# Patient Record
Sex: Female | Born: 1935 | Race: White | Hispanic: No | Smoking: Former smoker
Health system: Southern US, Community
[De-identification: ages and names within clinical notes are randomized; demographics above are authoritative.]

## PROBLEM LIST (undated history)

## (undated) DIAGNOSIS — I7300 Raynaud's syndrome without gangrene: Secondary | ICD-10-CM

## (undated) DIAGNOSIS — K8020 Calculus of gallbladder without cholecystitis without obstruction: Secondary | ICD-10-CM

## (undated) DIAGNOSIS — J90 Pleural effusion, not elsewhere classified: Secondary | ICD-10-CM

## (undated) DIAGNOSIS — M109 Gout, unspecified: Secondary | ICD-10-CM

## (undated) DIAGNOSIS — C801 Malignant (primary) neoplasm, unspecified: Secondary | ICD-10-CM

## (undated) DIAGNOSIS — C539 Malignant neoplasm of cervix uteri, unspecified: Secondary | ICD-10-CM

## (undated) DIAGNOSIS — H9190 Unspecified hearing loss, unspecified ear: Secondary | ICD-10-CM

## (undated) DIAGNOSIS — M810 Age-related osteoporosis without current pathological fracture: Secondary | ICD-10-CM

## (undated) DIAGNOSIS — E119 Type 2 diabetes mellitus without complications: Secondary | ICD-10-CM

## (undated) DIAGNOSIS — J449 Chronic obstructive pulmonary disease, unspecified: Secondary | ICD-10-CM

## (undated) DIAGNOSIS — Z9981 Dependence on supplemental oxygen: Secondary | ICD-10-CM

## (undated) HISTORY — PX: ORTHOPEDIC SURGERY: SHX850

## (undated) HISTORY — PX: OTHER SURGICAL HISTORY: SHX169

## (undated) HISTORY — PX: COLON SURGERY: SHX602

## (undated) HISTORY — DX: Type 2 diabetes mellitus without complications: E11.9

## (undated) HISTORY — DX: Raynaud's syndrome without gangrene: I73.00

## (undated) HISTORY — PX: ABDOMINAL HYSTERECTOMY: SHX81

---

## 2010-10-09 ENCOUNTER — Inpatient Hospital Stay (HOSPITAL_COMMUNITY)
Admission: EM | Admit: 2010-10-09 | Discharge: 2010-10-12 | Payer: Self-pay | Source: Home / Self Care | Attending: Internal Medicine | Admitting: Internal Medicine

## 2011-01-01 LAB — BASIC METABOLIC PANEL
BUN: 14 mg/dL (ref 6–23)
BUN: 21 mg/dL (ref 6–23)
CO2: 36 mEq/L — ABNORMAL HIGH (ref 19–32)
Calcium: 8.4 mg/dL (ref 8.4–10.5)
Chloride: 98 mEq/L (ref 96–112)
Creatinine, Ser: 0.93 mg/dL (ref 0.4–1.2)
Creatinine, Ser: 1.05 mg/dL (ref 0.4–1.2)
GFR calc Af Amer: >60 mL/min (ref >60)
GFR calc non Af Amer: 58 mL/min — ABNORMAL LOW (ref >60)
Glucose, Bld: 101 mg/dL — ABNORMAL HIGH (ref 70–99)
Glucose, Bld: 101 mg/dL — ABNORMAL HIGH (ref 70–99)
Glucose, Bld: 104 mg/dL — ABNORMAL HIGH (ref 70–99)
Potassium: 4.8 mEq/L (ref 3.5–5.1)
Sodium: 138 mEq/L (ref 135–145)

## 2011-01-01 LAB — COMPREHENSIVE METABOLIC PANEL
AST: 18 U/L (ref 0–37)
Albumin: 3.7 g/dL (ref 3.5–5.2)
BUN: 27 mg/dL — ABNORMAL HIGH (ref 6–23)
CO2: 33 mEq/L — ABNORMAL HIGH (ref 19–32)
Chloride: 92 mEq/L — ABNORMAL LOW (ref 96–112)
GFR calc non Af Amer: 42 mL/min — ABNORMAL LOW (ref >60)
Glucose, Bld: 118 mg/dL — ABNORMAL HIGH (ref 70–99)
Sodium: 137 mEq/L (ref 135–145)
Total Protein: 7.7 g/dL (ref 6.0–8.3)

## 2011-01-01 LAB — GLUCOSE, CAPILLARY
Glucose-Capillary: 103 mg/dL — ABNORMAL HIGH (ref 70–99)
Glucose-Capillary: 104 mg/dL — ABNORMAL HIGH (ref 70–99)
Glucose-Capillary: 105 mg/dL — ABNORMAL HIGH (ref 70–99)
Glucose-Capillary: 108 mg/dL — ABNORMAL HIGH (ref 70–99)
Glucose-Capillary: 110 mg/dL — ABNORMAL HIGH (ref 70–99)
Glucose-Capillary: 119 mg/dL — ABNORMAL HIGH (ref 70–99)
Glucose-Capillary: 132 mg/dL — ABNORMAL HIGH (ref 70–99)

## 2011-01-01 LAB — CBC
HCT: 46.1 % — ABNORMAL HIGH (ref 36.0–46.0)
HCT: 47.1 % — ABNORMAL HIGH (ref 36.0–46.0)
Hemoglobin: 14.5 g/dL (ref 12.0–15.0)
MCH: 29.2 pg (ref 26.0–34.0)
MCH: 29.3 pg (ref 26.0–34.0)
MCHC: 30.7 g/dL (ref 30.0–36.0)
MCV: 94.7 fL (ref 78.0–100.0)
MCV: 95.1 fL (ref 78.0–100.0)
MCV: 95.2 fL (ref 78.0–100.0)
Platelets: 140 10*3/uL — ABNORMAL LOW (ref 150–400)
Platelets: 144 10*3/uL — ABNORMAL LOW (ref 150–400)
Platelets: 173 10*3/uL (ref 150–400)
RDW: 15 % (ref 11.5–15.5)
RDW: 15.1 % (ref 11.5–15.5)
WBC: 7.2 10*3/uL (ref 4.0–10.5)
WBC: 7.8 10*3/uL (ref 4.0–10.5)

## 2011-01-01 LAB — URINE CULTURE
Colony Count: NO GROWTH
Culture: NO GROWTH

## 2011-01-01 LAB — CULTURE, RESPIRATORY W GRAM STAIN: Culture: NORMAL

## 2011-01-01 LAB — DIFFERENTIAL
Basophils Absolute: 0 10*3/uL (ref 0.0–0.1)
Eosinophils Absolute: 0.2 10*3/uL (ref 0.0–0.7)

## 2011-01-01 LAB — URINALYSIS, ROUTINE W REFLEX MICROSCOPIC
Glucose, UA: NEGATIVE mg/dL
Protein, ur: NEGATIVE mg/dL
Specific Gravity, Urine: 1.019 (ref 1.005–1.030)
Urobilinogen, UA: 1 mg/dL (ref 0.0–1.0)
pH: 5 (ref 5.0–8.0)

## 2011-01-01 LAB — HEMOGLOBIN A1C
Hgb A1c MFr Bld: 6.8 % — ABNORMAL HIGH (ref <5.7)
Mean Plasma Glucose: 148 mg/dL — ABNORMAL HIGH (ref <117)

## 2011-01-01 LAB — URINE MICROSCOPIC-ADD ON

## 2011-01-01 LAB — MAGNESIUM: Magnesium: 1.8 mg/dL (ref 1.5–2.5)

## 2012-08-20 ENCOUNTER — Encounter (HOSPITAL_COMMUNITY): Payer: Self-pay | Admitting: *Deleted

## 2012-08-20 ENCOUNTER — Emergency Department (HOSPITAL_COMMUNITY): Payer: Medicare Other

## 2012-08-20 ENCOUNTER — Inpatient Hospital Stay (HOSPITAL_COMMUNITY)
Admission: EM | Admit: 2012-08-20 | Discharge: 2012-08-22 | DRG: 194 | Disposition: A | Discharge status: Home / Self Care | Payer: Medicare Other | Attending: Internal Medicine | Admitting: Internal Medicine

## 2012-08-20 DIAGNOSIS — Z87891 Personal history of nicotine dependence: Secondary | ICD-10-CM

## 2012-08-20 DIAGNOSIS — J9621 Acute and chronic respiratory failure with hypoxia: Secondary | ICD-10-CM | POA: Diagnosis present

## 2012-08-20 DIAGNOSIS — J9610 Chronic respiratory failure, unspecified whether with hypoxia or hypercapnia: Secondary | ICD-10-CM

## 2012-08-20 DIAGNOSIS — J449 Chronic obstructive pulmonary disease, unspecified: Secondary | ICD-10-CM | POA: Diagnosis present

## 2012-08-20 DIAGNOSIS — E860 Dehydration: Secondary | ICD-10-CM | POA: Diagnosis present

## 2012-08-20 DIAGNOSIS — Z6831 Body mass index (BMI) 31.0-31.9, adult: Secondary | ICD-10-CM

## 2012-08-20 DIAGNOSIS — E119 Type 2 diabetes mellitus without complications: Secondary | ICD-10-CM | POA: Diagnosis present

## 2012-08-20 DIAGNOSIS — E785 Hyperlipidemia, unspecified: Secondary | ICD-10-CM | POA: Diagnosis present

## 2012-08-20 DIAGNOSIS — Z8541 Personal history of malignant neoplasm of cervix uteri: Secondary | ICD-10-CM

## 2012-08-20 DIAGNOSIS — J441 Chronic obstructive pulmonary disease with (acute) exacerbation: Secondary | ICD-10-CM | POA: Diagnosis present

## 2012-08-20 DIAGNOSIS — Z79899 Other long term (current) drug therapy: Secondary | ICD-10-CM

## 2012-08-20 DIAGNOSIS — E669 Obesity, unspecified: Secondary | ICD-10-CM | POA: Diagnosis present

## 2012-08-20 DIAGNOSIS — E875 Hyperkalemia: Secondary | ICD-10-CM | POA: Diagnosis present

## 2012-08-20 DIAGNOSIS — J189 Pneumonia, unspecified organism: Principal | ICD-10-CM | POA: Diagnosis present

## 2012-08-20 DIAGNOSIS — Z7982 Long term (current) use of aspirin: Secondary | ICD-10-CM

## 2012-08-20 DIAGNOSIS — M810 Age-related osteoporosis without current pathological fracture: Secondary | ICD-10-CM | POA: Diagnosis present

## 2012-08-20 DIAGNOSIS — Z90710 Acquired absence of both cervix and uterus: Secondary | ICD-10-CM

## 2012-08-20 DIAGNOSIS — Z85038 Personal history of other malignant neoplasm of large intestine: Secondary | ICD-10-CM

## 2012-08-20 DIAGNOSIS — Z9981 Dependence on supplemental oxygen: Secondary | ICD-10-CM

## 2012-08-20 HISTORY — DX: Malignant neoplasm of cervix uteri, unspecified: C53.9

## 2012-08-20 HISTORY — DX: Chronic obstructive pulmonary disease, unspecified: J44.9

## 2012-08-20 HISTORY — DX: Malignant (primary) neoplasm, unspecified: C80.1

## 2012-08-20 HISTORY — DX: Age-related osteoporosis without current pathological fracture: M81.0

## 2012-08-20 LAB — CBC WITH DIFFERENTIAL/PLATELET
Eosinophils Relative: 0 % (ref 0–5)
HCT: 41.7 % (ref 36.0–46.0)
Hemoglobin: 13.1 g/dL (ref 12.0–15.0)
Lymphocytes Relative: 5 % — ABNORMAL LOW (ref 12–46)
Lymphs Abs: 0.5 10*3/uL — ABNORMAL LOW (ref 0.7–4.0)
MCV: 86.7 fL (ref 78.0–100.0)
Monocytes Relative: 6 % (ref 3–12)
Platelets: 146 10*3/uL — ABNORMAL LOW (ref 150–400)
RBC: 4.81 MIL/uL (ref 3.87–5.11)
WBC: 9.9 10*3/uL (ref 4.0–10.5)

## 2012-08-20 LAB — COMPREHENSIVE METABOLIC PANEL
ALT: 10 U/L (ref 0–35)
Alkaline Phosphatase: 88 U/L (ref 39–117)
BUN: 27 mg/dL — ABNORMAL HIGH (ref 6–23)
CO2: 31 mEq/L (ref 19–32)
Calcium: 9.8 mg/dL (ref 8.4–10.5)
GFR calc Af Amer: 52 mL/min — ABNORMAL LOW (ref >90)
GFR calc non Af Amer: 45 mL/min — ABNORMAL LOW (ref >90)
Glucose, Bld: 147 mg/dL — ABNORMAL HIGH (ref 70–99)
Sodium: 132 mEq/L — ABNORMAL LOW (ref 135–145)

## 2012-08-20 LAB — HEPATIC FUNCTION PANEL
ALT: 11 U/L (ref 0–35)
Alkaline Phosphatase: 80 U/L (ref 39–117)
Indirect Bilirubin: 0.4 mg/dL (ref 0.3–0.9)
Total Bilirubin: 0.6 mg/dL (ref 0.3–1.2)
Total Protein: 8.1 g/dL (ref 6.0–8.3)

## 2012-08-20 MED ORDER — ALBUTEROL SULFATE (5 MG/ML) 0.5% IN NEBU
INHALATION_SOLUTION | RESPIRATORY_TRACT | Status: AC
  Filled 2012-08-20: qty 0.5

## 2012-08-20 MED ORDER — ACETAMINOPHEN 325 MG PO TABS: 650.0000 mg | ORAL_TABLET | ORAL | Status: DC | PRN

## 2012-08-20 MED ORDER — IPRATROPIUM BROMIDE 0.02 % IN SOLN
0.5000 mg | Freq: Once | RESPIRATORY_TRACT | Status: AC
  Administered 2012-08-20: 0.5 mg via RESPIRATORY_TRACT
  Filled 2012-08-20: qty 2.5

## 2012-08-20 MED ORDER — FLEET ENEMA 7-19 GM/118ML RE ENEM: 1.0000 | ENEMA | Freq: Once | RECTAL | Status: AC | PRN

## 2012-08-20 MED ORDER — INSULIN ASPART 100 UNIT/ML ~~LOC~~ SOLN: 0.0000 [IU] | Freq: Every day | SUBCUTANEOUS | Status: DC

## 2012-08-20 MED ORDER — MOXIFLOXACIN HCL IN NACL 400 MG/250ML IV SOLN
400.0000 mg | INTRAVENOUS | Status: DC
  Administered 2012-08-21: 400 mg via INTRAVENOUS
  Filled 2012-08-20: qty 250

## 2012-08-20 MED ORDER — TRAZODONE HCL 50 MG PO TABS: 25.0000 mg | ORAL_TABLET | Freq: Every evening | ORAL | Status: DC | PRN

## 2012-08-20 MED ORDER — METHYLPREDNISOLONE SODIUM SUCC 125 MG IJ SOLR
125.0000 mg | Freq: Once | INTRAMUSCULAR | Status: AC
  Administered 2012-08-20: 125 mg via INTRAVENOUS
  Filled 2012-08-20: qty 2

## 2012-08-20 MED ORDER — ALBUTEROL SULFATE (5 MG/ML) 0.5% IN NEBU
5.0000 mg | INHALATION_SOLUTION | Freq: Once | RESPIRATORY_TRACT | Status: AC
  Administered 2012-08-20: 5 mg via RESPIRATORY_TRACT
  Filled 2012-08-20: qty 1

## 2012-08-20 MED ORDER — IPRATROPIUM BROMIDE 0.02 % IN SOLN
0.5000 mg | RESPIRATORY_TRACT | Status: DC
  Administered 2012-08-20 – 2012-08-21 (×3): 0.5 mg via RESPIRATORY_TRACT
  Filled 2012-08-20 (×3): qty 2.5

## 2012-08-20 MED ORDER — NIACIN ER (ANTIHYPERLIPIDEMIC) 500 MG PO TBCR
1500.0000 mg | EXTENDED_RELEASE_TABLET | Freq: Two times a day (BID) | ORAL | Status: DC
  Administered 2012-08-21 – 2012-08-22 (×3): 1500 mg via ORAL
  Filled 2012-08-20 (×9): qty 3

## 2012-08-20 MED ORDER — ALBUTEROL SULFATE (5 MG/ML) 0.5% IN NEBU
2.5000 mg | INHALATION_SOLUTION | RESPIRATORY_TRACT | Status: DC
  Administered 2012-08-20 – 2012-08-21 (×2): 2.5 mg via RESPIRATORY_TRACT
  Filled 2012-08-20: qty 0.5

## 2012-08-20 MED ORDER — IPRATROPIUM BROMIDE 0.02 % IN SOLN
RESPIRATORY_TRACT | Status: AC
  Filled 2012-08-20: qty 2.5

## 2012-08-20 MED ORDER — ASPIRIN EC 81 MG PO TBEC
81.0000 mg | DELAYED_RELEASE_TABLET | Freq: Every day | ORAL | Status: DC
  Administered 2012-08-21 – 2012-08-22 (×2): 81 mg via ORAL
  Filled 2012-08-20 (×2): qty 1

## 2012-08-20 MED ORDER — POTASSIUM CHLORIDE IN NACL 20-0.9 MEQ/L-% IV SOLN
INTRAVENOUS | Status: DC
  Administered 2012-08-20: 1000 mL via INTRAVENOUS

## 2012-08-20 MED ORDER — INSULIN ASPART 100 UNIT/ML ~~LOC~~ SOLN
0.0000 [IU] | Freq: Three times a day (TID) | SUBCUTANEOUS | Status: DC
  Administered 2012-08-21: 5 [IU] via SUBCUTANEOUS
  Administered 2012-08-21 – 2012-08-22 (×2): 2 [IU] via SUBCUTANEOUS
  Administered 2012-08-22: 1 [IU] via SUBCUTANEOUS

## 2012-08-20 MED ORDER — POLYETHYLENE GLYCOL 3350 17 G PO PACK: 17.0000 g | PACK | Freq: Every day | ORAL | Status: DC | PRN

## 2012-08-20 MED ORDER — GUAIFENESIN ER 600 MG PO TB12
1200.0000 mg | ORAL_TABLET | Freq: Two times a day (BID) | ORAL | Status: DC
  Administered 2012-08-20 – 2012-08-22 (×4): 1200 mg via ORAL
  Filled 2012-08-20 (×4): qty 2

## 2012-08-20 MED ORDER — MOXIFLOXACIN HCL IN NACL 400 MG/250ML IV SOLN
400.0000 mg | Freq: Once | INTRAVENOUS | Status: AC
  Administered 2012-08-20: 400 mg via INTRAVENOUS
  Filled 2012-08-20: qty 250

## 2012-08-20 MED ORDER — ONDANSETRON HCL 4 MG/2ML IJ SOLN: 4.0000 mg | INTRAMUSCULAR | Status: DC | PRN

## 2012-08-20 MED ORDER — ENOXAPARIN SODIUM 30 MG/0.3ML ~~LOC~~ SOLN
30.0000 mg | SUBCUTANEOUS | Status: DC
  Administered 2012-08-21 – 2012-08-22 (×2): 30 mg via SUBCUTANEOUS
  Filled 2012-08-20 (×2): qty 0.3

## 2012-08-20 MED ORDER — BISACODYL 5 MG PO TBEC: 5.0000 mg | DELAYED_RELEASE_TABLET | Freq: Every day | ORAL | Status: DC | PRN

## 2012-08-20 MED ORDER — OMEGA-3-ACID ETHYL ESTERS 1 G PO CAPS
2.0000 g | ORAL_CAPSULE | Freq: Two times a day (BID) | ORAL | Status: DC
  Administered 2012-08-21 – 2012-08-22 (×3): 2 g via ORAL
  Filled 2012-08-20: qty 2
  Filled 2012-08-20 (×2): qty 1
  Filled 2012-08-20: qty 2

## 2012-08-20 NOTE — ED Notes (Signed)
C/o shortness of breath, thinks she may have gallstones

## 2012-08-20 NOTE — ED Notes (Signed)
Pt c/o difficulty breathing which has gradually become worse over the past 2 days. Pt states she wears O2 at home and has neb tx at home but nothing it helping. Pt also c/o productive cough.

## 2012-08-20 NOTE — ED Provider Notes (Cosign Needed)
History    This chart was scribed for Benny Lennert, MD, MD by Smitty Pluck. The patient was seen in room APA09 and the patient's care was started at 6:17PM.   CSN: [id number]  Arrival date & time 08/20/12  1753       Chief Complaint  Patient presents with  . Shortness of Breath    (Consider location/radiation/quality/duration/timing/severity/associated sxs/prior treatment) Patient is a 78 y.o. female presenting with shortness of breath. The history is provided by the patient. No language interpreter was used.  Shortness of Breath  The current episode started 3 to 5 days ago. The problem occurs continuously. The problem has been unchanged. The problem is moderate. Nothing relieves the symptoms. The symptoms are aggravated by activity. Associated symptoms include cough and shortness of breath. Pertinent negatives include no chest pain. The cough is productive.   Elexis Siedlecki is a 78 y.o. female who presents to the Emergency Department complaining of constant, moderate SOB onset 4 days ago. Pt reports that she has generalized weakness. She reports nausea and intermittent cough with white sputum. Pt uses 3L O2 at home. She has hx of gallstones.   Past Medical History  Diagnosis Date  . COPD (chronic obstructive pulmonary disease)   . Osteoporosis   . Cancer     Past Surgical History  Procedure Date  . Abdominal hysterectomy   . Orthopedic surgery   . Colon surgery     No family history on file.  History  Substance Use Topics  . Smoking status: Former Games developer  . Smokeless tobacco: Not on file  . Alcohol Use: No    OB History    Grav Para Term Preterm Abortions TAB SAB Ect Mult Living                  Review of Systems  Constitutional: Negative for fatigue.  HENT: Negative for congestion, sinus pressure and ear discharge.   Eyes: Negative for discharge.  Respiratory: Positive for cough and shortness of breath.   Cardiovascular: Negative for chest pain.    Gastrointestinal: Negative for abdominal pain and diarrhea.  Genitourinary: Negative for frequency and hematuria.  Musculoskeletal: Negative for back pain.  Skin: Negative for rash.  Neurological: Positive for weakness. Negative for seizures and headaches.  Hematological: Negative.   Psychiatric/Behavioral: Negative for hallucinations.    Allergies  Review of patient's allergies indicates no known allergies.  Home Medications  No current outpatient prescriptions on file.  BP 114/54  Pulse 88  Temp 100.5 F (38.1 C) (Oral)  Resp 22  Ht 5\' 4"  (1.626 m)  Wt 180 lb (81.647 kg)  BMI 30.90 kg/m2  SpO2 88%  Physical Exam  Nursing note and vitals reviewed. Constitutional: She is oriented to person, place, and time. She appears well-developed.  HENT:  Head: Normocephalic and atraumatic.  Eyes: Conjunctivae normal and EOM are normal. No scleral icterus.  Neck: Neck supple. No thyromegaly present.  Cardiovascular: Normal rate and regular rhythm.  Exam reveals no gallop and no friction rub.   No murmur heard. Pulmonary/Chest: No stridor. She has wheezes (minimally diffuse). She has no rales. She exhibits no tenderness.  Abdominal: She exhibits no distension. There is no tenderness. There is no rebound.  Musculoskeletal: Normal range of motion. She exhibits no edema.  Lymphadenopathy:    She has no cervical adenopathy.  Neurological: She is oriented to person, place, and time. Coordination normal.  Skin: No rash noted. No erythema.  Psychiatric: She has a normal mood  and affect. Her behavior is normal.    ED Course  Procedures (including critical care time) DIAGNOSTIC STUDIES: Oxygen Saturation is 88% on [place], low by my interpretation.    COORDINATION OF CARE: 6:19 PM Discussed ED treatment with pt  6:30 PM Ordered:    . albuterol  5 mg Nebulization Once  . ipratropium  0.5 mg Nebulization Once  . methylPREDNISolone (SOLU-MEDROL) injection  125 mg Intravenous Once        Labs Reviewed  CBC WITH DIFFERENTIAL - Abnormal; Notable for the following:    Platelets 146 (*)     Neutrophils Relative 89 (*)     Neutro Abs 8.8 (*)     Lymphocytes Relative 5 (*)     Lymphs Abs 0.5 (*)     All other components within normal limits  COMPREHENSIVE METABOLIC PANEL - Abnormal; Notable for the following:    Sodium 132 (*)     Potassium 5.3 (*)     Chloride 90 (*)     Glucose, Bld 147 (*)     BUN 27 (*)     Creatinine, Ser 1.12 (*)     GFR calc non Af Amer 45 (*)     GFR calc Af Amer 52 (*)     All other components within normal limits  LIPASE, BLOOD  CULTURE, BLOOD (ROUTINE X 2)  CULTURE, BLOOD (ROUTINE X 2)   Dg Abd Acute W/chest  08/20/2012  *RADIOLOGY REPORT*  Clinical Data: Short of breath, cough and abdominal pain.  ACUTE ABDOMEN SERIES (ABDOMEN 2 VIEW & CHEST 1 VIEW)  Comparison: None.  Findings: Bowel gas pattern does not show evidence of ileus, obstruction or free air.  There is a calcification in the right upper quadrant measuring approximately 1 cm which I think is more likely represent a gallstone than a right renal stone.  There is degenerative change of the lumbar spine but no identifiable acute bony finding.  One-view chest shows normal heart size.  The left chest is clear. There is infiltrate in the right lower lung consistent with pneumonia.  IMPRESSION: Right lower lobe pneumonia.  Normal bowel gas pattern.  1 cm gallstone versus right renal stone.   Original Report Authenticated By: Thomasenia Sales, M.D.      No diagnosis found.    MDM      The chart was scribed for me under my direct supervision.  I personally performed the history, physical, and medical decision making and all procedures in the evaluation of this patient.Benny Lennert, MD 08/20/12 2115

## 2012-08-20 NOTE — H&P (Signed)
[facility] History and Physical  Emily Owen  UJW:[id number]  DOB: 11-04-1929   DOA: 08/20/2012   PCP:   Dr. Consuella Lose in [place], [place]  Chief Complaint:  Progressive weakness and cough for one week  HPI: Emily Owen is an 78 y.o. female.  Elderly Caucasian lady with COPD and visiting relatives in the area. Has been feeling weak and not herself for about a week, and for the past 3 days has been having worsening cough productive of yellow-green sputum. She denies fever or chills, nausea or vomiting. She has been having increasing wheezing and shortness of breath not relieved by a home nebulizer.  In the emergency room she received treatment for COPD exacerbation with nebulizations and  Solu-Medrol, and improved slightly, however chest x-ray revealed evidence of right lower lobe pneumonia, and hospitalist service was called.  Patient reports extreme shortness of breath with even slight exertion. She uses 3 L of home oxygen  She takes Lotensin to protect her kidneys secondary to her diabetes, which is controlled on diet. She denies hypertension However she has not taken her medications in about a week because she has felt very sick.  She had failed biliary stent placement for gallstones in April; it was an attempt to avoid cholecystectomy. Did not pass the stone.  Rewiew of Systems:   All systems negative except as marked bold or noted in the HPI;  Constitutional: Negative for fever and chills. ; Malaise Eyes: Negative for eye pain, redness and discharge. ;  ENMT: Negative for ear pain, hoarseness, nasal congestion, sinus pressure and sore throat. ;  Cardiovascular: Negative for chest pain, palpitations,  and peripheral edema. ; dyspnea Respiratory: Positive for cough, hemoptysis, wheezing;  Gastrointestinal: Negative for nausea, vomiting, diarrhea, constipation, abdominal pain, melena, blood in stool, hematemesis, jaundice and rectal bleeding.  unusual weight loss..   Genitourinary: Negative for frequency, dysuria, incontinence,flank pain and hematuria; Musculoskeletal: Negative for back pain and neck pain. Negative for swelling and trauma.;  Skin: . Negative for pruritus, rash, abrasions, bruising and skin lesion.; ulcerations Neuro: Negative for headache, lightheadedness and neck stiffness. Negative for weakness, altered level of consciousness , altered mental status, extremity weakness, burning feet, involuntary movement, seizure and syncope.  Psych: negative for anxiety, depression, insomnia, tearfulness, panic attacks, hallucinations, paranoia, suicidal or homicidal ideation    Past Medical History  Diagnosis Date  . COPD (chronic obstructive pulmonary disease)   . Osteoporosis   . Cancer     colon  . Cancer of cervix     cervix    Past Surgical History  Procedure Date  . Abdominal hysterectomy   . Orthopedic surgery   . Colon surgery     Medications:  HOME MEDS: Prior to Admission medications   Medication Sig Start Date End Date Taking? Authorizing Provider  aspirin EC 81 MG tablet Take 81 mg by mouth daily.   Yes Historical Provider, MD  benazepril (LOTENSIN) 40 MG tablet Take 40 mg by mouth daily.   Yes Historical Provider, MD  fish oil-omega-3 fatty acids 1000 MG capsule Take 2 g by mouth 2 (two) times daily.   Yes Historical Provider, MD  Ipratropium-Albuterol (COMBIVENT RESPIMAT) 20-100 MCG/ACT AERS respimat Inhale 1 puff into the lungs every 6 (six) hours as needed. For shortness of breath   Yes Historical Provider, MD  niacin 500 MG tablet Take 1,500 mg by mouth 2 (two) times daily.   Yes Historical Provider, MD     Allergies:  No Known Allergies  Social History:   reports that she has quit smoking. She does not have any smokeless tobacco history on file. She reports that she does not drink alcohol. Her drug history not on file.  Family History: Family History  Problem Relation Age of Onset  .  Hypertension Mother   . Heart disease Mother   . Diabetes Sister   . Diabetes Sister   . Diabetes Sister   . Diabetes Sister      Physical Exam: Filed Vitals:   08/20/12 1758 08/20/12 1855 08/20/12 2017 08/20/12 2121  BP: 114/54  122/67 120/58  Pulse: 88  90 85  Temp: 100.5 F (38.1 C)  100.2 F (37.9 C) 99.3 F (37.4 C)  TempSrc: Oral  Oral Oral  Resp: 22  20 20   Height: 5\' 4"  (1.626 m)     Weight: 81.647 kg (180 lb)     SpO2: 88% 96% 91% 91%   Blood pressure 120/58, pulse 85, temperature 99.3 F (37.4 C), temperature source Oral, resp. rate 20, height 5\' 4"  (1.626 m), weight 81.647 kg (180 lb), SpO2 91.00%.  GEN:  Pleasant elderly Caucasian lady sitting up in the stretcher respiratory distress precipitated by moving around in the; cooperative with exam PSYCH:  alert and oriented x4; does not appear anxious or depressed; affect is appropriate. HEENT: Mucous membranes pink, dry, and anicteric; PERRLA; EOM intact; no thyromegaly or carotid bruit; no JVD; Breasts:: Not examined CHEST WALL: CHEST: Expiratory wheezes and rhonchi; coarse crackles at right base HEART: Regular rate and rhythm; no murmurs rubs or gallops BACK: Mild kyphosis no scoliosis; no CVA tenderness ABDOMEN: Obese, soft non-tender; no masses, no organomegaly, normal abdominal bowel sounds;  no intertriginous candida. Rectal Exam: Not done EXTREMITIES:  age-appropriate arthropathy of the hands and knees; no edema; no ulcerations. Discoloration and deformity of some nails of fingers and toes Genitalia: not examined PULSES: 2+ and symmetric SKIN: Normal hydration no rash or ulceration CNS: Cranial nerves 2-12 grossly intact no focal lateralizing neurologic deficit   Labs on Admission:  Basic Metabolic Panel:  Lab 08/20/12 1914  NA 132*  K 5.3*  CL 90*  CO2 31  GLUCOSE 147*  BUN 27*  CREATININE 1.12*  CALCIUM 9.8  MG --  PHOS --   Liver Function Tests:  Lab 08/20/12 1845  AST 14  ALT 10    ALKPHOS 88  BILITOT 0.6  PROT 8.1  ALBUMIN 3.5    Lab 08/20/12 1845  LIPASE 25  AMYLASE --   No results found for this basename: AMMONIA:5 in the last 168 hours CBC:  Lab 08/20/12 1845  WBC 9.9  NEUTROABS 8.8*  HGB 13.1  HCT 41.7  MCV 86.7  PLT 146*   Cardiac Enzymes: No results found for this basename: CKTOTAL:5,CKMB:5,CKMBINDEX:5,TROPONINI:5 in the last 168 hours BNP: No components found with this basename: POCBNP:5 D-dimer: No components found with this basename: D-DIMER:5 CBG: No results found for this basename: GLUCAP:5 in the last 168 hours  Radiological Exams on Admission: Dg Abd Acute W/chest  08/20/2012  *RADIOLOGY REPORT*  Clinical Data: Short of breath, cough and abdominal pain.  ACUTE ABDOMEN SERIES (ABDOMEN 2 VIEW & CHEST 1 VIEW)  Comparison: None.  Findings: Bowel gas pattern does not show evidence of ileus, obstruction or free air.  There is a calcification in the right upper quadrant measuring approximately 1 cm which I think is more likely represent a gallstone than a right renal stone.  There is degenerative change of the lumbar spine  but no identifiable acute bony finding.  One-view chest shows normal heart size.  The left chest is clear. There is infiltrate in the right lower lung consistent with pneumonia.  IMPRESSION: Right lower lobe pneumonia.  Normal bowel gas pattern.  1 cm gallstone versus right renal stone.   Original Report Authenticated By: Thomasenia Sales, M.D.       Assessment/Plan Present on Admission:  .CAP (community acquired pneumonia) .Diabetes type 2, controlled on diet .COPD (chronic obstructive pulmonary disease) .Hyperlipidemia Dehydration Hyperkalemia  PLAN: We'll admit this lady for treatment of community-acquired pneumonia, and will get a followup chest x-ray to ensure resolution, since with her tobacco history and advanced age it may be more sinister underlying pathology.  Nebulizations for COPD but will withhold steroids  as much as possible, because of infection and diabetes  Will hold her Lotensin because of her hyperkalemia, and hydrate gently  Monitor blood sugars while in hospital  Other plans as per orders.  Code Status: FULL CODE...,. To be reverified  Family Communication: Multiple sisters and niece in room with patient Disposition Plan: Home in a few days when improved and stable; may need to start physical therapy in a day or 2    Emily Owen Nocturnist [facility] Pager [phone]   08/20/2012, 10:01 PM

## 2012-08-21 LAB — GLUCOSE, CAPILLARY
Glucose-Capillary: 186 mg/dL — ABNORMAL HIGH (ref 70–99)
Glucose-Capillary: 189 mg/dL — ABNORMAL HIGH (ref 70–99)
Glucose-Capillary: 198 mg/dL — ABNORMAL HIGH (ref 70–99)

## 2012-08-21 LAB — URINALYSIS, ROUTINE W REFLEX MICROSCOPIC
Glucose, UA: NEGATIVE mg/dL
Protein, ur: 30 mg/dL — AB

## 2012-08-21 LAB — BASIC METABOLIC PANEL
BUN: 32 mg/dL — ABNORMAL HIGH (ref 6–23)
CO2: 28 mEq/L (ref 19–32)
Glucose, Bld: 213 mg/dL — ABNORMAL HIGH (ref 70–99)
Potassium: 4.7 mEq/L (ref 3.5–5.1)
Sodium: 134 mEq/L — ABNORMAL LOW (ref 135–145)

## 2012-08-21 LAB — CBC
Hemoglobin: 12.6 g/dL (ref 12.0–15.0)
MCH: 27.4 pg (ref 26.0–34.0)
MCHC: 31.9 g/dL (ref 30.0–36.0)
MCV: 85.9 fL (ref 78.0–100.0)
RBC: 4.6 MIL/uL (ref 3.87–5.11)

## 2012-08-21 LAB — URINE MICROSCOPIC-ADD ON

## 2012-08-21 LAB — HEMOGLOBIN A1C: Hgb A1c MFr Bld: 6.4 % — ABNORMAL HIGH (ref <5.7)

## 2012-08-21 MED ORDER — SODIUM CHLORIDE 0.9 % IJ SOLN
INTRAMUSCULAR | Status: AC
  Administered 2012-08-21: 3 mL
  Filled 2012-08-21: qty 3

## 2012-08-21 MED ORDER — SODIUM CHLORIDE 0.9 % IJ SOLN
INTRAMUSCULAR | Status: AC
  Filled 2012-08-21: qty 3

## 2012-08-21 MED ORDER — SODIUM CHLORIDE 0.9 % IV SOLN
INTRAVENOUS | Status: DC
  Administered 2012-08-21: 08:00:00 via INTRAVENOUS

## 2012-08-21 MED ORDER — ALBUTEROL SULFATE (5 MG/ML) 0.5% IN NEBU
2.5000 mg | INHALATION_SOLUTION | RESPIRATORY_TRACT | Status: DC | PRN
  Administered 2012-08-21: 2.5 mg via RESPIRATORY_TRACT
  Filled 2012-08-21 (×2): qty 0.5

## 2012-08-21 MED ORDER — BIOTENE DRY MOUTH MT LIQD
15.0000 mL | Freq: Two times a day (BID) | OROMUCOSAL | Status: DC
  Administered 2012-08-21 – 2012-08-22 (×3): 15 mL via OROMUCOSAL

## 2012-08-21 NOTE — Plan of Care (Signed)
Problem: Consults Goal: Pneumonia Patient Education See Patient Educatio Module for education specifics.  Patient aware of diagnosis, only complaint is SOB Goal: Skin Care Protocol Initiated - if Braden Score 18 or less If consults are not indicated, leave blank or document N/A  Outcome: Progressing Redness to bilat buttocks on admission, small reddened area to Right shin, skin mottled  Problem: Phase I Progression Outcomes Goal: OOB as tolerated unless otherwise ordered Outcome: Progressing oob with minimal assist with lines and poles Goal: First antibiotic given within 6hrs of admit Outcome: Progressing Avelox given in ED Goal: Code status addressed with pt/family Outcome: Completed/Met Date Met:  08/21/12 Patient remains a full code Goal: Initial discharge plan identified Outcome: Progressing Patient is an active 8 yr. old.  Still drives a car, meets with best friend daily.

## 2012-08-21 NOTE — Progress Notes (Signed)
Pt transferred to room 306. Report given to [facility]. VSS.

## 2012-08-21 NOTE — Progress Notes (Signed)
[facility]             Progress Note   Subjective: Patient reports feeling better today. Cough is improving.  Shortness of breath is also improving.  Wheezing is better than yesterday.  Objective: Vital signs in last 24 hours: Temp:  [97.3 F (36.3 C)-100.5 F (38.1 C)] 97.3 F (36.3 C) (10/31 0642) Pulse Rate:  [68-90] 68  (10/31 0642) Resp:  [18-22] 18  (10/31 0642) BP: (112-142)/(52-75) 115/72 mmHg (10/31 0642) SpO2:  [88 %-98 %] 98 % (10/31 0741) Weight:  [81.647 kg (180 lb)-81.9 kg (180 lb 8.9 oz)] 81.9 kg (180 lb 8.9 oz) (10/30 2308) Weight change:  Last BM Date: 08/20/12  Intake/Output from previous day: 10/30 0701 - 10/31 0700 In: 52.5 [I.V.:52.5] Out: 200 [Urine:200]     Physical Exam: General: Alert, awake, oriented x3, in no acute distress. HEENT: No bruits, no goiter. Heart: Regular rate and rhythm, without murmurs, rubs, gallops. Lungs: crackles at right base Abdomen: Soft, nontender, nondistended, positive bowel sounds. Extremities: No clubbing cyanosis or edema with positive pedal pulses. Neuro: Grossly intact, nonfocal.    Lab Results: Basic Metabolic Panel:  Basename 08/21/12 0439 08/20/12 1845  NA 134* 132*  K 4.7 5.3*  CL 95* 90*  CO2 28 31  GLUCOSE 213* 147*  BUN 32* 27*  CREATININE 1.06 1.12*  CALCIUM 8.8 9.8  MG -- --  PHOS -- --   Liver Function Tests:  [facility] 08/20/12 2256 08/20/12 1845  AST 15 14  ALT 11 10  ALKPHOS 80 88  BILITOT 0.6 0.6  PROT 8.1 8.1  ALBUMIN 3.5 3.5    Basename 08/20/12 1845  LIPASE 25  AMYLASE --   No results found for this basename: AMMONIA:2 in the last 72 hours CBC:  Basename 08/21/12 0439 08/20/12 1845  WBC 8.3 9.9  NEUTROABS -- 8.8*  HGB 12.6 13.1  HCT 39.5 41.7  MCV 85.9 86.7  PLT 142* 146*   Cardiac Enzymes: No results found for this basename: CKTOTAL:3,CKMB:3,CKMBINDEX:3,TROPONINI:3 in the last 72 hours BNP: No results found for this basename: PROBNP:3 in the last  72 hours D-Dimer: No results found for this basename: DDIMER:2 in the last 72 hours CBG:  Basename 08/21/12 0746 08/20/12 2355  GLUCAP 198* 189*   Hemoglobin A1C: No results found for this basename: HGBA1C in the last 72 hours Fasting Lipid Panel: No results found for this basename: CHOL,HDL,LDLCALC,TRIG,CHOLHDL,LDLDIRECT in the last 72 hours Thyroid Function Tests: No results found for this basename: TSH,T4TOTAL,FREET4,T3FREE,THYROIDAB in the last 72 hours Anemia Panel: No results found for this basename: VITAMINB12,FOLATE,FERRITIN,TIBC,IRON,RETICCTPCT in the last 72 hours Coagulation: No results found for this basename: LABPROT:2,INR:2 in the last 72 hours Urine Drug Screen: Drugs of Abuse  No results found for this basename: labopia, cocainscrnur, labbenz, amphetmu, thcu, labbarb    Alcohol Level: No results found for this basename: ETH:2 in the last 72 hours Urinalysis:  Basename 08/20/12 2341  COLORURINE YELLOW  LABSPEC >1.030*  PHURINE 5.5  GLUCOSEU NEGATIVE  HGBUR MODERATE*  BILIRUBINUR SMALL*  KETONESUR 40*  PROTEINUR 30*  UROBILINOGEN 0.2  NITRITE NEGATIVE  LEUKOCYTESUR NEGATIVE    Recent Results (from the past 240 hour(s))  CULTURE, BLOOD (ROUTINE X 2)     Status: Normal (Preliminary result)   Collection Time   08/20/12  8:50 PM      Component Value Range Status Comment   Specimen Description Blood RIGHT ARM   Final    Special Requests BOTTLES DRAWN AEROBIC AND  ANAEROBIC 8 CC EACH   Final    Culture NO GROWTH 1 DAY   Final    Report Status PENDING   Incomplete   CULTURE, BLOOD (ROUTINE X 2)     Status: Normal (Preliminary result)   Collection Time   08/20/12  8:55 PM      Component Value Range Status Comment   Specimen Description Blood LEFT ARM   Final    Special Requests BOTTLES DRAWN AEROBIC AND ANAEROBIC 8 CC EACH   Final    Culture NO GROWTH 1 DAY   Final    Report Status PENDING   Incomplete   MRSA PCR SCREENING     Status: Normal   Collection  Time   08/20/12 11:01 PM      Component Value Range Status Comment   MRSA by PCR NEGATIVE  NEGATIVE Final     Studies/Results: Dg Abd Acute W/chest  08/20/2012  *RADIOLOGY REPORT*  Clinical Data: Short of breath, cough and abdominal pain.  ACUTE ABDOMEN SERIES (ABDOMEN 2 VIEW & CHEST 1 VIEW)  Comparison: None.  Findings: Bowel gas pattern does not show evidence of ileus, obstruction or free air.  There is a calcification in the right upper quadrant measuring approximately 1 cm which I think is more likely represent a gallstone than a right renal stone.  There is degenerative change of the lumbar spine but no identifiable acute bony finding.  One-view chest shows normal heart size.  The left chest is clear. There is infiltrate in the right lower lung consistent with pneumonia.  IMPRESSION: Right lower lobe pneumonia.  Normal bowel gas pattern.  1 cm gallstone versus right renal stone.   Original Report Authenticated By: Thomasenia Sales, M.D.     Medications: Scheduled Meds:   . albuterol  5 mg Nebulization Once  . albuterol      . antiseptic oral rinse  15 mL Mouth Rinse BID  . aspirin EC  81 mg Oral Daily  . enoxaparin (LOVENOX) injection  30 mg Subcutaneous Q24H  . guaiFENesin  1,200 mg Oral BID  . insulin aspart  0-5 Units Subcutaneous QHS  . insulin aspart  0-9 Units Subcutaneous TID WC  . ipratropium      . ipratropium  0.5 mg Nebulization Once  . ipratropium  0.5 mg Nebulization Q4H WA  . methylPREDNISolone (SOLU-MEDROL) injection  125 mg Intravenous Once  . moxifloxacin  400 mg Intravenous Once  . moxifloxacin  400 mg Intravenous Q24H  . niacin  1,500 mg Oral BID  . omega-3 acid ethyl esters  2 g Oral BID  . DISCONTD: albuterol  2.5 mg Nebulization Q4H WA   Continuous Infusions:   . DISCONTD: sodium chloride 50 mL/hr at 08/21/12 0808  . DISCONTD: 0.9 % NaCl with KCl 20 mEq / L 50 mL/hr at 08/21/12 0100   PRN Meds:.acetaminophen, albuterol, bisacodyl, ondansetron (ZOFRAN)  IV, polyethylene glycol, sodium phosphate, traZODone  Assessment/Plan:  Principal Problem:  *CAP (community acquired pneumonia) Active Problems:  COPD (chronic obstructive pulmonary disease)  Diabetes type 2, controlled on diet  Hyperlipidemia  1. Community acquired pneumonia.  Patient has been started on antibiotics.  She did have low grade fever on admission, but that has since resolved. She was ambulated today and quickly desaturated and became tachycardic. We will continue with current therapies and re assess pulmonary status in the morning.  I suspect that she will probably be able to discharge home tomorrow.  She will need a repeat chest  xray in the next few weeks.  If there is any persistent abnormality found, then a CT of the chest would be warranted.  2. COPD, oxygen dependant on 3L.  She does not have evidence of wheezing.  Will change nebulizers to prn. Continue current treatments.   Time spent coordinating care:   LOS: 1 day   Verlie Liotta [facility] Pager: [phone] 08/21/2012, 10:36 AM

## 2012-08-21 NOTE — Progress Notes (Signed)
Pt ambulated in hall at [facility] request. Pt ambulated approximately 129ft, holding on to iv pole to steady self, on 3l [place]. O2 sat dropped to 83%, and heart rate rose to 114 while ambulating. At rest O2 sat 96%, HR 90 on 3l [place]. Pt's daughter states that pt does not use a cane or walker, but always uses a shopping cart to steady herself when ambulating in stores.

## 2012-08-21 NOTE — Care Management Note (Signed)
    Page 1 of 1   08/22/2012     11:34:54 AM   CARE MANAGEMENT NOTE 08/22/2012  Patient:  Emily Owen, Emily Owen   Account Number:  [id number]  Date Initiated:  08/21/2012  Documentation initiated by:  Rosemary Holms  Subjective/Objective Assessment:   Pt admitted with PNA. Visiting family. From [employer]. On home O2 w/ [employer]. [employer] has supplied her O2 for DC with family prior to her returning home to [place].     Action/Plan:   Anticipated DC Date:  08/22/2012   Anticipated DC Plan:  HOME/SELF CARE      DC Planning Services  CM consult      Choice offered to / List presented to:             Status of service:  Completed, signed off Medicare Important Message given?  YES (If response is "NO", the following Medicare IM given date fields will be blank) Date Medicare IM given:  08/22/2012 Date Additional Medicare IM given:    Discharge Disposition:  HOME/SELF CARE  Per UR Regulation:    If discussed at Long Length of Stay Meetings, dates discussed:    Comments:  08/22/12 1132 Arlyss Queen, RN BSN CM Pt to be discharged home today. Pt has O2 with [employer]. Pt will be discharged home with daughter, Irving Burton, and will return to [place] over the weekend. Pt has no HH or DME needs noted.  08/21/12 Rosemary Holms RN BSN CM

## 2012-08-21 NOTE — Progress Notes (Signed)
UR Chart Review Completed  

## 2012-08-21 NOTE — Clinical Documentation Improvement (Signed)
RESPIRATORY FAILURE DOCUMENTATION CLARIFICATION QUERY   THIS DOCUMENT IS NOT A PERMANENT PART OF THE MEDICAL RECORD  TO RESPOND TO THE THIS QUERY, FOLLOW THE INSTRUCTIONS BELOW:  1. If needed, update documentation for the patient's encounter via the notes activity.  2. Access this query again and click edit on the In [employer].  3. After updating, or not, click F2 to complete all highlighted (required) fields concerning your review. Select "additional documentation in the medical record" OR "no additional documentation provided".  4. Click Sign note button.  5. The deficiency will fall out of your In Basket *Please let us know if you are not able to complete this workflow by phone or e-mail (listed below).  Please update your documentation within the medical record to reflect your response to this query.                                                                                    08/21/12  Dear Dr. Kerry Hough Marton Redwood,  In a better effort to capture your patient's severity of illness, reflect appropriate length of stay and utilization of resources, a review of the patient medical record has revealed the following indicators.  Based on your clinical judgment, please clarify and document in a progress note and/or discharge summary the clinical condition associated with the following supporting information:  In responding to this query please exercise your independent judgment.  The fact that a query is asked, does not imply that any particular answer is desired or expected.  Please clarify Respiratory Status  Possible Clinical Conditions?  Acute Respiratory Failure Acute on Chronic Respiratory Failure Chronic Respiratory Failure Other Condition________________ Cannot Clinically Determine   Clinical Information:   Risk Factors: Admitted w/community acquired pneumonia with a history of COPD Requires home oxygen at 3L/m [place] Oxygen saturations dropped to 83% on 3L/m per [place] while  ambulating  Signs&Symptoms: Shortness of breath Cough  Respiratory Treatment: Oxygen @ 3L/m via [place] Keep O2 sats >92% Proventil 5mg  nebulizer x 1 dose 10/30 Atrovent 0.5mg  nebulizer x 1 dose 10/30 then q4h while awake and 2.5mg  q4h prn Pulse Ox with VS  You may use possible, probable, or suspect with inpatient documentation. possible, probable, suspected diagnoses MUST be documented at the time of discharge  Reviewed: additional documentation in the medical record  Thank You,  Debora T Williams RN, MSN Clinical Documentation Specialist: Office# [phone] Health Information Management [place]

## 2012-08-22 DIAGNOSIS — J9610 Chronic respiratory failure, unspecified whether with hypoxia or hypercapnia: Secondary | ICD-10-CM

## 2012-08-22 DIAGNOSIS — J9621 Acute and chronic respiratory failure with hypoxia: Secondary | ICD-10-CM | POA: Diagnosis present

## 2012-08-22 LAB — GLUCOSE, CAPILLARY

## 2012-08-22 MED ORDER — GUAIFENESIN ER 600 MG PO TB12: 1200.0000 mg | ORAL_TABLET | Freq: Two times a day (BID) | ORAL | Status: DC

## 2012-08-22 MED ORDER — MOXIFLOXACIN HCL 400 MG PO TABS: 400.0000 mg | ORAL_TABLET | Freq: Every day | ORAL | Status: DC

## 2012-08-22 NOTE — Progress Notes (Signed)
Discharge SUmmary: a/o.vss. Saline lock removed. Discharge instructions given. Prescriptions given. Pt verbalized understanding of instructions. Left floor via wheelchair with nursing staff and family members.

## 2012-08-22 NOTE — Progress Notes (Signed)
PHARMACIST - PHYSICIAN COMMUNICATION DR:   Kerry Hough CONCERNING: Antibiotic IV to Oral Route Change Policy  RECOMMENDATION: This patient is receiving Avelox by the intravenous route.  Based on criteria approved by the Pharmacy and Therapeutics Committee, the antibiotic(s) is/are being converted to the equivalent oral dose form(s).   DESCRIPTION: These criteria include:  Patient being treated for a respiratory tract infection, urinary tract infection, or cellulitis  The patient is not neutropenic and does not exhibit a GI malabsorption state  The patient is eating (either orally or via tube) and/or has been taking other orally administered medications for a least 24 hours  The patient is improving clinically and has a Tmax < 100.5  If you have questions about this conversion, please contact the Pharmacy Department  [x]   [phone] )  [facility] []   [phone] )  [facility]  []   [phone] )  [facility] []   [phone] )  [facility]    Junita Push, PharmD, BCPS 08/22/2012@11 :05 AM

## 2012-08-22 NOTE — Discharge Summary (Signed)
Physician Discharge Summary  Emily Owen ZOX:[id number] DOB: October 01, 1930 DOA: 08/20/2012  PCP: No primary provider on file.  Admit date: 08/20/2012 Discharge date: 08/22/2012  Time spent: 40 minutes  Recommendations for Outpatient Follow-up:  1. Follow up with primary care doctor in 2-3 weeks with chest xray to ensure resolution of pneumonia  Discharge Diagnoses:  Principal Problem:  *CAP (community acquired pneumonia) Active Problems:  COPD (chronic obstructive pulmonary disease)  Diabetes type 2, controlled on diet  Hyperlipidemia  Chronic respiratory failure   Discharge Condition: improved  Diet recommendation: low salt, low carb  Filed Weights   08/20/12 2308 08/21/12 1702 08/22/12 0546  Weight: 81.9 kg (180 lb 8.9 oz) 82.4 kg (181 lb 10.5 oz) 82.3 kg (181 lb 7 oz)    History of present illness:  Emily Owen is an 78 y.o. female. Elderly Caucasian lady with COPD and visiting relatives in the area. Has been feeling weak and not herself for about a week, and for the past 3 days has been having worsening cough productive of yellow-green sputum. She denies fever or chills, nausea or vomiting. She has been having increasing wheezing and shortness of breath not relieved by a home nebulizer.  In the emergency room she received treatment for COPD exacerbation with nebulizations and Solu-Medrol, and improved slightly, however chest x-ray revealed evidence of right lower lobe pneumonia, and hospitalist service was called.  Patient reports extreme shortness of breath with even slight exertion.  She uses 3 L of home oxygen  She takes Lotensin to protect her kidneys secondary to her diabetes, which is controlled on diet. She denies hypertension However she has not taken her medications in about a week because she has felt very sick.  She had failed biliary stent placement for gallstones in April; it was an attempt to avoid cholecystectomy. Did not pass the stone.   Hospital  Course:  This lady was admitted to the hospital with progressive shortness of breath. She was found to have a right lower lobe pneumonia on chest xray.  She was started on IV avelox and given nebulization treatments for copd.  Through her hospital course she quickly improved. She has chronic resp failure due to copd and is on 3L of oxygen 24hr/day.  She has been maintained on this level and is doing well.  She was ambulated in the halls today and was able to maintain her saturation above 90%.  We will transition her to oral antibiotics. She is felt stable to discharge home today.  She will need repeat chest xray in 2-3 weeks to ensure clearing of pneumonia. The remainder of her medical issues have remained stable.  Procedures:  none  Consultations:  none  Discharge Exam: Filed Vitals:   08/21/12 1702 08/21/12 2041 08/22/12 0546 08/22/12 1049  BP: 120/72 134/81 103/63 119/71  Pulse: 72 69 58 59  Temp: 97.5 F (36.4 C) 98.1 F (36.7 C) 97.5 F (36.4 C) 97.7 F (36.5 C)  TempSrc: Oral Oral Oral Oral  Resp: 18 16 18 18   Height:      Weight: 82.4 kg (181 lb 10.5 oz)  82.3 kg (181 lb 7 oz)   SpO2: 94% 95% 92% 93%    General: NAD Cardiovascular: s1, s2, rrr Respiratory: crackles at right base  Discharge Instructions  Discharge Orders    Future Orders Please Complete By Expires   Diet - low sodium heart healthy      Increase activity slowly      Call MD for:  temperature >100.4      Call MD for:  difficulty breathing, headache or visual disturbances          Medication List     As of 08/22/2012  2:29 PM    TAKE these medications         aspirin EC 81 MG tablet   Take 81 mg by mouth daily.      benazepril 40 MG tablet   Commonly known as: LOTENSIN   Take 40 mg by mouth daily.      COMBIVENT RESPIMAT 20-100 MCG/ACT Aers respimat   Generic drug: Ipratropium-Albuterol   Inhale 1 puff into the lungs every 6 (six) hours as needed. For shortness of breath      fish  oil-omega-3 fatty acids 1000 MG capsule   Take 2 g by mouth 2 (two) times daily.      guaiFENesin 600 MG 12 hr tablet   Commonly known as: MUCINEX   Take 2 tablets (1,200 mg total) by mouth 2 (two) times daily.      moxifloxacin 400 MG tablet   Commonly known as: AVELOX   Take 1 tablet (400 mg total) by mouth daily at 8 pm.      niacin 500 MG tablet   Take 1,500 mg by mouth 2 (two) times daily.           Follow-up Information    Follow up with Dr. Pauletta Browns in 2-3 weeks. (Repeat chest xray in 3 weeks to confirm resolution)           The results of significant diagnostics from this hospitalization (including imaging, microbiology, ancillary and laboratory) are listed below for reference.    Significant Diagnostic Studies: Dg Abd Acute W/chest  08/20/2012  *RADIOLOGY REPORT*  Clinical Data: Short of breath, cough and abdominal pain.  ACUTE ABDOMEN SERIES (ABDOMEN 2 VIEW & CHEST 1 VIEW)  Comparison: None.  Findings: Bowel gas pattern does not show evidence of ileus, obstruction or free air.  There is a calcification in the right upper quadrant measuring approximately 1 cm which I think is more likely represent a gallstone than a right renal stone.  There is degenerative change of the lumbar spine but no identifiable acute bony finding.  One-view chest shows normal heart size.  The left chest is clear. There is infiltrate in the right lower lung consistent with pneumonia.  IMPRESSION: Right lower lobe pneumonia.  Normal bowel gas pattern.  1 cm gallstone versus right renal stone.   Original Report Authenticated By: Thomasenia Sales, M.D.     Microbiology: Recent Results (from the past 240 hour(s))  CULTURE, BLOOD (ROUTINE X 2)     Status: Normal (Preliminary result)   Collection Time   08/20/12  8:50 PM      Component Value Range Status Comment   Specimen Description BLOOD RIGHT ARM   Final    Special Requests BOTTLES DRAWN AEROBIC AND ANAEROBIC 8 CC EACH   Final    Culture NO  GROWTH 2 DAYS   Final    Report Status PENDING   Incomplete   CULTURE, BLOOD (ROUTINE X 2)     Status: Normal (Preliminary result)   Collection Time   08/20/12  8:55 PM      Component Value Range Status Comment   Specimen Description BLOOD LEFT ARM   Final    Special Requests BOTTLES DRAWN AEROBIC AND ANAEROBIC 8 CC EACH   Final    Culture NO GROWTH 2 DAYS  Final    Report Status PENDING   Incomplete   MRSA PCR SCREENING     Status: Normal   Collection Time   08/20/12 11:01 PM      Component Value Range Status Comment   MRSA by PCR NEGATIVE  NEGATIVE Final      Labs: Basic Metabolic Panel:  Lab 08/21/12 1610 08/20/12 1845  NA 134* 132*  K 4.7 5.3*  CL 95* 90*  CO2 28 31  GLUCOSE 213* 147*  BUN 32* 27*  CREATININE 1.06 1.12*  CALCIUM 8.8 9.8  MG -- --  PHOS -- --   Liver Function Tests:  Lab 08/20/12 2256 08/20/12 1845  AST 15 14  ALT 11 10  ALKPHOS 80 88  BILITOT 0.6 0.6  PROT 8.1 8.1  ALBUMIN 3.5 3.5    Lab 08/20/12 1845  LIPASE 25  AMYLASE --   No results found for this basename: AMMONIA:5 in the last 168 hours CBC:  Lab 08/21/12 0439 08/20/12 1845  WBC 8.3 9.9  NEUTROABS -- 8.8*  HGB 12.6 13.1  HCT 39.5 41.7  MCV 85.9 86.7  PLT 142* 146*   Cardiac Enzymes: No results found for this basename: CKTOTAL:5,CKMB:5,CKMBINDEX:5,TROPONINI:5 in the last 168 hours BNP: BNP (last 3 results) No results found for this basename: PROBNP:3 in the last 8760 hours CBG:  Lab 08/22/12 1149 08/22/12 0723 08/21/12 2040 08/21/12 1638 08/21/12 1142  GLUCAP 125* 152* 186* 183* 255*       Signed:  MEMON,JEHANZEB  [facility] 08/22/2012, 2:29 PM

## 2012-08-22 NOTE — Progress Notes (Signed)
Pt ambulated in half of hallway with O2 on 3L/[place]. sats 93% . No complaints while ambulating.

## 2012-08-26 LAB — CULTURE, BLOOD (ROUTINE X 2): Culture: NO GROWTH

## 2013-12-31 DIAGNOSIS — E119 Type 2 diabetes mellitus without complications: Secondary | ICD-10-CM | POA: Diagnosis not present

## 2013-12-31 DIAGNOSIS — E782 Mixed hyperlipidemia: Secondary | ICD-10-CM | POA: Diagnosis not present

## 2014-01-07 DIAGNOSIS — E119 Type 2 diabetes mellitus without complications: Secondary | ICD-10-CM | POA: Diagnosis not present

## 2014-01-07 DIAGNOSIS — Z6832 Body mass index (BMI) 32.0-32.9, adult: Secondary | ICD-10-CM | POA: Diagnosis not present

## 2014-01-07 DIAGNOSIS — E782 Mixed hyperlipidemia: Secondary | ICD-10-CM | POA: Diagnosis not present

## 2014-01-07 DIAGNOSIS — M810 Age-related osteoporosis without current pathological fracture: Secondary | ICD-10-CM | POA: Diagnosis not present

## 2014-01-10 DIAGNOSIS — Z23 Encounter for immunization: Secondary | ICD-10-CM | POA: Diagnosis not present

## 2014-03-16 DIAGNOSIS — M810 Age-related osteoporosis without current pathological fracture: Secondary | ICD-10-CM | POA: Diagnosis not present

## 2014-03-16 LAB — HM DEXA SCAN

## 2014-05-05 DIAGNOSIS — R0609 Other forms of dyspnea: Secondary | ICD-10-CM | POA: Diagnosis not present

## 2014-05-05 DIAGNOSIS — J449 Chronic obstructive pulmonary disease, unspecified: Secondary | ICD-10-CM | POA: Diagnosis not present

## 2014-05-05 DIAGNOSIS — R609 Edema, unspecified: Secondary | ICD-10-CM | POA: Diagnosis not present

## 2014-05-05 DIAGNOSIS — M069 Rheumatoid arthritis, unspecified: Secondary | ICD-10-CM | POA: Diagnosis not present

## 2014-05-10 DIAGNOSIS — B351 Tinea unguium: Secondary | ICD-10-CM | POA: Diagnosis not present

## 2014-05-10 DIAGNOSIS — M79609 Pain in unspecified limb: Secondary | ICD-10-CM | POA: Diagnosis not present

## 2014-05-10 DIAGNOSIS — E119 Type 2 diabetes mellitus without complications: Secondary | ICD-10-CM | POA: Diagnosis not present

## 2014-06-11 DIAGNOSIS — R609 Edema, unspecified: Secondary | ICD-10-CM | POA: Diagnosis not present

## 2014-07-09 DIAGNOSIS — E119 Type 2 diabetes mellitus without complications: Secondary | ICD-10-CM | POA: Diagnosis not present

## 2014-07-09 DIAGNOSIS — E782 Mixed hyperlipidemia: Secondary | ICD-10-CM | POA: Diagnosis not present

## 2014-07-16 DIAGNOSIS — Z23 Encounter for immunization: Secondary | ICD-10-CM | POA: Diagnosis not present

## 2014-07-16 DIAGNOSIS — Z6831 Body mass index (BMI) 31.0-31.9, adult: Secondary | ICD-10-CM | POA: Diagnosis not present

## 2014-07-16 DIAGNOSIS — Z1231 Encounter for screening mammogram for malignant neoplasm of breast: Secondary | ICD-10-CM | POA: Diagnosis not present

## 2014-07-16 DIAGNOSIS — E782 Mixed hyperlipidemia: Secondary | ICD-10-CM | POA: Diagnosis not present

## 2014-08-11 ENCOUNTER — Inpatient Hospital Stay (HOSPITAL_COMMUNITY)
Admission: EM | Admit: 2014-08-11 | Discharge: 2014-08-13 | DRG: 189 | Disposition: A | Discharge status: Home / Self Care | Payer: Medicare Other | Attending: Internal Medicine | Admitting: Internal Medicine

## 2014-08-11 ENCOUNTER — Encounter (HOSPITAL_COMMUNITY): Payer: Self-pay | Admitting: Emergency Medicine

## 2014-08-11 ENCOUNTER — Emergency Department (HOSPITAL_COMMUNITY): Payer: Medicare Other

## 2014-08-11 DIAGNOSIS — J984 Other disorders of lung: Secondary | ICD-10-CM | POA: Diagnosis not present

## 2014-08-11 DIAGNOSIS — J9610 Chronic respiratory failure, unspecified whether with hypoxia or hypercapnia: Secondary | ICD-10-CM

## 2014-08-11 DIAGNOSIS — M810 Age-related osteoporosis without current pathological fracture: Secondary | ICD-10-CM | POA: Diagnosis present

## 2014-08-11 DIAGNOSIS — Z9981 Dependence on supplemental oxygen: Secondary | ICD-10-CM | POA: Diagnosis not present

## 2014-08-11 DIAGNOSIS — Z90710 Acquired absence of both cervix and uterus: Secondary | ICD-10-CM

## 2014-08-11 DIAGNOSIS — R404 Transient alteration of awareness: Secondary | ICD-10-CM | POA: Diagnosis not present

## 2014-08-11 DIAGNOSIS — E785 Hyperlipidemia, unspecified: Secondary | ICD-10-CM | POA: Diagnosis present

## 2014-08-11 DIAGNOSIS — M109 Gout, unspecified: Secondary | ICD-10-CM | POA: Diagnosis present

## 2014-08-11 DIAGNOSIS — R0602 Shortness of breath: Secondary | ICD-10-CM | POA: Diagnosis not present

## 2014-08-11 DIAGNOSIS — G9340 Encephalopathy, unspecified: Secondary | ICD-10-CM | POA: Diagnosis not present

## 2014-08-11 DIAGNOSIS — J441 Chronic obstructive pulmonary disease with (acute) exacerbation: Secondary | ICD-10-CM | POA: Diagnosis present

## 2014-08-11 DIAGNOSIS — R0689 Other abnormalities of breathing: Secondary | ICD-10-CM

## 2014-08-11 DIAGNOSIS — G9349 Other encephalopathy: Secondary | ICD-10-CM | POA: Diagnosis present

## 2014-08-11 DIAGNOSIS — E119 Type 2 diabetes mellitus without complications: Secondary | ICD-10-CM | POA: Diagnosis not present

## 2014-08-11 DIAGNOSIS — Z87891 Personal history of nicotine dependence: Secondary | ICD-10-CM | POA: Diagnosis not present

## 2014-08-11 DIAGNOSIS — J449 Chronic obstructive pulmonary disease, unspecified: Secondary | ICD-10-CM | POA: Diagnosis not present

## 2014-08-11 DIAGNOSIS — Z8541 Personal history of malignant neoplasm of cervix uteri: Secondary | ICD-10-CM

## 2014-08-11 DIAGNOSIS — J9622 Acute and chronic respiratory failure with hypercapnia: Principal | ICD-10-CM | POA: Diagnosis present

## 2014-08-11 DIAGNOSIS — I10 Essential (primary) hypertension: Secondary | ICD-10-CM | POA: Diagnosis present

## 2014-08-11 DIAGNOSIS — Z66 Do not resuscitate: Secondary | ICD-10-CM | POA: Diagnosis present

## 2014-08-11 HISTORY — DX: Gout, unspecified: M10.9

## 2014-08-11 HISTORY — DX: Calculus of gallbladder without cholecystitis without obstruction: K80.20

## 2014-08-11 LAB — BLOOD GAS, ARTERIAL
Acid-Base Excess: 7.1 mmol/L — ABNORMAL HIGH (ref 0.0–2.0)
Bicarbonate: 33.8 mEq/L — ABNORMAL HIGH (ref 20.0–24.0)
Drawn by: 331471
O2 Content: 4 L/min
O2 SAT: 96.5 %
PATIENT TEMPERATURE: 98.6
TCO2: 30.7 mmol/L (ref 0–100)
pCO2 arterial: 60.9 mmHg (ref 35.0–45.0)
pH, Arterial: 7.364 (ref 7.350–7.450)
pO2, Arterial: 82.5 mmHg (ref 80.0–100.0)

## 2014-08-11 LAB — CBC WITH DIFFERENTIAL/PLATELET
Basophils Absolute: 0 10*3/uL (ref 0.0–0.1)
Basophils Relative: 0 % (ref 0–1)
Eosinophils Absolute: 0.3 10*3/uL (ref 0.0–0.7)
Eosinophils Relative: 3 % (ref 0–5)
HCT: 40 % (ref 36.0–46.0)
HEMOGLOBIN: 12.5 g/dL (ref 12.0–15.0)
LYMPHS ABS: 0.9 10*3/uL (ref 0.7–4.0)
LYMPHS PCT: 9 % — AB (ref 12–46)
MCH: 26.9 pg (ref 26.0–34.0)
MCHC: 31.3 g/dL (ref 30.0–36.0)
MCV: 86 fL (ref 78.0–100.0)
MONOS PCT: 6 % (ref 3–12)
Monocytes Absolute: 0.6 10*3/uL (ref 0.1–1.0)
NEUTROS ABS: 8 10*3/uL — AB (ref 1.7–7.7)
NEUTROS PCT: 82 % — AB (ref 43–77)
Platelets: 147 10*3/uL — ABNORMAL LOW (ref 150–400)
RBC: 4.65 MIL/uL (ref 3.87–5.11)
RDW: 14.2 % (ref 11.5–15.5)
WBC: 9.8 10*3/uL (ref 4.0–10.5)

## 2014-08-11 LAB — URINALYSIS, ROUTINE W REFLEX MICROSCOPIC
Glucose, UA: NEGATIVE mg/dL
Hgb urine dipstick: NEGATIVE
KETONES UR: NEGATIVE mg/dL
Leukocytes, UA: NEGATIVE
NITRITE: NEGATIVE
PH: 5 (ref 5.0–8.0)
Protein, ur: 30 mg/dL — AB
Specific Gravity, Urine: 1.02 (ref 1.005–1.030)
Urobilinogen, UA: 1 mg/dL (ref 0.0–1.0)

## 2014-08-11 LAB — COMPREHENSIVE METABOLIC PANEL
ALBUMIN: 3.4 g/dL — AB (ref 3.5–5.2)
ALT: 10 U/L (ref 0–35)
ANION GAP: 12 (ref 5–15)
AST: 12 U/L (ref 0–37)
Alkaline Phosphatase: 74 U/L (ref 39–117)
BUN: 25 mg/dL — AB (ref 6–23)
CO2: 33 mEq/L — ABNORMAL HIGH (ref 19–32)
CREATININE: 0.91 mg/dL (ref 0.50–1.10)
Calcium: 9.3 mg/dL (ref 8.4–10.5)
Chloride: 92 mEq/L — ABNORMAL LOW (ref 96–112)
GFR calc Af Amer: 66 mL/min — ABNORMAL LOW (ref >90)
GFR calc non Af Amer: 57 mL/min — ABNORMAL LOW (ref >90)
Glucose, Bld: 170 mg/dL — ABNORMAL HIGH (ref 70–99)
POTASSIUM: 4.4 meq/L (ref 3.7–5.3)
Sodium: 137 mEq/L (ref 137–147)
TOTAL PROTEIN: 7.5 g/dL (ref 6.0–8.3)
Total Bilirubin: 0.7 mg/dL (ref 0.3–1.2)

## 2014-08-11 LAB — URINE MICROSCOPIC-ADD ON

## 2014-08-11 LAB — I-STAT TROPONIN, ED: Troponin i, poc: 0 ng/mL (ref 0.00–0.08)

## 2014-08-11 LAB — HEMOGLOBIN A1C
Hgb A1c MFr Bld: 7.3 % — ABNORMAL HIGH (ref <5.7)
Mean Plasma Glucose: 163 mg/dL — ABNORMAL HIGH (ref <117)

## 2014-08-11 LAB — PROTIME-INR
INR: 1.16 (ref 0.00–1.49)
PROTHROMBIN TIME: 15 s (ref 11.6–15.2)

## 2014-08-11 LAB — LIPASE, BLOOD: LIPASE: 20 U/L (ref 11–59)

## 2014-08-11 LAB — APTT: APTT: 38 s — AB (ref 24–37)

## 2014-08-11 LAB — GLUCOSE, CAPILLARY: Glucose-Capillary: 314 mg/dL — ABNORMAL HIGH (ref 70–99)

## 2014-08-11 LAB — MAGNESIUM: Magnesium: 1.8 mg/dL (ref 1.5–2.5)

## 2014-08-11 LAB — PRO B NATRIURETIC PEPTIDE: PRO B NATRI PEPTIDE: 106.5 pg/mL (ref 0–450)

## 2014-08-11 MED ORDER — IPRATROPIUM-ALBUTEROL 0.5-2.5 (3) MG/3ML IN SOLN
3.0000 mL | RESPIRATORY_TRACT | Status: DC
  Administered 2014-08-11 (×2): 3 mL via RESPIRATORY_TRACT
  Filled 2014-08-11 (×2): qty 3

## 2014-08-11 MED ORDER — IPRATROPIUM-ALBUTEROL 0.5-2.5 (3) MG/3ML IN SOLN
3.0000 mL | Freq: Two times a day (BID) | RESPIRATORY_TRACT | Status: DC
  Administered 2014-08-12 – 2014-08-13 (×3): 3 mL via RESPIRATORY_TRACT
  Filled 2014-08-11 (×3): qty 3

## 2014-08-11 MED ORDER — OXYCODONE HCL 5 MG/5ML PO SOLN: 1.2500 mg | ORAL | Status: DC | PRN

## 2014-08-11 MED ORDER — SODIUM CHLORIDE 0.9 % IJ SOLN: 3.0000 mL | INTRAMUSCULAR | Status: DC | PRN

## 2014-08-11 MED ORDER — DOCUSATE SODIUM 100 MG PO CAPS
100.0000 mg | ORAL_CAPSULE | Freq: Two times a day (BID) | ORAL | Status: DC
Start: 2014-08-11 — End: 2014-08-13
  Administered 2014-08-11 – 2014-08-13 (×4): 100 mg via ORAL
  Filled 2014-08-11 (×5): qty 1

## 2014-08-11 MED ORDER — ENOXAPARIN SODIUM 40 MG/0.4ML ~~LOC~~ SOLN
40.0000 mg | SUBCUTANEOUS | Status: DC
  Administered 2014-08-11 – 2014-08-12 (×2): 40 mg via SUBCUTANEOUS
  Filled 2014-08-11 (×3): qty 0.4

## 2014-08-11 MED ORDER — OMEGA-3-ACID ETHYL ESTERS 1 G PO CAPS
1.0000 g | ORAL_CAPSULE | Freq: Every day | ORAL | Status: DC
  Administered 2014-08-11 – 2014-08-13 (×3): 1 g via ORAL
  Filled 2014-08-11 (×3): qty 1

## 2014-08-11 MED ORDER — GUAIFENESIN ER 600 MG PO TB12
1200.0000 mg | ORAL_TABLET | Freq: Two times a day (BID) | ORAL | Status: DC
  Administered 2014-08-11 – 2014-08-13 (×4): 1200 mg via ORAL
  Filled 2014-08-11 (×5): qty 2

## 2014-08-11 MED ORDER — SORBITOL 70 % SOLN
30.0000 mL | Freq: Every day | Status: DC | PRN
  Filled 2014-08-11: qty 30

## 2014-08-11 MED ORDER — AMLODIPINE BESYLATE 5 MG PO TABS
5.0000 mg | ORAL_TABLET | Freq: Every day | ORAL | Status: DC
  Administered 2014-08-12 – 2014-08-13 (×2): 5 mg via ORAL
  Filled 2014-08-11 (×2): qty 1

## 2014-08-11 MED ORDER — SODIUM CHLORIDE 0.9 % IJ SOLN: 3.0000 mL | Freq: Two times a day (BID) | INTRAMUSCULAR | Status: DC

## 2014-08-11 MED ORDER — ONDANSETRON HCL 4 MG PO TABS: 4.0000 mg | ORAL_TABLET | Freq: Four times a day (QID) | ORAL | Status: DC | PRN

## 2014-08-11 MED ORDER — GUAIFENESIN-DM 100-10 MG/5ML PO SYRP: 5.0000 mL | ORAL_SOLUTION | ORAL | Status: DC | PRN

## 2014-08-11 MED ORDER — SODIUM CHLORIDE 0.9 % IV SOLN: 250.0000 mL | INTRAVENOUS | Status: DC | PRN

## 2014-08-11 MED ORDER — IPRATROPIUM BROMIDE 0.02 % IN SOLN: 0.5000 mg | RESPIRATORY_TRACT | Status: DC | PRN

## 2014-08-11 MED ORDER — ONDANSETRON HCL 4 MG/2ML IJ SOLN: 4.0000 mg | Freq: Four times a day (QID) | INTRAMUSCULAR | Status: DC | PRN

## 2014-08-11 MED ORDER — IPRATROPIUM-ALBUTEROL 0.5-2.5 (3) MG/3ML IN SOLN: 3.0000 mL | RESPIRATORY_TRACT | Status: DC | PRN

## 2014-08-11 MED ORDER — PANTOPRAZOLE SODIUM 40 MG PO TBEC
40.0000 mg | DELAYED_RELEASE_TABLET | Freq: Every day | ORAL | Status: DC
  Administered 2014-08-11 – 2014-08-13 (×3): 40 mg via ORAL
  Filled 2014-08-11 (×4): qty 1

## 2014-08-11 MED ORDER — SODIUM CHLORIDE 0.9 % IJ SOLN
3.0000 mL | Freq: Two times a day (BID) | INTRAMUSCULAR | Status: DC
  Administered 2014-08-13: 3 mL via INTRAVENOUS

## 2014-08-11 MED ORDER — FLEET ENEMA 7-19 GM/118ML RE ENEM: 1.0000 | ENEMA | Freq: Once | RECTAL | Status: AC | PRN

## 2014-08-11 MED ORDER — OXYCODONE-ACETAMINOPHEN 5-325 MG PO TABS: 0.2500 | ORAL_TABLET | ORAL | Status: DC | PRN

## 2014-08-11 MED ORDER — METHYLPREDNISOLONE SODIUM SUCC 125 MG IJ SOLR
125.0000 mg | Freq: Once | INTRAMUSCULAR | Status: AC
  Administered 2014-08-11: 125 mg via INTRAVENOUS
  Filled 2014-08-11: qty 2

## 2014-08-11 MED ORDER — LEVOFLOXACIN IN D5W 500 MG/100ML IV SOLN
500.0000 mg | INTRAVENOUS | Status: DC
  Administered 2014-08-11 – 2014-08-12 (×2): 500 mg via INTRAVENOUS
  Filled 2014-08-11 (×3): qty 100

## 2014-08-11 MED ORDER — ALBUTEROL SULFATE (2.5 MG/3ML) 0.083% IN NEBU: 2.5000 mg | INHALATION_SOLUTION | RESPIRATORY_TRACT | Status: DC | PRN

## 2014-08-11 MED ORDER — METHYLPREDNISOLONE SODIUM SUCC 125 MG IJ SOLR
80.0000 mg | Freq: Three times a day (TID) | INTRAMUSCULAR | Status: DC
  Administered 2014-08-11 – 2014-08-13 (×5): 80 mg via INTRAVENOUS
  Filled 2014-08-11 (×8): qty 1.28

## 2014-08-11 MED ORDER — OMEGA-3 FATTY ACIDS 1000 MG PO CAPS: 2.0000 g | ORAL_CAPSULE | Freq: Two times a day (BID) | ORAL | Status: DC

## 2014-08-11 MED ORDER — ACETAMINOPHEN 650 MG RE SUPP: 650.0000 mg | Freq: Four times a day (QID) | RECTAL | Status: DC | PRN

## 2014-08-11 MED ORDER — ASPIRIN EC 81 MG PO TBEC
81.0000 mg | DELAYED_RELEASE_TABLET | Freq: Every day | ORAL | Status: DC
Start: 2014-08-11 — End: 2014-08-13
  Administered 2014-08-11 – 2014-08-13 (×3): 81 mg via ORAL
  Filled 2014-08-11 (×3): qty 1

## 2014-08-11 MED ORDER — INSULIN ASPART 100 UNIT/ML ~~LOC~~ SOLN
0.0000 [IU] | Freq: Three times a day (TID) | SUBCUTANEOUS | Status: DC
  Administered 2014-08-12: 7 [IU] via SUBCUTANEOUS
  Administered 2014-08-12: 15 [IU] via SUBCUTANEOUS
  Administered 2014-08-12: 7 [IU] via SUBCUTANEOUS
  Administered 2014-08-13 (×2): 11 [IU] via SUBCUTANEOUS

## 2014-08-11 MED ORDER — ACETAMINOPHEN 325 MG PO TABS: 650.0000 mg | ORAL_TABLET | Freq: Four times a day (QID) | ORAL | Status: DC | PRN

## 2014-08-11 MED ORDER — ROSUVASTATIN CALCIUM 20 MG PO TABS
20.0000 mg | ORAL_TABLET | Freq: Every day | ORAL | Status: DC
  Administered 2014-08-11 – 2014-08-12 (×2): 20 mg via ORAL
  Filled 2014-08-11 (×3): qty 1

## 2014-08-11 MED ORDER — POLYETHYLENE GLYCOL 3350 17 G PO PACK
17.0000 g | PACK | Freq: Every day | ORAL | Status: DC | PRN
Start: 2014-08-11 — End: 2014-08-13
  Filled 2014-08-11: qty 1

## 2014-08-11 NOTE — ED Provider Notes (Signed)
CSN: [id number]     Arrival date & time 08/11/14  1236 History   First MD Initiated Contact with Patient 08/11/14 1245     Chief Complaint  Patient presents with  . Shortness of Breath  . Weakness     (Consider location/radiation/quality/duration/timing/severity/associated sxs/prior Treatment) HPI The patient has had 3 days of increasing generalized weakness and shortness breath. She does have a history of COPD. The daughter notes also some slight degree of confusion or "just not being herself". The patient reports that she has had a cough and she has been unable to bring up any significant amount of sputum. She reports that she did try to take some guaifenesin to loosen up the mucus. She has not had a documented fever. She denies chest pain. There is no lower extremity swelling or calf pain. She has experienced similar symptoms in the past with pneumonia and COPD. She does use a Combivent inhaler in the mornings. The patient is on 3-1/2 L home oxygen. She has a nebulizer machine but does not use it very frequently.  Past Medical History  Diagnosis Date  . COPD (chronic obstructive pulmonary disease)   . Osteoporosis   . Cancer     colon  . Cancer of cervix     cervix  . Gout   . Gallstones    Past Surgical History  Procedure Laterality Date  . Abdominal hysterectomy    . Orthopedic surgery    . Colon surgery    . Gallstone surgery      stones removed but still has gallbladder   Family History  Problem Relation Age of Onset  . Hypertension Mother   . Heart disease Mother   . Diabetes Sister   . Diabetes Sister   . Diabetes Sister   . Diabetes Sister    History  Substance Use Topics  . Smoking status: Former Smoker    Types: Cigarettes    Quit date: 08/11/1994  . Smokeless tobacco: Not on file  . Alcohol Use: No   OB History   Grav Para Term Preterm Abortions TAB SAB Ect Mult Living                 Review of Systems  10 Systems reviewed and are negative for  acute change except as noted in the HPI.   Allergies  Review of patient's allergies indicates no known allergies.  Home Medications   Prior to Admission medications   Medication Sig Start Date End Date Taking? Authorizing Provider  albuterol (PROVENTIL HFA;VENTOLIN HFA) 108 (90 BASE) MCG/ACT inhaler Inhale 1 puff into the lungs every 6 (six) hours as needed for wheezing or shortness of breath.   Yes Historical Provider, MD  albuterol (PROVENTIL) (2.5 MG/3ML) 0.083% nebulizer solution Take 2.5 mg by nebulization every 6 (six) hours as needed for wheezing or shortness of breath.   Yes Historical Provider, MD  amLODipine (NORVASC) 5 MG tablet Take 5 mg by mouth daily.   Yes Historical Provider, MD  aspirin EC 81 MG tablet Take 81 mg by mouth daily.   Yes Historical Provider, MD  Cholecalciferol (VITAMIN D) 2000 UNITS CAPS Take 2,000 Units by mouth daily.   Yes Historical Provider, MD  fish oil-omega-3 fatty acids 1000 MG capsule Take 2 g by mouth 2 (two) times daily.   Yes Historical Provider, MD  Influenza Vac Split Quad (FLUZONE) 0.25 ML injection Inject 0.25 mLs into the muscle once.   Yes Historical Provider, MD  Ipratropium-Albuterol (COMBIVENT RESPIMAT)  20-100 MCG/ACT AERS respimat Inhale 1 puff into the lungs 4 (four) times daily as needed for wheezing.   Yes Historical Provider, MD  oxyCODONE-acetaminophen (PERCOCET/ROXICET) 5-325 MG per tablet Take 0.25 tablets by mouth every 4 (four) hours as needed for severe pain.   Yes Historical Provider, MD  rosuvastatin (CRESTOR) 20 MG tablet Take 20 mg by mouth at bedtime.   Yes Historical Provider, MD   BP 126/58  Pulse 80  Temp(Src) 98.1 F (36.7 C) (Oral)  Resp 20  SpO2 91% Physical Exam  Constitutional: She is oriented to person, place, and time. She appears well-developed and well-nourished.  HENT:  Head: Normocephalic and atraumatic.  Eyes: EOM are normal. Pupils are equal, round, and reactive to light.  Neck: Neck supple.   Cardiovascular: Normal rate, regular rhythm, normal heart sounds and intact distal pulses.   Pulmonary/Chest: Effort normal.  Patient has soft and distant breath sounds. Occasional fine expiratory wheeze. No acute respiratory distress.  Abdominal: Soft. Bowel sounds are normal. She exhibits no distension. There is no tenderness.  Musculoskeletal: Normal range of motion. She exhibits no edema.  Neurological: She is alert and oriented to person, place, and time. She has normal strength. Coordination normal. GCS eye subscore is 4. GCS verbal subscore is 5. GCS motor subscore is 6.  Skin: Skin is warm, dry and intact.  Psychiatric: She has a normal mood and affect.     ED Course  Procedures (including critical care time) Labs Review Labs Reviewed  COMPREHENSIVE METABOLIC PANEL - Abnormal; Notable for the following:    Chloride 92 (*)    CO2 33 (*)    Glucose, Bld 170 (*)    BUN 25 (*)    Albumin 3.4 (*)    GFR calc non Af Amer 57 (*)    GFR calc Af Amer 66 (*)    All other components within normal limits  CBC WITH DIFFERENTIAL - Abnormal; Notable for the following:    Platelets 147 (*)    Neutrophils Relative % 82 (*)    Neutro Abs 8.0 (*)    Lymphocytes Relative 9 (*)    All other components within normal limits  APTT - Abnormal; Notable for the following:    aPTT 38 (*)    All other components within normal limits  URINALYSIS, ROUTINE W REFLEX MICROSCOPIC - Abnormal; Notable for the following:    APPearance CLOUDY (*)    Bilirubin Urine SMALL (*)    Protein, ur 30 (*)    All other components within normal limits  BLOOD GAS, ARTERIAL - Abnormal; Notable for the following:    pCO2 arterial 60.9 (*)    Bicarbonate 33.8 (*)    Acid-Base Excess 7.1 (*)    All other components within normal limits  URINE MICROSCOPIC-ADD ON - Abnormal; Notable for the following:    Squamous Epithelial / LPF FEW (*)    All other components within normal limits  LIPASE, BLOOD  PRO B NATRIURETIC  PEPTIDE  PROTIME-INR  I-STAT TROPOININ, ED    Imaging Review Dg Chest 2 View  08/11/2014   CLINICAL DATA:  Several day history of shortness of breath ; history of similar episodes in the past as well as history of multiple episodes of pneumonia ; history of emphysema.  EXAM: CHEST  2 VIEW  COMPARISON:  Chest x-ray of August 20, 2012  FINDINGS: The lungs are hyperinflated. There is no focal infiltrate. There is stable density overlying the right lateral costophrenic gutter. The  cardiac silhouette is mildly enlarged. The pulmonary vascularity is not engorged. The interstitial markings are mildly prominent though stable. The mediastinum is normal in width. There is no pleural effusion or pneumothorax. The bony thorax is unremarkable.  IMPRESSION: COPD. There is no evidence of pneumonia nor CHF nor other acute cardiopulmonary abnormality.   Electronically Signed   By: David  Martinique   On: 08/11/2014 14:44     EKG Interpretation None      MDM   Final diagnoses:  COPD exacerbation  Hypercapnia  Transient alteration of awareness   Based on the patient's history of increased sleeping and difficulty to arouse with waxing and waning disorientation, I believe this is most consistent with COPD exacerbation and hypercapnia. The patient's mental status is clear at this point time. She will be admitted for further treatment for COPD exacerbation. Currently by x-ray there is no evidence of acute pneumonia.    Charlesetta Shanks, MD 08/11/14 [phone]

## 2014-08-11 NOTE — ED Notes (Signed)
Pt c/o increased SOB and weakness x 3 days.  Denies pain.  Hx of COPD.  Pt's daughter reports "this is how she looked the last time she had PNA."

## 2014-08-11 NOTE — Progress Notes (Signed)
  CARE MANAGEMENT ED NOTE 08/11/2014  Patient:  NOVEMBER, SYPHER   Account Number:  [id number]  Date Initiated:  08/11/2014  Documentation initiated by:  Livia Snellen  Subjective/Objective Assessment:   Patient presents to Ed with shortness of breath and weakness for three days     Subjective/Objective Assessment Detail:   Patient with pmhx of COPD, colon and cervical cancer, gout and gallstones     Action/Plan:   Action/Plan Detail:   Anticipated DC Date:       Status Recommendation to Physician:   Result of Recommendation:    Other ED [place]  Other  PCP issues    Choice offered to / List presented to:            Status of service:  Completed, signed off  ED Comments:   ED Comments Detail:  EDCM spoke to patient and family member at bedside. Patient confirms her pcp is Dr. Jeri Modena of [facility].  Patient from [place].  Patient noted to be wearing oxygen in the ED.  Patient wears oxygen at home at 3.5 liters.  Per chart review, oxygen was being supplied by [employer] in the past. System updated.  No further EDCM needs at this time.

## 2014-08-11 NOTE — H&P (Signed)
[facility] History and Physical  Emily Owen WNU:[id number] DOB: [birth year] DOA: 08/11/2014  Referring physician: Dr Johnney Killian PCP: Dr Alric Quan, [place], [place]   Chief Complaint:   HPI: Emily Owen is a 80 y.o. female  With past medical history of chronic respiratory failure secondary to COPD on chronic home 020 3-1/2 L nasal cannula, gout, diet controlled diabetes mellitus who presents to the ED with a three-day history of some chest tightness, some shortness of breath, chills, productive cough of grayish sputum. Patient denies any substernal chest pain. No fever. No wheezing. No nausea, no vomiting, no diarrhea, no constipation, no dysuria. Patient also complains of generalized weakness. Patient's family had stated that patient had become increasingly lethargic and very sleepy. Patient was seen in the emergency room, blood gas obtained at a pH of 7.36 with a PCO2 of 60.9 PO2 of 82.5 bicarbonate of 34. Comprehensive metabolic profile obtained at a chloride of 92 bicarbonate of 33 BUN of 25 glucose of 170 albumin of 3.4 otherwise was within normal limits. Pro BNP was 106.5. Point-of-care troponin was negative. EKG showed a normal sinus rhythm. CBC was unremarkable. INR 1.16. Urinalysis was nitrite negative leukocytes negative. Chest x-ray was consistent with COPD but negative for pneumonia or CHF. Patient was given some duo nebs, IV steroids. We will called to admit the patient for further evaluation and management.   Review of Systems: As per history of present illness otherwise negative. Constitutional:  No weight loss, night sweats, Fevers, fatigue.  HEENT:  No headaches, Difficulty swallowing,Tooth/dental problems,Sore throat,  No sneezing, itching, ear ache, nasal congestion, post nasal drip,  Cardio-vascular:  No chest pain, Orthopnea, PND, swelling in lower extremities, anasarca, dizziness, palpitations  GI:  No heartburn, indigestion, abdominal pain,  nausea, vomiting, diarrhea, change in bowel habits, loss of appetite  Resp:  No shortness of breath with exertion or at rest. No excess mucus, no productive cough, No non-productive cough, No coughing up of blood.No change in color of mucus.No wheezing.No chest wall deformity  Skin:  no rash or lesions.  GU:  no dysuria, change in color of urine, no urgency or frequency. No flank pain.  Musculoskeletal:  No joint pain or swelling. No decreased range of motion. No back pain.  Psych:  No change in mood or affect. No depression or anxiety. No memory loss.   Past Medical History  Diagnosis Date  . COPD (chronic obstructive pulmonary disease)   . Osteoporosis   . Cancer     colon  . Cancer of cervix     cervix  . Gout   . Gallstones    Past Surgical History  Procedure Laterality Date  . Abdominal hysterectomy    . Orthopedic surgery    . Colon surgery    . Gallstone surgery      stones removed but still has gallbladder   Social History:  reports that she quit smoking about 20 years ago. Her smoking use included Cigarettes. She smoked 0.00 packs per day. She does not have any smokeless tobacco history on file. She reports that she does not drink alcohol or use illicit drugs.  No Known Allergies  Family History  Problem Relation Age of Onset  . Hypertension Mother   . Heart disease Mother   . Diabetes Sister   . Diabetes Sister   . Diabetes Sister   . Diabetes Sister      Prior to Admission medications   Medication Sig Start Date End Date Taking? Authorizing Provider  albuterol (PROVENTIL HFA;VENTOLIN HFA) 108 (90 BASE) MCG/ACT inhaler Inhale 1 puff into the lungs every 6 (six) hours as needed for wheezing or shortness of breath.   Yes Historical Provider, MD  albuterol (PROVENTIL) (2.5 MG/3ML) 0.083% nebulizer solution Take 2.5 mg by nebulization every 6 (six) hours as needed for wheezing or shortness of breath.   Yes Historical Provider, MD  amLODipine (NORVASC) 5 MG  tablet Take 5 mg by mouth daily.   Yes Historical Provider, MD  aspirin EC 81 MG tablet Take 81 mg by mouth daily.   Yes Historical Provider, MD  Cholecalciferol (VITAMIN D) 2000 UNITS CAPS Take 2,000 Units by mouth daily.   Yes Historical Provider, MD  fish oil-omega-3 fatty acids 1000 MG capsule Take 2 g by mouth 2 (two) times daily.   Yes Historical Provider, MD  Influenza Vac Split Quad (FLUZONE) 0.25 ML injection Inject 0.25 mLs into the muscle once.   Yes Historical Provider, MD  Ipratropium-Albuterol (COMBIVENT RESPIMAT) 20-100 MCG/ACT AERS respimat Inhale 1 puff into the lungs 4 (four) times daily as needed for wheezing.   Yes Historical Provider, MD  oxyCODONE-acetaminophen (PERCOCET/ROXICET) 5-325 MG per tablet Take 0.25 tablets by mouth every 4 (four) hours as needed for severe pain.   Yes Historical Provider, MD  rosuvastatin (CRESTOR) 20 MG tablet Take 20 mg by mouth at bedtime.   Yes Historical Provider, MD   Physical Exam: Filed Vitals:   08/11/14 1242 08/11/14 1300 08/11/14 1303  BP: 126/58    Pulse: 80    Temp: 98.1 F (36.7 C)    TempSrc: Oral    Resp: 20    SpO2: 78% 75% 91%    Wt Readings from Last 3 Encounters:  08/22/12 82.3 kg (181 lb 7 oz)    General:  Elderly female well-developed well-nourished speaking in full sentences in no acute respiratory distress.  Eyes: PERRLA, EOMI, normal lids, irises & conjunctiva ENT: grossly normal hearing, lips & tongue. Dry mucous membranes. Neck: no LAD, masses or thyromegaly Cardiovascular: RRR, no m/r/g. No LE edema. Telemetry: SR, no arrhythmias  Respiratory: Patient with minimal to mild her story and expiratory wheezing. Poor to fair air movement. No crackles. No coarse breath sounds. Abdomen: soft, ntnd, positive bowel sounds, no rebound, no guarding Skin: no rash or induration seen on limited exam Musculoskeletal: grossly normal tone BUE/BLE Psychiatric: grossly normal mood and affect, speech fluent and  appropriate Neurologic: Alert and oriented x3. Greater nerves II through XII are grossly intact. No focal deficits.           Labs on Admission:  Basic Metabolic Panel:  Recent Labs Lab 08/11/14 1307  NA 137  K 4.4  CL 92*  CO2 33*  GLUCOSE 170*  BUN 25*  CREATININE 0.91  CALCIUM 9.3   Liver Function Tests:  Recent Labs Lab 08/11/14 1307  AST 12  ALT 10  ALKPHOS 74  BILITOT 0.7  PROT 7.5  ALBUMIN 3.4*    Recent Labs Lab 08/11/14 1307  LIPASE 20   No results found for this basename: AMMONIA,  in the last 168 hours CBC:  Recent Labs Lab 08/11/14 1307  WBC 9.8  NEUTROABS 8.0*  HGB 12.5  HCT 40.0  MCV 86.0  PLT 147*   Cardiac Enzymes: No results found for this basename: CKTOTAL, CKMB, CKMBINDEX, TROPONINI,  in the last 168 hours  BNP (last 3 results)  Recent Labs  08/11/14 1307  PROBNP 106.5   CBG: No results found for this  basename: GLUCAP,  in the last 168 hours  Radiological Exams on Admission: Dg Chest 2 View  08/11/2014   CLINICAL DATA:  Several day history of shortness of breath ; history of similar episodes in the past as well as history of multiple episodes of pneumonia ; history of emphysema.  EXAM: CHEST  2 VIEW  COMPARISON:  Chest x-ray of August 20, 2012  FINDINGS: The lungs are hyperinflated. There is no focal infiltrate. There is stable density overlying the right lateral costophrenic gutter. The cardiac silhouette is mildly enlarged. The pulmonary vascularity is not engorged. The interstitial markings are mildly prominent though stable. The mediastinum is normal in width. There is no pleural effusion or pneumothorax. The bony thorax is unremarkable.  IMPRESSION: COPD. There is no evidence of pneumonia nor CHF nor other acute cardiopulmonary abnormality.   Electronically Signed   By: David  Martinique   On: 08/11/2014 14:44    EKG: Independently reviewed. Normal sinus rhythm  Assessment/Plan Principal Problem:   COPD with acute  exacerbation Active Problems:   Diabetes type 2, controlled on diet   Hyperlipidemia   Chronic respiratory failure   Encephalopathy acute   Gout   HTN (hypertension)  #1 acute COPD exacerbation Patient presented with generalized weakness, some shortness of breath on exertion with productive cough. ABG on admission not to be elevated PCO2 of 60.9 however pH is within normal limits. Patient with some clinical improvement after IV steroids and nebulizer treatments. Will admit the patient to telemetry. We'll place on IV Levaquin, IV Solu-Medrol 80 mg IV q. 8 hours, scheduled nebulizers, Mucinex, oxygen, supportive care. Follow.   #2 acute encephalopathy Likely secondary to problem #1 and hypercarbia. Patient alert and following commands oriented x4. Per family improvement in her mental status since being in the emergency room. We'll continue treatment as in problem #1. Follow.  #3 diet-controlled type 2 diabetes Check a hemoglobin A1c. Place on sliding scale insulin. Follow.  #4 hypertension Continue home dose Norvasc.  #5 gout Stable.  #6 chronic respiratory failure on home O2 Secondary to chronic COPD. Continue oxygen. See problem #1.  #7 prophylaxis Protonix for GI prophylaxis. Lovenox for DVT prophylaxis.  Code Status: DO NOT RESUSCITATE DVT Prophylaxis: Lovenox. Family Communication: Updated patient and daughter at bedside. Disposition Plan: Admit to telemetry.  Time spent: 65 minutes.  [facility] MD [facility] Pager [phone]

## 2014-08-12 DIAGNOSIS — J9622 Acute and chronic respiratory failure with hypercapnia: Principal | ICD-10-CM

## 2014-08-12 LAB — CBC
HCT: 37.8 % (ref 36.0–46.0)
Hemoglobin: 12 g/dL (ref 12.0–15.0)
MCH: 26.7 pg (ref 26.0–34.0)
MCHC: 31.7 g/dL (ref 30.0–36.0)
MCV: 84 fL (ref 78.0–100.0)
PLATELETS: 160 10*3/uL (ref 150–400)
RBC: 4.5 MIL/uL (ref 3.87–5.11)
RDW: 13.9 % (ref 11.5–15.5)
WBC: 6.4 10*3/uL (ref 4.0–10.5)

## 2014-08-12 LAB — GLUCOSE, CAPILLARY
GLUCOSE-CAPILLARY: 227 mg/dL — AB (ref 70–99)
Glucose-Capillary: 248 mg/dL — ABNORMAL HIGH (ref 70–99)
Glucose-Capillary: 326 mg/dL — ABNORMAL HIGH (ref 70–99)

## 2014-08-12 LAB — BASIC METABOLIC PANEL
Anion gap: 10 (ref 5–15)
BUN: 30 mg/dL — AB (ref 6–23)
CALCIUM: 9.8 mg/dL (ref 8.4–10.5)
CO2: 33 mEq/L — ABNORMAL HIGH (ref 19–32)
CREATININE: 0.91 mg/dL (ref 0.50–1.10)
Chloride: 93 mEq/L — ABNORMAL LOW (ref 96–112)
GFR calc Af Amer: 66 mL/min — ABNORMAL LOW (ref >90)
GFR, EST NON AFRICAN AMERICAN: 57 mL/min — AB (ref >90)
GLUCOSE: 238 mg/dL — AB (ref 70–99)
Potassium: 5.1 mEq/L (ref 3.7–5.3)
Sodium: 136 mEq/L — ABNORMAL LOW (ref 137–147)

## 2014-08-12 NOTE — Evaluation (Signed)
Physical Therapy Evaluation Patient Details Name: Emily Owen MRN: [id number] DOB: 06/13/1930 Today's Date: 08/12/2014   History of Present Illness  80 yo female admitted with COPD exac, weakness. Hx of COPD, osteoporosis, colon cancer, gout, DM  Clinical Impression  On eval, pt required Min guard assist for mobility-able to ambulate ~500 feet while holding onto IV pole. Unsteady at times but no overt LOB. O2 sats 85% on 3L O2 during ambulation. Recommended to pt that she use an assistive device for stability until she returns to baseline-pt agreeable to use cane. Recommend home safety evaluation if this can be set up.     Follow Up Recommendations Home health PT (home safety evaluation-if this can be arranged. Pt states she is headed back to [employer])    Equipment Recommendations  None recommended by PT (pt states she has access to walker, cane)    Recommendations for Other Services OT consult     Precautions / Restrictions Precautions Precautions: Fall Precaution Comments: O2 dependent Restrictions Weight Bearing Restrictions: No      Mobility  Bed Mobility Overal bed mobility: Modified Independent                Transfers Overall transfer level: Needs assistance   Transfers: Sit to/from Stand Sit to Stand: Min guard         General transfer comment: close guard for safety  Ambulation/Gait Ambulation/Gait assistance: Min guard Ambulation Distance (Feet): 500 Feet (IV pole)   Gait Pattern/deviations: Decreased stride length;Staggering left;Staggering right     General Gait Details: dyspnea 2/4. tolerated well. O2 sats 85% on 3L O2 during ambulation. Inttermittent unsteadiness noted.   Stairs            Wheelchair Mobility    Modified Rankin (Stroke Patients Only)       Balance Overall balance assessment: Needs assistance         Standing balance support: No upper extremity supported Standing balance-Leahy Scale: Fair Standing  balance comment: Had pt perform static standing while withstanding external perturbations, EO/EC, narrow BOS-pt had difficulty getting into narrow BOS stance without external support. Once in position, pt could maintain with Min guard assist. Pt was able to pick up object  and perform 360 degree turn(s) with Min guard assist as well.              High level balance activites: Turns;Direction changes;Backward walking High Level Balance Comments: Min guard assist needed.              Pertinent Vitals/Pain Pain Assessment: No/denies pain    Home Living Family/patient expects to be discharged to:: Private residence Living Arrangements: Alone   Type of Home: House Home Access: Level entry     Home Layout: One level Home Equipment: Environmental consultant - 2 wheels;Cane - quad (O2)      Prior Function Level of Independence: Independent               Hand Dominance        Extremity/Trunk Assessment   Upper Extremity Assessment: Overall WFL for tasks assessed           Lower Extremity Assessment: Generalized weakness      Cervical / Trunk Assessment: Normal  Communication   Communication: No difficulties  Cognition Arousal/Alertness: Awake/alert Behavior During Therapy: WFL for tasks assessed/performed Overall Cognitive Status: Within Functional Limits for tasks assessed  General Comments      Exercises        Assessment/Plan    PT Assessment Patient needs continued PT services  PT Diagnosis Difficulty walking;Generalized weakness   PT Problem List Decreased activity tolerance;Decreased balance;Decreased mobility  PT Treatment Interventions Gait training;DME instruction;Functional mobility training;Therapeutic activities;Therapeutic exercise;Patient/family education;Balance training   PT Goals (Current goals can be found in the Care Plan section) Acute Rehab PT Goals Patient Stated Goal: back home to [facility] PT Goal Formulation:  With patient Time For Goal Achievement: 08/26/14 Potential to Achieve Goals: Good    Frequency Min 3X/week   Barriers to discharge        Co-evaluation               End of Session Equipment Utilized During Treatment: Gait belt;Oxygen Activity Tolerance: Patient tolerated treatment well Patient left: in chair;with call bell/phone within reach           Time: 1007-1034 PT Time Calculation (min): 27 min   Charges:   PT Evaluation $Initial PT Evaluation Tier I: 1 Procedure PT Treatments $Gait Training: 23-37 mins   PT G Codes:          Weston Anna, MPT Pager: [phone]

## 2014-08-12 NOTE — Progress Notes (Signed)
[facility] PROGRESS NOTE  Emily Owen [id number]:[id number] DOB: 01-25-1930 DOA: 08/11/2014 PCP: PROVIDER NOT IN SYSTEM Brief narrative 80 year old female with history of COPD with chronic respiratory failure on home oxygen 2-3 a half liters via nasal cannula, gout, diet-controlled diabetes mellitus presented with two-day history of shortness of breath with productive sputum and some chest tightness. As per family patient also found to be increasingly lethargic and sleepy. Patient was found to be in hypercarbic respiratory failure with PCO2 of 60. Patient admitted for respiratory failure with COPD exacerbation.  Assessment/Plan: Acute hypercarbic respiratory failure with COPD exacerbation Continue IV Solu-Medrol, scheduled nebs, IV Levaquin, Mucinex, oxygen via nasal cannula. Patient's O2 sat drops to mid 80s on ambulation on 3 L. Monitor closely.  Acute encephalopathy Secondary to hypercarbic respiratory failure. Mental status back to normal.  Diet-controlled diabetic mellitus Continue sliding scale insulin  Hypertension Stable. Continue Norvasc  Gout Stable  DVT prophylaxis: Subcutaneous Lovenox  Diet: Diabetic    Code Status: DO NOT RESUSCITATE Family Communication: Called daughter and left a message  Disposition Plan: Home once improved , likely next 48 hours   Consultants:  None  Procedures:  None  Antibiotics:  Levaquin  HPI/Subjective: Reason seen and examined this morning. Reports of bleeding to be much better  Objective: Filed Vitals:   08/12/14 0351  BP: 136/57  Pulse: 75  Temp: 98.2 F (36.8 C)  Resp: 20   No intake or output data in the 24 hours ending 08/12/14 1513 Filed Weights   08/11/14 1712 08/12/14 0801  Weight: 82.3 kg (181 lb 7 oz) 83.3 kg (183 lb 10.3 oz)    Exam:   General:  Elderly female in no acute distress  HEENT: No pallor, moist oral mucosa  Chest: Few Scattered expiratory wheeze,   Cardiovascular: S1-S2, no  murmurs  Abdomen: Soft, nondistended, nontender, bowel sounds present  Extremities: Warm, no edema   Data Reviewed: Basic Metabolic Panel:  Recent Labs Lab 08/11/14 1307 08/11/14 1829 08/12/14 0345  NA 137  --  136*  K 4.4  --  5.1  CL 92*  --  93*  CO2 33*  --  33*  GLUCOSE 170*  --  238*  BUN 25*  --  30*  CREATININE 0.91  --  0.91  CALCIUM 9.3  --  9.8  MG  --  1.8  --    Liver Function Tests:  Recent Labs Lab 08/11/14 1307  AST 12  ALT 10  ALKPHOS 74  BILITOT 0.7  PROT 7.5  ALBUMIN 3.4*    Recent Labs Lab 08/11/14 1307  LIPASE 20   No results found for this basename: AMMONIA,  in the last 168 hours CBC:  Recent Labs Lab 08/11/14 1307 08/12/14 0345  WBC 9.8 6.4  NEUTROABS 8.0*  --   HGB 12.5 12.0  HCT 40.0 37.8  MCV 86.0 84.0  PLT 147* 160   Cardiac Enzymes: No results found for this basename: CKTOTAL, CKMB, CKMBINDEX, TROPONINI,  in the last 168 hours BNP (last 3 results)  Recent Labs  08/11/14 1307  PROBNP 106.5   CBG:  Recent Labs Lab 08/11/14 2108 08/12/14 0804 08/12/14 1221  GLUCAP 314* 248* 326*    No results found for this or any previous visit (from the past 240 hour(s)).   Studies: Dg Chest 2 View  08/11/2014   CLINICAL DATA:  Several day history of shortness of breath ; history of similar episodes in the past as well as history of multiple episodes  of pneumonia ; history of emphysema.  EXAM: CHEST  2 VIEW  COMPARISON:  Chest x-ray of August 20, 2012  FINDINGS: The lungs are hyperinflated. There is no focal infiltrate. There is stable density overlying the right lateral costophrenic gutter. The cardiac silhouette is mildly enlarged. The pulmonary vascularity is not engorged. The interstitial markings are mildly prominent though stable. The mediastinum is normal in width. There is no pleural effusion or pneumothorax. The bony thorax is unremarkable.  IMPRESSION: COPD. There is no evidence of pneumonia nor CHF nor other acute  cardiopulmonary abnormality.   Electronically Signed   By: David  Martinique   On: 08/11/2014 14:44    Scheduled Meds: . amLODipine  5 mg Oral Daily  . aspirin EC  81 mg Oral Daily  . docusate sodium  100 mg Oral BID  . enoxaparin (LOVENOX) injection  40 mg Subcutaneous Q24H  . guaiFENesin  1,200 mg Oral BID  . insulin aspart  0-20 Units Subcutaneous TID WC  . ipratropium-albuterol  3 mL Nebulization BID  . levofloxacin (LEVAQUIN) IV  500 mg Intravenous Q24H  . methylPREDNISolone (SOLU-MEDROL) injection  80 mg Intravenous 3 times per day  . omega-3 acid ethyl esters  1 g Oral Daily  . pantoprazole  40 mg Oral Q0600  . rosuvastatin  20 mg Oral QHS  . sodium chloride  3 mL Intravenous Q12H  . sodium chloride  3 mL Intravenous Q12H   Continuous Infusions:     Time spent: 25 minutes    Emily Owen, [place]  [facility] Pager [phone] If 7PM-7AM, please contact night-coverage at [email or web address], password [email or web address] 08/12/2014, 3:13 PM  LOS: 1 day

## 2014-08-12 NOTE — Evaluation (Signed)
Occupational Therapy Evaluation Patient Details Name: Emily Owen MRN: [id number] DOB: [birth year] Today's Date: 08/12/2014    History of Present Illness 80 yo female admitted with COPD exac, weakness. Hx of COPD, osteoporosis, colon cancer, gout, DM   Clinical Impression   Pt's O2 on 3L with activity between 90-93%. Reviewed/instructed on energy conservation techniques and practiced up in room to the bathroom for ADL. Will follow while on acute to reinforce safety and progress independence with ADL.     Follow Up Recommendations  No OT follow up;Supervision - Intermittent    Equipment Recommendations  None recommended by OT    Recommendations for Other Services       Precautions / Restrictions Precautions Precautions: Fall Precaution Comments: O2 dependent Restrictions Weight Bearing Restrictions: No      Mobility Bed Mobility Overal bed mobility: Modified Independent                Transfers Overall transfer level: Needs assistance   Transfers: Sit to/from Stand Sit to Stand: Min guard         General transfer comment: close guard for safety    Balance Overall balance assessment: Needs assistance         Standing balance support: No upper extremity supported Standing balance-Leahy Scale: Fair Standing balance comment: Had pt perform static standing while withstanding external perturbations, EO/EC, narrow BOS-pt had difficulty getting into narrow BOS stance without external support. Once in position, pt could maintain with Min guard assist. Pt was able to pick up object  and perform 360 degree turn(s) with Min guard assist as well.              High level balance activites: Turns;Direction changes;Backward walking High Level Balance Comments: Min guard assist needed.             ADL Overall ADL's : Needs assistance/impaired Eating/Feeding: Independent;Sitting   Grooming: Wash/dry hands;Min guard;Standing   Upper Body Bathing: Set  up;Sitting   Lower Body Bathing: Min guard;Sit to/from stand   Upper Body Dressing : Set up;Sitting   Lower Body Dressing: Min guard;Sit to/from stand   Toilet Transfer: Min guard;Ambulation;Comfort height toilet;Grab bars   Toileting- Clothing Manipulation and Hygiene: Min guard;Sit to/from stand         General ADL Comments: Discussed energy conservation strategies including taking rest breaks, PLB when becoming SOB, pacing self and using stool to prepare meals and chair in bath tub. She has tubseat but doesnt usually use it. Pt agreeable to use seat in tub initially for safety and also to have family assist her with initial showers for safety. Pt did well mobilizing into bathroom to toilet and back to bed.      Vision                     Perception     Praxis      Pertinent Vitals/Pain Pain Assessment: No/denies pain     Hand Dominance     Extremity/Trunk Assessment Upper Extremity Assessment Upper Extremity Assessment: Overall WFL for tasks assessed      Cervical / Trunk Assessment Cervical / Trunk Assessment: Normal   Communication Communication Communication: No difficulties   Cognition Arousal/Alertness: Awake/alert Behavior During Therapy: WFL for tasks assessed/performed Overall Cognitive Status: Within Functional Limits for tasks assessed                     General Comments       Exercises  Shoulder Instructions      Home Living Family/patient expects to be discharged to:: Private residence Living Arrangements: Alone   Type of Home: House Home Access: Level entry     [place]: One level     Bathroom Shower/Tub: Teacher, early years/pre: Standard     Home Equipment: Environmental consultant - 2 wheels;Cane - quad;Grab bars - tub/shower;Shower seat;Toilet riser (O2)          Prior Functioning/Environment Level of Independence: Independent;Needs assistance    ADL's / Homemaking Assistance Needed: has assist with  housekeeping and lawn care        OT Diagnosis: Generalized weakness   OT Problem List: Decreased strength;Decreased knowledge of use of DME or AE   OT Treatment/Interventions: Self-care/ADL training;Patient/family education;Therapeutic activities;DME and/or AE instruction;Energy conservation    OT Goals(Current goals can be found in the care plan section) Acute Rehab OT Goals Patient Stated Goal: return to independence OT Goal Formulation: With patient Time For Goal Achievement: 08/26/14 Potential to Achieve Goals: Good  OT Frequency: Min 2X/week   Barriers to D/C:            Co-evaluation              End of Session Equipment Utilized During Treatment: Oxygen  Activity Tolerance: Patient tolerated treatment well Patient left: with call bell/phone within reach (at EOB with nursing)   Time: 1610-9604 OT Time Calculation (min): 23 min Charges:  OT General Charges $OT Visit: 1 Procedure OT Evaluation $Initial OT Evaluation Tier I: 1 Procedure OT Treatments $Therapeutic Activity: 8-22 mins G-Codes:    Jules Schick [phone] 08/12/2014, 2:15 PM

## 2014-08-13 LAB — GLUCOSE, CAPILLARY
GLUCOSE-CAPILLARY: 252 mg/dL — AB (ref 70–99)
Glucose-Capillary: 295 mg/dL — ABNORMAL HIGH (ref 70–99)

## 2014-08-13 MED ORDER — ENOXAPARIN SODIUM 40 MG/0.4ML ~~LOC~~ SOLN
40.0000 mg | Freq: Once | SUBCUTANEOUS | Status: AC
  Administered 2014-08-13: 40 mg via SUBCUTANEOUS
  Filled 2014-08-13: qty 0.4

## 2014-08-13 MED ORDER — ENOXAPARIN SODIUM 40 MG/0.4ML ~~LOC~~ SOLN
40.0000 mg | Freq: Once | SUBCUTANEOUS | Status: DC
  Filled 2014-08-13: qty 0.4

## 2014-08-13 MED ORDER — LEVOFLOXACIN 500 MG PO TABS: 500.0000 mg | ORAL_TABLET | Freq: Every day | ORAL | Status: DC

## 2014-08-13 MED ORDER — GUAIFENESIN ER 600 MG PO TB12: 1200.0000 mg | ORAL_TABLET | Freq: Two times a day (BID) | ORAL | Status: DC

## 2014-08-13 MED ORDER — PREDNISONE 20 MG PO TABS: 20.0000 mg | ORAL_TABLET | Freq: Every day | ORAL | Status: DC

## 2014-08-13 NOTE — Progress Notes (Signed)
Occupational Therapy Treatment Patient Details Name: Emily Owen MRN: [id number] DOB: 1930-07-10 Today's Date: 08/13/2014    History of present illness 80 yo female admitted with COPD exac, weakness. Hx of COPD, osteoporosis, colon cancer, gout, DM   OT comments  Pt doing well. One unsteady episode with transferring into bathroom but min guard and able self recover. O2 sats on 3L with bathing and toileting 91-94% and encouraged rest and PLB.     Follow Up Recommendations  No OT follow up;Supervision - Intermittent    Equipment Recommendations  None recommended by OT    Recommendations for Other Services      Precautions / Restrictions Precautions Precautions: Fall Precaution Comments: O2 dependent       Mobility Bed Mobility Overal bed mobility: Modified Independent                Transfers Overall transfer level: Needs assistance     Sit to Stand: Supervision              Balance                                   ADL       Grooming: Wash/dry hands;Wash/dry face;Standing;Min guard                   Armed forces technical officer: Min guard;Comfort height toilet;Grab TEFL teacher Details (indicate cue type and reason): pushing O2 tank Toileting- Clothing Manipulation and Hygiene: Supervision/safety;Sitting/lateral lean         General ADL Comments: Pt with an unsteady episode with transferring into bathroom without device but min guard and able to self recover. Note PT recommending to use assistive device for home so reinforced this with pt. Reviewed energy conservation techniques further with pt including  alternating activity with rest , pacing self, PLB, sitting when able.       Vision                     Perception     Praxis      Cognition   Behavior During Therapy: WFL for tasks assessed/performed Overall Cognitive Status: Within Functional Limits for tasks assessed                        Extremity/Trunk Assessment               Exercises     Shoulder Instructions       General Comments      Pertinent Vitals/ Pain       Pain Assessment: No/denies pain  Home Living                                          Prior Functioning/Environment              Frequency Min 2X/week     Progress Toward Goals  OT Goals(current goals can now be found in the care plan section)  Progress towards OT goals: Progressing toward goals     Plan Discharge plan remains appropriate    Co-evaluation                 End of Session Equipment Utilized During Treatment: Oxygen   Activity Tolerance Patient tolerated treatment well   Patient Left with bed  alarm set;with call bell/phone within reach (EOB to eat)   Nurse Communication          Time: 1610-9604 OT Time Calculation (min): 19 min  Charges: OT General Charges $OT Visit: 1 Procedure OT Treatments $Self Care/Home Management : 8-22 mins  Jules Schick [phone] 08/13/2014, 9:20 AM

## 2014-08-13 NOTE — Progress Notes (Addendum)
Pt discharge instructions given. Condition stable.... Pt/dgt verbalized understanding of d/c instructions give. Admitting MD on the unit and request that we give Lovenox prior to leaving as pt will be riding 3 hours home. Pharmacy aware and to put order in for administration... Given as ordered

## 2014-08-13 NOTE — Discharge Instructions (Signed)
Chronic Obstructive Pulmonary Disease Chronic obstructive pulmonary disease (COPD) is a common lung problem. In COPD, the flow of air from the lungs is limited. The way your lungs work will probably never return to normal, but there are things you can do to improve your lungs and make yourself feel better. HOME CARE  Take all medicines as told by your doctor.  Avoid medicines or cough syrups that dry up your airway (such as antihistamines) and do not allow you to get rid of thick spit. You do not need to avoid them if told differently by your doctor.  If you smoke, stop. Smoking makes the problem worse.  Avoid being around things that make your breathing worse (like smoke, chemicals, and fumes).  Use oxygen therapy and therapy to help improve your lungs (pulmonary rehabilitation) if told by your doctor. If you need home oxygen therapy, ask your doctor if you should buy a tool to measure your oxygen level (oximeter).  Avoid people who have a sickness you can catch (contagious).  Avoid going outside when it is very hot, cold, or humid.  Eat healthy foods. Eat smaller meals more often. Rest before meals.  Stay active, but remember to also rest.  Make sure to get all the shots (vaccines) your doctor recommends. Ask your doctor if you need a pneumonia shot.  Learn and use tips on how to relax.  Learn and use tips on how to control your breathing as told by your doctor. Try:  Breathing in (inhaling) through your nose for 1 second. Then, pucker your lips and breath out (exhale) through your lips for 2 seconds.  Putting one hand on your belly (abdomen). Breathe in slowly through your nose for 1 second. Your hand on your belly should move out. Pucker your lips and breathe out slowly through your lips. Your hand on your belly should move in as you breathe out.  Learn and use controlled coughing to clear thick spit from your lungs. The steps are: 1. Lean your head a little forward. 2. Breathe  in deeply. 3. Try to hold your breath for 3 seconds. 4. Keep your mouth slightly open while coughing 2 times. 5. Spit any thick spit out into a tissue. 6. Rest and do the steps again 1 or 2 times as needed. GET HELP IF:  You cough up more thick spit than usual.  There is a change in the color or thickness of the spit.  It is harder to breathe than usual.  Your breathing is faster than usual. GET HELP RIGHT AWAY IF:   You have shortness of breath while resting.  You have shortness of breath that stops you from:  Being able to talk.  Doing normal activities.  You chest hurts for longer than 5 minutes.  Your skin color is more blue than usual.  Your pulse oximeter shows that you have low oxygen for longer than 5 minutes. MAKE SURE YOU:   Understand these instructions.  Will watch your condition.  Will get help right away if you are not doing well or get worse. Document Released: 03/26/2008 Document Revised: 02/22/2014 Document Reviewed: 06/04/2013 ExitCare Patient Information 2015 [employer]. This information is not intended to replace advice given to you by your health care provider. Make sure you discuss any questions you have with your health care provider.  

## 2014-08-13 NOTE — Discharge Summary (Addendum)
Physician Discharge Summary  Emily Owen YTK:[id number] DOB: Jan 02, 1930 DOA: 08/11/2014  PCP: In [place] date: 08/11/2014 Discharge date: 08/13/2014  Time spent: 30 minutes  Recommendations for Outpatient Follow-up:  1. Discharged home with outpatient PCP and pulmonary followup  Discharge Diagnoses:  Principal Problem:   Acute on chronic respiratory failure with hypercapnia  Active Problems:   Diabetes type 2, controlled on diet   Hyperlipidemia   COPD with acute exacerbation   Encephalopathy acute   Gout   HTN (hypertension)   Discharge Condition: Fair  Diet recommendation: Diabetic  Filed Weights   08/11/14 1712 08/12/14 0801 08/13/14 0400  Weight: 82.3 kg (181 lb 7 oz) 83.3 kg (183 lb 10.3 oz) 81.8 kg (180 lb 5.4 oz)    History of present illness:  Please refer to admission H&P for details, but in brief, 80 year old female with history of COPD with chronic respiratory failure on home oxygen 2-3 and half liters via nasal cannula, gout, diet-controlled diabetes mellitus presented with two-day history of shortness of breath with productive sputum and some chest tightness. As per family patient also found to be increasingly lethargic and sleepy. Patient was found to be in hypercarbic respiratory failure with PCO2 of 60. Patient admitted for respiratory failure with COPD exacerbation.   Hospital Course:  Acute hypercarbic respiratory failure with COPD exacerbation  Patient placed on IV Solu Medrol, scheduled nebs, IV Levaquin, Mucinex, oxygen via nasal cannula.  Patient evaluated by physical therapy. She was dropping her O2 sat to 80s on ambulation on day 2 but today maintains o2 sat to 27-96% on 3 L via nasal cannula. Patient's dyspnea has markedly improved and feels back to baseline. I will discharge her on a short tapering course of oral prednisone over next 10-2 days. She will continue with her home inhalers and nebulizer along with  antitussive. I will also prescribe her with 3 more days of oral Levaquin to complete a five-day course. -Patient instructed to continue using her home O2 and medications. She plans to see her PCP in [place] in one week and also see her pulmonologist within 4 weeks  Acute encephalopathy  Secondary to hypercarbic respiratory failure. Now resolved  Diet-controlled diabetic mellitus  Stable  Hypertension  Stable. Continue Norvasc   Gout  Stable    Diet: Diabetic   Code Status: DO NOT RESUSCITATE  Family Communication: Called daughter and left a message  Disposition Plan: Home    Consultants:  None   Procedures:  None   Antibiotics:  Levaquin     Discharge Exam: Filed Vitals:   08/13/14 1106  BP:   Pulse: 71  Temp:   Resp: 20    General: Elderly female in no acute distress HEENT: Moist oral mucosa Chest: Clear to auscultation bilaterally, no added sounds CVS: Normal S1-S2, no murmurs Abdomen: Soft, nondistended, nontender, bowel sounds present Extremities: Warm, no edema   Discharge Instructions You were cared for by a hospitalist during your hospital stay. If you have any questions about your discharge medications or the care you received while you were in the hospital after you are discharged, you can call the unit and asked to speak with the hospitalist on call if the hospitalist that took care of you is not available. Once you are discharged, your primary care physician will handle any further medical issues. Please note that NO REFILLS for any discharge medications will be authorized once you are discharged, as it is imperative that you return to your primary care  physician (or establish a relationship with a primary care physician if you do not have one) for your aftercare needs so that they can reassess your need for medications and monitor your lab values.   Current Discharge Medication List    START taking these medications   Details  guaiFENesin  (MUCINEX) 600 MG 12 hr tablet Take 2 tablets (1,200 mg total) by mouth 2 (two) times daily. Qty: 10 tablet, Refills: 0    levofloxacin (LEVAQUIN) 500 MG tablet Take 1 tablet (500 mg total) by mouth daily. Qty: 3 tablet, Refills: 0 until 10/20/ 2015     predniSONE (DELTASONE) 20 MG tablet Take 1 tablet (20 mg total) by mouth daily with breakfast. Qty: 16 tablet, Refills: 0                                                                      40 mg daily for 3 days followed by 30 mg daily for next 3 days followed by 20 mg daily for next 3 days followed by 10 mg daily for next 3 days and stop     CONTINUE these medications which have NOT CHANGED   Details  albuterol (PROVENTIL HFA;VENTOLIN HFA) 108 (90 BASE) MCG/ACT inhaler Inhale 1 puff into the lungs every 6 (six) hours as needed for wheezing or shortness of breath.    albuterol (PROVENTIL) (2.5 MG/3ML) 0.083% nebulizer solution Take 2.5 mg by nebulization every 6 (six) hours as needed for wheezing or shortness of breath.    amLODipine (NORVASC) 5 MG tablet Take 5 mg by mouth daily.    aspirin EC 81 MG tablet Take 81 mg by mouth daily.    Cholecalciferol (VITAMIN D) 2000 UNITS CAPS Take 2,000 Units by mouth daily.    fish oil-omega-3 fatty acids 1000 MG capsule Take 2 g by mouth 2 (two) times daily.    Influenza Vac Split Quad (FLUZONE) 0.25 ML injection Inject 0.25 mLs into the muscle once.    Ipratropium-Albuterol (COMBIVENT RESPIMAT) 20-100 MCG/ACT AERS respimat Inhale 1 puff into the lungs 4 (four) times daily as needed for wheezing.    oxyCODONE-acetaminophen (PERCOCET/ROXICET) 5-325 MG per tablet Take 0.25 tablets by mouth every 4 (four) hours as needed for severe pain.    rosuvastatin (CRESTOR) 20 MG tablet Take 20 mg by mouth at bedtime.       No Known Allergies Follow-up Information   Follow up with [facility]. (PCP in [place], [place]. Patient will followup in one week)        The results of significant  diagnostics from this hospitalization (including imaging, microbiology, ancillary and laboratory) are listed below for reference.    Significant Diagnostic Studies: Dg Chest 2 View  08/11/2014   CLINICAL DATA:  Several day history of shortness of breath ; history of similar episodes in the past as well as history of multiple episodes of pneumonia ; history of emphysema.  EXAM: CHEST  2 VIEW  COMPARISON:  Chest x-ray of August 20, 2012  FINDINGS: The lungs are hyperinflated. There is no focal infiltrate. There is stable density overlying the right lateral costophrenic gutter. The cardiac silhouette is mildly enlarged. The pulmonary vascularity is not engorged. The interstitial markings are mildly prominent though stable. The  mediastinum is normal in width. There is no pleural effusion or pneumothorax. The bony thorax is unremarkable.  IMPRESSION: COPD. There is no evidence of pneumonia nor CHF nor other acute cardiopulmonary abnormality.   Electronically Signed   By: David  Martinique   On: 08/11/2014 14:44    Microbiology: No results found for this or any previous visit (from the past 240 hour(s)).   Labs: Basic Metabolic Panel:  Recent Labs Lab 08/11/14 1307 08/11/14 1829 08/12/14 0345  NA 137  --  136*  K 4.4  --  5.1  CL 92*  --  93*  CO2 33*  --  33*  GLUCOSE 170*  --  238*  BUN 25*  --  30*  CREATININE 0.91  --  0.91  CALCIUM 9.3  --  9.8  MG  --  1.8  --    Liver Function Tests:  Recent Labs Lab 08/11/14 1307  AST 12  ALT 10  ALKPHOS 74  BILITOT 0.7  PROT 7.5  ALBUMIN 3.4*    Recent Labs Lab 08/11/14 1307  LIPASE 20   No results found for this basename: AMMONIA,  in the last 168 hours CBC:  Recent Labs Lab 08/11/14 1307 08/12/14 0345  WBC 9.8 6.4  NEUTROABS 8.0*  --   HGB 12.5 12.0  HCT 40.0 37.8  MCV 86.0 84.0  PLT 147* 160   Cardiac Enzymes: No results found for this basename: CKTOTAL, CKMB, CKMBINDEX, TROPONINI,  in the last 168 hours BNP: BNP  (last 3 results)  Recent Labs  08/11/14 1307  PROBNP 106.5   CBG:  Recent Labs Lab 08/12/14 0804 08/12/14 1221 08/12/14 1740 08/13/14 0845 08/13/14 1220  GLUCAP 248* 326* 227* 252* 295*       Signed:  Sindy Mccune  [facility] 08/13/2014, 12:33 PM

## 2014-08-13 NOTE — Progress Notes (Signed)
Physical Therapy Treatment Patient Details Name: Emily Owen MRN: [id number] DOB: Sep 16, 1930 Today's Date: 08-20-2014    History of Present Illness 80 yo female admitted with COPD exac, weakness. Hx of COPD, osteoporosis, colon cancer, gout, DM    PT Comments    Patient was seen in bed on 3L O2.  She was assisted OOB and with gait training while on O2; sats monitored; ranged from 87-96%.  Patient demos good awareness of safety and pursed lip breathing; good activity tolerance with 200 ft gait.    Follow Up Recommendations  Home health PT     Equipment Recommendations  None recommended by PT    Recommendations for Other Services       Precautions / Restrictions Precautions Precautions: None Precaution Comments: O2 dependent    Mobility  Bed Mobility Overal bed mobility: Modified Independent             General bed mobility comments: slightly increased time  Transfers Overall transfer level: Needs assistance Equipment used: 4-wheeled walker Transfers: Sit to/from Stand Sit to Stand: Min guard;Supervision         General transfer comment: VC's for safety and RW management; 25%   Ambulation/Gait Ambulation/Gait assistance: Min guard;Supervision Ambulation Distance (Feet): 200 Feet Assistive device: 4-wheeled walker Gait Pattern/deviations: Step-through pattern Gait velocity: at prior level    General Gait Details: amulated on 3 L; O2 sats ranged from 87-96%; educated on pursed lip breathing, rest breaks; pt shows good awareness with safety and tolerance.    Stairs            Wheelchair Mobility    Modified Rankin (Stroke Patients Only)       Balance                                    Cognition Arousal/Alertness: Awake/alert Behavior During Therapy: WFL for tasks assessed/performed Overall Cognitive Status: Within Functional Limits for tasks assessed                      Exercises      General Comments         Pertinent Vitals/Pain Pain Assessment: No/denies pain    Home Living                      Prior Function            PT Goals (current goals can now be found in the care plan section) Progress towards PT goals: Progressing toward goals    Frequency  Min 3X/week    PT Plan      Co-evaluation             End of Session Equipment Utilized During Treatment: Gait belt;Oxygen Activity Tolerance: Patient tolerated treatment well Patient left: in bed;with call bell/phone within reach     Time:  - 11:32 - 29;52    Charges:    1 gt    1 ta                    G Codes:      Miller,Derrick 2014/08/20, 12:18 PM  Reviewed above PT PN written by PTA student  Rica Koyanagi  PTA [facility]  Acute  Rehab Pager      [phone]

## 2014-08-17 DIAGNOSIS — H3530 Unspecified macular degeneration: Secondary | ICD-10-CM | POA: Diagnosis not present

## 2014-08-17 DIAGNOSIS — H3532 Exudative age-related macular degeneration: Secondary | ICD-10-CM | POA: Diagnosis not present

## 2014-08-17 DIAGNOSIS — H3562 Retinal hemorrhage, left eye: Secondary | ICD-10-CM | POA: Diagnosis not present

## 2014-08-17 DIAGNOSIS — Z961 Presence of intraocular lens: Secondary | ICD-10-CM | POA: Diagnosis not present

## 2014-08-17 DIAGNOSIS — H35352 Cystoid macular degeneration, left eye: Secondary | ICD-10-CM | POA: Diagnosis not present

## 2014-08-19 DIAGNOSIS — H43813 Vitreous degeneration, bilateral: Secondary | ICD-10-CM | POA: Diagnosis not present

## 2014-08-19 DIAGNOSIS — H3531 Nonexudative age-related macular degeneration: Secondary | ICD-10-CM | POA: Diagnosis not present

## 2014-08-19 DIAGNOSIS — H3532 Exudative age-related macular degeneration: Secondary | ICD-10-CM | POA: Diagnosis not present

## 2014-08-25 DIAGNOSIS — M069 Rheumatoid arthritis, unspecified: Secondary | ICD-10-CM | POA: Diagnosis not present

## 2014-08-25 DIAGNOSIS — J440 Chronic obstructive pulmonary disease with acute lower respiratory infection: Secondary | ICD-10-CM | POA: Diagnosis not present

## 2014-08-25 DIAGNOSIS — E119 Type 2 diabetes mellitus without complications: Secondary | ICD-10-CM | POA: Diagnosis not present

## 2014-09-20 DIAGNOSIS — H3532 Exudative age-related macular degeneration: Secondary | ICD-10-CM | POA: Diagnosis not present

## 2014-10-19 ENCOUNTER — Encounter (INDEPENDENT_AMBULATORY_CARE_PROVIDER_SITE_OTHER): Payer: Medicare Other | Admitting: Ophthalmology

## 2014-10-19 DIAGNOSIS — H3531 Nonexudative age-related macular degeneration: Secondary | ICD-10-CM

## 2014-10-19 DIAGNOSIS — E11319 Type 2 diabetes mellitus with unspecified diabetic retinopathy without macular edema: Secondary | ICD-10-CM

## 2014-10-19 DIAGNOSIS — E11329 Type 2 diabetes mellitus with mild nonproliferative diabetic retinopathy without macular edema: Secondary | ICD-10-CM

## 2014-10-19 DIAGNOSIS — H3532 Exudative age-related macular degeneration: Secondary | ICD-10-CM

## 2014-10-19 DIAGNOSIS — H43813 Vitreous degeneration, bilateral: Secondary | ICD-10-CM | POA: Diagnosis not present

## 2014-11-22 ENCOUNTER — Encounter (INDEPENDENT_AMBULATORY_CARE_PROVIDER_SITE_OTHER): Payer: Medicare Other | Admitting: Ophthalmology

## 2014-11-22 DIAGNOSIS — H3531 Nonexudative age-related macular degeneration: Secondary | ICD-10-CM | POA: Diagnosis not present

## 2014-11-22 DIAGNOSIS — H3532 Exudative age-related macular degeneration: Secondary | ICD-10-CM

## 2014-11-22 DIAGNOSIS — E11329 Type 2 diabetes mellitus with mild nonproliferative diabetic retinopathy without macular edema: Secondary | ICD-10-CM | POA: Diagnosis not present

## 2014-11-22 DIAGNOSIS — E11319 Type 2 diabetes mellitus with unspecified diabetic retinopathy without macular edema: Secondary | ICD-10-CM | POA: Diagnosis not present

## 2014-11-22 DIAGNOSIS — H43813 Vitreous degeneration, bilateral: Secondary | ICD-10-CM

## 2014-11-22 DIAGNOSIS — E11331 Type 2 diabetes mellitus with moderate nonproliferative diabetic retinopathy with macular edema: Secondary | ICD-10-CM | POA: Diagnosis not present

## 2014-12-20 ENCOUNTER — Encounter (INDEPENDENT_AMBULATORY_CARE_PROVIDER_SITE_OTHER): Payer: Medicare Other | Admitting: Ophthalmology

## 2014-12-20 DIAGNOSIS — H3532 Exudative age-related macular degeneration: Secondary | ICD-10-CM | POA: Diagnosis not present

## 2014-12-20 DIAGNOSIS — H43813 Vitreous degeneration, bilateral: Secondary | ICD-10-CM

## 2014-12-20 DIAGNOSIS — E11331 Type 2 diabetes mellitus with moderate nonproliferative diabetic retinopathy with macular edema: Secondary | ICD-10-CM

## 2014-12-20 DIAGNOSIS — E11319 Type 2 diabetes mellitus with unspecified diabetic retinopathy without macular edema: Secondary | ICD-10-CM | POA: Diagnosis not present

## 2014-12-20 DIAGNOSIS — E11329 Type 2 diabetes mellitus with mild nonproliferative diabetic retinopathy without macular edema: Secondary | ICD-10-CM

## 2014-12-20 DIAGNOSIS — H3531 Nonexudative age-related macular degeneration: Secondary | ICD-10-CM | POA: Diagnosis not present

## 2015-01-10 ENCOUNTER — Encounter (INDEPENDENT_AMBULATORY_CARE_PROVIDER_SITE_OTHER): Payer: Medicare Other | Admitting: Ophthalmology

## 2015-01-10 DIAGNOSIS — H3532 Exudative age-related macular degeneration: Secondary | ICD-10-CM | POA: Diagnosis not present

## 2015-01-10 DIAGNOSIS — H3531 Nonexudative age-related macular degeneration: Secondary | ICD-10-CM | POA: Diagnosis not present

## 2015-01-10 DIAGNOSIS — H43813 Vitreous degeneration, bilateral: Secondary | ICD-10-CM | POA: Diagnosis not present

## 2015-01-10 DIAGNOSIS — E11319 Type 2 diabetes mellitus with unspecified diabetic retinopathy without macular edema: Secondary | ICD-10-CM | POA: Diagnosis not present

## 2015-01-10 DIAGNOSIS — E11329 Type 2 diabetes mellitus with mild nonproliferative diabetic retinopathy without macular edema: Secondary | ICD-10-CM | POA: Diagnosis not present

## 2015-01-20 DIAGNOSIS — E119 Type 2 diabetes mellitus without complications: Secondary | ICD-10-CM | POA: Diagnosis not present

## 2015-01-20 DIAGNOSIS — E782 Mixed hyperlipidemia: Secondary | ICD-10-CM | POA: Diagnosis not present

## 2015-01-28 DIAGNOSIS — Z0000 Encounter for general adult medical examination without abnormal findings: Secondary | ICD-10-CM | POA: Diagnosis not present

## 2015-01-28 DIAGNOSIS — E119 Type 2 diabetes mellitus without complications: Secondary | ICD-10-CM | POA: Diagnosis not present

## 2015-01-28 DIAGNOSIS — E782 Mixed hyperlipidemia: Secondary | ICD-10-CM | POA: Diagnosis not present

## 2015-01-28 DIAGNOSIS — J42 Unspecified chronic bronchitis: Secondary | ICD-10-CM | POA: Diagnosis not present

## 2015-02-11 DIAGNOSIS — Z1231 Encounter for screening mammogram for malignant neoplasm of breast: Secondary | ICD-10-CM | POA: Diagnosis not present

## 2015-02-11 LAB — HM MAMMOGRAPHY

## 2015-02-14 ENCOUNTER — Encounter (INDEPENDENT_AMBULATORY_CARE_PROVIDER_SITE_OTHER): Payer: Medicare Other | Admitting: Ophthalmology

## 2015-02-14 DIAGNOSIS — H3531 Nonexudative age-related macular degeneration: Secondary | ICD-10-CM | POA: Diagnosis not present

## 2015-02-14 DIAGNOSIS — E11319 Type 2 diabetes mellitus with unspecified diabetic retinopathy without macular edema: Secondary | ICD-10-CM

## 2015-02-14 DIAGNOSIS — H43813 Vitreous degeneration, bilateral: Secondary | ICD-10-CM | POA: Diagnosis not present

## 2015-02-14 DIAGNOSIS — H3532 Exudative age-related macular degeneration: Secondary | ICD-10-CM | POA: Diagnosis not present

## 2015-02-14 DIAGNOSIS — E11329 Type 2 diabetes mellitus with mild nonproliferative diabetic retinopathy without macular edema: Secondary | ICD-10-CM

## 2015-03-08 DIAGNOSIS — L84 Corns and callosities: Secondary | ICD-10-CM | POA: Diagnosis not present

## 2015-03-08 DIAGNOSIS — E119 Type 2 diabetes mellitus without complications: Secondary | ICD-10-CM | POA: Diagnosis not present

## 2015-03-08 DIAGNOSIS — B351 Tinea unguium: Secondary | ICD-10-CM | POA: Diagnosis not present

## 2015-03-14 ENCOUNTER — Encounter (INDEPENDENT_AMBULATORY_CARE_PROVIDER_SITE_OTHER): Payer: Medicare Other | Admitting: Ophthalmology

## 2015-03-14 DIAGNOSIS — E11319 Type 2 diabetes mellitus with unspecified diabetic retinopathy without macular edema: Secondary | ICD-10-CM

## 2015-03-14 DIAGNOSIS — H3532 Exudative age-related macular degeneration: Secondary | ICD-10-CM | POA: Diagnosis not present

## 2015-03-14 DIAGNOSIS — I10 Essential (primary) hypertension: Secondary | ICD-10-CM | POA: Diagnosis not present

## 2015-03-14 DIAGNOSIS — E11321 Type 2 diabetes mellitus with mild nonproliferative diabetic retinopathy with macular edema: Secondary | ICD-10-CM

## 2015-03-14 DIAGNOSIS — H43813 Vitreous degeneration, bilateral: Secondary | ICD-10-CM | POA: Diagnosis not present

## 2015-03-14 DIAGNOSIS — E11329 Type 2 diabetes mellitus with mild nonproliferative diabetic retinopathy without macular edema: Secondary | ICD-10-CM

## 2015-03-14 DIAGNOSIS — H3531 Nonexudative age-related macular degeneration: Secondary | ICD-10-CM

## 2015-03-14 DIAGNOSIS — H35033 Hypertensive retinopathy, bilateral: Secondary | ICD-10-CM | POA: Diagnosis not present

## 2015-03-22 ENCOUNTER — Emergency Department (HOSPITAL_COMMUNITY): Payer: Medicare Other

## 2015-03-22 ENCOUNTER — Encounter (HOSPITAL_COMMUNITY): Payer: Self-pay | Admitting: Emergency Medicine

## 2015-03-22 ENCOUNTER — Other Ambulatory Visit: Payer: Self-pay

## 2015-03-22 ENCOUNTER — Other Ambulatory Visit (HOSPITAL_COMMUNITY): Payer: Self-pay

## 2015-03-22 ENCOUNTER — Inpatient Hospital Stay (HOSPITAL_COMMUNITY)
Admission: EM | Admit: 2015-03-22 | Discharge: 2015-03-23 | DRG: 190 | Disposition: A | Discharge status: Home / Self Care | Payer: Medicare Other | Attending: Internal Medicine | Admitting: Internal Medicine

## 2015-03-22 DIAGNOSIS — M810 Age-related osteoporosis without current pathological fracture: Secondary | ICD-10-CM | POA: Diagnosis present

## 2015-03-22 DIAGNOSIS — Z87891 Personal history of nicotine dependence: Secondary | ICD-10-CM | POA: Diagnosis not present

## 2015-03-22 DIAGNOSIS — H9190 Unspecified hearing loss, unspecified ear: Secondary | ICD-10-CM | POA: Diagnosis present

## 2015-03-22 DIAGNOSIS — Z90710 Acquired absence of both cervix and uterus: Secondary | ICD-10-CM

## 2015-03-22 DIAGNOSIS — I10 Essential (primary) hypertension: Secondary | ICD-10-CM | POA: Diagnosis present

## 2015-03-22 DIAGNOSIS — J441 Chronic obstructive pulmonary disease with (acute) exacerbation: Secondary | ICD-10-CM | POA: Diagnosis not present

## 2015-03-22 DIAGNOSIS — J154 Pneumonia due to other streptococci: Secondary | ICD-10-CM | POA: Diagnosis present

## 2015-03-22 DIAGNOSIS — Z8541 Personal history of malignant neoplasm of cervix uteri: Secondary | ICD-10-CM | POA: Diagnosis not present

## 2015-03-22 DIAGNOSIS — M109 Gout, unspecified: Secondary | ICD-10-CM | POA: Diagnosis present

## 2015-03-22 DIAGNOSIS — J410 Simple chronic bronchitis: Secondary | ICD-10-CM

## 2015-03-22 DIAGNOSIS — E119 Type 2 diabetes mellitus without complications: Secondary | ICD-10-CM | POA: Diagnosis present

## 2015-03-22 DIAGNOSIS — J9601 Acute respiratory failure with hypoxia: Secondary | ICD-10-CM | POA: Diagnosis not present

## 2015-03-22 DIAGNOSIS — J9621 Acute and chronic respiratory failure with hypoxia: Secondary | ICD-10-CM | POA: Diagnosis present

## 2015-03-22 DIAGNOSIS — Z9981 Dependence on supplemental oxygen: Secondary | ICD-10-CM | POA: Diagnosis not present

## 2015-03-22 DIAGNOSIS — R0600 Dyspnea, unspecified: Secondary | ICD-10-CM | POA: Diagnosis present

## 2015-03-22 DIAGNOSIS — J449 Chronic obstructive pulmonary disease, unspecified: Secondary | ICD-10-CM | POA: Diagnosis present

## 2015-03-22 DIAGNOSIS — E785 Hyperlipidemia, unspecified: Secondary | ICD-10-CM | POA: Diagnosis present

## 2015-03-22 DIAGNOSIS — J90 Pleural effusion, not elsewhere classified: Secondary | ICD-10-CM | POA: Diagnosis present

## 2015-03-22 DIAGNOSIS — J189 Pneumonia, unspecified organism: Secondary | ICD-10-CM | POA: Diagnosis present

## 2015-03-22 DIAGNOSIS — R0602 Shortness of breath: Secondary | ICD-10-CM | POA: Diagnosis not present

## 2015-03-22 HISTORY — DX: Unspecified hearing loss, unspecified ear: H91.90

## 2015-03-22 LAB — PROTIME-INR
INR: 1.07 (ref 0.00–1.49)
Prothrombin Time: 14.1 seconds (ref 11.6–15.2)

## 2015-03-22 LAB — BASIC METABOLIC PANEL
ANION GAP: 8 (ref 5–15)
BUN: 27 mg/dL — AB (ref 6–20)
CALCIUM: 9.1 mg/dL (ref 8.9–10.3)
CO2: 38 mmol/L — AB (ref 22–32)
CREATININE: 0.81 mg/dL (ref 0.44–1.00)
Chloride: 97 mmol/L — ABNORMAL LOW (ref 101–111)
GFR calc Af Amer: >60 mL/min (ref >60)
GFR calc non Af Amer: >60 mL/min (ref >60)
Glucose, Bld: 172 mg/dL — ABNORMAL HIGH (ref 65–99)
Potassium: 4.6 mmol/L (ref 3.5–5.1)
Sodium: 143 mmol/L (ref 135–145)

## 2015-03-22 LAB — CBC WITH DIFFERENTIAL/PLATELET
BASOS ABS: 0 10*3/uL (ref 0.0–0.1)
BASOS PCT: 0 % (ref 0–1)
Basophils Absolute: 0 10*3/uL (ref 0.0–0.1)
Basophils Relative: 0 % (ref 0–1)
Eosinophils Absolute: 0.4 10*3/uL (ref 0.0–0.7)
Eosinophils Absolute: 0.1 10*3/uL (ref 0.0–0.7)
Eosinophils Relative: 5 % (ref 0–5)
Eosinophils Relative: 1 % (ref 0–5)
HCT: 40.2 % (ref 36.0–46.0)
HEMATOCRIT: 40 % (ref 36.0–46.0)
HEMOGLOBIN: 11.8 g/dL — AB (ref 12.0–15.0)
Hemoglobin: 11.7 g/dL — ABNORMAL LOW (ref 12.0–15.0)
Lymphocytes Relative: 15 % (ref 12–46)
Lymphocytes Relative: 7 % — ABNORMAL LOW (ref 12–46)
Lymphs Abs: 1.1 10*3/uL (ref 0.7–4.0)
Lymphs Abs: 0.7 10*3/uL (ref 0.7–4.0)
MCH: 25.8 pg — AB (ref 26.0–34.0)
MCH: 25.7 pg — ABNORMAL LOW (ref 26.0–34.0)
MCHC: 29.4 g/dL — AB (ref 30.0–36.0)
MCHC: 29.3 g/dL — AB (ref 30.0–36.0)
MCV: 87.8 fL (ref 78.0–100.0)
MCV: 87.9 fL (ref 78.0–100.0)
Monocytes Absolute: 0.5 10*3/uL (ref 0.1–1.0)
Monocytes Absolute: 0.2 10*3/uL (ref 0.1–1.0)
Monocytes Relative: 7 % (ref 3–12)
Monocytes Relative: 2 % — ABNORMAL LOW (ref 3–12)
NEUTROS PCT: 73 % (ref 43–77)
NEUTROS PCT: 90 % — AB (ref 43–77)
Neutro Abs: 5.3 10*3/uL (ref 1.7–7.7)
Neutro Abs: 8.3 10*3/uL — ABNORMAL HIGH (ref 1.7–7.7)
Platelets: 191 10*3/uL (ref 150–400)
Platelets: 205 10*3/uL (ref 150–400)
RBC: 4.58 MIL/uL (ref 3.87–5.11)
RBC: 4.55 MIL/uL (ref 3.87–5.11)
RDW: 14.6 % (ref 11.5–15.5)
RDW: 14.6 % (ref 11.5–15.5)
WBC: 7.3 10*3/uL (ref 4.0–10.5)
WBC: 9.3 10*3/uL (ref 4.0–10.5)

## 2015-03-22 LAB — COMPREHENSIVE METABOLIC PANEL
ALK PHOS: 74 U/L (ref 38–126)
ALT: 11 U/L — ABNORMAL LOW (ref 14–54)
AST: 15 U/L (ref 15–41)
Albumin: 4 g/dL (ref 3.5–5.0)
Anion gap: 12 (ref 5–15)
BUN: 26 mg/dL — ABNORMAL HIGH (ref 6–20)
CALCIUM: 9 mg/dL (ref 8.9–10.3)
CO2: 36 mmol/L — ABNORMAL HIGH (ref 22–32)
CREATININE: 0.75 mg/dL (ref 0.44–1.00)
Chloride: 95 mmol/L — ABNORMAL LOW (ref 101–111)
GFR calc non Af Amer: >60 mL/min (ref >60)
GLUCOSE: 211 mg/dL — AB (ref 65–99)
Potassium: 4 mmol/L (ref 3.5–5.1)
Sodium: 143 mmol/L (ref 135–145)
Total Bilirubin: 0.4 mg/dL (ref 0.3–1.2)
Total Protein: 7.1 g/dL (ref 6.5–8.1)

## 2015-03-22 LAB — APTT: aPTT: 34 seconds (ref 24–37)

## 2015-03-22 LAB — TROPONIN I: Troponin I: <0.03 ng/mL (ref <0.031)

## 2015-03-22 LAB — TSH: TSH: 1.392 u[IU]/mL (ref 0.350–4.500)

## 2015-03-22 LAB — MAGNESIUM: Magnesium: 1.7 mg/dL (ref 1.7–2.4)

## 2015-03-22 LAB — STREP PNEUMONIAE URINARY ANTIGEN: STREP PNEUMO URINARY ANTIGEN: NEGATIVE

## 2015-03-22 LAB — PHOSPHORUS: Phosphorus: 3.5 mg/dL (ref 2.5–4.6)

## 2015-03-22 MED ORDER — SODIUM CHLORIDE 0.9 % IJ SOLN
3.0000 mL | Freq: Two times a day (BID) | INTRAMUSCULAR | Status: DC
Start: 2015-03-22 — End: 2015-03-23
  Administered 2015-03-22: 3 mL via INTRAVENOUS

## 2015-03-22 MED ORDER — ONDANSETRON HCL 4 MG PO TABS: 4.0000 mg | ORAL_TABLET | Freq: Four times a day (QID) | ORAL | Status: DC | PRN

## 2015-03-22 MED ORDER — IPRATROPIUM-ALBUTEROL 0.5-2.5 (3) MG/3ML IN SOLN
3.0000 mL | RESPIRATORY_TRACT | Status: DC
  Administered 2015-03-22 – 2015-03-23 (×5): 3 mL via RESPIRATORY_TRACT
  Filled 2015-03-22 (×5): qty 3

## 2015-03-22 MED ORDER — SODIUM CHLORIDE 0.9 % IV SOLN: INTRAVENOUS | Status: DC

## 2015-03-22 MED ORDER — OMEGA-3-ACID ETHYL ESTERS 1 G PO CAPS
2.0000 g | ORAL_CAPSULE | Freq: Every day | ORAL | Status: DC
  Administered 2015-03-22 – 2015-03-23 (×2): 2 g via ORAL
  Filled 2015-03-22 (×2): qty 2

## 2015-03-22 MED ORDER — CHLORHEXIDINE GLUCONATE 0.12 % MT SOLN
15.0000 mL | Freq: Two times a day (BID) | OROMUCOSAL | Status: DC
  Administered 2015-03-22 – 2015-03-23 (×2): 15 mL via OROMUCOSAL
  Filled 2015-03-22 (×3): qty 15

## 2015-03-22 MED ORDER — VITAMIN D 1000 UNITS PO TABS
2000.0000 [IU] | ORAL_TABLET | Freq: Every day | ORAL | Status: DC
Start: 2015-03-22 — End: 2015-03-23
  Administered 2015-03-22 – 2015-03-23 (×2): 2000 [IU] via ORAL
  Filled 2015-03-22 (×2): qty 2

## 2015-03-22 MED ORDER — CEFTRIAXONE SODIUM IN DEXTROSE 20 MG/ML IV SOLN
1.0000 g | INTRAVENOUS | Status: DC
  Administered 2015-03-22: 1 g via INTRAVENOUS
  Filled 2015-03-22 (×2): qty 50

## 2015-03-22 MED ORDER — IPRATROPIUM-ALBUTEROL 0.5-2.5 (3) MG/3ML IN SOLN: 3.0000 mL | RESPIRATORY_TRACT | Status: DC | PRN

## 2015-03-22 MED ORDER — DEXTROSE 5 % IV SOLN
500.0000 mg | INTRAVENOUS | Status: DC
  Administered 2015-03-22: 500 mg via INTRAVENOUS
  Filled 2015-03-22 (×2): qty 500

## 2015-03-22 MED ORDER — AMLODIPINE BESYLATE 5 MG PO TABS
5.0000 mg | ORAL_TABLET | Freq: Every day | ORAL | Status: DC
  Administered 2015-03-22 – 2015-03-23 (×2): 5 mg via ORAL
  Filled 2015-03-22 (×2): qty 1

## 2015-03-22 MED ORDER — METHYLPREDNISOLONE SODIUM SUCC 125 MG IJ SOLR
60.0000 mg | Freq: Two times a day (BID) | INTRAMUSCULAR | Status: DC
  Administered 2015-03-23: 60 mg via INTRAVENOUS
  Filled 2015-03-22 (×3): qty 0.96

## 2015-03-22 MED ORDER — ALBUTEROL (5 MG/ML) CONTINUOUS INHALATION SOLN
10.0000 mg/h | INHALATION_SOLUTION | Freq: Once | RESPIRATORY_TRACT | Status: AC
  Administered 2015-03-22: 10 mg/h via RESPIRATORY_TRACT
  Filled 2015-03-22: qty 20

## 2015-03-22 MED ORDER — ROSUVASTATIN CALCIUM 20 MG PO TABS
20.0000 mg | ORAL_TABLET | Freq: Every day | ORAL | Status: DC
  Administered 2015-03-22: 20 mg via ORAL
  Filled 2015-03-22 (×2): qty 1

## 2015-03-22 MED ORDER — ASPIRIN EC 81 MG PO TBEC
81.0000 mg | DELAYED_RELEASE_TABLET | Freq: Every day | ORAL | Status: DC
  Administered 2015-03-22 – 2015-03-23 (×2): 81 mg via ORAL
  Filled 2015-03-22 (×2): qty 1

## 2015-03-22 MED ORDER — METHYLPREDNISOLONE SODIUM SUCC 125 MG IJ SOLR
125.0000 mg | Freq: Once | INTRAMUSCULAR | Status: AC
  Administered 2015-03-22: 125 mg via INTRAVENOUS
  Filled 2015-03-22: qty 2

## 2015-03-22 MED ORDER — ONDANSETRON HCL 4 MG/2ML IJ SOLN: 4.0000 mg | Freq: Four times a day (QID) | INTRAMUSCULAR | Status: DC | PRN

## 2015-03-22 MED ORDER — ACETAMINOPHEN 325 MG PO TABS: 650.0000 mg | ORAL_TABLET | Freq: Four times a day (QID) | ORAL | Status: DC | PRN

## 2015-03-22 MED ORDER — ACETAMINOPHEN 650 MG RE SUPP: 650.0000 mg | Freq: Four times a day (QID) | RECTAL | Status: DC | PRN

## 2015-03-22 MED ORDER — OXYCODONE-ACETAMINOPHEN 5-325 MG PO TABS: 0.2500 | ORAL_TABLET | ORAL | Status: DC | PRN

## 2015-03-22 NOTE — ED Notes (Addendum)
Pt has COPD and on continuous O2 3L via nasal canula. Pt has no PCP here in area since she has moved here from [place] [employer] [place].  Pt has increased shob and exertional dyspnea since April when she last saw here PCP in [place]. Pt states on her O2 her normal sat level is 93-94%.

## 2015-03-22 NOTE — ED Notes (Signed)
Pt stand up to pull her pants up and got very short of breath, O2 dropped down to 84% on 4L via n/c.

## 2015-03-22 NOTE — H&P (Signed)
[facility] History and Physical  Talli Kimmer VHQ:[id number] DOB: 03-May-1930 DOA: 03/22/2015  Referring physician: ER physician: Dr. Delon [place] PCP: PROVIDER NOT IN SYSTEMPt just moved to the area and looking for  PCP  Chief Complaint: shortness of breath   HPI:  81 year old female with past medical history of hypertension, dyslipidemia, COPD, has recently moved to this area from [place] and has not yet had a chance to establish a PCP here. She presented to [facility] ED with worsening shortness of breath, intermittent productive cough with yellowish sputum over past 1-2 days prior to this admission. Patient reports using nebulizer treatment as prescribed but it has not improved symptoms significantly. Shortness of breath was present at rest and with exertion. She does not recall having a fever or chills. No chest pain. No abdominal pain, nausea or vomiting. No reports of diarrhea. No blood in stool urine. No urinary complaints. No lightheadedness or falls. No recent exposure to sick contacts.   In ED, pt was hemodynamically stable. She was given nebulizer treatments, solumedrol 125 mg IV once but still oxygen saturation in upper 80's to low 90's with 4 L [place] oxygen support. CXR showed right pleural effusion. Pt admitted for further evaluation of right pleural effusion and management of COPD exacerbation.    Assessment & Plan    Principal Problem:   Acute respiratory failure with hypoxia /  Acute exacerbation of chronic obstructive pulmonary disease (COPD) / right pleural effusion  - Hypoxia likely due to combination of COPD exacerbation and right pleural effusion - CXR on the admission showed moderate right pleural effusion  - Respiratory status stable at this time - Will place order for US thoracentesis with complete fluid analysis including cytology - Order placed for duoneb every 4 hours scheduled and every 4 hours as needed for shortness of breath or wheezing  - Continue  solumedrol 60 mg IV Q 12 hours; she already received 125 mg solumedrol IV once  - Continue oxygen support via [place] to keep O2 sats above 90%  Active Problems:   CAP (community acquired pneumonia) / Possible lobar pneumonia  - Possible pneumonia although no significant findings of pneumonia on CXR - Pt did have ongoing intermittent cough productive of yellowish sputum - Started azithromycin and rocephin clinically - Follow up blood culture results, resp culture result, legionella, strep pneumonia results    Hyperlipidemia - Resume Crestor     Essential HTN (hypertension) - Resume Norvasc     DVT prophylaxis:  - SCD's bilaterally    Radiological Exams on Admission: Dg Chest 2 View 03/22/2015   CLINICAL DATA:  Shortness of breath for 1 month. On home oxygen. History of emphysema. Initial encounter.  EXAM: CHEST  2 VIEW  COMPARISON:  Radiographs 08/11/2014.  FINDINGS: New moderate size right pleural effusion may be partially loculated inferolaterally. There is associated right basilar pulmonary opacity, likely atelectasis. The left lung is clear. The lungs are hyperinflated with chronic central airway thickening. The visualized heart size and mediastinal contours are stable. Thoracic spine degenerative changes are stable.  IMPRESSION: New moderate size right pleural effusion, potentially partially loculated. If the etiology for this is not known clinically, thoracentesis should be considered.   Electronically Signed   By: Richardean Sale M.D.   On: 03/22/2015 14:45    EKG: I have personally reviewed EKG. EKG shows sinus rhythm   Code Status: Full Family Communication: Plan of care discussed with the patient and her daughter  Disposition Plan: Admit for further  evaluation, telemetry   Elbert, Dedra Skeens, MD  [facility] Pager [phone]  Time spent in minutes: 75 minutes  Review of Systems:  Constitutional: Negative for fever, chills and malaise/fatigue. Negative for diaphoresis.  HENT:  Negative for hearing loss, ear pain, nosebleeds, congestion, sore throat, neck pain, tinnitus and ear discharge.   Eyes: Negative for blurred vision, double vision, photophobia, pain, discharge and redness.  Respiratory: per HPI.   Cardiovascular: Negative for chest pain, palpitations, orthopnea, claudication and leg swelling.  Gastrointestinal: Negative for nausea, vomiting and abdominal pain. Negative for heartburn, constipation, blood in stool and melena.  Genitourinary: Negative for dysuria, urgency, frequency, hematuria and flank pain.  Musculoskeletal: Negative for myalgias, back pain, joint pain and falls.  Skin: Negative for itching and rash.  Neurological: Negative for dizziness and weakness. Negative for tingling, tremors, sensory change, speech change, focal weakness, loss of consciousness and headaches.  Endo/Heme/Allergies: Negative for environmental allergies and polydipsia. Does not bruise/bleed easily.  Psychiatric/Behavioral: Negative for suicidal ideas. The patient is not nervous/anxious.      Past Medical History  Diagnosis Date  . COPD (chronic obstructive pulmonary disease)   . Osteoporosis   . Cancer     colon  . Cancer of cervix     cervix  . Gout   . Gallstones   . Hard of hearing    Past Surgical History  Procedure Laterality Date  . Abdominal hysterectomy    . Orthopedic surgery    . Colon surgery    . Gallstone surgery      stones removed but still has gallbladder   Social History:  reports that she quit smoking about 20 years ago. Her smoking use included Cigarettes. She does not have any smokeless tobacco history on file. She reports that she does not drink alcohol or use illicit drugs.  No Known Allergies  Family History:  Family History  Problem Relation Age of Onset  . Hypertension Mother   . Heart disease Mother   . Diabetes Sister   . Diabetes Sister   . Diabetes Sister   . Diabetes Sister      Prior to Admission medications    Medication Sig Start Date End Date Taking? Authorizing Provider  albuterol (PROVENTIL HFA;VENTOLIN HFA) 108 (90 BASE) MCG/ACT inhaler Inhale 1 puff into the lungs every 6 (six) hours as needed for wheezing or shortness of breath.   Yes Historical Provider, MD  albuterol (PROVENTIL) (2.5 MG/3ML) 0.083% nebulizer solution Take 2.5 mg by nebulization every 6 (six) hours as needed for wheezing or shortness of breath.   Yes Historical Provider, MD  amLODipine (NORVASC) 5 MG tablet Take 5 mg by mouth daily.   Yes Historical Provider, MD  aspirin EC 81 MG tablet Take 81 mg by mouth daily.   Yes Historical Provider, MD  Cholecalciferol (VITAMIN D) 2000 UNITS CAPS Take 2,000 Units by mouth daily.   Yes Historical Provider, MD  fish oil-omega-3 fatty acids 1000 MG capsule Take 2 g by mouth 2 (two) times daily.   Yes Historical Provider, MD  Ipratropium-Albuterol (COMBIVENT RESPIMAT) 20-100 MCG/ACT AERS respimat Inhale 1 puff into the lungs every 6 (six) hours as needed for wheezing.    Yes Historical Provider, MD  oxyCODONE-acetaminophen (PERCOCET/ROXICET) 5-325 MG per tablet Take 0.25 tablets by mouth every 4 (four) hours as needed for severe pain.   Yes Historical Provider, MD  rosuvastatin (CRESTOR) 20 MG tablet Take 20 mg by mouth at bedtime.   Yes Historical Provider, MD  levofloxacin (LEVAQUIN) 500 MG tablet Take 1 tablet (500 mg total) by mouth daily. Patient not taking: Reported on 03/22/2015 08/14/14   Nishant Dhungel, MD  predniSONE (DELTASONE) 20 MG tablet Take 1 tablet (20 mg total) by mouth daily with breakfast. Patient not taking: Reported on 03/22/2015 08/13/14   Louellen Molder, MD   Physical Exam: Filed Vitals:   03/22/15 1421 03/22/15 1441 03/22/15 1530 03/22/15 1535  BP:   133/53 130/55  Pulse:   81 82  Temp: 98.5 F (36.9 C)     TempSrc: Rectal     Resp:    20  SpO2:  91% 98% 98%    Physical Exam  Constitutional: Appears well-developed and well-nourished. No distress.  HENT:  Normocephalic. No tonsillar erythema or exudates Eyes: Conjunctivae and EOM are normal. PERRLA, no scleral icterus.  Neck: Normal ROM. Neck supple. No JVD. No tracheal deviation. No thyromegaly.  CVS: RRR, S1/S2 +, no murmurs, no gallops, no carotid bruit.  Pulmonary: diminished breawth sounds on right side with mild wheezing in upper lung lobes.  Abdominal: Soft. BS +,  no distension, tenderness, rebound or guarding.  Musculoskeletal: Normal range of motion. No edema and no tenderness.  Lymphadenopathy: No lymphadenopathy noted, cervical, inguinal. Neuro: Alert. Normal reflexes, muscle tone coordination. No focal neurologic deficits. Skin: Skin is warm and dry. No rash noted.  No erythema. No pallor.  Psychiatric: Normal mood and affect. Behavior, judgment, thought content normal.   Labs on Admission:  Basic Metabolic Panel:  Recent Labs Lab 03/22/15 1349  NA 143  K 4.6  CL 97*  CO2 38*  GLUCOSE 172*  BUN 27*  CREATININE 0.81  CALCIUM 9.1   Liver Function Tests: No results for input(s): AST, ALT, ALKPHOS, BILITOT, PROT, ALBUMIN in the last 168 hours. No results for input(s): LIPASE, AMYLASE in the last 168 hours. No results for input(s): AMMONIA in the last 168 hours. CBC:  Recent Labs Lab 03/22/15 1349  WBC 7.3  NEUTROABS 5.3  HGB 11.8*  HCT 40.2  MCV 87.8  PLT 191   Cardiac Enzymes:  Recent Labs Lab 03/22/15 1349  TROPONINI <0.03   BNP: Invalid input(s): POCBNP CBG: No results for input(s): GLUCAP in the last 168 hours.  If 7PM-7AM, please contact night-coverage [email or web address] Password [email or web address] 03/22/2015, 4:05 PM

## 2015-03-22 NOTE — ED Notes (Signed)
Per nurse she is going to start a IV

## 2015-03-22 NOTE — ED Provider Notes (Signed)
CSN: [id number]     Arrival date & time 03/22/15  1223 History   First MD Initiated Contact with Patient 03/22/15 1310     Chief Complaint  Patient presents with  . Shortness of Breath  . exertional dyspnea      (Consider location/radiation/quality/duration/timing/severity/associated sxs/prior Treatment) HPI Comments: 81 year old female with COPD, on home oxygen 3.5 L, pneumonia, diabetes, chronic respiratory failure, high blood pressure presents with worsening shortness of breath with exertion for the past 2 weeks. Similar to previous COPD however getting worse requiring more oxygen. Initially patient used oxygen at certain times during the day now using continuous and needing more. Cough is resolved. Patient finished anti-biotic called in by her primary doctor that is not from [place]. No leg swelling, blood clot history, cardiac history, active cancer, weight gain. Past smoker  Patient is a 81 y.o. female presenting with shortness of breath. The history is provided by the patient.  Shortness of Breath Associated symptoms: no abdominal pain, no chest pain, no fever, no headaches, no neck pain, no rash and no vomiting     Past Medical History  Diagnosis Date  . COPD (chronic obstructive pulmonary disease)   . Osteoporosis   . Cancer     colon  . Cancer of cervix     cervix  . Gout   . Gallstones   . Hard of hearing    Past Surgical History  Procedure Laterality Date  . Abdominal hysterectomy    . Orthopedic surgery    . Colon surgery    . Gallstone surgery      stones removed but still has gallbladder   Family History  Problem Relation Age of Onset  . Hypertension Mother   . Heart disease Mother   . Diabetes Sister   . Diabetes Sister   . Diabetes Sister   . Diabetes Sister    History  Substance Use Topics  . Smoking status: Former Smoker    Types: Cigarettes    Quit date: 08/11/1994  . Smokeless tobacco: Not on file  . Alcohol Use: No   OB History    No  data available     Review of Systems  Constitutional: Negative for fever and chills.  HENT: Negative for congestion.   Eyes: Negative for visual disturbance.  Respiratory: Positive for shortness of breath.   Cardiovascular: Negative for chest pain.  Gastrointestinal: Negative for vomiting and abdominal pain.  Genitourinary: Negative for dysuria and flank pain.  Musculoskeletal: Negative for back pain, neck pain and neck stiffness.  Skin: Negative for rash.  Neurological: Negative for light-headedness and headaches.      Allergies  Review of patient's allergies indicates no known allergies.  Home Medications   Prior to Admission medications   Medication Sig Start Date End Date Taking? Authorizing Provider  albuterol (PROVENTIL HFA;VENTOLIN HFA) 108 (90 BASE) MCG/ACT inhaler Inhale 1 puff into the lungs every 6 (six) hours as needed for wheezing or shortness of breath.   Yes Historical Provider, MD  albuterol (PROVENTIL) (2.5 MG/3ML) 0.083% nebulizer solution Take 2.5 mg by nebulization every 6 (six) hours as needed for wheezing or shortness of breath.   Yes Historical Provider, MD  amLODipine (NORVASC) 5 MG tablet Take 5 mg by mouth daily.   Yes Historical Provider, MD  aspirin EC 81 MG tablet Take 81 mg by mouth daily.   Yes Historical Provider, MD  Cholecalciferol (VITAMIN D) 2000 UNITS CAPS Take 2,000 Units by mouth daily.   Yes Historical  Provider, MD  fish oil-omega-3 fatty acids 1000 MG capsule Take 2 g by mouth 2 (two) times daily.   Yes Historical Provider, MD  Ipratropium-Albuterol (COMBIVENT RESPIMAT) 20-100 MCG/ACT AERS respimat Inhale 1 puff into the lungs every 6 (six) hours as needed for wheezing.    Yes Historical Provider, MD  oxyCODONE-acetaminophen (PERCOCET/ROXICET) 5-325 MG per tablet Take 0.25 tablets by mouth every 4 (four) hours as needed for severe pain.   Yes Historical Provider, MD  PRESCRIPTION MEDICATION every 30 (thirty) days. Shot in eye every month at  Dr. Zigmund Daniel office   Yes Historical Provider, MD  rosuvastatin (CRESTOR) 20 MG tablet Take 20 mg by mouth at bedtime.   Yes Historical Provider, MD  Tetrahydrozoline HCl (EYE DROPS OP) Place 4 drops into the left eye over 48 hr. Uses after she get shot in eye for 2 days   Yes Historical Provider, MD  guaiFENesin (MUCINEX) 600 MG 12 hr tablet Take 2 tablets (1,200 mg total) by mouth 2 (two) times daily. Patient not taking: Reported on 03/22/2015 08/13/14   Nishant Dhungel, MD  levofloxacin (LEVAQUIN) 500 MG tablet Take 1 tablet (500 mg total) by mouth daily. Patient not taking: Reported on 03/22/2015 08/14/14   Nishant Dhungel, MD  predniSONE (DELTASONE) 20 MG tablet Take 1 tablet (20 mg total) by mouth daily with breakfast. Patient not taking: Reported on 03/22/2015 08/13/14   Nishant Dhungel, MD   BP 130/55 mmHg  Pulse 82  Temp(Src) 98.5 F (36.9 C) (Rectal)  Resp 20  SpO2 98% Physical Exam  Constitutional: She is oriented to person, place, and time. She appears well-developed and well-nourished.  HENT:  Head: Normocephalic and atraumatic.  Eyes: Right eye exhibits no discharge. Left eye exhibits no discharge.  Neck: Normal range of motion. Neck supple. No tracheal deviation present.  Cardiovascular: Normal rate and regular rhythm.   Pulmonary/Chest: Effort normal. She has wheezes (exp bilateral). Rales: decr breath sounds right base.  Abdominal: Soft. She exhibits no distension. There is no tenderness. There is no guarding.  Musculoskeletal: She exhibits no edema.  Neurological: She is alert and oriented to person, place, and time.  Skin: Skin is warm. No rash noted.  Psychiatric: She has a normal mood and affect.  Nursing note and vitals reviewed.   ED Course  Procedures (including critical care time) Labs Review Labs Reviewed  BASIC METABOLIC PANEL - Abnormal; Notable for the following:    Chloride 97 (*)    CO2 38 (*)    Glucose, Bld 172 (*)    BUN 27 (*)    All other  components within normal limits  CBC WITH DIFFERENTIAL/PLATELET - Abnormal; Notable for the following:    Hemoglobin 11.8 (*)    MCH 25.8 (*)    MCHC 29.4 (*)    All other components within normal limits  TROPONIN I    Imaging Review Dg Chest 2 View  03/22/2015   CLINICAL DATA:  Shortness of breath for 1 month. On home oxygen. History of emphysema. Initial encounter.  EXAM: CHEST  2 VIEW  COMPARISON:  Radiographs 08/11/2014.  FINDINGS: New moderate size right pleural effusion may be partially loculated inferolaterally. There is associated right basilar pulmonary opacity, likely atelectasis. The left lung is clear. The lungs are hyperinflated with chronic central airway thickening. The visualized heart size and mediastinal contours are stable. Thoracic spine degenerative changes are stable.  IMPRESSION: New moderate size right pleural effusion, potentially partially loculated. If the etiology for this is not  known clinically, thoracentesis should be considered.   Electronically Signed   By: Richardean Sale M.D.   On: 03/22/2015 14:45     EKG Interpretation None     ekg reviewed hr 66, nsr, no acute st changes, nl qt MDM   Final diagnoses:  Acute exacerbation of chronic obstructive pulmonary disease (COPD)  Pleural effusion, right  Dyspnea   Patient presents with worsening exertional shortness breath no cardiac history known. Clinically concern for acute COPD exacerbation. Patient requiring 4 L nasal cannula drops into the 80s with any significant exertion. Plan for steroids, nebcontinuously and admission to telemetry. No classic blood clot risk factors.   Chest x-ray reviewed showing new pleural effusion. With increase option requirement plan for admission. [facility]. The patients results and plan were reviewed and discussed.   Any x-rays performed were personally reviewed by myself.   Differential diagnosis were considered with the presenting HPI.  Medications   methylPREDNISolone sodium succinate (SOLU-MEDROL) 125 mg/2 mL injection 125 mg (125 mg Intravenous Given 03/22/15 1515)  albuterol (PROVENTIL,VENTOLIN) solution continuous neb (10 mg/hr Nebulization Given 03/22/15 1440)    Filed Vitals:   03/22/15 1227 03/22/15 1421 03/22/15 1441 03/22/15 1535  BP: 120/64   130/55  Pulse: 85   82  Temp: 99.7 F (37.6 C) 98.5 F (36.9 C)    TempSrc: Oral Rectal    Resp: 17   20  SpO2: 91%  91% 98%    Final diagnoses:  Acute exacerbation of chronic obstructive pulmonary disease (COPD)  Pleural effusion, right  Dyspnea    Admission/ observation were discussed with the admitting physician, patient and/or family and they are comfortable with the plan.     Elnora Sampsel, MD 03/22/15 [phone]

## 2015-03-23 ENCOUNTER — Inpatient Hospital Stay (HOSPITAL_COMMUNITY): Payer: Medicare Other

## 2015-03-23 DIAGNOSIS — J9601 Acute respiratory failure with hypoxia: Secondary | ICD-10-CM

## 2015-03-23 LAB — BODY FLUID CELL COUNT WITH DIFFERENTIAL
EOS FL: 14 %
LYMPHS FL: 27 %
Monocyte-Macrophage-Serous Fluid: 43 % — ABNORMAL LOW (ref 50–90)
Neutrophil Count, Fluid: 16 % (ref 0–25)
Total Nucleated Cell Count, Fluid: 2232 cu mm — ABNORMAL HIGH (ref 0–1000)

## 2015-03-23 LAB — BASIC METABOLIC PANEL
ANION GAP: 13 (ref 5–15)
BUN: 29 mg/dL — AB (ref 6–20)
CHLORIDE: 94 mmol/L — AB (ref 101–111)
CO2: 35 mmol/L — ABNORMAL HIGH (ref 22–32)
Calcium: 9.4 mg/dL (ref 8.9–10.3)
Creatinine, Ser: 1 mg/dL (ref 0.44–1.00)
GFR calc Af Amer: 59 mL/min — ABNORMAL LOW (ref >60)
GFR calc non Af Amer: 51 mL/min — ABNORMAL LOW (ref >60)
Glucose, Bld: 264 mg/dL — ABNORMAL HIGH (ref 65–99)
Potassium: 5.1 mmol/L (ref 3.5–5.1)
Sodium: 142 mmol/L (ref 135–145)

## 2015-03-23 LAB — HEPATIC FUNCTION PANEL
ALBUMIN: 3.5 g/dL (ref 3.5–5.0)
ALK PHOS: 69 U/L (ref 38–126)
ALT: 11 U/L — ABNORMAL LOW (ref 14–54)
AST: 17 U/L (ref 15–41)
Bilirubin, Direct: <0.1 mg/dL — ABNORMAL LOW (ref 0.1–0.5)
Total Bilirubin: 0.1 mg/dL — ABNORMAL LOW (ref 0.3–1.2)
Total Protein: 6.8 g/dL (ref 6.5–8.1)

## 2015-03-23 LAB — GLUCOSE, CAPILLARY: Glucose-Capillary: 245 mg/dL — ABNORMAL HIGH (ref 65–99)

## 2015-03-23 LAB — CBC
HCT: 38.8 % (ref 36.0–46.0)
Hemoglobin: 11.4 g/dL — ABNORMAL LOW (ref 12.0–15.0)
MCH: 24.9 pg — ABNORMAL LOW (ref 26.0–34.0)
MCHC: 29.4 g/dL — AB (ref 30.0–36.0)
MCV: 84.7 fL (ref 78.0–100.0)
Platelets: 187 10*3/uL (ref 150–400)
RBC: 4.58 MIL/uL (ref 3.87–5.11)
RDW: 14.4 % (ref 11.5–15.5)
WBC: 6.8 10*3/uL (ref 4.0–10.5)

## 2015-03-23 LAB — LEGIONELLA ANTIGEN, URINE

## 2015-03-23 LAB — LACTATE DEHYDROGENASE: LDH: 109 U/L (ref 98–192)

## 2015-03-23 LAB — LACTATE DEHYDROGENASE, PLEURAL OR PERITONEAL FLUID: LD, Fluid: 185 U/L — ABNORMAL HIGH (ref 3–23)

## 2015-03-23 LAB — PH, BODY FLUID: pH, Fluid: 8

## 2015-03-23 MED ORDER — IPRATROPIUM-ALBUTEROL 0.5-2.5 (3) MG/3ML IN SOLN: 3.0000 mL | Freq: Three times a day (TID) | RESPIRATORY_TRACT | Status: DC

## 2015-03-23 MED ORDER — PREDNISONE 50 MG PO TABS
50.0000 mg | ORAL_TABLET | Freq: Every day | ORAL | Status: DC
  Filled 2015-03-23: qty 1

## 2015-03-23 MED ORDER — NEBIVOLOL HCL 2.5 MG PO TABS
2.5000 mg | ORAL_TABLET | Freq: Every day | ORAL | Status: DC
  Administered 2015-03-23: 2.5 mg via ORAL
  Filled 2015-03-23: qty 1

## 2015-03-23 MED ORDER — AZITHROMYCIN 500 MG PO TABS: 500.0000 mg | ORAL_TABLET | Freq: Every day | ORAL | Status: DC

## 2015-03-23 MED ORDER — PREDNISONE 5 MG PO TABS: ORAL_TABLET | ORAL | Status: DC

## 2015-03-23 NOTE — Discharge Instructions (Signed)
Follow with Primary MD and your Lung Doctor  in 7 days   Get CBC, CMP, 2 view Chest X ray checked  by Primary MD next visit.    Activity: As tolerated with Full fall precautions use walker/cane & assistance as needed   Disposition Home    Diet: Heart Healthy    For Heart failure patients - Check your Weight same time everyday, if you gain over 2 pounds, or you develop in leg swelling, experience more shortness of breath or chest pain, call your Primary MD immediately. Follow Cardiac Low Salt Diet and 1.5 lit/day fluid restriction.   On your next visit with your primary care physician please Get Medicines reviewed and adjusted.   Please request your Prim.MD to go over all Hospital Tests and Procedure/Radiological results at the follow up, please get all Hospital records sent to your Prim MD by signing hospital release before you go home.   If you experience worsening of your admission symptoms, develop shortness of breath, life threatening emergency, suicidal or homicidal thoughts you must seek medical attention immediately by calling 911 or calling your MD immediately  if symptoms less severe.  You Must read complete instructions/literature along with all the possible adverse reactions/side effects for all the Medicines you take and that have been prescribed to you. Take any new Medicines after you have completely understood and accpet all the possible adverse reactions/side effects.   Do not drive, operating heavy machinery, perform activities at heights, swimming or participation in water activities or provide baby sitting services if your were admitted for syncope or siezures until you have seen by Primary MD or a Neurologist and advised to do so again.  Do not drive when taking Pain medications.    Do not take more than prescribed Pain, Sleep and Anxiety Medications  Special Instructions: If you have smoked or chewed Tobacco  in the last 2 yrs please stop smoking, stop any  regular Alcohol  and or any Recreational drug use.  Wear Seat belts while driving.   Please note  You were cared for by a hospitalist during your hospital stay. If you have any questions about your discharge medications or the care you received while you were in the hospital after you are discharged, you can call the unit and asked to speak with the hospitalist on call if the hospitalist that took care of you is not available. Once you are discharged, your primary care physician will handle any further medical issues. Please note that NO REFILLS for any discharge medications will be authorized once you are discharged, as it is imperative that you return to your primary care physician (or establish a relationship with a primary care physician if you do not have one) for your aftercare needs so that they can reassess your need for medications and monitor your lab values.

## 2015-03-23 NOTE — Discharge Summary (Signed)
Emily Owen, is a 81 y.o. female  DOB 1930-01-13  MRN [id number].  Admission date:  03/22/2015  Admitting Physician  Robbie Lis, MD  Discharge Date:  03/23/2015   Primary MD  PROVIDER NOT IN SYSTEM  Recommendations for primary care physician for things to follow:   Check CBC, CMO, CXR in 7 days, follow Pleural Fluid results   Admission Diagnosis  Dyspnea [R06.00] Acute exacerbation of chronic obstructive pulmonary disease (COPD) [J44.1] Pleural effusion, right [J94.8]   Discharge Diagnosis  Dyspnea [R06.00] Acute exacerbation of chronic obstructive pulmonary disease (COPD) [J44.1] Pleural effusion, right [J94.8]     Principal Problem:   Acute respiratory failure with hypoxia Active Problems:   CAP (community acquired pneumonia)   Hyperlipidemia   COPD with acute exacerbation   HTN (hypertension)   Acute exacerbation of chronic obstructive pulmonary disease (COPD)      Past Medical History  Diagnosis Date  . COPD (chronic obstructive pulmonary disease)   . Osteoporosis   . Cancer     colon  . Cancer of cervix     cervix  . Gout   . Gallstones   . Hard of hearing     Past Surgical History  Procedure Laterality Date  . Abdominal hysterectomy    . Orthopedic surgery    . Colon surgery    . Gallstone surgery      stones removed but still has gallbladder       History of present illness and  Hospital Course:     Kindly see H&P for history of present illness and admission details, please review complete Labs, Consult reports and Test reports for all details in brief  HPI  from the history and physical done on the day of admission  81 year old female with past medical history of hypertension, dyslipidemia, COPD, has recently moved to this area from [place] and has not yet had a  chance to establish a PCP here. She presented to [facility] ED with worsening shortness of breath, intermittent productive cough with yellowish sputum over past 1-2 days prior to this admission. Patient reports using nebulizer treatment as prescribed but it has not improved symptoms significantly. Shortness of breath was present at rest and with exertion. She does not recall having a fever or chills. No chest pain. No abdominal pain, nausea or vomiting. No reports of diarrhea. No blood in stool urine. No urinary complaints. No lightheadedness or falls. No recent exposure to sick contacts.   In ED, pt was hemodynamically stable. She was given nebulizer treatments, solumedrol 125 mg IV once but still oxygen saturation in upper 80's to low 90's with 4 L [place] oxygen support. CXR showed right pleural effusion. Pt admitted for further evaluation of right pleural effusion and management of COPD exacerbation.   Hospital Course    1. Acute on chronic hypoxic respiratory failure due to acute on chronic COPD exacerbation + CAP. She uses 3 L nasal cannula oxygen at home, much improved after IV Steroids and ABX, continue nebulizer  treatments and oxygen supplementation. We will switch to oral prednisone and 5 more days of oral azithromycin, she is symptom free and will be discharged home with close outpatient pulmonary follow-up. Appointment has been set for pulmonary follow-up 2 days from now.   2. Loculated right-sided pleural effusion. Ultrasound-guided thoracentesis was done by IR today, have ordered appropriate testing including cytology. Prelim looks to be an exudate ? Para pneumonic, she is symptom free and eager to be discharged will follow with pulmonary in 2 days.   3. Essential hypertension.   Continue Norvasc at low-dose.   4. Dyslipidemia. Continue Crestor.    Discharge Condition:    Follow UP  Follow-up Information    Follow up with Simonne Maffucci, MD. Schedule an appointment as soon as possible  for a visit on 03/24/2016.   Specialty:  Pulmonary Disease   Why:  10.00am   Contact information:   [address] [place] [address] [phone]       Follow up with [facility]    . Schedule an appointment as soon as possible for a visit in 1 week.   Contact information:   [address] [place] [address] [phone]       Consults obtained - None  Diet and Activity recommendation: See Discharge Instructions below  Discharge Instructions           Discharge Instructions    Body fluid culture with gram stain    Complete by:  Mar 23, 2015      Diet - low sodium heart healthy    Complete by:  As directed      Discharge instructions    Complete by:  As directed   Follow with Primary MD in 7 days   Get CBC, CMP, 2 view Chest X ray checked  by Primary MD next visit.    Activity: As tolerated with Full fall precautions use walker/cane & assistance as needed   Disposition Home    Diet: Heart Healthy .  For Heart failure patients - Check your Weight same time everyday, if you gain over 2 pounds, or you develop in leg swelling, experience more shortness of breath or chest pain, call your Primary MD immediately. Follow Cardiac Low Salt Diet and 1.5 lit/day fluid restriction.   On your next visit with your primary care physician please Get Medicines reviewed and adjusted.   Please request your Prim.MD to go over all Hospital Tests and Procedure/Radiological results at the follow up, please get all Hospital records sent to your Prim MD by signing hospital release before you go home.   If you experience worsening of your admission symptoms, develop shortness of breath, life threatening emergency, suicidal or homicidal thoughts you must seek medical attention immediately by calling 911 or calling your MD immediately  if symptoms less severe.  You Must read complete instructions/literature along with all the possible adverse  reactions/side effects for all the Medicines you take and that have been prescribed to you. Take any new Medicines after you have completely understood and accpet all the possible adverse reactions/side effects.   Do not drive, operating heavy machinery, perform activities at heights, swimming or participation in water activities or provide baby sitting services if your were admitted for syncope or siezures until you have seen by Primary MD or a Neurologist and advised to do so again.  Do not drive when taking Pain medications.    Do not take more than prescribed Pain,  Sleep and Anxiety Medications  Special Instructions: If you have smoked or chewed Tobacco  in the last 2 yrs please stop smoking, stop any regular Alcohol  and or any Recreational drug use.  Wear Seat belts while driving.   Please note  You were cared for by a hospitalist during your hospital stay. If you have any questions about your discharge medications or the care you received while you were in the hospital after you are discharged, you can call the unit and asked to speak with the hospitalist on call if the hospitalist that took care of you is not available. Once you are discharged, your primary care physician will handle any further medical issues. Please note that NO REFILLS for any discharge medications will be authorized once you are discharged, as it is imperative that you return to your primary care physician (or establish a relationship with a primary care physician if you do not have one) for your aftercare needs so that they can reassess your need for medications and monitor your lab values.     Increase activity slowly    Complete by:  As directed            Discharge Medications     Medication List    TAKE these medications        albuterol 108 (90 BASE) MCG/ACT inhaler  Commonly known as:  PROVENTIL HFA;VENTOLIN HFA  Inhale 1 puff into the lungs every 6 (six) hours as needed for wheezing or shortness  of breath.     albuterol (2.5 MG/3ML) 0.083% nebulizer solution  Commonly known as:  PROVENTIL  Take 2.5 mg by nebulization every 6 (six) hours as needed for wheezing or shortness of breath.     amLODipine 5 MG tablet  Commonly known as:  NORVASC  Take 5 mg by mouth daily.     aspirin EC 81 MG tablet  Take 81 mg by mouth daily.     azithromycin 500 MG tablet  Commonly known as:  ZITHROMAX  Take 1 tablet (500 mg total) by mouth daily.     COMBIVENT RESPIMAT 20-100 MCG/ACT Aers respimat  Generic drug:  Ipratropium-Albuterol  Inhale 1 puff into the lungs every 6 (six) hours as needed for wheezing.     EYE DROPS OP  Place 4 drops into the left eye over 48 hr. Uses after she get shot in eye for 2 days     fish oil-omega-3 fatty acids 1000 MG capsule  Take 2 g by mouth 2 (two) times daily.     guaiFENesin 600 MG 12 hr tablet  Commonly known as:  MUCINEX  Take 2 tablets (1,200 mg total) by mouth 2 (two) times daily.     levofloxacin 500 MG tablet  Commonly known as:  LEVAQUIN  Take 1 tablet (500 mg total) by mouth daily.     oxyCODONE-acetaminophen 5-325 MG per tablet  Commonly known as:  PERCOCET/ROXICET  Take 0.25 tablets by mouth every 4 (four) hours as needed for severe pain.     predniSONE 5 MG tablet  Commonly known as:  DELTASONE  Label  & dispense according to the schedule below. 10 Pills PO for 3 days then, 8 Pills PO for 3 days, 6 Pills PO for 3 days, 4 Pills PO for 3 days, 2 Pills PO for 3 days, 1 Pills PO for 3 days, 1/2 Pill  PO for 3 days then STOP.     PRESCRIPTION MEDICATION  every 30 (thirty) days. Shot  in eye every month at Dr. Zigmund Daniel office     rosuvastatin 20 MG tablet  Commonly known as:  CRESTOR  Take 20 mg by mouth at bedtime.     Vitamin D 2000 UNITS Caps  Take 2,000 Units by mouth daily.        Major procedures and Radiology Reports - PLEASE review detailed and final reports for all details, in brief -       Dg Chest 1 View  03/23/2015    CLINICAL DATA:  81 year old female with shortness of breath for 3 days. Pleural effusion on the right status post thoracentesis today. Initial encounter.  EXAM: CHEST  1 VIEW  COMPARISON:  03/22/2015 and earlier.  FINDINGS: PA view of the chest at 1244 hrs. Significantly decreased volume of right pleural fluid, small residual. No pneumothorax identified. Improved right lung base ventilation. Stable mediastinal contours. Stable left lung ventilation.  IMPRESSION: Significantly decreased volume of right pleural fluid after thoracentesis. No pneumothorax or new cardiopulmonary abnormality.   Electronically Signed   By: Genevie Ann M.D.   On: 03/23/2015 12:43   Dg Chest 2 View  03/22/2015   CLINICAL DATA:  Shortness of breath for 1 month. On home oxygen. History of emphysema. Initial encounter.  EXAM: CHEST  2 VIEW  COMPARISON:  Radiographs 08/11/2014.  FINDINGS: New moderate size right pleural effusion may be partially loculated inferolaterally. There is associated right basilar pulmonary opacity, likely atelectasis. The left lung is clear. The lungs are hyperinflated with chronic central airway thickening. The visualized heart size and mediastinal contours are stable. Thoracic spine degenerative changes are stable.  IMPRESSION: New moderate size right pleural effusion, potentially partially loculated. If the etiology for this is not known clinically, thoracentesis should be considered.   Electronically Signed   By: Richardean Sale M.D.   On: 03/22/2015 14:45   US Thoracentesis Asp Pleural Space W/img Guide  03/23/2015   CLINICAL DATA:  Pleural Effusion  EXAM: ULTRASOUND GUIDED Right THORACENTESIS  COMPARISON:  None.  PROCEDURE: An ultrasound guided thoracentesis was thoroughly discussed with the patient and questions answered. The benefits, risks, alternatives and complications were also discussed. The patient understands and wishes to proceed with the procedure. Written consent was obtained.  Ultrasound was  performed to localize and mark an adequate pocket of fluid in the Right chest. The area was then prepped and draped in the normal sterile fashion. 1% Lidocaine was used for local anesthesia. Under ultrasound guidance a 19 gauge Safe T Centesis catheter was introduced. Thoracentesis was performed. The catheter was removed and a dressing applied.  COMPLICATIONS: None.  FINDINGS: A total of approximately 1.2 of Clear yellow fluid was removed. A fluid sample wassent for laboratory analysis.  IMPRESSION: Successful ultrasound guided right thoracentesis yielding 1.2 of pleural fluid.  Read By:  Gareth Eagle, PA-C   Electronically Signed   By: Jerilynn Mages.  Shick M.D.   On: 03/23/2015 13:43    Micro Results      Recent Results (from the past 240 hour(s))  Culture, blood (routine x 2) Call MD if unable to obtain prior to antibiotics being given     Status: None (Preliminary result)   Collection Time: 03/22/15  6:10 PM  Result Value Ref Range Status   Specimen Description BLOOD RIGHT ARM  Final   Special Requests BOTTLES DRAWN AEROBIC AND ANAEROBIC  10ML  Final   Culture   Final           BLOOD CULTURE RECEIVED NO GROWTH  TO DATE CULTURE WILL BE HELD FOR 5 DAYS BEFORE ISSUING A FINAL NEGATIVE REPORT Performed at [facility]    Report Status PENDING  Incomplete  Culture, blood (routine x 2) Call MD if unable to obtain prior to antibiotics being given     Status: None (Preliminary result)   Collection Time: 03/22/15  6:16 PM  Result Value Ref Range Status   Specimen Description BLOOD RIGHT ARM  Final   Special Requests BOTTLES DRAWN AEROBIC AND ANAEROBIC  10ML  Final   Culture   Final           BLOOD CULTURE RECEIVED NO GROWTH TO DATE CULTURE WILL BE HELD FOR 5 DAYS BEFORE ISSUING A FINAL NEGATIVE REPORT Performed at [facility]    Report Status PENDING  Incomplete       Today   Subjective:   Emily Owen today has no headache,no chest abdominal pain,no new weakness tingling or  numbness, feels much better wants to go home today.    Objective:   Blood pressure 122/65, pulse 87, temperature 98.5 F (36.9 C), temperature source Oral, resp. rate 18, height 5\' 4"  (1.626 m), weight 82.101 kg (181 lb), SpO2 94 %.   Intake/Output Summary (Last 24 hours) at 03/23/15 1613 Last data filed at 03/22/15 1951  Gross per 24 hour  Intake      0 ml  Output    200 ml  Net   -200 ml    Exam Awake Alert, Oriented x 3, No new F.N deficits, Normal affect [place].AT,PERRAL Supple Neck,No JVD, No cervical lymphadenopathy appriciated.  Symmetrical Chest wall movement, Good air movement bilaterally, CTAB RRR,No Gallops,Rubs or new Murmurs, No Parasternal Heave +ve B.Sounds, Abd Soft, Non tender, No organomegaly appriciated, No rebound -guarding or rigidity. No Cyanosis, Clubbing or edema, No new Rash or bruise  Data Review   CBC w Diff:  Lab Results  Component Value Date   WBC 6.8 03/23/2015   HGB 11.4* 03/23/2015   HCT 38.8 03/23/2015   PLT 187 03/23/2015   LYMPHOPCT 7* 03/22/2015   MONOPCT 2* 03/22/2015   EOSPCT 1 03/22/2015   BASOPCT 0 03/22/2015    CMP:  Lab Results  Component Value Date   NA 142 03/23/2015   K 5.1 03/23/2015   CL 94* 03/23/2015   CO2 35* 03/23/2015   BUN 29* 03/23/2015   CREATININE 1.00 03/23/2015   PROT 7.1 03/22/2015   ALBUMIN 4.0 03/22/2015   BILITOT 0.4 03/22/2015   ALKPHOS 74 03/22/2015   AST 15 03/22/2015   ALT 11* 03/22/2015  .   Total Time in preparing paper work, data evaluation and todays exam - 35 minutes  Thurnell Lose M.D on 03/23/2015 at [facility]  [phone]

## 2015-03-23 NOTE — Procedures (Signed)
Successful US guided Right thoracentesis. Yielded 1.2 Liters of clear yellow fluid. Pt tolerated procedure well. No immediate complications.  Specimen was sent for labs. CXR ordered.  Gareth Eagle R PA-C 03/23/2015 1:41 PM

## 2015-03-23 NOTE — Care Management Note (Signed)
Case Management Note  Patient Details  Name: Emily Owen MRN: [id number] Date of Birth: 1929-12-07  Subjective/Objective:      81 yo female admitted with COPD exacerbation              Action/Plan:   Patient stated that she recently moved from [place] to [place] to live with her daughter for increased support. She stated that she has home O2 at 3L continuous. She also has a walker and a cane. She confirmed that she does not yet have a local PCP but her daughter is connecting her to Dr. Joya Gaskins or someone that he recommends so she does not need any assistance finding a PCP. Will continu to follow for any dc needs.  Expected Discharge Date:   (unknown)               Expected Discharge Plan:  Home/Self Care  In-House Referral:     Discharge planning Services  CM Consult  Post Acute Care Choice:  NA Choice offered to:  NA  DME Arranged:    DME Agency:     HH Arranged:    [place] Agency:     Status of Service:     Medicare Important Message Given:  N/A - LOS <3 / Initial given by admissions Date Medicare IM Given:    Medicare IM give by:    Date Additional Medicare IM Given:    Additional Medicare Important Message give by:     If discussed at [place] of Stay Meetings, dates discussed:    Additional Comments:  Scot Dock, RN 03/23/2015, 1:03 PM

## 2015-03-23 NOTE — Progress Notes (Signed)
Patient Demographics  Emily Owen, is a 81 y.o. female, DOB - 07-Dec-1929, DTO:[id number]  Admit date - 03/22/2015   Admitting Physician Robbie Lis, MD  Outpatient Primary MD for the patient is PROVIDER NOT [place]  LOS - 1   Chief Complaint  Patient presents with  . Shortness of Breath  . exertional dyspnea         Subjective:   Emily Owen today has, No headache, No chest pain, No abdominal pain - No Nausea, No new weakness tingling or numbness, No Cough - SOB.   Assessment & Plan    1. Acute on chronic hypoxic respiratory failure due to acute on chronic COPD exacerbation + CAP. She uses 3 L nasal cannula oxygen at home, much improved after IV Steroids and ABX, continue nebulizer treatments and oxygen supplementation. We will switch to oral prednisone and likely discharged tomorrow.   2. Loculated small right-sided pleural effusion. Ultrasound-guided thoracentesis to be done by IR today, have ordered appropriate testing including cytology. We will follow with pulmonary outpatient for further follow-up.   3. Essential hypertension. Added Bystolic for better control. Continue Norvasc at low-dose.   4. Dyslipidemia. Continue Crestor.     Code Status: Full  Family Communication: Daughter bedside  Disposition Plan: Home tomorrow   Consults  IR   Procedures   Ultrasound-guided right thoracentesis ordered by IR on 03/23/2015   DVT Prophylaxis    SCDs    Lab Results  Component Value Date   PLT 187 03/23/2015    Medications  Scheduled Meds: . amLODipine  5 mg Oral Daily  . aspirin EC  81 mg Oral Daily  . azithromycin  500 mg Intravenous Q24H  . cefTRIAXone (ROCEPHIN)  IV  1 g Intravenous Q24H  . chlorhexidine  15 mL Mouth/Throat BID  . cholecalciferol   2,000 Units Oral Daily  . ipratropium-albuterol  3 mL Nebulization Q4H  . omega-3 acid ethyl esters  2 g Oral Daily  . [START ON 03/24/2015] predniSONE  50 mg Oral Q breakfast  . rosuvastatin  20 mg Oral QHS  . sodium chloride  3 mL Intravenous Q12H   Continuous Infusions:  PRN Meds:.acetaminophen **OR** [DISCONTINUED] acetaminophen, ipratropium-albuterol, [DISCONTINUED] ondansetron **OR** ondansetron (ZOFRAN) IV, oxyCODONE-acetaminophen  Antibiotics     Anti-infectives    Start     Dose/Rate Route Frequency Ordered Stop   03/22/15 1800  azithromycin (ZITHROMAX) 500 mg in dextrose 5 % 250 mL IVPB     500 mg 250 mL/hr over 60 Minutes Intravenous Every 24 hours 03/22/15 1746     03/22/15 1800  cefTRIAXone (ROCEPHIN) 1 g in dextrose 5 % 50 mL IVPB - Premix     1 g 100 mL/hr over 30 Minutes Intravenous Every 24 hours 03/22/15 1746          Objective:   Filed Vitals:   03/22/15 2200 03/23/15 0500 03/23/15 0806 03/23/15 0952  BP: 127/67 116/67    Pulse: 102 111    Temp: 98.7 F (37.1 C) 98.7 F (37.1 C)    TempSrc: Oral Oral    Resp: 22 18    Height:      Weight:    82.101 kg (181 lb)  SpO2: 96% 97% 93%  Wt Readings from Last 3 Encounters:  03/23/15 82.101 kg (181 lb)  08/13/14 81.8 kg (180 lb 5.4 oz)  08/22/12 82.3 kg (181 lb 7 oz)     Intake/Output Summary (Last 24 hours) at 03/23/15 1105 Last data filed at 03/22/15 1951  Gross per 24 hour  Intake      0 ml  Output    200 ml  Net   -200 ml     Physical Exam  Awake Alert, Oriented X 3, No new F.N deficits, Normal affect [place].AT,PERRAL Supple Neck,No JVD, No cervical lymphadenopathy appriciated.  Symmetrical Chest wall movement, Good air movement bilaterally, few right basilar rales RRR,No Gallops,Rubs or new Murmurs, No Parasternal Heave +ve B.Sounds, Abd Soft, No tenderness, No organomegaly appriciated, No rebound - guarding or rigidity. No Cyanosis, Clubbing or edema, No new Rash or bruise     Data  Review   Micro Results Recent Results (from the past 240 hour(s))  Culture, blood (routine x 2) Call MD if unable to obtain prior to antibiotics being given     Status: None (Preliminary result)   Collection Time: 03/22/15  6:10 PM  Result Value Ref Range Status   Specimen Description BLOOD RIGHT ARM  Final   Special Requests BOTTLES DRAWN AEROBIC AND ANAEROBIC  10ML  Final   Culture   Final           BLOOD CULTURE RECEIVED NO GROWTH TO DATE CULTURE WILL BE HELD FOR 5 DAYS BEFORE ISSUING A FINAL NEGATIVE REPORT Performed at [facility]    Report Status PENDING  Incomplete  Culture, blood (routine x 2) Call MD if unable to obtain prior to antibiotics being given     Status: None (Preliminary result)   Collection Time: 03/22/15  6:16 PM  Result Value Ref Range Status   Specimen Description BLOOD RIGHT ARM  Final   Special Requests BOTTLES DRAWN AEROBIC AND ANAEROBIC  10ML  Final   Culture   Final           BLOOD CULTURE RECEIVED NO GROWTH TO DATE CULTURE WILL BE HELD FOR 5 DAYS BEFORE ISSUING A FINAL NEGATIVE REPORT Performed at [facility]    Report Status PENDING  Incomplete    Radiology Reports Dg Chest 2 View  03/22/2015   CLINICAL DATA:  Shortness of breath for 1 month. On home oxygen. History of emphysema. Initial encounter.  EXAM: CHEST  2 VIEW  COMPARISON:  Radiographs 08/11/2014.  FINDINGS: New moderate size right pleural effusion may be partially loculated inferolaterally. There is associated right basilar pulmonary opacity, likely atelectasis. The left lung is clear. The lungs are hyperinflated with chronic central airway thickening. The visualized heart size and mediastinal contours are stable. Thoracic spine degenerative changes are stable.  IMPRESSION: New moderate size right pleural effusion, potentially partially loculated. If the etiology for this is not known clinically, thoracentesis should be considered.   Electronically Signed   By: Richardean Sale  M.D.   On: 03/22/2015 14:45     CBC  Recent Labs Lab 03/22/15 1349 03/22/15 1819 03/23/15 0501  WBC 7.3 9.3 6.8  HGB 11.8* 11.7* 11.4*  HCT 40.2 40.0 38.8  PLT 191 205 187  MCV 87.8 87.9 84.7  MCH 25.8* 25.7* 24.9*  MCHC 29.4* 29.3* 29.4*  RDW 14.6 14.6 14.4  LYMPHSABS 1.1 0.7  --   MONOABS 0.5 0.2  --   EOSABS 0.4 0.1  --   BASOSABS 0.0 0.0  --  Chemistries   Recent Labs Lab 03/22/15 1349 03/22/15 1819 03/23/15 0501  NA 143 143 142  K 4.6 4.0 5.1  CL 97* 95* 94*  CO2 38* 36* 35*  GLUCOSE 172* 211* 264*  BUN 27* 26* 29*  CREATININE 0.81 0.75 1.00  CALCIUM 9.1 9.0 9.4  MG  --  1.7  --   AST  --  15  --   ALT  --  11*  --   ALKPHOS  --  74  --   BILITOT  --  0.4  --    ------------------------------------------------------------------------------------------------------------------ estimated creatinine clearance is 45 mL/min (by C-G formula based on Cr of 1). ------------------------------------------------------------------------------------------------------------------ No results for input(s): HGBA1C in the last 72 hours. ------------------------------------------------------------------------------------------------------------------ No results for input(s): CHOL, HDL, LDLCALC, TRIG, CHOLHDL, LDLDIRECT in the last 72 hours. ------------------------------------------------------------------------------------------------------------------  Recent Labs  03/22/15 1750  TSH 1.392   ------------------------------------------------------------------------------------------------------------------ No results for input(s): VITAMINB12, FOLATE, FERRITIN, TIBC, IRON, RETICCTPCT in the last 72 hours.  Coagulation profile  Recent Labs Lab 03/22/15 1819  INR 1.07    No results for input(s): DDIMER in the last 72 hours.  Cardiac Enzymes  Recent Labs Lab 03/22/15 1349  TROPONINI <0.03    ------------------------------------------------------------------------------------------------------------------ Invalid input(s): POCBNP   Time Spent in minutes   35   SINGH,PRASHANT K M.D on 03/23/2015 at 11:05 AM  Between 7am to 7pm - Pager - [phone]  After 7pm go to [email or web address] - password [facility]  [facility]   Office  [phone]

## 2015-03-25 ENCOUNTER — Ambulatory Visit (INDEPENDENT_AMBULATORY_CARE_PROVIDER_SITE_OTHER): Payer: Medicare Other | Admitting: Pulmonary Disease

## 2015-03-25 ENCOUNTER — Ambulatory Visit (HOSPITAL_COMMUNITY)
Admission: RE | Admit: 2015-03-25 | Discharge: 2015-03-25 | Disposition: A | Discharge status: Home / Self Care | Payer: Medicare Other | Source: Ambulatory Visit | Attending: Pulmonary Disease | Admitting: Pulmonary Disease

## 2015-03-25 ENCOUNTER — Telehealth: Payer: Self-pay | Admitting: Pulmonary Disease

## 2015-03-25 ENCOUNTER — Encounter: Payer: Self-pay | Admitting: Pulmonary Disease

## 2015-03-25 VITALS — BP 112/60 | HR 75 | Ht 64.5 in | Wt 177.0 lb

## 2015-03-25 DIAGNOSIS — R0602 Shortness of breath: Secondary | ICD-10-CM | POA: Insufficient documentation

## 2015-03-25 DIAGNOSIS — J449 Chronic obstructive pulmonary disease, unspecified: Secondary | ICD-10-CM | POA: Insufficient documentation

## 2015-03-25 DIAGNOSIS — J432 Centrilobular emphysema: Secondary | ICD-10-CM | POA: Diagnosis not present

## 2015-03-25 DIAGNOSIS — J90 Pleural effusion, not elsewhere classified: Secondary | ICD-10-CM | POA: Insufficient documentation

## 2015-03-25 NOTE — Telephone Encounter (Signed)
I tried to call the patient and her daughter to review the CT scan which shows that her effusion looked better and it looks to me like this was from pneumonia. They said her gallbladder had some stones in it so if she has abdominal pain or nausea she should see her PCP.  Had to leave a message.  Will cc triage so they can try to communicate this to her today.

## 2015-03-25 NOTE — Patient Instructions (Signed)
We will call you with the results of the CT scan in the pulmonary function testing Keep taking oxygen as you're doing Stop taking the Combivent for now, take Anoro 1 puff in the morning and matter high U feel Use albuterol only as needed, every 4-6 hours We will see you back in 2-3 weeks or sooner if needed

## 2015-03-25 NOTE — Assessment & Plan Note (Signed)
She had a pleural effusion which was exudative in etiology, cytology normal, no evidence of infection. The cells identified or widely variant, though there were some eosinophils noted. I've reviewed all of her chest imaging since 2011 and it does appear to me that there is been some subtle scarring and/or pleural process that an evolving over the last few years in the right lung. The chest x-ray from earlier this week clearly shows a large right-sided pleural effusion but she was still left with some scarring versus consolidation in the right lung after thoracentesis.  Considering the fact that she had a pneumonia type syndrome treated as an outpatient about one month ago the most likely etiology is that this is an uncomplicated pleural effusion. However, considering her smoking history and her history of asbestos exposure I feel strongly when he to get a CT scan to ensure there is nothing else going on.  Plan: CT chest today without contrast

## 2015-03-25 NOTE — Telephone Encounter (Signed)
lmtcb X1 for pt  

## 2015-03-25 NOTE — Assessment & Plan Note (Signed)
She carries a diagnosis of COPD but I do not have lung function test available to me. She smoked for nearly 45 pack years and quit in the 1990s. I do not doubt the diagnosis of COPD as she does have some wheezing on exam today. We need pulmonary function testing in order to quantify this. Her current treatment regimen is probably not adequate considering her burden of symptoms.  Plan: Stop Combivent Start Anoro  Full pulmonary function testing Instructed to use albuterol on an as-needed basis only

## 2015-03-25 NOTE — Progress Notes (Signed)
Subjective:    Patient ID: Emily Owen, female    DOB: [birth year], 81 y.o.   MRN: [id number]  HPI Chief Complaint  Patient presents with  . Advice Only    Pt recently seen in [facility] for pleural effusion, CAP, copd exacerbation.     This is a very pleasant 81 year old female who has a past medical history significant for COPD who was just discharged from the hospital 2 days ago after a new diagnosis of a right-sided pleural effusion. She tells me that she had a normal childhood without respiratory illnesses which he started smoking cigarettes at age 31. She smoked at least one pack of cigarettes daily 01/07/1994, for a total of about 45 pack years. She has been hospitalized multiple times in the last 4 years for pneumonia. As best I can tell from reviewing the chart it seems that the first was in 2013 at [facility].  I reviewed the records from her hospitalization this week and it shows that she had a right-sided pleural effusion drained by interventional radiology. She was initially admitted after complaining of shortness of breath which had been increasing. On further history she tells me that back in April she had cough, fatigue, shortness of breath and mucus production. She was treated with antibody by her primary care physician but unfortunately she did not have a chest x-ray at that time. She initially started to feel better but then about one week prior to her admission last week she started having increasing shortness of breath which is quite severe. She decreased her exercise routine. She was eventually admitted and was found to have a large right-sided pleural effusion. Since leaving the hospital she says she has been feeling better. She has been using albuterol about 3-4 times a day. She is no longer coughing. She does have some wheezing. At baseline she uses 3 L of oxygen continuously.  Past Medical History  Diagnosis Date  . COPD (chronic obstructive pulmonary disease)   .  Osteoporosis   . Cancer     colon  . Cancer of cervix     cervix  . Gout   . Gallstones   . Hard of hearing      Family History  Problem Relation Age of Onset  . Hypertension Mother   . Heart disease Mother   . Diabetes Sister   . Diabetes Sister   . Diabetes Sister   . Diabetes Sister      History   Social History  . Marital Status: Married    Spouse Name: N/A  . Number of Children: N/A  . Years of Education: N/A   Occupational History  . Not on file.   Social History Main Topics  . Smoking status: Former Smoker -- 2.00 packs/day for 35 years    Types: Cigarettes    Quit date: 08/11/1994  . Smokeless tobacco: Never Used  . Alcohol Use: No  . Drug Use: No  . Sexual Activity: Not on file   Other Topics Concern  . Not on file   Social History Narrative     No Known Allergies   Outpatient Prescriptions Prior to Visit  Medication Sig Dispense Refill  . albuterol (PROVENTIL) (2.5 MG/3ML) 0.083% nebulizer solution Take 2.5 mg by nebulization every 6 (six) hours as needed for wheezing or shortness of breath.    Marland Kitchen amLODipine (NORVASC) 5 MG tablet Take 5 mg by mouth daily.    Marland Kitchen aspirin EC 81 MG tablet Take 81 mg  by mouth daily.    Marland Kitchen azithromycin (ZITHROMAX) 500 MG tablet Take 1 tablet (500 mg total) by mouth daily. 5 tablet 0  . Cholecalciferol (VITAMIN D) 2000 UNITS CAPS Take 2,000 Units by mouth daily.    . fish oil-omega-3 fatty acids 1000 MG capsule Take 2 g by mouth 2 (two) times daily.    Marland Kitchen guaiFENesin (MUCINEX) 600 MG 12 hr tablet Take 2 tablets (1,200 mg total) by mouth 2 (two) times daily. (Patient taking differently: Take 1,200 mg by mouth 2 (two) times daily as needed. ) 10 tablet 0  . Ipratropium-Albuterol (COMBIVENT RESPIMAT) 20-100 MCG/ACT AERS respimat Inhale 1 puff into the lungs every 6 (six) hours as needed for wheezing.     Marland Kitchen oxyCODONE-acetaminophen (PERCOCET/ROXICET) 5-325 MG per tablet Take 0.25 tablets by mouth every 4 (four) hours as needed for  severe pain.    . predniSONE (DELTASONE) 5 MG tablet Label  & dispense according to the schedule below. 10 Pills PO for 3 days then, 8 Pills PO for 3 days, 6 Pills PO for 3 days, 4 Pills PO for 3 days, 2 Pills PO for 3 days, 1 Pills PO for 3 days, 1/2 Pill  PO for 3 days then STOP. 95 tablet 0  . PRESCRIPTION MEDICATION every 30 (thirty) days. Shot in eye every month at Dr. Zigmund Daniel office    . rosuvastatin (CRESTOR) 20 MG tablet Take 20 mg by mouth at bedtime.    . Tetrahydrozoline HCl (EYE DROPS OP) Place 4 drops into the left eye over 48 hr. Uses after she get shot in eye for 2 days    . albuterol (PROVENTIL HFA;VENTOLIN HFA) 108 (90 BASE) MCG/ACT inhaler Inhale 1 puff into the lungs every 6 (six) hours as needed for wheezing or shortness of breath.    . levofloxacin (LEVAQUIN) 500 MG tablet Take 1 tablet (500 mg total) by mouth daily. (Patient not taking: Reported on 03/25/2015) 3 tablet 0   No facility-administered medications prior to visit.      Past Medical History  Diagnosis Date  . COPD (chronic obstructive pulmonary disease)   . Osteoporosis   . Cancer     colon  . Cancer of cervix     cervix  . Gout   . Gallstones   . Hard of hearing      Family History  Problem Relation Age of Onset  . Hypertension Mother   . Heart disease Mother   . Diabetes Sister   . Diabetes Sister   . Diabetes Sister   . Diabetes Sister      History   Social History  . Marital Status: Married    Spouse Name: N/A  . Number of Children: N/A  . Years of Education: N/A   Occupational History  . Not on file.   Social History Main Topics  . Smoking status: Former Smoker -- 2.00 packs/day for 35 years    Types: Cigarettes    Quit date: 08/11/1994  . Smokeless tobacco: Never Used  . Alcohol Use: No  . Drug Use: No  . Sexual Activity: Not on file   Other Topics Concern  . Not on file   Social History Narrative     No Known Allergies   Outpatient Prescriptions Prior to Visit    Medication Sig Dispense Refill  . albuterol (PROVENTIL) (2.5 MG/3ML) 0.083% nebulizer solution Take 2.5 mg by nebulization every 6 (six) hours as needed for wheezing or shortness of breath.    Marland Kitchen  amLODipine (NORVASC) 5 MG tablet Take 5 mg by mouth daily.    Marland Kitchen aspirin EC 81 MG tablet Take 81 mg by mouth daily.    Marland Kitchen azithromycin (ZITHROMAX) 500 MG tablet Take 1 tablet (500 mg total) by mouth daily. 5 tablet 0  . Cholecalciferol (VITAMIN D) 2000 UNITS CAPS Take 2,000 Units by mouth daily.    . fish oil-omega-3 fatty acids 1000 MG capsule Take 2 g by mouth 2 (two) times daily.    Marland Kitchen guaiFENesin (MUCINEX) 600 MG 12 hr tablet Take 2 tablets (1,200 mg total) by mouth 2 (two) times daily. (Patient taking differently: Take 1,200 mg by mouth 2 (two) times daily as needed. ) 10 tablet 0  . Ipratropium-Albuterol (COMBIVENT RESPIMAT) 20-100 MCG/ACT AERS respimat Inhale 1 puff into the lungs every 6 (six) hours as needed for wheezing.     Marland Kitchen oxyCODONE-acetaminophen (PERCOCET/ROXICET) 5-325 MG per tablet Take 0.25 tablets by mouth every 4 (four) hours as needed for severe pain.    . predniSONE (DELTASONE) 5 MG tablet Label  & dispense according to the schedule below. 10 Pills PO for 3 days then, 8 Pills PO for 3 days, 6 Pills PO for 3 days, 4 Pills PO for 3 days, 2 Pills PO for 3 days, 1 Pills PO for 3 days, 1/2 Pill  PO for 3 days then STOP. 95 tablet 0  . PRESCRIPTION MEDICATION every 30 (thirty) days. Shot in eye every month at Dr. Zigmund Daniel office    . rosuvastatin (CRESTOR) 20 MG tablet Take 20 mg by mouth at bedtime.    . Tetrahydrozoline HCl (EYE DROPS OP) Place 4 drops into the left eye over 48 hr. Uses after she get shot in eye for 2 days    . albuterol (PROVENTIL HFA;VENTOLIN HFA) 108 (90 BASE) MCG/ACT inhaler Inhale 1 puff into the lungs every 6 (six) hours as needed for wheezing or shortness of breath.    . levofloxacin (LEVAQUIN) 500 MG tablet Take 1 tablet (500 mg total) by mouth daily. (Patient not  taking: Reported on 03/25/2015) 3 tablet 0   No facility-administered medications prior to visit.       Review of Systems  Constitutional: Negative for fever and unexpected weight change.  HENT: Negative for congestion, dental problem, ear pain, nosebleeds, postnasal drip, rhinorrhea, sinus pressure, sneezing, sore throat and trouble swallowing.   Eyes: Negative for redness and itching.  Respiratory: Positive for shortness of breath. Negative for cough, chest tightness and wheezing.   Cardiovascular: Negative for palpitations and leg swelling.  Gastrointestinal: Negative for nausea and vomiting.  Genitourinary: Negative for dysuria.  Musculoskeletal: Negative for joint swelling.  Skin: Negative for rash.  Neurological: Negative for headaches.  Hematological: Does not bruise/bleed easily.  Psychiatric/Behavioral: Negative for dysphoric mood. The patient is not nervous/anxious.        Objective:   Physical Exam Filed Vitals:   03/25/15 1033  BP: 112/60  Pulse: 75  Height: 5' 4.5" (1.638 m)  Weight: 177 lb (80.287 kg)  SpO2: 92%   3L [place]  Gen: chronically ill appearing, no acute distress HENT: NCAT, OP clear, neck supple without masses Eyes: PERRL, EOMi Lymph: no cervical lymphadenopathy PULM: wheezing bilaterally, normal effort, diminished R base, dullness to percussion right base CV: RRR, no mgr, no JVD GI: BS+, soft, nontender, no hsm Derm: no rash or skin breakdown MSK: normal bulk and tone Neuro: A&Ox4, CN II-XII intact, strength 5/5 in all 4 extremities Psyche: normal mood and affect  May 2015 hospitalization records reviewed where she had a pleural effusion removed, records from the pleural effusion shown exudative effusion with mixed type cells, total 2200 white blood cells cytology negative, culture negative  Chest x-ray from 03/23/2015 reviewed right-sided pleural effusion has significantly improved but there is persistent consolidation in the right base is  well as pleural thickening     Assessment & Plan:  COPD, moderate She carries a diagnosis of COPD but I do not have lung function test available to me. She smoked for nearly 45 pack years and quit in the 1990s. I do not doubt the diagnosis of COPD as she does have some wheezing on exam today. We need pulmonary function testing in order to quantify this. Her current treatment regimen is probably not adequate considering her burden of symptoms.  Plan: Stop Combivent Start Anoro  Full pulmonary function testing Instructed to use albuterol on an as-needed basis only   Pleural effusion She had a pleural effusion which was exudative in etiology, cytology normal, no evidence of infection. The cells identified or widely variant, though there were some eosinophils noted. I've reviewed all of her chest imaging since 2011 and it does appear to me that there is been some subtle scarring and/or pleural process that an evolving over the last few years in the right lung. The chest x-ray from earlier this week clearly shows a large right-sided pleural effusion but she was still left with some scarring versus consolidation in the right lung after thoracentesis.  Considering the fact that she had a pneumonia type syndrome treated as an outpatient about one month ago the most likely etiology is that this is an uncomplicated pleural effusion. However, considering her smoking history and her history of asbestos exposure I feel strongly when he to get a CT scan to ensure there is nothing else going on.  Plan: CT chest today without contrast      Current outpatient prescriptions:  .  albuterol (PROVENTIL) (2.5 MG/3ML) 0.083% nebulizer solution, Take 2.5 mg by nebulization every 6 (six) hours as needed for wheezing or shortness of breath., Disp: , Rfl:  .  amLODipine (NORVASC) 5 MG tablet, Take 5 mg by mouth daily., Disp: , Rfl:  .  aspirin EC 81 MG tablet, Take 81 mg by mouth daily., Disp: , Rfl:  .   azithromycin (ZITHROMAX) 500 MG tablet, Take 1 tablet (500 mg total) by mouth daily., Disp: 5 tablet, Rfl: 0 .  Cholecalciferol (VITAMIN D) 2000 UNITS CAPS, Take 2,000 Units by mouth daily., Disp: , Rfl:  .  fish oil-omega-3 fatty acids 1000 MG capsule, Take 2 g by mouth 2 (two) times daily., Disp: , Rfl:  .  guaiFENesin (MUCINEX) 600 MG 12 hr tablet, Take 2 tablets (1,200 mg total) by mouth 2 (two) times daily. (Patient taking differently: Take 1,200 mg by mouth 2 (two) times daily as needed. ), Disp: 10 tablet, Rfl: 0 .  Ipratropium-Albuterol (COMBIVENT RESPIMAT) 20-100 MCG/ACT AERS respimat, Inhale 1 puff into the lungs every 6 (six) hours as needed for wheezing. , Disp: , Rfl:  .  oxyCODONE-acetaminophen (PERCOCET/ROXICET) 5-325 MG per tablet, Take 0.25 tablets by mouth every 4 (four) hours as needed for severe pain., Disp: , Rfl:  .  predniSONE (DELTASONE) 5 MG tablet, Label  & dispense according to the schedule below. 10 Pills PO for 3 days then, 8 Pills PO for 3 days, 6 Pills PO for 3 days, 4 Pills PO for 3 days, 2 Pills PO for  3 days, 1 Pills PO for 3 days, 1/2 Pill  PO for 3 days then STOP., Disp: 95 tablet, Rfl: 0 .  PRESCRIPTION MEDICATION, every 30 (thirty) days. Shot in eye every month at Dr. Zigmund Daniel office, Disp: , Rfl:  .  rosuvastatin (CRESTOR) 20 MG tablet, Take 20 mg by mouth at bedtime., Disp: , Rfl:  .  Tetrahydrozoline HCl (EYE DROPS OP), Place 4 drops into the left eye over 48 hr. Uses after she get shot in eye for 2 days, Disp: , Rfl:  .  albuterol (PROVENTIL HFA;VENTOLIN HFA) 108 (90 BASE) MCG/ACT inhaler, Inhale 1 puff into the lungs every 6 (six) hours as needed for wheezing or shortness of breath., Disp: , Rfl:

## 2015-03-26 LAB — BODY FLUID CULTURE
Culture: NO GROWTH
SPECIAL REQUESTS: NORMAL

## 2015-03-28 LAB — CULTURE, BLOOD (ROUTINE X 2)
Culture: NO GROWTH
Culture: NO GROWTH

## 2015-03-29 ENCOUNTER — Inpatient Hospital Stay: Payer: Medicare Other | Admitting: Internal Medicine

## 2015-03-29 NOTE — Telephone Encounter (Signed)
lmtcb X2 for pt.  

## 2015-03-30 ENCOUNTER — Ambulatory Visit (HOSPITAL_COMMUNITY)
Admission: RE | Admit: 2015-03-30 | Discharge: 2015-03-30 | Disposition: A | Discharge status: Home / Self Care | Payer: Medicare Other | Source: Ambulatory Visit | Attending: Pulmonary Disease | Admitting: Pulmonary Disease

## 2015-03-30 DIAGNOSIS — J449 Chronic obstructive pulmonary disease, unspecified: Secondary | ICD-10-CM

## 2015-04-06 ENCOUNTER — Ambulatory Visit (HOSPITAL_COMMUNITY)
Admission: RE | Admit: 2015-04-06 | Discharge: 2015-04-06 | Disposition: A | Discharge status: Home / Self Care | Payer: Medicare Other | Source: Ambulatory Visit | Attending: Pulmonary Disease | Admitting: Pulmonary Disease

## 2015-04-06 DIAGNOSIS — R05 Cough: Secondary | ICD-10-CM | POA: Insufficient documentation

## 2015-04-06 DIAGNOSIS — J449 Chronic obstructive pulmonary disease, unspecified: Secondary | ICD-10-CM | POA: Diagnosis not present

## 2015-04-06 DIAGNOSIS — R062 Wheezing: Secondary | ICD-10-CM | POA: Insufficient documentation

## 2015-04-06 DIAGNOSIS — Z87891 Personal history of nicotine dependence: Secondary | ICD-10-CM | POA: Diagnosis not present

## 2015-04-06 MED ORDER — ALBUTEROL SULFATE (2.5 MG/3ML) 0.083% IN NEBU
2.5000 mg | INHALATION_SOLUTION | Freq: Once | RESPIRATORY_TRACT | Status: AC
  Administered 2015-04-06: 2.5 mg via RESPIRATORY_TRACT

## 2015-04-08 LAB — PULMONARY FUNCTION TEST
DL/VA % pred: 80 %
DL/VA: 3.9 ml/min/mmHg/L
DLCO unc % pred: 48 %
DLCO unc: 12.21 ml/min/mmHg
FEF 25-75 PRE: 0.3 L/s
FEF 25-75 Post: 0.41 L/sec
FEF2575-%Change-Post: 37 %
FEF2575-%PRED-PRE: 23 %
FEF2575-%Pred-Post: 31 %
FEV1-%Change-Post: 9 %
FEV1-%Pred-Post: 44 %
FEV1-%Pred-Pre: 40 %
FEV1-Post: 0.85 L
FEV1-Pre: 0.77 L
FEV1FVC-%Change-Post: 2 %
FEV1FVC-%Pred-Pre: 65 %
FEV6-%CHANGE-POST: 10 %
FEV6-%PRED-POST: 70 %
FEV6-%PRED-PRE: 63 %
FEV6-PRE: 1.54 L
FEV6-Post: 1.7 L
FEV6FVC-%Change-Post: 1 %
FEV6FVC-%PRED-POST: 104 %
FEV6FVC-%Pred-Pre: 103 %
FVC-%Change-Post: 7 %
FVC-%PRED-POST: 67 %
FVC-%Pred-Pre: 62 %
FVC-POST: 1.72 L
Post FEV1/FVC ratio: 49 %
Post FEV6/FVC ratio: 98 %
Pre FEV1/FVC ratio: 48 %
Pre FEV6/FVC Ratio: 97 %
RV % pred: 140 %
RV: 3.47 L
TLC % PRED: 103 %
TLC: 5.33 L

## 2015-04-11 ENCOUNTER — Encounter (INDEPENDENT_AMBULATORY_CARE_PROVIDER_SITE_OTHER): Payer: Medicare Other | Admitting: Ophthalmology

## 2015-04-11 DIAGNOSIS — H3532 Exudative age-related macular degeneration: Secondary | ICD-10-CM | POA: Diagnosis not present

## 2015-04-11 DIAGNOSIS — H3531 Nonexudative age-related macular degeneration: Secondary | ICD-10-CM

## 2015-04-11 DIAGNOSIS — E11329 Type 2 diabetes mellitus with mild nonproliferative diabetic retinopathy without macular edema: Secondary | ICD-10-CM | POA: Diagnosis not present

## 2015-04-11 DIAGNOSIS — H43813 Vitreous degeneration, bilateral: Secondary | ICD-10-CM

## 2015-04-11 DIAGNOSIS — I10 Essential (primary) hypertension: Secondary | ICD-10-CM | POA: Diagnosis not present

## 2015-04-12 ENCOUNTER — Ambulatory Visit (INDEPENDENT_AMBULATORY_CARE_PROVIDER_SITE_OTHER): Payer: Medicare Other | Admitting: Pulmonary Disease

## 2015-04-12 ENCOUNTER — Ambulatory Visit: Payer: Medicare Other | Admitting: Pulmonary Disease

## 2015-04-12 ENCOUNTER — Encounter: Payer: Self-pay | Admitting: Pulmonary Disease

## 2015-04-12 VITALS — BP 122/70 | HR 66 | Ht 64.5 in | Wt 180.0 lb

## 2015-04-12 DIAGNOSIS — J9611 Chronic respiratory failure with hypoxia: Secondary | ICD-10-CM

## 2015-04-12 DIAGNOSIS — J90 Pleural effusion, not elsewhere classified: Secondary | ICD-10-CM | POA: Diagnosis not present

## 2015-04-12 DIAGNOSIS — J449 Chronic obstructive pulmonary disease, unspecified: Secondary | ICD-10-CM | POA: Diagnosis not present

## 2015-04-12 MED ORDER — UMECLIDINIUM-VILANTEROL 62.5-25 MCG/INH IN AEPB: 1.0000 | INHALATION_SPRAY | Freq: Every day | RESPIRATORY_TRACT | Status: DC

## 2015-04-12 NOTE — Patient Instructions (Signed)
We will see you back in 6 months Keep using your oxygen and inhaler as you are doing Exercise regularly, including the sit to stand exercises I showed you today in clinic and make it a goal to walk 20 minutes a day If you need [place] to prescribe a rolling walker we can do this If you would like to be enrolled in pulmonary rehabilitation and we can also arrange that, just let us know We will repeat a chest x-ray in 6 months

## 2015-04-12 NOTE — Assessment & Plan Note (Signed)
Today her O2 saturation dropped to 77% on room air when ambulating into the clinic. It recovered to 94% on 2 L oxygen.  Plan: Continue 2 L of oxygen continuously

## 2015-04-12 NOTE — Assessment & Plan Note (Signed)
Her CT scan from this month showed near complete resolution of the pleural effusion which I believe was related to pneumonia. She is doing well from a symptom standpoint.  Plan: Repeat chest imaging in 6 months with a chest x-ray

## 2015-04-12 NOTE — Progress Notes (Signed)
Subjective:    Patient ID: Emily Owen, female    DOB: [birth year], 81 y.o.   MRN: [id number]  Synopsis: Referred in 2016 for severe COPD and evaluation of a pleural effusion after an episode of pneumonia.  HPI Chief Complaint  Patient presents with  . Follow-up    review pft and ct chest.  pt has no breathing complaints at this time, states she's doing better with SOB since starting anoro.    Feeling better since the last visit. She really thinks that the Anoro helps a lot.  She has been using it a lot.  She has been out shopping. No side effects.  She has macular degeneration and gets her eyes checked regularly.    Past Medical History  Diagnosis Date  . COPD (chronic obstructive pulmonary disease)   . Osteoporosis   . Cancer     colon  . Cancer of cervix     cervix  . Gout   . Gallstones   . Hard of hearing       Review of Systems  Constitutional: Negative for fever, chills and fatigue.  HENT: Negative for postnasal drip, rhinorrhea and sinus pressure.   Respiratory: Positive for shortness of breath. Negative for cough and wheezing.   Cardiovascular: Negative for chest pain, palpitations and leg swelling.       Objective:   Physical Exam Filed Vitals:   04/12/15 0958  BP: 122/70  Pulse: 66  Height: 5' 4.5" (1.638 m)  Weight: 180 lb (81.647 kg)  SpO2: 90%    2 L O2  Gen: well appearing HENT: OP clear, NCAT PULM: Few crackles bases, normal effort CV: RRR, no mgr, trace edema GI: BS+, soft, nontender Derm: no cyanosis or rash Psyche: normal mood and affect  June CT chest reviewed showing near complete resolution of the right-sided pleural effusion, there is very small residual pleural thickening but no pulmonary mass or pleural mass identified June 2016 pulmonary function testing showing severe COPD with a DLCO of 48% predicted      Assessment & Plan:  COPD, moderate She has severe COPD but has done well with the addition of a combination long  acting anti-muscarinic and beta agonist. She is not experiencing side effects from the medication. She would benefit from pulmonary rehabilitation but currently she lives too far away from [facility] to do this.  Plan: Continue current inhaler regimen I advised her to start exercising at home Follow-up 6 months  Pleural effusion Her CT scan from this month showed near complete resolution of the pleural effusion which I believe was related to pneumonia. She is doing well from a symptom standpoint.  Plan: Repeat chest imaging in 6 months with a chest x-ray  Chronic hypoxemic respiratory failure Today her O2 saturation dropped to 77% on room air when ambulating into the clinic. It recovered to 94% on 2 L oxygen.  Plan: Continue 2 L of oxygen continuously    Current outpatient prescriptions:  .  albuterol (PROVENTIL) (2.5 MG/3ML) 0.083% nebulizer solution, Take 2.5 mg by nebulization every 6 (six) hours as needed for wheezing or shortness of breath., Disp: , Rfl:  .  amLODipine (NORVASC) 5 MG tablet, Take 5 mg by mouth daily., Disp: , Rfl:  .  aspirin EC 81 MG tablet, Take 81 mg by mouth daily., Disp: , Rfl:  .  Cholecalciferol (VITAMIN D) 2000 UNITS CAPS, Take 2,000 Units by mouth daily., Disp: , Rfl:  .  fish oil-omega-3 fatty acids  1000 MG capsule, Take 2 g by mouth 2 (two) times daily., Disp: , Rfl:  .  guaiFENesin (MUCINEX) 600 MG 12 hr tablet, Take 2 tablets (1,200 mg total) by mouth 2 (two) times daily. (Patient taking differently: Take 1,200 mg by mouth 2 (two) times daily as needed. ), Disp: 10 tablet, Rfl: 0 .  Ipratropium-Albuterol (COMBIVENT RESPIMAT) 20-100 MCG/ACT AERS respimat, Inhale 1 puff into the lungs every 6 (six) hours as needed for wheezing. , Disp: , Rfl:  .  oxyCODONE-acetaminophen (PERCOCET/ROXICET) 5-325 MG per tablet, Take 0.25 tablets by mouth every 4 (four) hours as needed for severe pain., Disp: , Rfl:  .  PRESCRIPTION MEDICATION, every 30 (thirty)  days. Shot in eye every month at Dr. Zigmund Daniel office, Disp: , Rfl:  .  rosuvastatin (CRESTOR) 20 MG tablet, Take 20 mg by mouth at bedtime., Disp: , Rfl:  .  Tetrahydrozoline HCl (EYE DROPS OP), Place 4 drops into the left eye over 48 hr. Uses after she get shot in eye for 2 days, Disp: , Rfl:  .  Umeclidinium-Vilanterol 62.5-25 MCG/INH AEPB, Inhale 1 puff into the lungs daily., Disp: 60 each, Rfl: 5

## 2015-04-12 NOTE — Assessment & Plan Note (Signed)
She has severe COPD but has done well with the addition of a combination long acting anti-muscarinic and beta agonist. She is not experiencing side effects from the medication. She would benefit from pulmonary rehabilitation but currently she lives too far away from [facility] to do this.  Plan: Continue current inhaler regimen I advised her to start exercising at home Follow-up 6 months

## 2015-04-26 DIAGNOSIS — E785 Hyperlipidemia, unspecified: Secondary | ICD-10-CM | POA: Diagnosis not present

## 2015-04-26 DIAGNOSIS — J449 Chronic obstructive pulmonary disease, unspecified: Secondary | ICD-10-CM | POA: Diagnosis not present

## 2015-04-26 DIAGNOSIS — R7301 Impaired fasting glucose: Secondary | ICD-10-CM | POA: Diagnosis not present

## 2015-05-06 ENCOUNTER — Encounter (INDEPENDENT_AMBULATORY_CARE_PROVIDER_SITE_OTHER): Payer: Medicare Other | Admitting: Ophthalmology

## 2015-05-06 DIAGNOSIS — E11329 Type 2 diabetes mellitus with mild nonproliferative diabetic retinopathy without macular edema: Secondary | ICD-10-CM | POA: Diagnosis not present

## 2015-05-06 DIAGNOSIS — E11319 Type 2 diabetes mellitus with unspecified diabetic retinopathy without macular edema: Secondary | ICD-10-CM

## 2015-05-06 DIAGNOSIS — H3531 Nonexudative age-related macular degeneration: Secondary | ICD-10-CM

## 2015-05-06 DIAGNOSIS — H34813 Central retinal vein occlusion, bilateral: Secondary | ICD-10-CM | POA: Diagnosis not present

## 2015-05-06 DIAGNOSIS — H3532 Exudative age-related macular degeneration: Secondary | ICD-10-CM | POA: Diagnosis not present

## 2015-05-16 ENCOUNTER — Institutional Professional Consult (permissible substitution): Payer: Medicare Other | Admitting: Internal Medicine

## 2015-05-24 DIAGNOSIS — B351 Tinea unguium: Secondary | ICD-10-CM | POA: Diagnosis not present

## 2015-05-24 DIAGNOSIS — E119 Type 2 diabetes mellitus without complications: Secondary | ICD-10-CM | POA: Diagnosis not present

## 2015-05-24 DIAGNOSIS — L84 Corns and callosities: Secondary | ICD-10-CM | POA: Diagnosis not present

## 2015-06-01 ENCOUNTER — Encounter: Payer: Self-pay | Admitting: Internal Medicine

## 2015-06-01 ENCOUNTER — Ambulatory Visit (INDEPENDENT_AMBULATORY_CARE_PROVIDER_SITE_OTHER): Payer: Medicare Other | Admitting: Internal Medicine

## 2015-06-01 ENCOUNTER — Ambulatory Visit (INDEPENDENT_AMBULATORY_CARE_PROVIDER_SITE_OTHER)
Admission: RE | Admit: 2015-06-01 | Discharge: 2015-06-01 | Disposition: A | Discharge status: Home / Self Care | Payer: Medicare Other | Source: Ambulatory Visit | Attending: Internal Medicine | Admitting: Internal Medicine

## 2015-06-01 VITALS — BP 130/68 | HR 60 | Ht 64.0 in | Wt 180.0 lb

## 2015-06-01 DIAGNOSIS — J449 Chronic obstructive pulmonary disease, unspecified: Secondary | ICD-10-CM | POA: Diagnosis not present

## 2015-06-01 DIAGNOSIS — J948 Other specified pleural conditions: Secondary | ICD-10-CM

## 2015-06-01 DIAGNOSIS — J90 Pleural effusion, not elsewhere classified: Secondary | ICD-10-CM | POA: Diagnosis not present

## 2015-06-01 DIAGNOSIS — J9612 Chronic respiratory failure with hypercapnia: Secondary | ICD-10-CM | POA: Diagnosis not present

## 2015-06-01 MED ORDER — PREDNISONE 10 MG PO TABS: ORAL_TABLET | ORAL | Status: DC

## 2015-06-01 NOTE — Patient Instructions (Addendum)
Prednisone 10 mg take  4 each am x 2 days,   2 each am x 2 days,  1 each am x 2 days and stop   Only use your combivent respimat as a rescue medication to be used if you can't catch your breath by resting or doing a relaxed purse lip breathing pattern.  - The less you use it, the better it will work when you need it. - Ok to use up to 1 puffs  every 4 hours if you must   - Don't leave home without it !!  (think of it like the spare tire for your car)   If not satisfied after combivent then use albuterol neb up to 4 hours but let us know right away that you've had to start using it more regularly   Please remember to go to the  x-ray department downstairs for your tests - we will call you with the results when they are available.  Please schedule a follow up office visit in 2 weeks, sooner if needed with [name]

## 2015-06-01 NOTE — Progress Notes (Signed)
Subjective:    Patient ID: Emily Owen, female    DOB: 1929/12/07,    MRN: [id number]  Synopsis: Referred in 2016 for severe COPD and evaluation of a pleural effusion after an episode of pneumonia.  HPI Ov 04/12/15 [name]  Chief Complaint  Patient presents with  . Follow-up    review pft and ct chest.  pt has no breathing complaints at this time, states she's doing better with SOB since starting anoro.   Feeling better since the last visit. She really thinks that the Anoro helps a lot.  She has been using it a lot.  She has been out shopping. No side effects.  She has macular degeneration and gets her eyes checked regularly.   rec Keep using your oxygen and inhaler as you are doing Exercise regularly, including the sit to stand exercises I showed you today in clinic and make it a goal to walk 20 minutes a day If you need [place] to prescribe a rolling walker we can do this If you would like to be enrolled in pulmonary rehabilitation and we can also arrange that, just let us know We will repeat a chest x-ray in 6 months    06/01/2015 acute ov/Ayyub Krall re: worse sob/ recurrent back discomfort on R "just like before when I had fluid" Chief Complaint  Patient presents with  . Acute Visit    Pt states that her breathing has been worse for the past 2 wks. She gets out of breath walking across the street to her sisters house. She has also been having some right side back pain. She tried taking lasix and this helped her symptoms some, but her rx is out of date.     Gradually worse since stopped prednisone, on 3lpm ( never was  2lpm per pt)  No more cough than usual / ok at rest but uncomfortable lying flat now.  Discomfort in R chest not worse with cough or deep breath. Using anoro but confused on how /when to use combivent and neb now and really hasn't used much of either.  No obvious day to day or daytime variability or assoc   cough   or chest tightness, subjective wheeze or overt sinus  or hb symptoms. No unusual exp hx or h/o childhood pna/ asthma or knowledge of premature birth.  Sleeping ok without nocturnal  or early am exacerbation  of respiratory  c/o's or need for noct saba. Also denies any obvious fluctuation of symptoms with weather or environmental changes or other aggravating or alleviating factors except as outlined above   Current Medications, Allergies, Complete Past Medical History, Past Surgical History, Family History, and Social History were reviewed in [employer] record.  ROS  The following are not active complaints unless bolded sore throat, dysphagia, dental problems, itching, sneezing,  nasal congestion or excess/ purulent secretions, ear ache,   fever, chills, sweats, unintended wt loss, classically pleuritic or exertional cp, hemoptysis,  orthopnea pnd or leg swelling, presyncope, palpitations, abdominal pain, anorexia, nausea, vomiting, diarrhea  or change in bowel or bladder habits, change in stools or urine, dysuria,hematuria,  rash, arthralgias, visual complaints, headache, numbness, weakness or ataxia or problems with walking or coordination,  change in mood/affect or memory.             Objective:   Physical Exam    Wt Readings from Last 3 Encounters:  06/01/15 180 lb (81.647 kg)  04/12/15 180 lb (81.647 kg)  03/25/15 177 lb (  80.287 kg)    Vital signs reviewed   Gen: chronically ill appearing amb elderly nad at rest  HENT: OP clear, NCAT PULM: Few insp crackles both  Bases, dullness and decreased bs on R base  CV: RRR, no mgr, trace edema GI: BS+, soft, nontender Derm: no cyanosis or rash Psyche: normal mood and affect  June CT chest reviewed showing near complete resolution of the right-sided pleural effusion, there is very small residual pleural thickening but no pulmonary mass or pleural mass identified June 2016 pulmonary function testing showing severe COPD with a DLCO of 48% predicted    CXR PA and  Lateral:   06/01/2015 :     I personally reviewed images and agree with radiology impression as follows:    Moderate right pleural effusion which is significantly increased compared to prior exam. Assessment & Plan:

## 2015-06-02 ENCOUNTER — Encounter: Payer: Self-pay | Admitting: Internal Medicine

## 2015-06-02 DIAGNOSIS — J9612 Chronic respiratory failure with hypercapnia: Secondary | ICD-10-CM | POA: Insufficient documentation

## 2015-06-02 NOTE — Assessment & Plan Note (Signed)
She was suspected of having a parapneumonic effusion on R previously with a definite residual rim of R fluid on last CT 03/25/15  that was slt irreg in post aspect so may be partially loculated but now is clearly worse (she says it got worse p she stopped prednisone suggesting an inflammatory component for sure) and will need retap and w/u > will defer to [name] to expedite this as outpt hopefully.

## 2015-06-02 NOTE — Assessment & Plan Note (Signed)
-    HCO3 = 35 03/23/15 rx = 3lpm 24 /7  Adequate control on present rx, reviewed > no change in rx needed

## 2015-06-02 NOTE — Assessment & Plan Note (Addendum)
June 2016 pulmonary function testing ratio 49%, FEV1 0.85 L (44% predicted, 9% change postbronchodilator), total lung capacity 5.33 L (103% predicted), DLCO 12.21 (48% predicted)  The proper method of use, as well as anticipated side effects, of a metered-dose inhaler are discussed and demonstrated to the patient. Improved effectiveness after extensive coaching during this visit to a level of approximately  90% with respimat   So she is GOLD III / 02 dep copd ab baseline s typical flare but says much worse since off pred so until we sort this out rec Prednisone 10 mg take  4 each am x 2 days,   2 each am x 2 days,  1 each am x 2 days and stop and f/u with [name] w/in 2 weeks  I had an extended discussion with the patient reviewing all relevant studies completed to date and  lasting 15 to 20 minutes of a 25 minute visit    Each maintenance medication was reviewed in detail including most importantly the difference between maintenance and prns and under what circumstances the prns are to be triggered using an action plan format that is not reflected in the computer generated alphabetically organized AVS.    Please see instructions for details which were reviewed in writing and the patient given a copy highlighting the part that I personally wrote and discussed at today's ov.

## 2015-06-06 DIAGNOSIS — R7301 Impaired fasting glucose: Secondary | ICD-10-CM | POA: Diagnosis not present

## 2015-06-06 DIAGNOSIS — E785 Hyperlipidemia, unspecified: Secondary | ICD-10-CM | POA: Diagnosis not present

## 2015-06-07 ENCOUNTER — Encounter: Payer: Self-pay | Admitting: Pulmonary Disease

## 2015-06-07 NOTE — Telephone Encounter (Signed)
Per CXR results: Notes Recorded by Tanda Rockers, MD on 06/02/2015 at 6:37 AM Call pt: Reviewed cxr > the fluid is back so i will let [name] know and he should be in touch today to decide next step  Per Pt email sent in today: Dr. Lake Bells I had my mom with Dr. Melvyn Novas last week with trouble breathing and back pain. Chest x-ray shows pleural effusion. Wert's nurse called and said she would get a call from you last week. Checking to see next steps. She does have an appt scheduled with you on Aug 29 --  Please advise Dr. Lake Bells thanks

## 2015-06-08 ENCOUNTER — Telehealth: Payer: Self-pay

## 2015-06-08 DIAGNOSIS — J90 Pleural effusion, not elsewhere classified: Secondary | ICD-10-CM

## 2015-06-08 NOTE — Telephone Encounter (Signed)
Spoke with pt, aware of thoracentesis being ordered.  Order and labs ordered.

## 2015-06-08 NOTE — Telephone Encounter (Signed)
-----   Message from Juanito Doom, MD sent at 06/08/2015  2:46 AM EDT ----- Caryl Pina,  I just saw this note from [place] today.  This lady had a CXR showing an increased pleural effusion.  She needs a thoracentesis ordered through IR, needs to be done this week. Pleural fluid labs: cytology, culture, cell count, protein, ldh.  Blood work: LDH, protein.  Thanks [employer]

## 2015-06-09 ENCOUNTER — Ambulatory Visit (HOSPITAL_COMMUNITY): Payer: Medicare Other

## 2015-06-10 ENCOUNTER — Telehealth: Payer: Self-pay | Admitting: Pulmonary Disease

## 2015-06-10 ENCOUNTER — Emergency Department (HOSPITAL_COMMUNITY): Payer: Medicare Other

## 2015-06-10 ENCOUNTER — Ambulatory Visit (HOSPITAL_COMMUNITY)
Admission: RE | Admit: 2015-06-10 | Discharge: 2015-06-10 | Disposition: A | Discharge status: Home / Self Care | Payer: Medicare Other | Source: Ambulatory Visit | Attending: Pulmonary Disease | Admitting: Pulmonary Disease

## 2015-06-10 ENCOUNTER — Inpatient Hospital Stay (HOSPITAL_COMMUNITY)
Admission: EM | Admit: 2015-06-10 | Discharge: 2015-06-14 | DRG: 194 | Disposition: A | Discharge status: Home Health | Payer: Medicare Other | Attending: Internal Medicine | Admitting: Internal Medicine

## 2015-06-10 ENCOUNTER — Ambulatory Visit (HOSPITAL_COMMUNITY)
Admission: RE | Admit: 2015-06-10 | Discharge: 2015-06-10 | Disposition: A | Discharge status: Home / Self Care | Payer: Medicare Other | Source: Ambulatory Visit | Attending: Radiology | Admitting: Radiology

## 2015-06-10 ENCOUNTER — Encounter (HOSPITAL_COMMUNITY): Payer: Self-pay

## 2015-06-10 ENCOUNTER — Encounter: Payer: Self-pay | Admitting: Pulmonary Disease

## 2015-06-10 DIAGNOSIS — R509 Fever, unspecified: Secondary | ICD-10-CM | POA: Diagnosis not present

## 2015-06-10 DIAGNOSIS — J9611 Chronic respiratory failure with hypoxia: Secondary | ICD-10-CM | POA: Diagnosis not present

## 2015-06-10 DIAGNOSIS — R040 Epistaxis: Secondary | ICD-10-CM | POA: Diagnosis present

## 2015-06-10 DIAGNOSIS — E871 Hypo-osmolality and hyponatremia: Secondary | ICD-10-CM | POA: Diagnosis present

## 2015-06-10 DIAGNOSIS — E1151 Type 2 diabetes mellitus with diabetic peripheral angiopathy without gangrene: Secondary | ICD-10-CM | POA: Insufficient documentation

## 2015-06-10 DIAGNOSIS — S0990XA Unspecified injury of head, initial encounter: Secondary | ICD-10-CM | POA: Diagnosis not present

## 2015-06-10 DIAGNOSIS — M549 Dorsalgia, unspecified: Secondary | ICD-10-CM

## 2015-06-10 DIAGNOSIS — I700 Atherosclerosis of aorta: Secondary | ICD-10-CM

## 2015-06-10 DIAGNOSIS — Z961 Presence of intraocular lens: Secondary | ICD-10-CM | POA: Diagnosis present

## 2015-06-10 DIAGNOSIS — J189 Pneumonia, unspecified organism: Principal | ICD-10-CM | POA: Diagnosis present

## 2015-06-10 DIAGNOSIS — E1165 Type 2 diabetes mellitus with hyperglycemia: Secondary | ICD-10-CM | POA: Diagnosis present

## 2015-06-10 DIAGNOSIS — Z7982 Long term (current) use of aspirin: Secondary | ICD-10-CM

## 2015-06-10 DIAGNOSIS — Z9842 Cataract extraction status, left eye: Secondary | ICD-10-CM

## 2015-06-10 DIAGNOSIS — S32039A Unspecified fracture of third lumbar vertebra, initial encounter for closed fracture: Secondary | ICD-10-CM | POA: Diagnosis not present

## 2015-06-10 DIAGNOSIS — M542 Cervicalgia: Secondary | ICD-10-CM | POA: Diagnosis not present

## 2015-06-10 DIAGNOSIS — J9811 Atelectasis: Secondary | ICD-10-CM | POA: Diagnosis present

## 2015-06-10 DIAGNOSIS — J449 Chronic obstructive pulmonary disease, unspecified: Secondary | ICD-10-CM | POA: Diagnosis present

## 2015-06-10 DIAGNOSIS — Z833 Family history of diabetes mellitus: Secondary | ICD-10-CM

## 2015-06-10 DIAGNOSIS — M109 Gout, unspecified: Secondary | ICD-10-CM | POA: Diagnosis present

## 2015-06-10 DIAGNOSIS — K5900 Constipation, unspecified: Secondary | ICD-10-CM | POA: Diagnosis present

## 2015-06-10 DIAGNOSIS — Z9889 Other specified postprocedural states: Secondary | ICD-10-CM | POA: Diagnosis not present

## 2015-06-10 DIAGNOSIS — J90 Pleural effusion, not elsewhere classified: Secondary | ICD-10-CM | POA: Diagnosis not present

## 2015-06-10 DIAGNOSIS — Z66 Do not resuscitate: Secondary | ICD-10-CM | POA: Diagnosis present

## 2015-06-10 DIAGNOSIS — H3530 Unspecified macular degeneration: Secondary | ICD-10-CM | POA: Diagnosis present

## 2015-06-10 DIAGNOSIS — S023XXA Fracture of orbital floor, initial encounter for closed fracture: Secondary | ICD-10-CM | POA: Diagnosis present

## 2015-06-10 DIAGNOSIS — Z87891 Personal history of nicotine dependence: Secondary | ICD-10-CM

## 2015-06-10 DIAGNOSIS — E785 Hyperlipidemia, unspecified: Secondary | ICD-10-CM | POA: Diagnosis present

## 2015-06-10 DIAGNOSIS — S199XXA Unspecified injury of neck, initial encounter: Secondary | ICD-10-CM | POA: Diagnosis not present

## 2015-06-10 DIAGNOSIS — I2510 Atherosclerotic heart disease of native coronary artery without angina pectoris: Secondary | ICD-10-CM | POA: Diagnosis present

## 2015-06-10 DIAGNOSIS — Z90710 Acquired absence of both cervix and uterus: Secondary | ICD-10-CM

## 2015-06-10 DIAGNOSIS — Z79899 Other long term (current) drug therapy: Secondary | ICD-10-CM

## 2015-06-10 DIAGNOSIS — M810 Age-related osteoporosis without current pathological fracture: Secondary | ICD-10-CM | POA: Diagnosis present

## 2015-06-10 DIAGNOSIS — Y92019 Unspecified place in single-family (private) house as the place of occurrence of the external cause: Secondary | ICD-10-CM

## 2015-06-10 DIAGNOSIS — Z8541 Personal history of malignant neoplasm of cervix uteri: Secondary | ICD-10-CM

## 2015-06-10 DIAGNOSIS — N390 Urinary tract infection, site not specified: Secondary | ICD-10-CM | POA: Diagnosis present

## 2015-06-10 DIAGNOSIS — R05 Cough: Secondary | ICD-10-CM | POA: Diagnosis not present

## 2015-06-10 DIAGNOSIS — W19XXXA Unspecified fall, initial encounter: Secondary | ICD-10-CM | POA: Diagnosis present

## 2015-06-10 DIAGNOSIS — R40241 Glasgow coma scale score 13-15: Secondary | ICD-10-CM | POA: Diagnosis not present

## 2015-06-10 DIAGNOSIS — H9190 Unspecified hearing loss, unspecified ear: Secondary | ICD-10-CM | POA: Diagnosis present

## 2015-06-10 DIAGNOSIS — Z85038 Personal history of other malignant neoplasm of large intestine: Secondary | ICD-10-CM

## 2015-06-10 DIAGNOSIS — J918 Pleural effusion in other conditions classified elsewhere: Secondary | ICD-10-CM | POA: Diagnosis not present

## 2015-06-10 DIAGNOSIS — W010XXA Fall on same level from slipping, tripping and stumbling without subsequent striking against object, initial encounter: Secondary | ICD-10-CM | POA: Diagnosis present

## 2015-06-10 DIAGNOSIS — Z9981 Dependence on supplemental oxygen: Secondary | ICD-10-CM

## 2015-06-10 DIAGNOSIS — R42 Dizziness and giddiness: Secondary | ICD-10-CM | POA: Diagnosis not present

## 2015-06-10 DIAGNOSIS — J948 Other specified pleural conditions: Secondary | ICD-10-CM | POA: Diagnosis not present

## 2015-06-10 DIAGNOSIS — S0083XA Contusion of other part of head, initial encounter: Secondary | ICD-10-CM | POA: Diagnosis present

## 2015-06-10 DIAGNOSIS — Z9841 Cataract extraction status, right eye: Secondary | ICD-10-CM

## 2015-06-10 DIAGNOSIS — S299XXA Unspecified injury of thorax, initial encounter: Secondary | ICD-10-CM | POA: Diagnosis not present

## 2015-06-10 DIAGNOSIS — Z8249 Family history of ischemic heart disease and other diseases of the circulatory system: Secondary | ICD-10-CM

## 2015-06-10 DIAGNOSIS — Y95 Nosocomial condition: Secondary | ICD-10-CM | POA: Diagnosis present

## 2015-06-10 DIAGNOSIS — S02401A Maxillary fracture, unspecified, initial encounter for closed fracture: Secondary | ICD-10-CM | POA: Diagnosis present

## 2015-06-10 HISTORY — DX: Pleural effusion, not elsewhere classified: J90

## 2015-06-10 LAB — GRAM STAIN

## 2015-06-10 LAB — BODY FLUID CELL COUNT WITH DIFFERENTIAL
EOS FL: 1 %
Lymphs, Fluid: 60 %
MONOCYTE-MACROPHAGE-SEROUS FLUID: 31 % — AB (ref 50–90)
Neutrophil Count, Fluid: 8 % (ref 0–25)
Total Nucleated Cell Count, Fluid: 1618 cu mm — ABNORMAL HIGH (ref 0–1000)

## 2015-06-10 LAB — PROTEIN, BODY FLUID: TOTAL PROTEIN, FLUID: 4.5 g/dL

## 2015-06-10 LAB — LACTATE DEHYDROGENASE, PLEURAL OR PERITONEAL FLUID: LD FL: 91 U/L — AB (ref 3–23)

## 2015-06-10 MED ORDER — DOXYCYCLINE HYCLATE 100 MG PO TABS: 100.0000 mg | ORAL_TABLET | Freq: Two times a day (BID) | ORAL | Status: DC

## 2015-06-10 NOTE — Telephone Encounter (Signed)
Called and LMTCB x1 on both #'s

## 2015-06-10 NOTE — Telephone Encounter (Signed)
Patient's daughter, [name], says that patient just had fluid withdrawn from lungs today.  She is still at [facility] right now.  Patient is coughing up green mucus.  Wants to know if they can get an antibiotic called into pharmacy.  Daughter says that the last few days her O2 has decreased, she has felt very lethargic.  Patient doing better since having fluid drawn, but PA there mentioned that she may need antibiotic for the green mucus that she is coughing up and told her that she would need to get it through Dr. Lake Bells.  Pharmacy: [place]   No Known Allergies

## 2015-06-10 NOTE — ED Notes (Signed)
Pt in by ems after having a fall at home. Per ems, pt fell while trying to get out of the bed, landed face-first on the floor.  Pt has a bloody nose and several other abrasions and injuries to arms.

## 2015-06-10 NOTE — Telephone Encounter (Signed)
Doxycycline 100mg po bid x 7 days 

## 2015-06-10 NOTE — ED Provider Notes (Signed)
CSN: [id number]     Arrival date & time 06/10/15  2256 History  This chart was scribed for Emily Schmidt, MD by Irene Pap, ED Scribe. This patient was seen in room [facility]18/[facility]18 and patient care was started at 11:11 PM.   Chief Complaint  Patient presents with  . Fall  . Head Injury   The history is provided by the patient. No language interpreter was used.  HPI Comments: Emily Owen is a 81 y.o. female with hx of COPD, pleural effusion, and who presents to the Emergency Department complaining of a fall and head injury onset PTA. Family states that she was laying in bed all day and got out of bed to go to the bathroom, when she fell and landed on her face, sustaining abrasions to the head and a bloody nose. They believe she may have tripped over her oxygen leads, uses 3 L at home daily. They say that she has been asleep all day and has been lethargic the past few days. Reports additional labored breathing and productive cough, for which she has been prescribed doxycycline. Family states that the fall was not witnessed. 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 Denies measured fever. Pt takes aspirin daily, but denies any other blood thinners. Family states that she just moved to the area and they are currently in the process of getting her set up with a PCP and pulmonary doctor.   Past Medical History  Diagnosis Date  . COPD (chronic obstructive pulmonary disease)   . Osteoporosis   . Cancer     colon  . Cancer of cervix     cervix  . Gout   . Gallstones   . Hard of hearing   . Pleural effusion    Past Surgical History  Procedure Laterality Date  . Abdominal hysterectomy    . Orthopedic surgery    . Colon surgery    . Gallstone surgery      stones removed but still has gallbladder   Family History  Problem Relation Age of Onset  . Hypertension Mother   . Heart disease Mother   . Diabetes Sister    . Diabetes Sister   . Diabetes Sister   . Diabetes Sister    Social History  Substance Use Topics  . Smoking status: Former Smoker -- 2.00 packs/day for 35 years    Types: Cigarettes    Quit date: 08/11/1994  . Smokeless tobacco: Never Used  . Alcohol Use: No   OB History    No data available     Review of Systems  Constitutional: Positive for fatigue. Negative for fever.  HENT: Positive for nosebleeds.   Respiratory: Positive for cough.        Labored breathing  Skin: Positive for wound.  Neurological: Positive for headaches.  All other systems reviewed and are negative.  Allergies  Review of patient's allergies indicates no known allergies.  Home Medications   Prior to Admission medications   Medication Sig Start Date End Date Taking? Authorizing Provider  albuterol (PROVENTIL) (2.5 MG/3ML) 0.083% nebulizer solution Take 2.5 mg by nebulization every 6 (six) hours as needed for wheezing or shortness of breath.   Yes Historical Provider, MD  amLODipine (NORVASC) 5 MG tablet Take 5 mg by mouth daily.   Yes Historical Provider, MD  aspirin EC 81 MG tablet Take 81 mg by mouth daily.  Yes Historical Provider, MD  Cholecalciferol (VITAMIN D) 2000 UNITS CAPS Take 2,000 Units by mouth daily.   Yes Historical Provider, MD  doxycycline (VIBRA-TABS) 100 MG tablet Take 1 tablet (100 mg total) by mouth 2 (two) times daily. 06/10/15  Yes Juanito Doom, MD  fish oil-omega-3 fatty acids 1000 MG capsule Take 2 g by mouth 2 (two) times daily.   Yes Historical Provider, MD  FUROSEMIDE PO Take 1 tablet by mouth once as needed (for fluid).   Yes Historical Provider, MD  guaiFENesin (MUCINEX) 600 MG 12 hr tablet Take 2 tablets (1,200 mg total) by mouth 2 (two) times daily. Patient taking differently: Take 1,200 mg by mouth 2 (two) times daily as needed.  08/13/14  Yes Nishant Dhungel, MD  Ipratropium-Albuterol (COMBIVENT RESPIMAT) 20-100 MCG/ACT AERS respimat Inhale 1 puff into the lungs  every 6 (six) hours as needed for wheezing.    Yes Historical Provider, MD  PRESCRIPTION MEDICATION every 30 (thirty) days. Shot in eye every month at Dr. Zigmund Daniel office   Yes Historical Provider, MD  rosuvastatin (CRESTOR) 20 MG tablet Take 20 mg by mouth at bedtime.   Yes Historical Provider, MD  Tetrahydrozoline HCl (EYE DROPS OP) Place 4 drops into the left eye over 48 hr. Uses after she get shot in eye for 2 days   Yes Historical Provider, MD  Umeclidinium-Vilanterol 62.5-25 MCG/INH AEPB Inhale 1 puff into the lungs daily. 04/12/15  Yes Juanito Doom, MD  predniSONE (DELTASONE) 10 MG tablet Take  4 each am x 2 days,   2 each am x 2 days,  1 each am x 2 days and stop Patient not taking: Reported on 06/10/2015 06/01/15   Tanda Rockers, MD   BP 143/80 mmHg  Pulse 84  Wt 180 lb (81.647 kg)  SpO2 97%  Physical Exam  Constitutional: She is oriented to person, place, and time. She appears well-developed and well-nourished.  HENT:  Head: Normocephalic.  Right Ear: External ear normal.  Left Ear: External ear normal.  Mouth/Throat: Oropharynx is clear and moist.  Swelling and bruising to right maxillary sinus region;  swelling of right upper lip without laceration; no midface instability  Eyes: Conjunctivae and EOM are normal. Pupils are equal, round, and reactive to light.  mild right periorbital swelling and ecchymosis;  Neck: Neck supple.  immobilized in c collar  Cardiovascular: Normal rate, regular rhythm and normal heart sounds.   Pulmonary/Chest: Effort normal and breath sounds normal.  non tender chest  Abdominal: Soft. Bowel sounds are normal.  abdomen non tender  Musculoskeletal: Normal range of motion.  no cervical spine tenderness; Full ROM of major joints in both upper and lower extremities  Neurological: She is alert and oriented to person, place, and time.  Skin: Skin is warm and dry.  Psychiatric: She has a normal mood and affect. Her behavior is normal.  Nursing  note and vitals reviewed.   ED Course  Procedures (including critical care time) DIAGNOSTIC STUDIES: Oxygen Saturation is 97% on RA, normal by my interpretation.    COORDINATION OF CARE: 11:19 PM-Discussed treatment plan which includes CT scan and labs with pt at bedside and pt agreed to plan.   Labs Review Labs Reviewed  CBC WITH DIFFERENTIAL/PLATELET - Abnormal; Notable for the following:    WBC 19.4 (*)    Hemoglobin 11.6 (*)    Neutrophils Relative % 84 (*)    Neutro Abs 16.1 (*)    Lymphocytes Relative 7 (*)  Monocytes Absolute 1.8 (*)    All other components within normal limits  COMPREHENSIVE METABOLIC PANEL - Abnormal; Notable for the following:    Sodium 132 (*)    Chloride 87 (*)    Glucose, Bld 296 (*)    BUN 46 (*)    Creatinine, Ser 1.12 (*)    Calcium 8.7 (*)    Albumin 3.4 (*)    Total Bilirubin 1.5 (*)    GFR calc non Af Amer 44 (*)    GFR calc Af Amer 52 (*)    All other components within normal limits  URINALYSIS, ROUTINE W REFLEX MICROSCOPIC (NOT AT [facility]) - Abnormal; Notable for the following:    Specific Gravity, Urine >1.030 (*)    Hgb urine dipstick TRACE (*)    Bilirubin Urine SMALL (*)    Protein, ur 100 (*)    All other components within normal limits  URINE MICROSCOPIC-ADD ON - Abnormal; Notable for the following:    Squamous Epithelial / LPF FEW (*)    Bacteria, UA MANY (*)    All other components within normal limits  CULTURE, BLOOD (ROUTINE X 2)  CULTURE, BLOOD (ROUTINE X 2)  URINE CULTURE  LACTIC ACID, PLASMA  LACTIC ACID, PLASMA   WBC  Date Value Ref Range Status  06/11/2015 19.4* 4.0 - 10.5 K/uL Final  03/23/2015 6.8 4.0 - 10.5 K/uL Final  03/22/2015 9.3 4.0 - 10.5 K/uL Final  03/22/2015 7.3 4.0 - 10.5 K/uL Final      BUN  Date Value Ref Range Status  06/11/2015 46* 6 - 20 mg/dL Final  03/23/2015 29* 6 - 20 mg/dL Final  03/22/2015 26* 6 - 20 mg/dL Final  03/22/2015 27* 6 - 20 mg/dL Final   CREATININE, SER  Date Value  Ref Range Status  06/11/2015 1.12* 0.44 - 1.00 mg/dL Final  03/23/2015 1.00 0.44 - 1.00 mg/dL Final  03/22/2015 0.75 0.44 - 1.00 mg/dL Final  03/22/2015 0.81 0.44 - 1.00 mg/dL Final       Imaging Review Dg Chest 1 View  06/11/2015   CLINICAL DATA:  Fall with head injury.  EXAM: CHEST  1 VIEW  COMPARISON:  Chest x-ray from earlier today  FINDINGS: Coarsened lower lung markings and streaky basilar opacities as previously seen. Patient has emphysema based on June 2016 chest CT. Small right pleural effusion is less apparent than prior, likely positional. No evidence of fracture or pneumothorax. Normal heart size and mediastinal contours.  IMPRESSION: No change from earlier today to suggest acute injury.   Electronically Signed   By: Monte Fantasia M.D.   On: 06/11/2015 00:28   Dg Chest 1 View  06/10/2015   CLINICAL DATA:  Status post right thoracentesis.  EXAM: CHEST  1 VIEW  COMPARISON:  06/01/2015  FINDINGS: The E cardiac silhouette, mediastinal and hilar contours are within normal limits and stable. There is tortuosity and calcification of the thoracic aorta. The right-sided pleural effusion is smaller. No postprocedural pneumothorax identified. The left lung is clear. Rounded nodular density in the left lower thorax is likely a nipple shadow.  IMPRESSION: Interval decrease in size of the right pleural effusion. No postprocedural pneumothorax.   Electronically Signed   By: Marijo Sanes M.D.   On: 06/10/2015 14:47   Ct Head Wo Contrast  06/11/2015   CLINICAL DATA:  Unwitnessed fall on way to bathroom, landing on face. Abrasions to head and nose. Dizziness and weakness, neck pain and headache. History of hypertension and hyperlipidemia.  EXAM: CT HEAD WITHOUT CONTRAST  CT MAXILLOFACIAL WITHOUT CONTRAST  CT CERVICAL SPINE WITHOUT CONTRAST  TECHNIQUE: Multidetector CT imaging of the head, cervical spine, and maxillofacial structures were performed using the standard protocol without intravenous  contrast. Multiplanar CT image reconstructions of the cervical spine and maxillofacial structures were also generated.  COMPARISON:  None.  FINDINGS: CT HEAD FINDINGS  The ventricles and sulci are normal for age. No intraparenchymal hemorrhage, mass effect nor midline shift. Confluent supratentorial white matter hypodensities are within normal range for patient's age and though non-specific suggest sequelae of chronic small vessel ischemic disease. No acute large vascular territory infarcts.  No abnormal extra-axial fluid collections. Basal cisterns are patent. Mild calcific atherosclerosis of the carotid siphons. No skull fracture. Moderate RIGHT frontal scalp hematoma.  CT MAXILLOFACIAL FINDINGS  RIGHT orbital floor blow-out fracture with 2-3 mm depressed bony fragments, no external of orbital contents. Slight irregularity of the RIGHT lamina papyracea equivocal for nondisplaced fracture. No additional facial fractures. RIGHT maxillary superior antral mucosal thickening with small air-fluid level consistent with blood products. RIGHT concha bullosa. Mild ethmoid mucosal thickening. Mastoid air cells are well aerated. No destructive bony lesions. Patient is edentulous.  Ocular globes intact, status post bilateral ocular lens implants. Normal appearance optic nerve sheath complexes. No retrobulbar hematoma. Extraocular muscles are normal in appearance. Larger RIGHT facial subcutaneous hematoma with fat stranding, no subcutaneous gas or radiopaque foreign bodies.  CT CERVICAL SPINE FINDINGS  Cervical vertebral bodies and posterior elements intact and aligned with maintenance of cervical lordosis. Moderate to severe C5-6, severe C6-7 disc height loss, vacuum disc, uncovertebral hypertrophy and marginal spurring consistent with degenerative disc. Severe RIGHT C5-6 and moderate bilateral C6-7 neural foraminal narrowing. C1-2 articulation maintained. No destructive bony lesions. 12 mm RIGHT thyroid nodule, below size  followup recommendations. No prevertebral soft tissue swelling.  IMPRESSION: CT HEAD: Moderate RIGHT frontal scalp hematoma.  No skull fracture.  No acute intracranial process.  Severe white matter changes compatible chronic small vessel ischemic disease. Involutional changes.  CT MAXILLOFACIAL: Acute mildly displaced RIGHT orbital floor blowout fracture, no external herniation of orbital contents. Possible nondisplaced RIGHT lamina papyracea fracture. No postseptal hematoma.  RIGHT maxillary hemo sinus.  CT CERVICAL SPINE: No acute fracture or malalignment.   Electronically Signed   By: Elon Alas M.D.   On: 06/11/2015 01:02   Ct Cervical Spine Wo Contrast  06/11/2015   CLINICAL DATA:  Unwitnessed fall on way to bathroom, landing on face. Abrasions to head and nose. Dizziness and weakness, neck pain and headache. History of hypertension and hyperlipidemia.  EXAM: CT HEAD WITHOUT CONTRAST  CT MAXILLOFACIAL WITHOUT CONTRAST  CT CERVICAL SPINE WITHOUT CONTRAST  TECHNIQUE: Multidetector CT imaging of the head, cervical spine, and maxillofacial structures were performed using the standard protocol without intravenous contrast. Multiplanar CT image reconstructions of the cervical spine and maxillofacial structures were also generated.  COMPARISON:  None.  FINDINGS: CT HEAD FINDINGS  The ventricles and sulci are normal for age. No intraparenchymal hemorrhage, mass effect nor midline shift. Confluent supratentorial white matter hypodensities are within normal range for patient's age and though non-specific suggest sequelae of chronic small vessel ischemic disease. No acute large vascular territory infarcts.  No abnormal extra-axial fluid collections. Basal cisterns are patent. Mild calcific atherosclerosis of the carotid siphons. No skull fracture. Moderate RIGHT frontal scalp hematoma.  CT MAXILLOFACIAL FINDINGS  RIGHT orbital floor blow-out fracture with 2-3 mm depressed bony fragments, no external of orbital  contents. Slight irregularity of  the RIGHT lamina papyracea equivocal for nondisplaced fracture. No additional facial fractures. RIGHT maxillary superior antral mucosal thickening with small air-fluid level consistent with blood products. RIGHT concha bullosa. Mild ethmoid mucosal thickening. Mastoid air cells are well aerated. No destructive bony lesions. Patient is edentulous.  Ocular globes intact, status post bilateral ocular lens implants. Normal appearance optic nerve sheath complexes. No retrobulbar hematoma. Extraocular muscles are normal in appearance. Larger RIGHT facial subcutaneous hematoma with fat stranding, no subcutaneous gas or radiopaque foreign bodies.  CT CERVICAL SPINE FINDINGS  Cervical vertebral bodies and posterior elements intact and aligned with maintenance of cervical lordosis. Moderate to severe C5-6, severe C6-7 disc height loss, vacuum disc, uncovertebral hypertrophy and marginal spurring consistent with degenerative disc. Severe RIGHT C5-6 and moderate bilateral C6-7 neural foraminal narrowing. C1-2 articulation maintained. No destructive bony lesions. 12 mm RIGHT thyroid nodule, below size followup recommendations. No prevertebral soft tissue swelling.  IMPRESSION: CT HEAD: Moderate RIGHT frontal scalp hematoma.  No skull fracture.  No acute intracranial process.  Severe white matter changes compatible chronic small vessel ischemic disease. Involutional changes.  CT MAXILLOFACIAL: Acute mildly displaced RIGHT orbital floor blowout fracture, no external herniation of orbital contents. Possible nondisplaced RIGHT lamina papyracea fracture. No postseptal hematoma.  RIGHT maxillary hemo sinus.  CT CERVICAL SPINE: No acute fracture or malalignment.   Electronically Signed   By: Elon Alas M.D.   On: 06/11/2015 01:02   Ct Maxillofacial Wo Cm  06/11/2015   CLINICAL DATA:  Unwitnessed fall on way to bathroom, landing on face. Abrasions to head and nose. Dizziness and weakness, neck  pain and headache. History of hypertension and hyperlipidemia.  EXAM: CT HEAD WITHOUT CONTRAST  CT MAXILLOFACIAL WITHOUT CONTRAST  CT CERVICAL SPINE WITHOUT CONTRAST  TECHNIQUE: Multidetector CT imaging of the head, cervical spine, and maxillofacial structures were performed using the standard protocol without intravenous contrast. Multiplanar CT image reconstructions of the cervical spine and maxillofacial structures were also generated.  COMPARISON:  None.  FINDINGS: CT HEAD FINDINGS  The ventricles and sulci are normal for age. No intraparenchymal hemorrhage, mass effect nor midline shift. Confluent supratentorial white matter hypodensities are within normal range for patient's age and though non-specific suggest sequelae of chronic small vessel ischemic disease. No acute large vascular territory infarcts.  No abnormal extra-axial fluid collections. Basal cisterns are patent. Mild calcific atherosclerosis of the carotid siphons. No skull fracture. Moderate RIGHT frontal scalp hematoma.  CT MAXILLOFACIAL FINDINGS  RIGHT orbital floor blow-out fracture with 2-3 mm depressed bony fragments, no external of orbital contents. Slight irregularity of the RIGHT lamina papyracea equivocal for nondisplaced fracture. No additional facial fractures. RIGHT maxillary superior antral mucosal thickening with small air-fluid level consistent with blood products. RIGHT concha bullosa. Mild ethmoid mucosal thickening. Mastoid air cells are well aerated. No destructive bony lesions. Patient is edentulous.  Ocular globes intact, status post bilateral ocular lens implants. Normal appearance optic nerve sheath complexes. No retrobulbar hematoma. Extraocular muscles are normal in appearance. Larger RIGHT facial subcutaneous hematoma with fat stranding, no subcutaneous gas or radiopaque foreign bodies.  CT CERVICAL SPINE FINDINGS  Cervical vertebral bodies and posterior elements intact and aligned with maintenance of cervical lordosis.  Moderate to severe C5-6, severe C6-7 disc height loss, vacuum disc, uncovertebral hypertrophy and marginal spurring consistent with degenerative disc. Severe RIGHT C5-6 and moderate bilateral C6-7 neural foraminal narrowing. C1-2 articulation maintained. No destructive bony lesions. 12 mm RIGHT thyroid nodule, below size followup recommendations. No prevertebral soft tissue swelling.  IMPRESSION: CT HEAD: Moderate RIGHT frontal scalp hematoma.  No skull fracture.  No acute intracranial process.  Severe white matter changes compatible chronic small vessel ischemic disease. Involutional changes.  CT MAXILLOFACIAL: Acute mildly displaced RIGHT orbital floor blowout fracture, no external herniation of orbital contents. Possible nondisplaced RIGHT lamina papyracea fracture. No postseptal hematoma.  RIGHT maxillary hemo sinus.  CT CERVICAL SPINE: No acute fracture or malalignment.   Electronically Signed   By: Elon Alas M.D.   On: 06/11/2015 01:02   US Thoracentesis Asp Pleural Space W/img Guide  06/10/2015   INDICATION: Symptomatic R sided pleural effusion  EXAM: US THORACENTESIS ASP PLEURAL SPACE W/IMG GUIDE  COMPARISON:  Previous thora  MEDICATIONS: 10 cc 1% lidocaine  COMPLICATIONS: None immediate  TECHNIQUE: Informed written consent was obtained from the patient after a discussion of the risks, benefits and alternatives to treatment. A timeout was performed prior to the initiation of the procedure.  Initial ultrasound scanning demonstrates a right pleural effusion. The lower chest was prepped and draped in the usual sterile fashion. 1% lidocaine was used for local anesthesia.  Under direct ultrasound guidance, a 19 gauge, 7-cm, Yueh catheter was introduced. An ultrasound image was saved for documentation purposes. The thoracentesis was performed. The catheter was removed and a dressing was applied. The patient tolerated the procedure well without immediate post procedural complication. The patient was  escorted to have an upright chest radiograph.  FINDINGS: A total of approximately 500 cc of yellow fluid was removed. Requested samples were sent to the laboratory.  IMPRESSION: Successful ultrasound-guided R sided thoracentesis yielding 500 cc of pleural fluid.  Read by:  Lavonia Drafts [facility]   Electronically Signed   By: Marybelle Killings M.D.   On: 06/10/2015 14:51  I personally reviewed the imaging tests through PACS system I reviewed available ER/hospitalization records through the EMR    EKG Interpretation None      MDM   Final diagnoses:  HCAP (healthcare-associated pneumonia)  Fever, unspecified fever cause  Fall, initial encounter    Unclear etiology of fall.  Suspect mechanical but sig be could've been involved.  Decreased activity of the past several days.  Fever to 102 here in the emergency department.  Suspect healthcare associated pneumonia given productive cough.  Patient non-stable 3 L nasal cannula for her advanced COPD.  Patient be started on vancomycin and Fortaz.  Mild elevation in BUN suggesting developing dehydration and renal insufficiency.  IV hydration emergency department.  Lactate is reassuring.  In regards to her traumatic fall.  Her CT head and C-spine is without acute pathology C2 maxillary facial demonstrates right orbital blowout fracture.  She has normal vision out of her right eye and normal extraocular movements.  Outpatient ENT follow-up will be necessary.  Ice will be applied to the face to help with swelling.  Morphine for pain control.  Pancultured.  Admit to the hospital  I personally performed the services described in this documentation, which was scribed in my presence. The recorded information has been reviewed and is accurate.     Emily Schmidt, MD 06/11/15 [phone]

## 2015-06-10 NOTE — Telephone Encounter (Signed)
Daughter sent in pt email and I advised her we sent in [place]. Nothing further needed

## 2015-06-10 NOTE — Procedures (Signed)
   US guided Rt thora 500 cc yellow fluid Sent for labs  Tolerated well  cxr pending

## 2015-06-11 ENCOUNTER — Encounter (HOSPITAL_COMMUNITY): Payer: Self-pay | Admitting: Internal Medicine

## 2015-06-11 ENCOUNTER — Emergency Department (HOSPITAL_COMMUNITY): Payer: Medicare Other

## 2015-06-11 DIAGNOSIS — E119 Type 2 diabetes mellitus without complications: Secondary | ICD-10-CM | POA: Diagnosis not present

## 2015-06-11 DIAGNOSIS — S299XXA Unspecified injury of thorax, initial encounter: Secondary | ICD-10-CM | POA: Diagnosis not present

## 2015-06-11 DIAGNOSIS — J449 Chronic obstructive pulmonary disease, unspecified: Secondary | ICD-10-CM

## 2015-06-11 DIAGNOSIS — N390 Urinary tract infection, site not specified: Secondary | ICD-10-CM | POA: Diagnosis present

## 2015-06-11 DIAGNOSIS — Z833 Family history of diabetes mellitus: Secondary | ICD-10-CM | POA: Diagnosis not present

## 2015-06-11 DIAGNOSIS — I2510 Atherosclerotic heart disease of native coronary artery without angina pectoris: Secondary | ICD-10-CM | POA: Diagnosis present

## 2015-06-11 DIAGNOSIS — S0990XA Unspecified injury of head, initial encounter: Secondary | ICD-10-CM | POA: Diagnosis not present

## 2015-06-11 DIAGNOSIS — Z90710 Acquired absence of both cervix and uterus: Secondary | ICD-10-CM | POA: Diagnosis not present

## 2015-06-11 DIAGNOSIS — Y92019 Unspecified place in single-family (private) house as the place of occurrence of the external cause: Secondary | ICD-10-CM | POA: Diagnosis not present

## 2015-06-11 DIAGNOSIS — Z8249 Family history of ischemic heart disease and other diseases of the circulatory system: Secondary | ICD-10-CM | POA: Diagnosis not present

## 2015-06-11 DIAGNOSIS — M810 Age-related osteoporosis without current pathological fracture: Secondary | ICD-10-CM | POA: Diagnosis present

## 2015-06-11 DIAGNOSIS — E785 Hyperlipidemia, unspecified: Secondary | ICD-10-CM

## 2015-06-11 DIAGNOSIS — S02401A Maxillary fracture, unspecified, initial encounter for closed fracture: Secondary | ICD-10-CM | POA: Diagnosis not present

## 2015-06-11 DIAGNOSIS — R040 Epistaxis: Secondary | ICD-10-CM | POA: Diagnosis present

## 2015-06-11 DIAGNOSIS — J189 Pneumonia, unspecified organism: Secondary | ICD-10-CM | POA: Diagnosis not present

## 2015-06-11 DIAGNOSIS — Z85038 Personal history of other malignant neoplasm of large intestine: Secondary | ICD-10-CM | POA: Diagnosis not present

## 2015-06-11 DIAGNOSIS — W010XXA Fall on same level from slipping, tripping and stumbling without subsequent striking against object, initial encounter: Secondary | ICD-10-CM | POA: Diagnosis present

## 2015-06-11 DIAGNOSIS — S3993XA Unspecified injury of pelvis, initial encounter: Secondary | ICD-10-CM | POA: Diagnosis not present

## 2015-06-11 DIAGNOSIS — J948 Other specified pleural conditions: Secondary | ICD-10-CM

## 2015-06-11 DIAGNOSIS — J90 Pleural effusion, not elsewhere classified: Secondary | ICD-10-CM | POA: Diagnosis not present

## 2015-06-11 DIAGNOSIS — W19XXXA Unspecified fall, initial encounter: Secondary | ICD-10-CM | POA: Diagnosis present

## 2015-06-11 DIAGNOSIS — H3530 Unspecified macular degeneration: Secondary | ICD-10-CM | POA: Diagnosis present

## 2015-06-11 DIAGNOSIS — I700 Atherosclerosis of aorta: Secondary | ICD-10-CM

## 2015-06-11 DIAGNOSIS — Z66 Do not resuscitate: Secondary | ICD-10-CM | POA: Diagnosis present

## 2015-06-11 DIAGNOSIS — R509 Fever, unspecified: Secondary | ICD-10-CM | POA: Diagnosis not present

## 2015-06-11 DIAGNOSIS — Z7982 Long term (current) use of aspirin: Secondary | ICD-10-CM | POA: Diagnosis not present

## 2015-06-11 DIAGNOSIS — S199XXA Unspecified injury of neck, initial encounter: Secondary | ICD-10-CM | POA: Diagnosis not present

## 2015-06-11 DIAGNOSIS — S0083XA Contusion of other part of head, initial encounter: Secondary | ICD-10-CM | POA: Diagnosis present

## 2015-06-11 DIAGNOSIS — Z9841 Cataract extraction status, right eye: Secondary | ICD-10-CM | POA: Diagnosis not present

## 2015-06-11 DIAGNOSIS — M542 Cervicalgia: Secondary | ICD-10-CM | POA: Diagnosis not present

## 2015-06-11 DIAGNOSIS — S023XXA Fracture of orbital floor, initial encounter for closed fracture: Secondary | ICD-10-CM | POA: Diagnosis not present

## 2015-06-11 DIAGNOSIS — H9190 Unspecified hearing loss, unspecified ear: Secondary | ICD-10-CM | POA: Diagnosis present

## 2015-06-11 DIAGNOSIS — J9611 Chronic respiratory failure with hypoxia: Secondary | ICD-10-CM | POA: Diagnosis present

## 2015-06-11 DIAGNOSIS — K5900 Constipation, unspecified: Secondary | ICD-10-CM | POA: Diagnosis present

## 2015-06-11 DIAGNOSIS — R42 Dizziness and giddiness: Secondary | ICD-10-CM | POA: Diagnosis not present

## 2015-06-11 DIAGNOSIS — S32039A Unspecified fracture of third lumbar vertebra, initial encounter for closed fracture: Secondary | ICD-10-CM | POA: Diagnosis present

## 2015-06-11 DIAGNOSIS — Z87891 Personal history of nicotine dependence: Secondary | ICD-10-CM | POA: Diagnosis not present

## 2015-06-11 DIAGNOSIS — M109 Gout, unspecified: Secondary | ICD-10-CM | POA: Diagnosis present

## 2015-06-11 DIAGNOSIS — Z9842 Cataract extraction status, left eye: Secondary | ICD-10-CM | POA: Diagnosis not present

## 2015-06-11 DIAGNOSIS — E1151 Type 2 diabetes mellitus with diabetic peripheral angiopathy without gangrene: Secondary | ICD-10-CM | POA: Insufficient documentation

## 2015-06-11 DIAGNOSIS — E1165 Type 2 diabetes mellitus with hyperglycemia: Secondary | ICD-10-CM | POA: Diagnosis present

## 2015-06-11 DIAGNOSIS — Y95 Nosocomial condition: Secondary | ICD-10-CM | POA: Diagnosis present

## 2015-06-11 DIAGNOSIS — E871 Hypo-osmolality and hyponatremia: Secondary | ICD-10-CM | POA: Diagnosis present

## 2015-06-11 DIAGNOSIS — Z961 Presence of intraocular lens: Secondary | ICD-10-CM | POA: Diagnosis present

## 2015-06-11 DIAGNOSIS — Z9981 Dependence on supplemental oxygen: Secondary | ICD-10-CM | POA: Diagnosis not present

## 2015-06-11 DIAGNOSIS — Z8541 Personal history of malignant neoplasm of cervix uteri: Secondary | ICD-10-CM | POA: Diagnosis not present

## 2015-06-11 DIAGNOSIS — R05 Cough: Secondary | ICD-10-CM | POA: Diagnosis not present

## 2015-06-11 DIAGNOSIS — S3991XA Unspecified injury of abdomen, initial encounter: Secondary | ICD-10-CM | POA: Diagnosis not present

## 2015-06-11 DIAGNOSIS — J9811 Atelectasis: Secondary | ICD-10-CM | POA: Diagnosis present

## 2015-06-11 DIAGNOSIS — Z79899 Other long term (current) drug therapy: Secondary | ICD-10-CM | POA: Diagnosis not present

## 2015-06-11 LAB — CBC WITH DIFFERENTIAL/PLATELET
BASOS ABS: 0 10*3/uL (ref 0.0–0.1)
BASOS PCT: 0 % (ref 0–1)
EOS ABS: 0 10*3/uL (ref 0.0–0.7)
Eosinophils Relative: 0 % (ref 0–5)
HEMATOCRIT: 36.5 % (ref 36.0–46.0)
Hemoglobin: 11.6 g/dL — ABNORMAL LOW (ref 12.0–15.0)
Lymphocytes Relative: 7 % — ABNORMAL LOW (ref 12–46)
Lymphs Abs: 1.4 10*3/uL (ref 0.7–4.0)
MCH: 26.7 pg (ref 26.0–34.0)
MCHC: 31.8 g/dL (ref 30.0–36.0)
MCV: 84.1 fL (ref 78.0–100.0)
MONO ABS: 1.8 10*3/uL — AB (ref 0.1–1.0)
Monocytes Relative: 9 % (ref 3–12)
NEUTROS ABS: 16.1 10*3/uL — AB (ref 1.7–7.7)
NEUTROS PCT: 84 % — AB (ref 43–77)
Platelets: 279 10*3/uL (ref 150–400)
RBC: 4.34 MIL/uL (ref 3.87–5.11)
RDW: 14.8 % (ref 11.5–15.5)
WBC: 19.4 10*3/uL — ABNORMAL HIGH (ref 4.0–10.5)

## 2015-06-11 LAB — URINALYSIS, ROUTINE W REFLEX MICROSCOPIC
GLUCOSE, UA: NEGATIVE mg/dL
Ketones, ur: NEGATIVE mg/dL
LEUKOCYTES UA: NEGATIVE
Nitrite: NEGATIVE
Protein, ur: 100 mg/dL — AB
Urobilinogen, UA: 1 mg/dL (ref 0.0–1.0)
pH: 5.5 (ref 5.0–8.0)

## 2015-06-11 LAB — GLUCOSE, CAPILLARY
GLUCOSE-CAPILLARY: 246 mg/dL — AB (ref 65–99)
GLUCOSE-CAPILLARY: 288 mg/dL — AB (ref 65–99)
Glucose-Capillary: 235 mg/dL — ABNORMAL HIGH (ref 65–99)

## 2015-06-11 LAB — COMPREHENSIVE METABOLIC PANEL
ALBUMIN: 3.4 g/dL — AB (ref 3.5–5.0)
ALT: 15 U/L (ref 14–54)
AST: 19 U/L (ref 15–41)
Alkaline Phosphatase: 77 U/L (ref 38–126)
Anion gap: 13 (ref 5–15)
BILIRUBIN TOTAL: 1.5 mg/dL — AB (ref 0.3–1.2)
BUN: 46 mg/dL — AB (ref 6–20)
CHLORIDE: 87 mmol/L — AB (ref 101–111)
CO2: 32 mmol/L (ref 22–32)
Calcium: 8.7 mg/dL — ABNORMAL LOW (ref 8.9–10.3)
Creatinine, Ser: 1.12 mg/dL — ABNORMAL HIGH (ref 0.44–1.00)
GFR calc Af Amer: 52 mL/min — ABNORMAL LOW (ref >60)
GFR calc non Af Amer: 44 mL/min — ABNORMAL LOW (ref >60)
GLUCOSE: 296 mg/dL — AB (ref 65–99)
POTASSIUM: 4.3 mmol/L (ref 3.5–5.1)
SODIUM: 132 mmol/L — AB (ref 135–145)
TOTAL PROTEIN: 7 g/dL (ref 6.5–8.1)

## 2015-06-11 LAB — URINE MICROSCOPIC-ADD ON

## 2015-06-11 LAB — EXPECTORATED SPUTUM ASSESSMENT W REFEX TO RESP CULTURE

## 2015-06-11 LAB — LACTIC ACID, PLASMA
Lactic Acid, Venous: 1.6 mmol/L (ref 0.5–2.0)
Lactic Acid, Venous: 1.5 mmol/L (ref 0.5–2.0)

## 2015-06-11 LAB — MRSA PCR SCREENING: MRSA BY PCR: NEGATIVE

## 2015-06-11 LAB — EXPECTORATED SPUTUM ASSESSMENT W GRAM STAIN, RFLX TO RESP C

## 2015-06-11 MED ORDER — IPRATROPIUM-ALBUTEROL 0.5-2.5 (3) MG/3ML IN SOLN
3.0000 mL | Freq: Three times a day (TID) | RESPIRATORY_TRACT | Status: DC
  Administered 2015-06-11 (×2): 3 mL via RESPIRATORY_TRACT
  Filled 2015-06-11 (×2): qty 3

## 2015-06-11 MED ORDER — SODIUM CHLORIDE 0.9 % IJ SOLN
3.0000 mL | Freq: Two times a day (BID) | INTRAMUSCULAR | Status: DC
Start: 2015-06-11 — End: 2015-06-14
  Administered 2015-06-12 – 2015-06-14 (×4): 3 mL via INTRAVENOUS

## 2015-06-11 MED ORDER — ALBUTEROL SULFATE (2.5 MG/3ML) 0.083% IN NEBU: 2.5000 mg | INHALATION_SOLUTION | Freq: Four times a day (QID) | RESPIRATORY_TRACT | Status: DC | PRN

## 2015-06-11 MED ORDER — GUAIFENESIN ER 600 MG PO TB12
600.0000 mg | ORAL_TABLET | Freq: Two times a day (BID) | ORAL | Status: DC
  Administered 2015-06-11 – 2015-06-14 (×7): 600 mg via ORAL
  Filled 2015-06-11 (×9): qty 1

## 2015-06-11 MED ORDER — MORPHINE SULFATE (PF) 4 MG/ML IV SOLN
4.0000 mg | Freq: Once | INTRAVENOUS | Status: DC
Start: 2015-06-11 — End: 2015-06-11
  Filled 2015-06-11: qty 1

## 2015-06-11 MED ORDER — VANCOMYCIN HCL IN DEXTROSE 1-5 GM/200ML-% IV SOLN: 1000.0000 mg | Freq: Once | INTRAVENOUS | Status: DC

## 2015-06-11 MED ORDER — PREDNISONE 20 MG PO TABS
40.0000 mg | ORAL_TABLET | Freq: Every day | ORAL | Status: AC
  Administered 2015-06-11 – 2015-06-13 (×3): 40 mg via ORAL
  Filled 2015-06-11 (×4): qty 2

## 2015-06-11 MED ORDER — IPRATROPIUM-ALBUTEROL 0.5-2.5 (3) MG/3ML IN SOLN: 3.0000 mL | Freq: Four times a day (QID) | RESPIRATORY_TRACT | Status: DC | PRN

## 2015-06-11 MED ORDER — UMECLIDINIUM-VILANTEROL 62.5-25 MCG/INH IN AEPB
1.0000 | INHALATION_SPRAY | Freq: Every day | RESPIRATORY_TRACT | Status: DC
Start: 2015-06-11 — End: 2015-06-14

## 2015-06-11 MED ORDER — IPRATROPIUM-ALBUTEROL 20-100 MCG/ACT IN AERS: 1.0000 | INHALATION_SPRAY | Freq: Four times a day (QID) | RESPIRATORY_TRACT | Status: DC | PRN

## 2015-06-11 MED ORDER — ROSUVASTATIN CALCIUM 20 MG PO TABS
20.0000 mg | ORAL_TABLET | Freq: Every day | ORAL | Status: DC
Start: 2015-06-11 — End: 2015-06-14
  Administered 2015-06-11 – 2015-06-13 (×3): 20 mg via ORAL
  Filled 2015-06-11 (×3): qty 1

## 2015-06-11 MED ORDER — ASPIRIN EC 81 MG PO TBEC
81.0000 mg | DELAYED_RELEASE_TABLET | Freq: Every day | ORAL | Status: DC
  Administered 2015-06-11 – 2015-06-14 (×4): 81 mg via ORAL
  Filled 2015-06-11 (×4): qty 1

## 2015-06-11 MED ORDER — LEVOFLOXACIN IN D5W 750 MG/150ML IV SOLN
750.0000 mg | INTRAVENOUS | Status: DC
Start: 2015-06-11 — End: 2015-06-13
  Administered 2015-06-11 – 2015-06-13 (×3): 750 mg via INTRAVENOUS
  Filled 2015-06-11 (×3): qty 150

## 2015-06-11 MED ORDER — SODIUM CHLORIDE 0.9 % IV SOLN: 1000.0000 mL | Freq: Once | INTRAVENOUS | Status: DC

## 2015-06-11 MED ORDER — KETOROLAC TROMETHAMINE 30 MG/ML IJ SOLN: 30.0000 mg | Freq: Four times a day (QID) | INTRAMUSCULAR | Status: DC | PRN

## 2015-06-11 MED ORDER — SODIUM CHLORIDE 0.9 % IV SOLN: 1000.0000 mL | INTRAVENOUS | Status: DC

## 2015-06-11 MED ORDER — OXYCODONE-ACETAMINOPHEN 5-325 MG PO TABS
1.0000 | ORAL_TABLET | ORAL | Status: DC | PRN
  Administered 2015-06-11: 2 via ORAL
  Administered 2015-06-11 (×4): 1 via ORAL
  Administered 2015-06-12 (×2): 2 via ORAL
  Administered 2015-06-12 (×2): 1 via ORAL
  Administered 2015-06-13 – 2015-06-14 (×4): 2 via ORAL
  Filled 2015-06-11: qty 1
  Filled 2015-06-11 (×4): qty 2
  Filled 2015-06-11: qty 1
  Filled 2015-06-11 (×3): qty 2
  Filled 2015-06-11 (×4): qty 1

## 2015-06-11 MED ORDER — OMEGA-3-ACID ETHYL ESTERS 1 G PO CAPS
2.0000 g | ORAL_CAPSULE | Freq: Two times a day (BID) | ORAL | Status: DC
  Administered 2015-06-11 – 2015-06-14 (×7): 2 g via ORAL
  Filled 2015-06-11 (×7): qty 2

## 2015-06-11 MED ORDER — SODIUM CHLORIDE 0.9 % IV SOLN
INTRAVENOUS | Status: AC
  Administered 2015-06-11: 19:00:00 via INTRAVENOUS

## 2015-06-11 MED ORDER — INSULIN ASPART 100 UNIT/ML ~~LOC~~ SOLN
0.0000 [IU] | Freq: Three times a day (TID) | SUBCUTANEOUS | Status: DC
  Administered 2015-06-11 (×2): 5 [IU] via SUBCUTANEOUS
  Administered 2015-06-12: 3 [IU] via SUBCUTANEOUS
  Administered 2015-06-12: 5 [IU] via SUBCUTANEOUS
  Administered 2015-06-12: 11 [IU] via SUBCUTANEOUS
  Administered 2015-06-13: 3 [IU] via SUBCUTANEOUS
  Administered 2015-06-13: 5 [IU] via SUBCUTANEOUS
  Administered 2015-06-14 (×2): 3 [IU] via SUBCUTANEOUS

## 2015-06-11 MED ORDER — IPRATROPIUM-ALBUTEROL 0.5-2.5 (3) MG/3ML IN SOLN
3.0000 mL | Freq: Three times a day (TID) | RESPIRATORY_TRACT | Status: DC
  Administered 2015-06-12 – 2015-06-14 (×8): 3 mL via RESPIRATORY_TRACT
  Filled 2015-06-11 (×8): qty 3

## 2015-06-11 MED ORDER — DEXTROSE 5 % IV SOLN
2.0000 g | Freq: Three times a day (TID) | INTRAVENOUS | Status: DC
  Filled 2015-06-11: qty 2

## 2015-06-11 NOTE — Consult Note (Signed)
Name: Emily Owen MRN: [id number] DOB: 21-Sep-1930    ADMISSION DATE:  06/10/2015 CONSULTATION DATE:  06/11/15  REFERRING MD :  TRH  CHIEF COMPLAINT:  Pleural effusion  BRIEF PATIENT DESCRIPTION: 74 yoF former smoker with GOLD III COPD, O2 dependent (DrMcQuaid/ Dr Melvyn Novas) admitted after fall w R orbital blow-out fx. Malaise since April. Recurrent exudative R pleural effusion tapped 03/2015 and again 06/10/15.  Prednisone may have helped suppress effusion, unconfirmed.  SIGNIFICANT EVENTS  Fall, blow-out fx R orbit 8/19  STUDIES:  Thoracentesis 6/1-sterile exudate Thoracentesis 8/19   HISTORY OF PRESENT ILLNESS:  47 yoF former smoker with COPD, O2 dep 3L/ [employer] (GOLD III, FEV1 44%) and past hx hypercapnea. Fell at home,(? tripped on O2 cord) and suffered scalp hematoma, bruise to back, R orbital blow-out fx, R hemosinus.  Noted malaise since April while living in [place]. Recently moved here. Evaluated by [facility] for exudative R pleural effusion initially tapped 6/1. Seemed to improve with pred taper and Anoro. Seen again 8/10 by MW noting reaccumulation of effusion. Given pred taper. Doxycycline called in. Second R thoracentesis 8/19-exudate,1618 WBC, cytopath and cultures pending. There has been some recent cough yellow green, and maybe low grade temp. No bleeding, adenopathy or other acute concern.WBC 8/19 19,400- possibly from recent steroids as taper from 8/10 has just ended. CT chest 6/3- emphysema, pneumobilia, thyroid nosule, small R effusion, aortic atherosclerosis. CXR p tap 8/20- coarse markings, small R effusion. Now on levaquin, prednisone, Anoro and nebs.  PAST MEDICAL HISTORY :   has a past medical history of COPD (chronic obstructive pulmonary disease); Osteoporosis; Cancer; Cancer of cervix; Gout; Gallstones; Hard of hearing; and Pleural effusion.  has past surgical history that includes Abdominal hysterectomy; orthopedic surgery; Colon surgery; and gallstone  surgery. Prior to Admission medications   Medication Sig Start Date End Date Taking? Authorizing Provider  albuterol (PROVENTIL) (2.5 MG/3ML) 0.083% nebulizer solution Take 2.5 mg by nebulization every 6 (six) hours as needed for wheezing or shortness of breath.   Yes Historical Provider, MD  amLODipine (NORVASC) 5 MG tablet Take 5 mg by mouth daily.   Yes Historical Provider, MD  aspirin EC 81 MG tablet Take 81 mg by mouth daily.   Yes Historical Provider, MD  Cholecalciferol (VITAMIN D) 2000 UNITS CAPS Take 2,000 Units by mouth daily.   Yes Historical Provider, MD  doxycycline (VIBRA-TABS) 100 MG tablet Take 1 tablet (100 mg total) by mouth 2 (two) times daily. 06/10/15  Yes Juanito Doom, MD  fish oil-omega-3 fatty acids 1000 MG capsule Take 2 g by mouth 2 (two) times daily.   Yes Historical Provider, MD  FUROSEMIDE PO Take 1 tablet by mouth once as needed (for fluid).   Yes Historical Provider, MD  guaiFENesin (MUCINEX) 600 MG 12 hr tablet Take 2 tablets (1,200 mg total) by mouth 2 (two) times daily. Patient taking differently: Take 1,200 mg by mouth 2 (two) times daily as needed.  08/13/14  Yes Nishant Dhungel, MD  Ipratropium-Albuterol (COMBIVENT RESPIMAT) 20-100 MCG/ACT AERS respimat Inhale 1 puff into the lungs every 6 (six) hours as needed for wheezing.    Yes Historical Provider, MD  PRESCRIPTION MEDICATION every 30 (thirty) days. Shot in eye every month at Dr. Zigmund Daniel office   Yes Historical Provider, MD  rosuvastatin (CRESTOR) 20 MG tablet Take 20 mg by mouth at bedtime.   Yes Historical Provider, MD  Tetrahydrozoline HCl (EYE DROPS OP) Place 4 drops into the left eye over 48 hr.  Uses after she get shot in eye for 2 days   Yes Historical Provider, MD  Umeclidinium-Vilanterol 62.5-25 MCG/INH AEPB Inhale 1 puff into the lungs daily. 04/12/15  Yes Juanito Doom, MD  predniSONE (DELTASONE) 10 MG tablet Take  4 each am x 2 days,   2 each am x 2 days,  1 each am x 2 days and  stop Patient not taking: Reported on 06/10/2015 06/01/15   Tanda Rockers, MD   No Known Allergies  FAMILY HISTORY:  family history includes Diabetes in her sister, sister, sister, and sister; Heart disease in her mother; Hypertension in her mother. SOCIAL HISTORY:  reports that she quit smoking about 20 years ago. Her smoking use included Cigarettes. She has a 70 pack-year smoking history. She has never used smokeless tobacco. She reports that she does not drink alcohol or use illicit drugs.  REVIEW OF SYSTEMS:  += pos Constitutional: Negative for fever, chills, weight loss, +malaise/fatigue and diaphoresis.  HENT: Negative for hearing loss, ear pain, nosebleeds, congestion, sore throat, neck pain, tinnitus and ear discharge.   Eyes: Negative for blurred vision, double vision, photophobia, pain, discharge and redness.  Respiratory: Negative for +cough, hemoptysis, +sputum production, +shortness of breath, wheezing and stridor.   Cardiovascular: Negative for chest pain, palpitations, orthopnea, claudication, leg swelling and PND.  Gastrointestinal: Negative for heartburn, nausea, vomiting, abdominal pain, diarrhea, constipation, blood in stool and melena.  Genitourinary: Negative for dysuria, urgency, frequency, hematuria and flank pain.  Musculoskeletal: Negative for myalgias, back pain, joint pain and falls.  Skin: Negative for itching and rash.  Neurological: Negative for dizziness, tingling, tremors, sensory change, speech change, focal weakness, seizures, loss of consciousness, weakness and headaches.  Endo/Heme/Allergies: Negative for environmental allergies and polydipsia. Does not bruise/bleed easily.  SUBJECTIVE:   VITAL SIGNS: Temp:  [99 F (37.2 C)-102.1 F (38.9 C)] 99 F (37.2 C) (08/20 1424) Pulse Rate:  [47-96] 73 (08/20 1424) Resp:  [18-23] 18 (08/20 1424) BP: (111-143)/(61-82) 123/61 mmHg (08/20 1424) SpO2:  [88 %-98 %] 98 % (08/20 1424) Weight:  [81.647 kg (180 lb)]  81.647 kg (180 lb) (08/19 2308)  PHYSICAL EXAMINATION: General:  Alert, NAD elderly F in bed, pleasant,  Neuro: grossly intact but cites amnestic for events of fall HEENT:  Extensive facial bruising, nasal O2, clear speech, grossly normal hearing/ vision Cardiovascular: RRR, no m/g/r, no jvd, no peripheral edema Lungs:  Coarse, unlabored, no wheeze or cough, Minimal dullness R base w/o rub Abdomen:  Non-tender, BS present Musculoskeletal: normal muscle mass, moving all extremities, SCDs on legs Skin: no rash   Recent Labs Lab 06/11/15 0014  NA 132*  K 4.3  CL 87*  CO2 32  BUN 46*  CREATININE 1.12*  GLUCOSE 296*    Recent Labs Lab 06/11/15 0014  HGB 11.6*  HCT 36.5  WBC 19.4*  PLT 279   Dg Chest 1 View  06/11/2015   CLINICAL DATA:  Fall with head injury.  EXAM: CHEST  1 VIEW  COMPARISON:  Chest x-ray from earlier today  FINDINGS: Coarsened lower lung markings and streaky basilar opacities as previously seen. Patient has emphysema based on June 2016 chest CT. Small right pleural effusion is less apparent than prior, likely positional. No evidence of fracture or pneumothorax. Normal heart size and mediastinal contours.  IMPRESSION: No change from earlier today to suggest acute injury.   Electronically Signed   By: Monte Fantasia M.D.   On: 06/11/2015 00:28   Dg Chest 1  View  06/10/2015   CLINICAL DATA:  Status post right thoracentesis.  EXAM: CHEST  1 VIEW  COMPARISON:  06/01/2015  FINDINGS: The E cardiac silhouette, mediastinal and hilar contours are within normal limits and stable. There is tortuosity and calcification of the thoracic aorta. The right-sided pleural effusion is smaller. No postprocedural pneumothorax identified. The left lung is clear. Rounded nodular density in the left lower thorax is likely a nipple shadow.  IMPRESSION: Interval decrease in size of the right pleural effusion. No postprocedural pneumothorax.   Electronically Signed   By: Marijo Sanes M.D.   On:  06/10/2015 14:47   Ct Head Wo Contrast  06/11/2015   CLINICAL DATA:  Unwitnessed fall on way to bathroom, landing on face. Abrasions to head and nose. Dizziness and weakness, neck pain and headache. History of hypertension and hyperlipidemia.  EXAM: CT HEAD WITHOUT CONTRAST  CT MAXILLOFACIAL WITHOUT CONTRAST  CT CERVICAL SPINE WITHOUT CONTRAST  TECHNIQUE: Multidetector CT imaging of the head, cervical spine, and maxillofacial structures were performed using the standard protocol without intravenous contrast. Multiplanar CT image reconstructions of the cervical spine and maxillofacial structures were also generated.  COMPARISON:  None.  FINDINGS: CT HEAD FINDINGS  The ventricles and sulci are normal for age. No intraparenchymal hemorrhage, mass effect nor midline shift. Confluent supratentorial white matter hypodensities are within normal range for patient's age and though non-specific suggest sequelae of chronic small vessel ischemic disease. No acute large vascular territory infarcts.  No abnormal extra-axial fluid collections. Basal cisterns are patent. Mild calcific atherosclerosis of the carotid siphons. No skull fracture. Moderate RIGHT frontal scalp hematoma.  CT MAXILLOFACIAL FINDINGS  RIGHT orbital floor blow-out fracture with 2-3 mm depressed bony fragments, no external of orbital contents. Slight irregularity of the RIGHT lamina papyracea equivocal for nondisplaced fracture. No additional facial fractures. RIGHT maxillary superior antral mucosal thickening with small air-fluid level consistent with blood products. RIGHT concha bullosa. Mild ethmoid mucosal thickening. Mastoid air cells are well aerated. No destructive bony lesions. Patient is edentulous.  Ocular globes intact, status post bilateral ocular lens implants. Normal appearance optic nerve sheath complexes. No retrobulbar hematoma. Extraocular muscles are normal in appearance. Larger RIGHT facial subcutaneous hematoma with fat stranding, no  subcutaneous gas or radiopaque foreign bodies.  CT CERVICAL SPINE FINDINGS  Cervical vertebral bodies and posterior elements intact and aligned with maintenance of cervical lordosis. Moderate to severe C5-6, severe C6-7 disc height loss, vacuum disc, uncovertebral hypertrophy and marginal spurring consistent with degenerative disc. Severe RIGHT C5-6 and moderate bilateral C6-7 neural foraminal narrowing. C1-2 articulation maintained. No destructive bony lesions. 12 mm RIGHT thyroid nodule, below size followup recommendations. No prevertebral soft tissue swelling.  IMPRESSION: CT HEAD: Moderate RIGHT frontal scalp hematoma.  No skull fracture.  No acute intracranial process.  Severe white matter changes compatible chronic small vessel ischemic disease. Involutional changes.  CT MAXILLOFACIAL: Acute mildly displaced RIGHT orbital floor blowout fracture, no external herniation of orbital contents. Possible nondisplaced RIGHT lamina papyracea fracture. No postseptal hematoma.  RIGHT maxillary hemo sinus.  CT CERVICAL SPINE: No acute fracture or malalignment.   Electronically Signed   By: Elon Alas M.D.   On: 06/11/2015 01:02   Ct Cervical Spine Wo Contrast  06/11/2015   CLINICAL DATA:  Unwitnessed fall on way to bathroom, landing on face. Abrasions to head and nose. Dizziness and weakness, neck pain and headache. History of hypertension and hyperlipidemia.  EXAM: CT HEAD WITHOUT CONTRAST  CT MAXILLOFACIAL WITHOUT CONTRAST  CT  CERVICAL SPINE WITHOUT CONTRAST  TECHNIQUE: Multidetector CT imaging of the head, cervical spine, and maxillofacial structures were performed using the standard protocol without intravenous contrast. Multiplanar CT image reconstructions of the cervical spine and maxillofacial structures were also generated.  COMPARISON:  None.  FINDINGS: CT HEAD FINDINGS  The ventricles and sulci are normal for age. No intraparenchymal hemorrhage, mass effect nor midline shift. Confluent supratentorial  white matter hypodensities are within normal range for patient's age and though non-specific suggest sequelae of chronic small vessel ischemic disease. No acute large vascular territory infarcts.  No abnormal extra-axial fluid collections. Basal cisterns are patent. Mild calcific atherosclerosis of the carotid siphons. No skull fracture. Moderate RIGHT frontal scalp hematoma.  CT MAXILLOFACIAL FINDINGS  RIGHT orbital floor blow-out fracture with 2-3 mm depressed bony fragments, no external of orbital contents. Slight irregularity of the RIGHT lamina papyracea equivocal for nondisplaced fracture. No additional facial fractures. RIGHT maxillary superior antral mucosal thickening with small air-fluid level consistent with blood products. RIGHT concha bullosa. Mild ethmoid mucosal thickening. Mastoid air cells are well aerated. No destructive bony lesions. Patient is edentulous.  Ocular globes intact, status post bilateral ocular lens implants. Normal appearance optic nerve sheath complexes. No retrobulbar hematoma. Extraocular muscles are normal in appearance. Larger RIGHT facial subcutaneous hematoma with fat stranding, no subcutaneous gas or radiopaque foreign bodies.  CT CERVICAL SPINE FINDINGS  Cervical vertebral bodies and posterior elements intact and aligned with maintenance of cervical lordosis. Moderate to severe C5-6, severe C6-7 disc height loss, vacuum disc, uncovertebral hypertrophy and marginal spurring consistent with degenerative disc. Severe RIGHT C5-6 and moderate bilateral C6-7 neural foraminal narrowing. C1-2 articulation maintained. No destructive bony lesions. 12 mm RIGHT thyroid nodule, below size followup recommendations. No prevertebral soft tissue swelling.  IMPRESSION: CT HEAD: Moderate RIGHT frontal scalp hematoma.  No skull fracture.  No acute intracranial process.  Severe white matter changes compatible chronic small vessel ischemic disease. Involutional changes.  CT MAXILLOFACIAL: Acute  mildly displaced RIGHT orbital floor blowout fracture, no external herniation of orbital contents. Possible nondisplaced RIGHT lamina papyracea fracture. No postseptal hematoma.  RIGHT maxillary hemo sinus.  CT CERVICAL SPINE: No acute fracture or malalignment.   Electronically Signed   By: Elon Alas M.D.   On: 06/11/2015 01:02   Ct Maxillofacial Wo Cm  06/11/2015   CLINICAL DATA:  Unwitnessed fall on way to bathroom, landing on face. Abrasions to head and nose. Dizziness and weakness, neck pain and headache. History of hypertension and hyperlipidemia.  EXAM: CT HEAD WITHOUT CONTRAST  CT MAXILLOFACIAL WITHOUT CONTRAST  CT CERVICAL SPINE WITHOUT CONTRAST  TECHNIQUE: Multidetector CT imaging of the head, cervical spine, and maxillofacial structures were performed using the standard protocol without intravenous contrast. Multiplanar CT image reconstructions of the cervical spine and maxillofacial structures were also generated.  COMPARISON:  None.  FINDINGS: CT HEAD FINDINGS  The ventricles and sulci are normal for age. No intraparenchymal hemorrhage, mass effect nor midline shift. Confluent supratentorial white matter hypodensities are within normal range for patient's age and though non-specific suggest sequelae of chronic small vessel ischemic disease. No acute large vascular territory infarcts.  No abnormal extra-axial fluid collections. Basal cisterns are patent. Mild calcific atherosclerosis of the carotid siphons. No skull fracture. Moderate RIGHT frontal scalp hematoma.  CT MAXILLOFACIAL FINDINGS  RIGHT orbital floor blow-out fracture with 2-3 mm depressed bony fragments, no external of orbital contents. Slight irregularity of the RIGHT lamina papyracea equivocal for nondisplaced fracture. No additional facial fractures. RIGHT maxillary  superior antral mucosal thickening with small air-fluid level consistent with blood products. RIGHT concha bullosa. Mild ethmoid mucosal thickening. Mastoid air cells  are well aerated. No destructive bony lesions. Patient is edentulous.  Ocular globes intact, status post bilateral ocular lens implants. Normal appearance optic nerve sheath complexes. No retrobulbar hematoma. Extraocular muscles are normal in appearance. Larger RIGHT facial subcutaneous hematoma with fat stranding, no subcutaneous gas or radiopaque foreign bodies.  CT CERVICAL SPINE FINDINGS  Cervical vertebral bodies and posterior elements intact and aligned with maintenance of cervical lordosis. Moderate to severe C5-6, severe C6-7 disc height loss, vacuum disc, uncovertebral hypertrophy and marginal spurring consistent with degenerative disc. Severe RIGHT C5-6 and moderate bilateral C6-7 neural foraminal narrowing. C1-2 articulation maintained. No destructive bony lesions. 12 mm RIGHT thyroid nodule, below size followup recommendations. No prevertebral soft tissue swelling.  IMPRESSION: CT HEAD: Moderate RIGHT frontal scalp hematoma.  No skull fracture.  No acute intracranial process.  Severe white matter changes compatible chronic small vessel ischemic disease. Involutional changes.  CT MAXILLOFACIAL: Acute mildly displaced RIGHT orbital floor blowout fracture, no external herniation of orbital contents. Possible nondisplaced RIGHT lamina papyracea fracture. No postseptal hematoma.  RIGHT maxillary hemo sinus.  CT CERVICAL SPINE: No acute fracture or malalignment.   Electronically Signed   By: Elon Alas M.D.   On: 06/11/2015 01:02   US Thoracentesis Asp Pleural Space W/img Guide  06/10/2015   INDICATION: Symptomatic R sided pleural effusion  EXAM: US THORACENTESIS ASP PLEURAL SPACE W/IMG GUIDE  COMPARISON:  Previous thora  MEDICATIONS: 10 cc 1% lidocaine  COMPLICATIONS: None immediate  TECHNIQUE: Informed written consent was obtained from the patient after a discussion of the risks, benefits and alternatives to treatment. A timeout was performed prior to the initiation of the procedure.  Initial  ultrasound scanning demonstrates a right pleural effusion. The lower chest was prepped and draped in the usual sterile fashion. 1% lidocaine was used for local anesthesia.  Under direct ultrasound guidance, a 19 gauge, 7-cm, Yueh catheter was introduced. An ultrasound image was saved for documentation purposes. The thoracentesis was performed. The catheter was removed and a dressing was applied. The patient tolerated the procedure well without immediate post procedural complication. The patient was escorted to have an upright chest radiograph.  FINDINGS: A total of approximately 500 cc of yellow fluid was removed. Requested samples were sent to the laboratory.  IMPRESSION: Successful ultrasound-guided R sided thoracentesis yielding 500 cc of pleural fluid.  Read by:  Lavonia Drafts [facility]   Electronically Signed   By: Marybelle Killings M.D.   On: 06/10/2015 14:51   CXRs and CT chest review- nothing to add to radiology reports  ASSESSMENT / PLAN:  1) Recurrent R exudative pleural effusion. No infection identified. Suggestion that there may be response to steroids, indicating inflammatory process. In a former smoker, if cytopath and culture from 8/19 tap are negative, then consider VATS thoracoscopy.  2) COPD, GOLD III, O2 dependent. Had liked Anoro. Family asks we order ABG because she reportedly had CO2 retention in [place] when acutely ill. Agree with continuing steroids, nebs and Anoro.   3) Aortic atherosclerosis on CT. She denies heart disease and heart size is normal, but we can assume some degree of CAD.   CD Annamaria Boots, MD Pulmonary and [place] Pager: [phone]  06/11/2015, 3:48 PM

## 2015-06-11 NOTE — Progress Notes (Signed)
Patient has not voided since I/O cath at [place]. Bladder scanner revealed 101mL of urine. Patient in no distress. Will continue to monitor.  Barbee Shropshire. Brigitte Pulse, RN

## 2015-06-11 NOTE — ED Notes (Signed)
Per Dr. Truman Hayward, cancel the hip x-ray.

## 2015-06-11 NOTE — Progress Notes (Signed)
PROGRESS NOTE  Emily Owen CHE:[id number] DOB: 27-Aug-1930 DOA: 06/10/2015 PCP: Delphina Cahill, MD  HPI/Recap of past 24 hours:  Drowsy,  Assessment/Plan: Principal Problem:   Fall Active Problems:   Hyperlipidemia   COPD, moderate   Pleural effusion on right   HCAP (healthcare-associated pneumonia)   Atherosclerosis of aorta  Fall: reported possible incidental, reported progressive weakness in the last few days. Fall precaution, pt/ot, likely need rehab  UTI? Did have fever when presented to [facility], denies urinary symptom, urine culture pending, continue levaquin for now  Leukocytosis: recent on steroids, but did have increased productive cough and possible uti, continue abx  Copd/chronic hypoxic respiratory failure/recurrent right sided pleural effusion: continue oxgyen, nebs, steroids, pulm input appreciated  NIDDM2, a1c pending, not taking any meds at home, ssi here  Orbital floor fracture (right), ENT input appreciated  Code Status: DNR , verified with patient and family  Family Communication: patient and daughter  Disposition Plan: remain inpatient   Consultants:  ENT  pulmonology  Procedures:  none  Antibiotics:  levaquin   Objective: BP 123/61 mmHg  Pulse 73  Temp(Src) 99 F (37.2 C) (Oral)  Resp 18  Ht 5\' 5"  (1.651 m)  Wt 175 lb 8 oz (79.606 kg)  BMI 29.20 kg/m2  SpO2 98%  Intake/Output Summary (Last 24 hours) at 06/11/15 1751 Last data filed at 06/11/15 1500  Gross per 24 hour  Intake    390 ml  Output      0 ml  Net    390 ml   Filed Weights   06/10/15 2308 06/11/15 1717  Weight: 180 lb (81.647 kg) 175 lb 8 oz (79.606 kg)    Exam:   General:  Frail, drowsy, oriented x3, right periorbital edema/bruising  Cardiovascular: RRR  Respiratory: bibasilar crackles, clears with cough, no wheezing,  No rhonci  Abdomen: Soft/ND/NT, positive BS  Musculoskeletal: No Edema  Neuro: drowsy, but oriented x3  Data  Reviewed: Basic Metabolic Panel:  Recent Labs Lab 06/11/15 0014  NA 132*  K 4.3  CL 87*  CO2 32  GLUCOSE 296*  BUN 46*  CREATININE 1.12*  CALCIUM 8.7*   Liver Function Tests:  Recent Labs Lab 06/11/15 0014  AST 19  ALT 15  ALKPHOS 77  BILITOT 1.5*  PROT 7.0  ALBUMIN 3.4*   No results for input(s): LIPASE, AMYLASE in the last 168 hours. No results for input(s): AMMONIA in the last 168 hours. CBC:  Recent Labs Lab 06/11/15 0014  WBC 19.4*  NEUTROABS 16.1*  HGB 11.6*  HCT 36.5  MCV 84.1  PLT 279   Cardiac Enzymes:   No results for input(s): CKTOTAL, CKMB, CKMBINDEX, TROPONINI in the last 168 hours. BNP (last 3 results) No results for input(s): BNP in the last 8760 hours.  ProBNP (last 3 results)  Recent Labs  08/11/14 1307  PROBNP 106.5    CBG:  Recent Labs Lab 06/11/15 1202 06/11/15 1625  GLUCAP 235* 246*    Recent Results (from the past 240 hour(s))  Culture, body fluid-bottle     Status: None (Preliminary result)   Collection Time: 06/10/15  2:19 PM  Result Value Ref Range Status   Specimen Description FLUID RIGHT PLEURAL  Final   Special Requests BOTTLES DRAWN AEROBIC AND ANAEROBIC 10CC  Final   Culture   Final    NO GROWTH < 24 HOURS Performed at [facility]    Report Status PENDING  Incomplete  Gram stain  Status: None   Collection Time: 06/10/15  2:19 PM  Result Value Ref Range Status   Specimen Description FLUID RIGHT PLEURAL  Final   Special Requests NONE  Final   Gram Stain   Final    CYTOSPIN SMEAR WBC PRESENT,BOTH PMN AND MONONUCLEAR NO ORGANISMS SEEN Performed at [facility]    Report Status 06/10/2015 FINAL  Final  Blood culture (routine x 2)     Status: None (Preliminary result)   Collection Time: 06/11/15 12:14 AM  Result Value Ref Range Status   Specimen Description RIGHT ANTECUBITAL  Final   Special Requests BOTTLES DRAWN AEROBIC AND ANAEROBIC 6CC  Final   Culture NO GROWTH < 12 HOURS  Final    Report Status PENDING  Incomplete  Blood culture (routine x 2)     Status: None (Preliminary result)   Collection Time: 06/11/15 12:25 AM  Result Value Ref Range Status   Specimen Description BLOOD RIGHT HAND  Final   Special Requests BOTTLES DRAWN AEROBIC ONLY 6CC  Final   Culture NO GROWTH < 12 HOURS  Final   Report Status PENDING  Incomplete  MRSA PCR Screening     Status: None   Collection Time: 06/11/15 12:12 PM  Result Value Ref Range Status   MRSA by PCR NEGATIVE NEGATIVE Final    Comment:        The GeneXpert MRSA Assay (FDA approved for NASAL specimens only), is one component of a comprehensive MRSA colonization surveillance program. It is not intended to diagnose MRSA infection nor to guide or monitor treatment for MRSA infections.   Culture, expectorated sputum-assessment     Status: None   Collection Time: 06/11/15  5:00 PM  Result Value Ref Range Status   Specimen Description SPUTUM  Final   Special Requests NONE  Final   Sputum evaluation   Final    THIS SPECIMEN IS ACCEPTABLE. RESPIRATORY CULTURE REPORT TO FOLLOW.   Report Status 06/11/2015 FINAL  Final     Studies: Dg Chest 1 View  06/11/2015   CLINICAL DATA:  Fall with head injury.  EXAM: CHEST  1 VIEW  COMPARISON:  Chest x-ray from earlier today  FINDINGS: Coarsened lower lung markings and streaky basilar opacities as previously seen. Patient has emphysema based on June 2016 chest CT. Small right pleural effusion is less apparent than prior, likely positional. No evidence of fracture or pneumothorax. Normal heart size and mediastinal contours.  IMPRESSION: No change from earlier today to suggest acute injury.   Electronically Signed   By: Monte Fantasia M.D.   On: 06/11/2015 00:28   Ct Head Wo Contrast  06/11/2015   CLINICAL DATA:  Unwitnessed fall on way to bathroom, landing on face. Abrasions to head and nose. Dizziness and weakness, neck pain and headache. History of hypertension and hyperlipidemia.  EXAM:  CT HEAD WITHOUT CONTRAST  CT MAXILLOFACIAL WITHOUT CONTRAST  CT CERVICAL SPINE WITHOUT CONTRAST  TECHNIQUE: Multidetector CT imaging of the head, cervical spine, and maxillofacial structures were performed using the standard protocol without intravenous contrast. Multiplanar CT image reconstructions of the cervical spine and maxillofacial structures were also generated.  COMPARISON:  None.  FINDINGS: CT HEAD FINDINGS  The ventricles and sulci are normal for age. No intraparenchymal hemorrhage, mass effect nor midline shift. Confluent supratentorial white matter hypodensities are within normal range for patient's age and though non-specific suggest sequelae of chronic small vessel ischemic disease. No acute large vascular territory infarcts.  No abnormal extra-axial fluid collections.  Basal cisterns are patent. Mild calcific atherosclerosis of the carotid siphons. No skull fracture. Moderate RIGHT frontal scalp hematoma.  CT MAXILLOFACIAL FINDINGS  RIGHT orbital floor blow-out fracture with 2-3 mm depressed bony fragments, no external of orbital contents. Slight irregularity of the RIGHT lamina papyracea equivocal for nondisplaced fracture. No additional facial fractures. RIGHT maxillary superior antral mucosal thickening with small air-fluid level consistent with blood products. RIGHT concha bullosa. Mild ethmoid mucosal thickening. Mastoid air cells are well aerated. No destructive bony lesions. Patient is edentulous.  Ocular globes intact, status post bilateral ocular lens implants. Normal appearance optic nerve sheath complexes. No retrobulbar hematoma. Extraocular muscles are normal in appearance. Larger RIGHT facial subcutaneous hematoma with fat stranding, no subcutaneous gas or radiopaque foreign bodies.  CT CERVICAL SPINE FINDINGS  Cervical vertebral bodies and posterior elements intact and aligned with maintenance of cervical lordosis. Moderate to severe C5-6, severe C6-7 disc height loss, vacuum disc,  uncovertebral hypertrophy and marginal spurring consistent with degenerative disc. Severe RIGHT C5-6 and moderate bilateral C6-7 neural foraminal narrowing. C1-2 articulation maintained. No destructive bony lesions. 12 mm RIGHT thyroid nodule, below size followup recommendations. No prevertebral soft tissue swelling.  IMPRESSION: CT HEAD: Moderate RIGHT frontal scalp hematoma.  No skull fracture.  No acute intracranial process.  Severe white matter changes compatible chronic small vessel ischemic disease. Involutional changes.  CT MAXILLOFACIAL: Acute mildly displaced RIGHT orbital floor blowout fracture, no external herniation of orbital contents. Possible nondisplaced RIGHT lamina papyracea fracture. No postseptal hematoma.  RIGHT maxillary hemo sinus.  CT CERVICAL SPINE: No acute fracture or malalignment.   Electronically Signed   By: Elon Alas M.D.   On: 06/11/2015 01:02   Ct Cervical Spine Wo Contrast  06/11/2015   CLINICAL DATA:  Unwitnessed fall on way to bathroom, landing on face. Abrasions to head and nose. Dizziness and weakness, neck pain and headache. History of hypertension and hyperlipidemia.  EXAM: CT HEAD WITHOUT CONTRAST  CT MAXILLOFACIAL WITHOUT CONTRAST  CT CERVICAL SPINE WITHOUT CONTRAST  TECHNIQUE: Multidetector CT imaging of the head, cervical spine, and maxillofacial structures were performed using the standard protocol without intravenous contrast. Multiplanar CT image reconstructions of the cervical spine and maxillofacial structures were also generated.  COMPARISON:  None.  FINDINGS: CT HEAD FINDINGS  The ventricles and sulci are normal for age. No intraparenchymal hemorrhage, mass effect nor midline shift. Confluent supratentorial white matter hypodensities are within normal range for patient's age and though non-specific suggest sequelae of chronic small vessel ischemic disease. No acute large vascular territory infarcts.  No abnormal extra-axial fluid collections. Basal  cisterns are patent. Mild calcific atherosclerosis of the carotid siphons. No skull fracture. Moderate RIGHT frontal scalp hematoma.  CT MAXILLOFACIAL FINDINGS  RIGHT orbital floor blow-out fracture with 2-3 mm depressed bony fragments, no external of orbital contents. Slight irregularity of the RIGHT lamina papyracea equivocal for nondisplaced fracture. No additional facial fractures. RIGHT maxillary superior antral mucosal thickening with small air-fluid level consistent with blood products. RIGHT concha bullosa. Mild ethmoid mucosal thickening. Mastoid air cells are well aerated. No destructive bony lesions. Patient is edentulous.  Ocular globes intact, status post bilateral ocular lens implants. Normal appearance optic nerve sheath complexes. No retrobulbar hematoma. Extraocular muscles are normal in appearance. Larger RIGHT facial subcutaneous hematoma with fat stranding, no subcutaneous gas or radiopaque foreign bodies.  CT CERVICAL SPINE FINDINGS  Cervical vertebral bodies and posterior elements intact and aligned with maintenance of cervical lordosis. Moderate to severe C5-6, severe C6-7 disc height  loss, vacuum disc, uncovertebral hypertrophy and marginal spurring consistent with degenerative disc. Severe RIGHT C5-6 and moderate bilateral C6-7 neural foraminal narrowing. C1-2 articulation maintained. No destructive bony lesions. 12 mm RIGHT thyroid nodule, below size followup recommendations. No prevertebral soft tissue swelling.  IMPRESSION: CT HEAD: Moderate RIGHT frontal scalp hematoma.  No skull fracture.  No acute intracranial process.  Severe white matter changes compatible chronic small vessel ischemic disease. Involutional changes.  CT MAXILLOFACIAL: Acute mildly displaced RIGHT orbital floor blowout fracture, no external herniation of orbital contents. Possible nondisplaced RIGHT lamina papyracea fracture. No postseptal hematoma.  RIGHT maxillary hemo sinus.  CT CERVICAL SPINE: No acute fracture or  malalignment.   Electronically Signed   By: Elon Alas M.D.   On: 06/11/2015 01:02   Ct Maxillofacial Wo Cm  06/11/2015   CLINICAL DATA:  Unwitnessed fall on way to bathroom, landing on face. Abrasions to head and nose. Dizziness and weakness, neck pain and headache. History of hypertension and hyperlipidemia.  EXAM: CT HEAD WITHOUT CONTRAST  CT MAXILLOFACIAL WITHOUT CONTRAST  CT CERVICAL SPINE WITHOUT CONTRAST  TECHNIQUE: Multidetector CT imaging of the head, cervical spine, and maxillofacial structures were performed using the standard protocol without intravenous contrast. Multiplanar CT image reconstructions of the cervical spine and maxillofacial structures were also generated.  COMPARISON:  None.  FINDINGS: CT HEAD FINDINGS  The ventricles and sulci are normal for age. No intraparenchymal hemorrhage, mass effect nor midline shift. Confluent supratentorial white matter hypodensities are within normal range for patient's age and though non-specific suggest sequelae of chronic small vessel ischemic disease. No acute large vascular territory infarcts.  No abnormal extra-axial fluid collections. Basal cisterns are patent. Mild calcific atherosclerosis of the carotid siphons. No skull fracture. Moderate RIGHT frontal scalp hematoma.  CT MAXILLOFACIAL FINDINGS  RIGHT orbital floor blow-out fracture with 2-3 mm depressed bony fragments, no external of orbital contents. Slight irregularity of the RIGHT lamina papyracea equivocal for nondisplaced fracture. No additional facial fractures. RIGHT maxillary superior antral mucosal thickening with small air-fluid level consistent with blood products. RIGHT concha bullosa. Mild ethmoid mucosal thickening. Mastoid air cells are well aerated. No destructive bony lesions. Patient is edentulous.  Ocular globes intact, status post bilateral ocular lens implants. Normal appearance optic nerve sheath complexes. No retrobulbar hematoma. Extraocular muscles are normal in  appearance. Larger RIGHT facial subcutaneous hematoma with fat stranding, no subcutaneous gas or radiopaque foreign bodies.  CT CERVICAL SPINE FINDINGS  Cervical vertebral bodies and posterior elements intact and aligned with maintenance of cervical lordosis. Moderate to severe C5-6, severe C6-7 disc height loss, vacuum disc, uncovertebral hypertrophy and marginal spurring consistent with degenerative disc. Severe RIGHT C5-6 and moderate bilateral C6-7 neural foraminal narrowing. C1-2 articulation maintained. No destructive bony lesions. 12 mm RIGHT thyroid nodule, below size followup recommendations. No prevertebral soft tissue swelling.  IMPRESSION: CT HEAD: Moderate RIGHT frontal scalp hematoma.  No skull fracture.  No acute intracranial process.  Severe white matter changes compatible chronic small vessel ischemic disease. Involutional changes.  CT MAXILLOFACIAL: Acute mildly displaced RIGHT orbital floor blowout fracture, no external herniation of orbital contents. Possible nondisplaced RIGHT lamina papyracea fracture. No postseptal hematoma.  RIGHT maxillary hemo sinus.  CT CERVICAL SPINE: No acute fracture or malalignment.   Electronically Signed   By: Elon Alas M.D.   On: 06/11/2015 01:02    Scheduled Meds: . aspirin EC  81 mg Oral Daily  . guaiFENesin  600 mg Oral BID  . insulin aspart  0-15 Units  Subcutaneous TID WC  . ipratropium-albuterol  3 mL Nebulization 3 times per day  . levofloxacin (LEVAQUIN) IV  750 mg Intravenous Q24H  . omega-3 acid ethyl esters  2 g Oral BID  . predniSONE  40 mg Oral QAC breakfast  . rosuvastatin  20 mg Oral QHS  . sodium chloride  3 mL Intravenous Q12H  . Umeclidinium-Vilanterol  1 puff Inhalation Daily    Continuous Infusions:    Time spent: 83mins  Eual Lindstrom MD, PhD  [facility] Pager [phone]. If 7PM-7AM, please contact night-coverage at [email or web address], password [facility] 06/11/2015, 5:51 PM  LOS: 0 days
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
## 2015-06-11 NOTE — Consult Note (Signed)
Reason for Consult: facial fractures Referring Physician: Dr. Annamaria Boots Location: [facility] - inpatient Date: 06/11/15  Emily Owen is an 81 y.o. female.  HPI: Patient transferred from [facility] last evening after fall at home trying to go to bathroom. Plastic Surgery consulted for facial fractures.   Patient with productive cough, 1 day post thoracentesis for recurrent effusion, on home O2 Wears glasses for reading, has macular degeneration on left, bilateral cataract extraction and IOL. Denies pain with movement eye, double vision. Does not have reading glasses present.   Past Medical History  Diagnosis Date  . COPD (chronic obstructive pulmonary disease)   . Osteoporosis   . Cancer     colon  . Cancer of cervix     cervix  . Gout   . Gallstones   . Hard of hearing   . Pleural effusion     Past Surgical History  Procedure Laterality Date  . Abdominal hysterectomy    . Orthopedic surgery    . Colon surgery    . Gallstone surgery      stones removed but still has gallbladder    Family History  Problem Relation Age of Onset  . Hypertension Mother   . Heart disease Mother   . Diabetes Sister   . Diabetes Sister   . Diabetes Sister   . Diabetes Sister     Social History:  reports that she quit smoking about 20 years ago. Her smoking use included Cigarettes. She has a 70 pack-year smoking history. She has never used smokeless tobacco. She reports that she does not drink alcohol or use illicit drugs.  Allergies: No Known Allergies  Medications: I have reviewed the patient's current medications.  Results for orders placed or performed during the hospital encounter of 06/10/15 (from the past 48 hour(s))  CBC with Differential/Platelet     Status: Abnormal   Collection Time: 06/11/15 12:14 AM  Result Value Ref Range   WBC 19.4 (H) 4.0 - 10.5 K/uL   RBC 4.34 3.87 - 5.11 MIL/uL   Hemoglobin 11.6 (L) 12.0 - 15.0 g/dL   HCT 36.5 36.0 - 46.0 %   MCV 84.1 78.0 - 100.0 fL   MCH  26.7 26.0 - 34.0 pg   MCHC 31.8 30.0 - 36.0 g/dL   RDW 14.8 11.5 - 15.5 %   Platelets 279 150 - 400 K/uL   Neutrophils Relative % 84 (H) 43 - 77 %   Neutro Abs 16.1 (H) 1.7 - 7.7 K/uL   Lymphocytes Relative 7 (L) 12 - 46 %   Lymphs Abs 1.4 0.7 - 4.0 K/uL   Monocytes Relative 9 3 - 12 %   Monocytes Absolute 1.8 (H) 0.1 - 1.0 K/uL   Eosinophils Relative 0 0 - 5 %   Eosinophils Absolute 0.0 0.0 - 0.7 K/uL   Basophils Relative 0 0 - 1 %   Basophils Absolute 0.0 0.0 - 0.1 K/uL  Comprehensive metabolic panel     Status: Abnormal   Collection Time: 06/11/15 12:14 AM  Result Value Ref Range   Sodium 132 (L) 135 - 145 mmol/L   Potassium 4.3 3.5 - 5.1 mmol/L   Chloride 87 (L) 101 - 111 mmol/L   CO2 32 22 - 32 mmol/L   Glucose, Bld 296 (H) 65 - 99 mg/dL   BUN 46 (H) 6 - 20 mg/dL   Creatinine, Ser 1.12 (H) 0.44 - 1.00 mg/dL   Calcium 8.7 (L) 8.9 - 10.3 mg/dL   Total Protein 7.0  6.5 - 8.1 g/dL   Albumin 3.4 (L) 3.5 - 5.0 g/dL   AST 19 15 - 41 U/L   ALT 15 14 - 54 U/L   Alkaline Phosphatase 77 38 - 126 U/L   Total Bilirubin 1.5 (H) 0.3 - 1.2 mg/dL   GFR calc non Af Amer 44 (L) >60 mL/min   GFR calc Af Amer 52 (L) >60 mL/min    Comment: (NOTE) The eGFR has been calculated using the CKD EPI equation. This calculation has not been validated in all clinical situations. eGFR's persistently <60 mL/min signify possible Chronic Kidney Disease.    Anion gap 13 5 - 15  Blood culture (routine x 2)     Status: None (Preliminary result)   Collection Time: 06/11/15 12:14 AM  Result Value Ref Range   Specimen Description RIGHT ANTECUBITAL    Special Requests BOTTLES DRAWN AEROBIC AND ANAEROBIC 6CC    Culture NO GROWTH < 12 HOURS    Report Status PENDING   Lactic acid, plasma     Status: None   Collection Time: 06/11/15 12:14 AM  Result Value Ref Range   Lactic Acid, Venous 1.6 0.5 - 2.0 mmol/L  Urinalysis, Routine w reflex microscopic (not at [facility])     Status: Abnormal   Collection Time:  06/11/15 12:14 AM  Result Value Ref Range   Color, Urine YELLOW YELLOW   APPearance CLEAR CLEAR   Specific Gravity, Urine >1.030 (H) 1.005 - 1.030   pH 5.5 5.0 - 8.0   Glucose, UA NEGATIVE NEGATIVE mg/dL   Hgb urine dipstick TRACE (A) NEGATIVE   Bilirubin Urine SMALL (A) NEGATIVE   Ketones, ur NEGATIVE NEGATIVE mg/dL   Protein, ur 100 (A) NEGATIVE mg/dL   Urobilinogen, UA 1.0 0.0 - 1.0 mg/dL   Nitrite NEGATIVE NEGATIVE   Leukocytes, UA NEGATIVE NEGATIVE  Urine microscopic-add on     Status: Abnormal   Collection Time: 06/11/15 12:14 AM  Result Value Ref Range   Squamous Epithelial / LPF FEW (A) RARE   WBC, UA 3-6 <3 WBC/hpf   RBC / HPF 3-6 <3 RBC/hpf   Bacteria, UA MANY (A) RARE  Blood culture (routine x 2)     Status: None (Preliminary result)   Collection Time: 06/11/15 12:25 AM  Result Value Ref Range   Specimen Description BLOOD RIGHT HAND    Special Requests BOTTLES DRAWN AEROBIC ONLY 6CC    Culture NO GROWTH < 12 HOURS    Report Status PENDING   Lactic acid, plasma     Status: None   Collection Time: 06/11/15  2:20 AM  Result Value Ref Range   Lactic Acid, Venous 1.5 0.5 - 2.0 mmol/L  Glucose, capillary     Status: Abnormal   Collection Time: 06/11/15 12:02 PM  Result Value Ref Range   Glucose-Capillary 235 (H) 65 - 99 mg/dL  MRSA PCR Screening     Status: None   Collection Time: 06/11/15 12:12 PM  Result Value Ref Range   MRSA by PCR NEGATIVE NEGATIVE    Comment:        The GeneXpert MRSA Assay (FDA approved for NASAL specimens only), is one component of a comprehensive MRSA colonization surveillance program. It is not intended to diagnose MRSA infection nor to guide or monitor treatment for MRSA infections.   Glucose, capillary     Status: Abnormal   Collection Time: 06/11/15  4:25 PM  Result Value Ref Range   Glucose-Capillary 246 (H) 65 - 99 mg/dL  Ct Maxillofacial Wo Cm  06/11/2015   CLINICAL DATA:  Unwitnessed fall on way to bathroom, landing on  face. Abrasions to head and nose. Dizziness and weakness, neck pain and headache. History of hypertension and hyperlipidemia.  EXAM: CT HEAD WITHOUT CONTRAST  CT MAXILLOFACIAL WITHOUT CONTRAST  CT CERVICAL SPINE WITHOUT CONTRAST  TECHNIQUE: Multidetector CT imaging of the head, cervical spine, and maxillofacial structures were performed using the standard protocol without intravenous contrast. Multiplanar CT image reconstructions of the cervical spine and maxillofacial structures were also generated.  COMPARISON:  None.  FINDINGS: CT HEAD FINDINGS  The ventricles and sulci are normal for age. No intraparenchymal hemorrhage, mass effect nor midline shift. Confluent supratentorial white matter hypodensities are within normal range for patient's age and though non-specific suggest sequelae of chronic small vessel ischemic disease. No acute large vascular territory infarcts.  No abnormal extra-axial fluid collections. Basal cisterns are patent. Mild calcific atherosclerosis of the carotid siphons. No skull fracture. Moderate RIGHT frontal scalp hematoma.  CT MAXILLOFACIAL FINDINGS  RIGHT orbital floor blow-out fracture with 2-3 mm depressed bony fragments, no external of orbital contents. Slight irregularity of the RIGHT lamina papyracea equivocal for nondisplaced fracture. No additional facial fractures. RIGHT maxillary superior antral mucosal thickening with small air-fluid level consistent with blood products. RIGHT concha bullosa. Mild ethmoid mucosal thickening. Mastoid air cells are well aerated. No destructive bony lesions. Patient is edentulous.  Ocular globes intact, status post bilateral ocular lens implants. Normal appearance optic nerve sheath complexes. No retrobulbar hematoma. Extraocular muscles are normal in appearance. Larger RIGHT facial subcutaneous hematoma with fat stranding, no subcutaneous gas or radiopaque foreign bodies.  CT CERVICAL SPINE FINDINGS  Cervical vertebral bodies and posterior  elements intact and aligned with maintenance of cervical lordosis. Moderate to severe C5-6, severe C6-7 disc height loss, vacuum disc, uncovertebral hypertrophy and marginal spurring consistent with degenerative disc. Severe RIGHT C5-6 and moderate bilateral C6-7 neural foraminal narrowing. C1-2 articulation maintained. No destructive bony lesions. 12 mm RIGHT thyroid nodule, below size followup recommendations. No prevertebral soft tissue swelling.  IMPRESSION: CT HEAD: Moderate RIGHT frontal scalp hematoma.  No skull fracture.  No acute intracranial process.  Severe white matter changes compatible chronic small vessel ischemic disease. Involutional changes.  CT MAXILLOFACIAL: Acute mildly displaced RIGHT orbital floor blowout fracture, no external herniation of orbital contents. Possible nondisplaced RIGHT lamina papyracea fracture. No postseptal hematoma.  RIGHT maxillary hemo sinus.  CT CERVICAL SPINE: No acute fracture or malalignment.   Electronically Signed   By: Elon Alas M.D.   On: 06/11/2015 01:02    ROS Blood pressure 123/61, pulse 73, temperature 99 F (37.2 C), temperature source Oral, resp. rate 18, weight 81.647 kg (180 lb), SpO2 98 %. Physical Exam Alert NAD Ecchymoses right cheek and periorbital with 1 mm lagophthalmos due to cheek swelling EOMI, pupils 4 to 2 mm bilateral With hand held Snellen, vision 20/50 without glasses, using both eyes Numbness over right cheek, Edentulous  Assessment/Plan: CT personally reviewed. Right maxillary sinus fracture, right orbital floor fracture with some depression, clinically no visual changes, no entrapment. No need for surgery. Rec dilated eye exam with her ophthalmologist as OP. Counseled will be numb over right cheek for for several weeks due to neurapraxia infraorbital nerve. May use ophthalmic lubrican while sleeping as has some lagophthalmos presently from edema. Should expect edema to worsen over additional 24 hours, will have  bloody expectorate or nasal drainage for week.   Irene Limbo, MD [facility] [phone]

## 2015-06-11 NOTE — ED Notes (Signed)
Dr. Truman Hayward at bedside speaking with patient and family. Family is concerned about patient care. Dr. Truman Hayward said that he would transfer the patient to [facility] where the patient has been taken care of for her pleural effusion. Family aware.

## 2015-06-11 NOTE — Progress Notes (Signed)
ANTIBIOTIC CONSULT NOTE-Preliminary  Pharmacy Consult for vancomycin Indication: pneumonia  No Known Allergies  Patient Measurements: Weight: 180 lb (81.647 kg)   Vital Signs: Temp: 102.1 F (38.9 C) (08/20 0016) Temp Source: Rectal (08/20 0016) BP: 135/68 mmHg (08/20 0130) Pulse Rate: 93 (08/20 0130)  Labs:  Recent Labs  06/11/15 0014  WBC 19.4*  HGB 11.6*  PLT 279  CREATININE 1.12*    Estimated Creatinine Clearance: 40 mL/min (by C-G formula based on Cr of 1.12).  No results for input(s): VANCOTROUGH, VANCOPEAK, VANCORANDOM, GENTTROUGH, GENTPEAK, GENTRANDOM, TOBRATROUGH, TOBRAPEAK, TOBRARND, AMIKACINPEAK, AMIKACINTROU, AMIKACIN in the last 72 hours.   Microbiology: Recent Results (from the past 720 hour(s))  Gram stain     Status: None   Collection Time: 06/10/15  2:19 PM  Result Value Ref Range Status   Specimen Description FLUID RIGHT PLEURAL  Final   Special Requests NONE  Final   Gram Stain   Final    CYTOSPIN SMEAR WBC PRESENT,BOTH PMN AND MONONUCLEAR NO ORGANISMS SEEN Performed at [facility]    Report Status 06/10/2015 FINAL  Final  Blood culture (routine x 2)     Status: None (Preliminary result)   Collection Time: 06/11/15 12:14 AM  Result Value Ref Range Status   Specimen Description RIGHT ANTECUBITAL  Final   Special Requests BOTTLES DRAWN AEROBIC AND ANAEROBIC 6CC  Final   Culture PENDING  Incomplete   Report Status PENDING  Incomplete  Blood culture (routine x 2)     Status: None (Preliminary result)   Collection Time: 06/11/15 12:25 AM  Result Value Ref Range Status   Specimen Description BLOOD RIGHT HAND  Final   Special Requests BOTTLES DRAWN AEROBIC ONLY [place]  Final   Culture PENDING  Incomplete   Report Status PENDING  Incomplete    Medical History: Past Medical History  Diagnosis Date  . COPD (chronic obstructive pulmonary disease)   . Osteoporosis   . Cancer     colon  . Cancer of cervix     cervix  . Gout   .  Gallstones   . Hard of hearing   . Pleural effusion     Medications:  Scheduled:  .  morphine injection  4 mg Intravenous Once    Assessment: 81 yo female with hx COPD and pleural effusion who fell at home hitting head. Pt had labored breathing, productive cough. WBC 19.4.   Goal of Therapy:  Vancomycin trough level 15-20 mcg/ml  Plan:  Preliminary review of pertinent patient information completed.  Protocol will be initiated with a one-time dose(s) of vancomycin 1000 mg VI x 1.  Forestine Na clinical pharmacist will complete review during morning rounds to assess patient and finalize treatment regimen.  Deadrian Toya Scarlett, RPH 06/11/2015,1:56 AM

## 2015-06-11 NOTE — Progress Notes (Signed)
Patient unable to void. Bladder scanner revealed 1109mL of urine. Patient having poor po intake. MD notified. Orders for IV fluid. Will continue to monitor and pass along to next shift.  Barbee Shropshire. Brigitte Pulse, RN

## 2015-06-12 LAB — COMPREHENSIVE METABOLIC PANEL
ALBUMIN: 2.9 g/dL — AB (ref 3.5–5.0)
ALK PHOS: 68 U/L (ref 38–126)
ALT: 15 U/L (ref 14–54)
ANION GAP: 11 (ref 5–15)
AST: 22 U/L (ref 15–41)
BILIRUBIN TOTAL: 0.4 mg/dL (ref 0.3–1.2)
BUN: 64 mg/dL — AB (ref 6–20)
CO2: 31 mmol/L (ref 22–32)
Calcium: 8.4 mg/dL — ABNORMAL LOW (ref 8.9–10.3)
Chloride: 88 mmol/L — ABNORMAL LOW (ref 101–111)
Creatinine, Ser: 1.57 mg/dL — ABNORMAL HIGH (ref 0.44–1.00)
GFR calc Af Amer: 34 mL/min — ABNORMAL LOW (ref >60)
GFR calc non Af Amer: 30 mL/min — ABNORMAL LOW (ref >60)
GLUCOSE: 236 mg/dL — AB (ref 65–99)
POTASSIUM: 4.5 mmol/L (ref 3.5–5.1)
SODIUM: 130 mmol/L — AB (ref 135–145)
TOTAL PROTEIN: 6.1 g/dL — AB (ref 6.5–8.1)

## 2015-06-12 LAB — CBC WITH DIFFERENTIAL/PLATELET
BASOS ABS: 0 10*3/uL (ref 0.0–0.1)
BASOS PCT: 0 % (ref 0–1)
EOS ABS: 0 10*3/uL (ref 0.0–0.7)
Eosinophils Relative: 0 % (ref 0–5)
HEMATOCRIT: 28.1 % — AB (ref 36.0–46.0)
HEMOGLOBIN: 8.9 g/dL — AB (ref 12.0–15.0)
Lymphocytes Relative: 8 % — ABNORMAL LOW (ref 12–46)
Lymphs Abs: 0.9 10*3/uL (ref 0.7–4.0)
MCH: 26 pg (ref 26.0–34.0)
MCHC: 31.7 g/dL (ref 30.0–36.0)
MCV: 82.2 fL (ref 78.0–100.0)
Monocytes Absolute: 1 10*3/uL (ref 0.1–1.0)
Monocytes Relative: 9 % (ref 3–12)
NEUTROS ABS: 8.7 10*3/uL — AB (ref 1.7–7.7)
NEUTROS PCT: 83 % — AB (ref 43–77)
Platelets: 236 10*3/uL (ref 150–400)
RBC: 3.42 MIL/uL — ABNORMAL LOW (ref 3.87–5.11)
RDW: 14.7 % (ref 11.5–15.5)
WBC: 10.6 10*3/uL — ABNORMAL HIGH (ref 4.0–10.5)

## 2015-06-12 LAB — GLUCOSE, CAPILLARY
GLUCOSE-CAPILLARY: 177 mg/dL — AB (ref 65–99)
GLUCOSE-CAPILLARY: 302 mg/dL — AB (ref 65–99)
Glucose-Capillary: 256 mg/dL — ABNORMAL HIGH (ref 65–99)
Glucose-Capillary: 313 mg/dL — ABNORMAL HIGH (ref 65–99)

## 2015-06-12 LAB — MAGNESIUM: Magnesium: 1.7 mg/dL (ref 1.7–2.4)

## 2015-06-12 LAB — LACTIC ACID, PLASMA: Lactic Acid, Venous: 1.9 mmol/L (ref 0.5–2.0)

## 2015-06-12 MED ORDER — SENNOSIDES-DOCUSATE SODIUM 8.6-50 MG PO TABS
1.0000 | ORAL_TABLET | Freq: Two times a day (BID) | ORAL | Status: DC
  Administered 2015-06-12 – 2015-06-14 (×5): 1 via ORAL
  Filled 2015-06-12 (×5): qty 1

## 2015-06-12 MED ORDER — INSULIN GLARGINE 100 UNIT/ML ~~LOC~~ SOLN
10.0000 [IU] | Freq: Every day | SUBCUTANEOUS | Status: DC
  Administered 2015-06-12 – 2015-06-13 (×2): 10 [IU] via SUBCUTANEOUS
  Filled 2015-06-12 (×3): qty 0.1

## 2015-06-12 NOTE — Plan of Care (Signed)
Problem: Phase I Progression Outcomes Goal: Pain controlled with appropriate interventions Outcome: Progressing Pt denies pain at this time.      

## 2015-06-12 NOTE — Progress Notes (Signed)
Name: Emily Owen MRN: [id number] DOB: [birth year]    ADMISSION DATE:  06/10/2015 CONSULTATION DATE:  06/11/15  REFERRING MD :  TRH  CHIEF COMPLAINT:  Pleural effusion  BRIEF PATIENT DESCRIPTION: 16 yoF former smoker with GOLD III COPD, O2 dependent (DrMcQuaid/ Dr Melvyn Novas) admitted after fall w R orbital blow-out fx. Malaise since April. Recurrent exudative R pleural effusion tapped 03/2015 and again 06/10/15.  Prednisone may have helped suppress effusion, unconfirmed.  SIGNIFICANT EVENTS  Fall, blow-out fx R orbit 8/19  STUDIES:  Thoracentesis 6/1-sterile exudate Thoracentesis 8/19- exudate   HISTORY OF PRESENT ILLNESS:  70 yoF former smoker with COPD, O2 dep 3L/ [employer] (GOLD III, FEV1 44%) and past hx hypercapnea. Fell at home,(? tripped on O2 cord) and suffered scalp hematoma, bruise to back, R orbital blow-out fx, R hemosinus.  Noted malaise since April while living in [place]. Recently moved here. Evaluated by [facility] PULM for exudative R pleural effusion initially tapped 6/1. Seemed to improve with pred taper and Anoro. Seen again 8/10 by MW noting reaccumulation of effusion. Given pred taper. Doxycycline called in. Second R thoracentesis 8/19-exudate,1618 WBC, cytopath and cultures pending. There has been some recent cough yellow green, and maybe low grade temp. No bleeding, adenopathy or other acute concern.WBC 8/19 19,400- possibly from recent steroids as taper from 8/10 has just ended. CT chest 6/3- emphysema, pneumobilia, thyroid nodule, small R effusion, aortic atherosclerosis. CXR p tap 8/20- coarse markings, small R effusion. Now on levaquin, prednisone, Anoro and nebs.    SUBJECTIVE: Feeling better. Back hurts where bruised with cough.  VITAL SIGNS: Temp:  [97.5 F (36.4 C)-99 F (37.2 C)] 98.2 F (36.8 C) (08/21 0427) Pulse Rate:  [69-73] 70 (08/21 0427) Resp:  [16-18] 16 (08/21 0427) BP: (99-123)/(47-61) 99/47 mmHg (08/21 0427) SpO2:  [96 %-98 %] 96 % (08/21  0821) Weight:  [79.606 kg (175 lb 8 oz)] 79.606 kg (175 lb 8 oz) (08/20 1717)  PHYSICAL EXAMINATION: General:  Alert, NAD elderly F in bed, pleasant,  Neuro: grossly intact but cites amnestic for events of fall HEENT:  Extensive facial bruising, nasal O2, clear speech, grossly normal hearing/ vision Cardiovascular: RRR, no m/g/r, no jvd, no peripheral edema Lungs:  Coarse, unlabored, no wheeze or cough, Minimal dullness R base w/o rub Abdomen:  Non-tender, BS present Musculoskeletal: normal muscle mass, moving all extremities, SCDs on legs Skin: no rash   Recent Labs Lab 06/11/15 0014 06/12/15 0633  NA 132* 130*  K 4.3 4.5  CL 87* 88*  CO2 32 31  BUN 46* 64*  CREATININE 1.12* 1.57*  GLUCOSE 296* 236*    Recent Labs Lab 06/11/15 0014 06/12/15 0633  HGB 11.6* 8.9*  HCT 36.5 28.1*  WBC 19.4* 10.6*  PLT 279 236   Dg Chest 1 View  06/11/2015   CLINICAL DATA:  Fall with head injury.  EXAM: CHEST  1 VIEW  COMPARISON:  Chest x-ray from earlier today  FINDINGS: Coarsened lower lung markings and streaky basilar opacities as previously seen. Patient has emphysema based on June 2016 chest CT. Small right pleural effusion is less apparent than prior, likely positional. No evidence of fracture or pneumothorax. Normal heart size and mediastinal contours.  IMPRESSION: No change from earlier today to suggest acute injury.   Electronically Signed   By: Monte Fantasia M.D.   On: 06/11/2015 00:28   Dg Chest 1 View  06/10/2015   CLINICAL DATA:  Status post right thoracentesis.  EXAM: CHEST  1 VIEW  COMPARISON:  06/01/2015  FINDINGS: The E cardiac silhouette, mediastinal and hilar contours are within normal limits and stable. There is tortuosity and calcification of the thoracic aorta. The right-sided pleural effusion is smaller. No postprocedural pneumothorax identified. The left lung is clear. Rounded nodular density in the left lower thorax is likely a nipple shadow.  IMPRESSION: Interval  decrease in size of the right pleural effusion. No postprocedural pneumothorax.   Electronically Signed   By: Marijo Sanes M.D.   On: 06/10/2015 14:47   Ct Head Wo Contrast  06/11/2015   CLINICAL DATA:  Unwitnessed fall on way to bathroom, landing on face. Abrasions to head and nose. Dizziness and weakness, neck pain and headache. History of hypertension and hyperlipidemia.  EXAM: CT HEAD WITHOUT CONTRAST  CT MAXILLOFACIAL WITHOUT CONTRAST  CT CERVICAL SPINE WITHOUT CONTRAST  TECHNIQUE: Multidetector CT imaging of the head, cervical spine, and maxillofacial structures were performed using the standard protocol without intravenous contrast. Multiplanar CT image reconstructions of the cervical spine and maxillofacial structures were also generated.  COMPARISON:  None.  FINDINGS: CT HEAD FINDINGS  The ventricles and sulci are normal for age. No intraparenchymal hemorrhage, mass effect nor midline shift. Confluent supratentorial white matter hypodensities are within normal range for patient's age and though non-specific suggest sequelae of chronic small vessel ischemic disease. No acute large vascular territory infarcts.  No abnormal extra-axial fluid collections. Basal cisterns are patent. Mild calcific atherosclerosis of the carotid siphons. No skull fracture. Moderate RIGHT frontal scalp hematoma.  CT MAXILLOFACIAL FINDINGS  RIGHT orbital floor blow-out fracture with 2-3 mm depressed bony fragments, no external of orbital contents. Slight irregularity of the RIGHT lamina papyracea equivocal for nondisplaced fracture. No additional facial fractures. RIGHT maxillary superior antral mucosal thickening with small air-fluid level consistent with blood products. RIGHT concha bullosa. Mild ethmoid mucosal thickening. Mastoid air cells are well aerated. No destructive bony lesions. Patient is edentulous.  Ocular globes intact, status post bilateral ocular lens implants. Normal appearance optic nerve sheath complexes. No  retrobulbar hematoma. Extraocular muscles are normal in appearance. Larger RIGHT facial subcutaneous hematoma with fat stranding, no subcutaneous gas or radiopaque foreign bodies.  CT CERVICAL SPINE FINDINGS  Cervical vertebral bodies and posterior elements intact and aligned with maintenance of cervical lordosis. Moderate to severe C5-6, severe C6-7 disc height loss, vacuum disc, uncovertebral hypertrophy and marginal spurring consistent with degenerative disc. Severe RIGHT C5-6 and moderate bilateral C6-7 neural foraminal narrowing. C1-2 articulation maintained. No destructive bony lesions. 12 mm RIGHT thyroid nodule, below size followup recommendations. No prevertebral soft tissue swelling.  IMPRESSION: CT HEAD: Moderate RIGHT frontal scalp hematoma.  No skull fracture.  No acute intracranial process.  Severe white matter changes compatible chronic small vessel ischemic disease. Involutional changes.  CT MAXILLOFACIAL: Acute mildly displaced RIGHT orbital floor blowout fracture, no external herniation of orbital contents. Possible nondisplaced RIGHT lamina papyracea fracture. No postseptal hematoma.  RIGHT maxillary hemo sinus.  CT CERVICAL SPINE: No acute fracture or malalignment.   Electronically Signed   By: Elon Alas M.D.   On: 06/11/2015 01:02   Ct Cervical Spine Wo Contrast  06/11/2015   CLINICAL DATA:  Unwitnessed fall on way to bathroom, landing on face. Abrasions to head and nose. Dizziness and weakness, neck pain and headache. History of hypertension and hyperlipidemia.  EXAM: CT HEAD WITHOUT CONTRAST  CT MAXILLOFACIAL WITHOUT CONTRAST  CT CERVICAL SPINE WITHOUT CONTRAST  TECHNIQUE: Multidetector CT imaging of the head, cervical spine, and maxillofacial structures were performed  using the standard protocol without intravenous contrast. Multiplanar CT image reconstructions of the cervical spine and maxillofacial structures were also generated.  COMPARISON:  None.  FINDINGS: CT HEAD FINDINGS   The ventricles and sulci are normal for age. No intraparenchymal hemorrhage, mass effect nor midline shift. Confluent supratentorial white matter hypodensities are within normal range for patient's age and though non-specific suggest sequelae of chronic small vessel ischemic disease. No acute large vascular territory infarcts.  No abnormal extra-axial fluid collections. Basal cisterns are patent. Mild calcific atherosclerosis of the carotid siphons. No skull fracture. Moderate RIGHT frontal scalp hematoma.  CT MAXILLOFACIAL FINDINGS  RIGHT orbital floor blow-out fracture with 2-3 mm depressed bony fragments, no external of orbital contents. Slight irregularity of the RIGHT lamina papyracea equivocal for nondisplaced fracture. No additional facial fractures. RIGHT maxillary superior antral mucosal thickening with small air-fluid level consistent with blood products. RIGHT concha bullosa. Mild ethmoid mucosal thickening. Mastoid air cells are well aerated. No destructive bony lesions. Patient is edentulous.  Ocular globes intact, status post bilateral ocular lens implants. Normal appearance optic nerve sheath complexes. No retrobulbar hematoma. Extraocular muscles are normal in appearance. Larger RIGHT facial subcutaneous hematoma with fat stranding, no subcutaneous gas or radiopaque foreign bodies.  CT CERVICAL SPINE FINDINGS  Cervical vertebral bodies and posterior elements intact and aligned with maintenance of cervical lordosis. Moderate to severe C5-6, severe C6-7 disc height loss, vacuum disc, uncovertebral hypertrophy and marginal spurring consistent with degenerative disc. Severe RIGHT C5-6 and moderate bilateral C6-7 neural foraminal narrowing. C1-2 articulation maintained. No destructive bony lesions. 12 mm RIGHT thyroid nodule, below size followup recommendations. No prevertebral soft tissue swelling.  IMPRESSION: CT HEAD: Moderate RIGHT frontal scalp hematoma.  No skull fracture.  No acute intracranial  process.  Severe white matter changes compatible chronic small vessel ischemic disease. Involutional changes.  CT MAXILLOFACIAL: Acute mildly displaced RIGHT orbital floor blowout fracture, no external herniation of orbital contents. Possible nondisplaced RIGHT lamina papyracea fracture. No postseptal hematoma.  RIGHT maxillary hemo sinus.  CT CERVICAL SPINE: No acute fracture or malalignment.   Electronically Signed   By: Elon Alas M.D.   On: 06/11/2015 01:02   Ct Maxillofacial Wo Cm  06/11/2015   CLINICAL DATA:  Unwitnessed fall on way to bathroom, landing on face. Abrasions to head and nose. Dizziness and weakness, neck pain and headache. History of hypertension and hyperlipidemia.  EXAM: CT HEAD WITHOUT CONTRAST  CT MAXILLOFACIAL WITHOUT CONTRAST  CT CERVICAL SPINE WITHOUT CONTRAST  TECHNIQUE: Multidetector CT imaging of the head, cervical spine, and maxillofacial structures were performed using the standard protocol without intravenous contrast. Multiplanar CT image reconstructions of the cervical spine and maxillofacial structures were also generated.  COMPARISON:  None.  FINDINGS: CT HEAD FINDINGS  The ventricles and sulci are normal for age. No intraparenchymal hemorrhage, mass effect nor midline shift. Confluent supratentorial white matter hypodensities are within normal range for patient's age and though non-specific suggest sequelae of chronic small vessel ischemic disease. No acute large vascular territory infarcts.  No abnormal extra-axial fluid collections. Basal cisterns are patent. Mild calcific atherosclerosis of the carotid siphons. No skull fracture. Moderate RIGHT frontal scalp hematoma.  CT MAXILLOFACIAL FINDINGS  RIGHT orbital floor blow-out fracture with 2-3 mm depressed bony fragments, no external of orbital contents. Slight irregularity of the RIGHT lamina papyracea equivocal for nondisplaced fracture. No additional facial fractures. RIGHT maxillary superior antral mucosal  thickening with small air-fluid level consistent with blood products. RIGHT concha bullosa. Mild ethmoid mucosal  thickening. Mastoid air cells are well aerated. No destructive bony lesions. Patient is edentulous.  Ocular globes intact, status post bilateral ocular lens implants. Normal appearance optic nerve sheath complexes. No retrobulbar hematoma. Extraocular muscles are normal in appearance. Larger RIGHT facial subcutaneous hematoma with fat stranding, no subcutaneous gas or radiopaque foreign bodies.  CT CERVICAL SPINE FINDINGS  Cervical vertebral bodies and posterior elements intact and aligned with maintenance of cervical lordosis. Moderate to severe C5-6, severe C6-7 disc height loss, vacuum disc, uncovertebral hypertrophy and marginal spurring consistent with degenerative disc. Severe RIGHT C5-6 and moderate bilateral C6-7 neural foraminal narrowing. C1-2 articulation maintained. No destructive bony lesions. 12 mm RIGHT thyroid nodule, below size followup recommendations. No prevertebral soft tissue swelling.  IMPRESSION: CT HEAD: Moderate RIGHT frontal scalp hematoma.  No skull fracture.  No acute intracranial process.  Severe white matter changes compatible chronic small vessel ischemic disease. Involutional changes.  CT MAXILLOFACIAL: Acute mildly displaced RIGHT orbital floor blowout fracture, no external herniation of orbital contents. Possible nondisplaced RIGHT lamina papyracea fracture. No postseptal hematoma.  RIGHT maxillary hemo sinus.  CT CERVICAL SPINE: No acute fracture or malalignment.   Electronically Signed   By: Elon Alas M.D.   On: 06/11/2015 01:02   US Thoracentesis Asp Pleural Space W/img Guide  06/10/2015   INDICATION: Symptomatic R sided pleural effusion  EXAM: US THORACENTESIS ASP PLEURAL SPACE W/IMG GUIDE  COMPARISON:  Previous thora  MEDICATIONS: 10 cc 1% lidocaine  COMPLICATIONS: None immediate  TECHNIQUE: Informed written consent was obtained from the patient after a  discussion of the risks, benefits and alternatives to treatment. A timeout was performed prior to the initiation of the procedure.  Initial ultrasound scanning demonstrates a right pleural effusion. The lower chest was prepped and draped in the usual sterile fashion. 1% lidocaine was used for local anesthesia.  Under direct ultrasound guidance, a 19 gauge, 7-cm, Yueh catheter was introduced. An ultrasound image was saved for documentation purposes. The thoracentesis was performed. The catheter was removed and a dressing was applied. The patient tolerated the procedure well without immediate post procedural complication. The patient was escorted to have an upright chest radiograph.  FINDINGS: A total of approximately 500 cc of yellow fluid was removed. Requested samples were sent to the laboratory.  IMPRESSION: Successful ultrasound-guided R sided thoracentesis yielding 500 cc of pleural fluid.  Read by:  Lavonia Drafts [facility]   Electronically Signed   By: Marybelle Killings M.D.   On: 06/10/2015 14:51   CXRs and CT chest review- nothing to add to radiology reports  ASSESSMENT / PLAN:  1) Recurrent R exudative pleural effusion. No infection identified. Suggestion that there may be response to steroids, indicating inflammatory process. In a former smoker, if cytopath and culture from 8/19 tap are negative, then consider VATS thoracoscopy. Culture neg so far, cytopath pending Plan- downstairs 2V CXR 8/22  2) COPD, GOLD III, O2 dependent. Had liked Anoro.  Agree with continuing steroids, nebs and Anoro.   3) Aortic atherosclerosis on CT. She denies heart disease and heart size is normal, but we can assume some degree of CAD.   CD Annamaria Boots, MD Pulmonary and [place] Pager: [phone]  06/12/2015, 1:34 PM

## 2015-06-12 NOTE — Progress Notes (Signed)
RT completed ABG- however, [employer] (after multiple attempts) indicated that the Accession Number has not been defined. After two RT's attempted to correct- RT called Dr. Annamaria Boots to give results. She indicated that Dr. Glynn Octave ordered original ABG but was reordered under her name- she did not feel it was necessary to call Dr. Annamaria Boots with the results since they are acceptable to her. She did have Dr. Glynn Octave see this patient yesterday. MD is aware of results but is not reflected in [facility] at this time.

## 2015-06-12 NOTE — Progress Notes (Signed)
PROGRESS NOTE  Emily Owen KAJ:[id number] DOB: 12-27-1929 DOA: 06/10/2015 PCP: Delphina Cahill, MD  HPI/Recap of past 24 hours:  More alert today, daughter in room Had poor oral intake, and poor urine output, gentle hydration started yesterday No bmx3days  Assessment/Plan: Principal Problem:   Fall Active Problems:   Hyperlipidemia   COPD, moderate   Pleural effusion on right   HCAP (healthcare-associated pneumonia)   Atherosclerosis of aorta   Pyrexia   Diabetes mellitus type 2, noninsulin dependent  Fall: reported possible incidental, reported progressive weakness in the last few days. Fall precaution, pt/ot, likely need rehab  UTI? Did have fever when presented to [facility], denies urinary symptom, urine culture pending, continue levaquin for now  Leukocytosis:  recently on steroids, but did have increased productive cough and possible uti, continue abx Wbc trending down  Copd/chronic hypoxic respiratory failure/recurrent right sided pleural effusion:  continue oxgyen, nebs, steroids, pulm input appreciated  NIDDM2,  a1c pending, not taking any meds at home, ssi here 8/21, blood sugar remain elevated, likely from steroids, lantus 10units qhs started  Hyponatremia: likely combination of poor oral intake, and elevated blood sugar (corrected na131), correct blood sugar and gentle hydration  Orbital floor fracture (right), ENT input appreciated  Constipation: stool softener  Code Status: DNR , verified with patient and family  Family Communication: patient and daughter  Disposition Plan: remain inpatient   Consultants:  ENT  pulmonology  Procedures:  none  Antibiotics:  levaquin   Objective: BP 99/47 mmHg  Pulse 70  Temp(Src) 98.2 F (36.8 C) (Oral)  Resp 16  Ht 5\' 5"  (1.651 m)  Wt 175 lb 8 oz (79.606 kg)  BMI 29.20 kg/m2  SpO2 96%  Intake/Output Summary (Last 24 hours) at 06/12/15 1209 Last data filed at 06/12/15 0741  Gross per 24  hour  Intake 2096.25 ml  Output    800 ml  Net 1296.25 ml   Filed Weights   06/10/15 2308 06/11/15 1717  Weight: 180 lb (81.647 kg) 175 lb 8 oz (79.606 kg)    Exam:   General:  Frail, drowsy, oriented x3, right periorbital edema/bruising  Cardiovascular: RRR  Respiratory: bibasilar crackles, clears with cough, no wheezing,  No rhonci  Abdomen: Soft/ND/NT, positive BS  Musculoskeletal: No Edema  Neuro: drowsy, but oriented x3  Data Reviewed: Basic Metabolic Panel:  Recent Labs Lab 06/11/15 0014 06/12/15 0633  NA 132* 130*  K 4.3 4.5  CL 87* 88*  CO2 32 31  GLUCOSE 296* 236*  BUN 46* 64*  CREATININE 1.12* 1.57*  CALCIUM 8.7* 8.4*  MG  --  1.7   Liver Function Tests:  Recent Labs Lab 06/11/15 0014 06/12/15 0633  AST 19 22  ALT 15 15  ALKPHOS 77 68  BILITOT 1.5* 0.4  PROT 7.0 6.1*  ALBUMIN 3.4* 2.9*   No results for input(s): LIPASE, AMYLASE in the last 168 hours. No results for input(s): AMMONIA in the last 168 hours. CBC:  Recent Labs Lab 06/11/15 0014 06/12/15 0633  WBC 19.4* 10.6*  NEUTROABS 16.1* 8.7*  HGB 11.6* 8.9*  HCT 36.5 28.1*  MCV 84.1 82.2  PLT 279 236   Cardiac Enzymes:   No results for input(s): CKTOTAL, CKMB, CKMBINDEX, TROPONINI in the last 168 hours. BNP (last 3 results) No results for input(s): BNP in the last 8760 hours.  ProBNP (last 3 results)  Recent Labs  08/11/14 1307  PROBNP 106.5    CBG:  Recent Labs Lab 06/11/15 1202 06/11/15  1625 06/11/15 2058 06/12/15 0731 06/12/15 1143  GLUCAP 235* 246* 288* 177* 256*    Recent Results (from the past 240 hour(s))  Culture, body fluid-bottle     Status: None (Preliminary result)   Collection Time: 06/10/15  2:19 PM  Result Value Ref Range Status   Specimen Description FLUID RIGHT PLEURAL  Final   Special Requests BOTTLES DRAWN AEROBIC AND ANAEROBIC 10CC  Final   Culture   Final    NO GROWTH < 24 HOURS Performed at [facility]    Report Status  PENDING  Incomplete  Gram stain     Status: None   Collection Time: 06/10/15  2:19 PM  Result Value Ref Range Status   Specimen Description FLUID RIGHT PLEURAL  Final   Special Requests NONE  Final   Gram Stain   Final    CYTOSPIN SMEAR WBC PRESENT,BOTH PMN AND MONONUCLEAR NO ORGANISMS SEEN Performed at [facility]    Report Status 06/10/2015 FINAL  Final  Blood culture (routine x 2)     Status: None (Preliminary result)   Collection Time: 06/11/15 12:14 AM  Result Value Ref Range Status   Specimen Description RIGHT ANTECUBITAL  Final   Special Requests BOTTLES DRAWN AEROBIC AND ANAEROBIC 6CC  Final   Culture NO GROWTH 1 DAY  Final   Report Status PENDING  Incomplete  Blood culture (routine x 2)     Status: None (Preliminary result)   Collection Time: 06/11/15 12:25 AM  Result Value Ref Range Status   Specimen Description BLOOD RIGHT HAND  Final   Special Requests BOTTLES DRAWN AEROBIC ONLY 6CC  Final   Culture NO GROWTH 1 DAY  Final   Report Status PENDING  Incomplete  MRSA PCR Screening     Status: None   Collection Time: 06/11/15 12:12 PM  Result Value Ref Range Status   MRSA by PCR NEGATIVE NEGATIVE Final    Comment:        The GeneXpert MRSA Assay (FDA approved for NASAL specimens only), is one component of a comprehensive MRSA colonization surveillance program. It is not intended to diagnose MRSA infection nor to guide or monitor treatment for MRSA infections.   Culture, expectorated sputum-assessment     Status: None   Collection Time: 06/11/15  5:00 PM  Result Value Ref Range Status   Specimen Description SPUTUM  Final   Special Requests NONE  Final   Sputum evaluation   Final    THIS SPECIMEN IS ACCEPTABLE. RESPIRATORY CULTURE REPORT TO FOLLOW.   Report Status 06/11/2015 FINAL  Final     Studies: No results found.  Scheduled Meds: . aspirin EC  81 mg Oral Daily  . guaiFENesin  600 mg Oral BID  . insulin aspart  0-15 Units Subcutaneous TID WC    . insulin glargine  10 Units Subcutaneous QHS  . ipratropium-albuterol  3 mL Nebulization TID  . levofloxacin (LEVAQUIN) IV  750 mg Intravenous Q24H  . omega-3 acid ethyl esters  2 g Oral BID  . predniSONE  40 mg Oral QAC breakfast  . rosuvastatin  20 mg Oral QHS  . sodium chloride  3 mL Intravenous Q12H  . Umeclidinium-Vilanterol  1 puff Inhalation Daily    Continuous Infusions: . sodium chloride 75 mL/hr at 06/11/15 1907     Time spent: 17mins  Scot Shiraishi MD, PhD  [facility] Pager [phone]. If 7PM-7AM, please contact night-coverage at [email or web address], password [facility] 06/12/2015, 12:09 PM  LOS: 1 day

## 2015-06-12 NOTE — Progress Notes (Signed)
Utilization review completed.  

## 2015-06-13 ENCOUNTER — Inpatient Hospital Stay (HOSPITAL_COMMUNITY): Payer: Medicare Other

## 2015-06-13 DIAGNOSIS — J90 Pleural effusion, not elsewhere classified: Secondary | ICD-10-CM | POA: Insufficient documentation

## 2015-06-13 LAB — CBC WITH DIFFERENTIAL/PLATELET
BASOS ABS: 0 10*3/uL (ref 0.0–0.1)
Basophils Relative: 0 % (ref 0–1)
EOS ABS: 0 10*3/uL (ref 0.0–0.7)
EOS PCT: 0 % (ref 0–5)
HCT: 28.1 % — ABNORMAL LOW (ref 36.0–46.0)
Hemoglobin: 8.9 g/dL — ABNORMAL LOW (ref 12.0–15.0)
LYMPHS ABS: 1 10*3/uL (ref 0.7–4.0)
LYMPHS PCT: 9 % — AB (ref 12–46)
MCH: 26.1 pg (ref 26.0–34.0)
MCHC: 31.7 g/dL (ref 30.0–36.0)
MCV: 82.4 fL (ref 78.0–100.0)
MONO ABS: 1.1 10*3/uL — AB (ref 0.1–1.0)
Monocytes Relative: 10 % (ref 3–12)
Neutro Abs: 9.3 10*3/uL — ABNORMAL HIGH (ref 1.7–7.7)
Neutrophils Relative %: 81 % — ABNORMAL HIGH (ref 43–77)
PLATELETS: 296 10*3/uL (ref 150–400)
RBC: 3.41 MIL/uL — ABNORMAL LOW (ref 3.87–5.11)
RDW: 14.7 % (ref 11.5–15.5)
WBC: 11.4 10*3/uL — ABNORMAL HIGH (ref 4.0–10.5)

## 2015-06-13 LAB — GLUCOSE, CAPILLARY
GLUCOSE-CAPILLARY: 159 mg/dL — AB (ref 65–99)
GLUCOSE-CAPILLARY: 403 mg/dL — AB (ref 65–99)
Glucose-Capillary: 207 mg/dL — ABNORMAL HIGH (ref 65–99)
Glucose-Capillary: 237 mg/dL — ABNORMAL HIGH (ref 65–99)

## 2015-06-13 LAB — URINE CULTURE: CULTURE: NO GROWTH

## 2015-06-13 LAB — BASIC METABOLIC PANEL
Anion gap: 8 (ref 5–15)
BUN: 46 mg/dL — AB (ref 6–20)
CALCIUM: 8.7 mg/dL — AB (ref 8.9–10.3)
CHLORIDE: 94 mmol/L — AB (ref 101–111)
CO2: 35 mmol/L — ABNORMAL HIGH (ref 22–32)
CREATININE: 1.13 mg/dL — AB (ref 0.44–1.00)
GFR calc non Af Amer: 44 mL/min — ABNORMAL LOW (ref >60)
GFR, EST AFRICAN AMERICAN: 51 mL/min — AB (ref >60)
Glucose, Bld: 239 mg/dL — ABNORMAL HIGH (ref 65–99)
Potassium: 4.4 mmol/L (ref 3.5–5.1)
SODIUM: 137 mmol/L (ref 135–145)

## 2015-06-13 LAB — HEMOGLOBIN A1C
HEMOGLOBIN A1C: 7.6 % — AB (ref 4.8–5.6)
MEAN PLASMA GLUCOSE: 171 mg/dL

## 2015-06-13 LAB — MAGNESIUM: MAGNESIUM: 2 mg/dL (ref 1.7–2.4)

## 2015-06-13 MED ORDER — LEVOFLOXACIN IN D5W 750 MG/150ML IV SOLN: 750.0000 mg | INTRAVENOUS | Status: DC

## 2015-06-13 MED ORDER — PREDNISONE 20 MG PO TABS
20.0000 mg | ORAL_TABLET | Freq: Every day | ORAL | Status: DC
  Administered 2015-06-14: 20 mg via ORAL
  Filled 2015-06-13: qty 1

## 2015-06-13 MED ORDER — INSULIN ASPART 100 UNIT/ML ~~LOC~~ SOLN
20.0000 [IU] | Freq: Once | SUBCUTANEOUS | Status: AC
  Administered 2015-06-13: 20 [IU] via SUBCUTANEOUS

## 2015-06-13 MED ORDER — INSULIN ASPART 100 UNIT/ML ~~LOC~~ SOLN
3.0000 [IU] | Freq: Three times a day (TID) | SUBCUTANEOUS | Status: DC
  Administered 2015-06-13 – 2015-06-14 (×3): 3 [IU] via SUBCUTANEOUS

## 2015-06-13 MED ORDER — POLYETHYLENE GLYCOL 3350 17 G PO PACK
17.0000 g | PACK | Freq: Every day | ORAL | Status: DC
  Administered 2015-06-13 – 2015-06-14 (×2): 17 g via ORAL
  Filled 2015-06-13 (×2): qty 1

## 2015-06-13 NOTE — Progress Notes (Signed)
PROGRESS NOTE  Emily Owen GMW:[id number] DOB: Mar 21, 1930 DOA: 06/10/2015 PCP: Delphina Cahill, MD  HPI/Recap of past 24 hours:  Sitting in chair, reported dry cough, no bm for several days , overall feeling better, daughter in room   Assessment/Plan: Principal Problem:   Fall Active Problems:   Hyperlipidemia   COPD, moderate   Pleural effusion on right   HCAP (healthcare-associated pneumonia)   Atherosclerosis of aorta   Pyrexia   Diabetes mellitus type 2, noninsulin dependent  Fall: reported possible incidental, reported progressive weakness in the last few days. Fall precaution, pt/ot, patient declined rehab, prefer home health  UTI? Did have fever when presented to [facility], denies urinary symptom, urine culture pending, continue levaquin for now  Leukocytosis:  recently on steroids, but did have increased productive cough and possible uti, continue abx Wbc trending down  Copd/chronic hypoxic respiratory failure/recurrent right sided pleural effusion:  continue oxgyen, nebs, steroids, pulm input appreciated  NIDDM2,  a1c 7.6, not taking any meds at home, ssi here 8/21, blood sugar remain elevated, likely from steroids, lantus 10units qhs started Continue same regimen while taper off steroids.  Hyponatremia:  likely combination of poor oral intake, resolved after correct blood sugar and gentle hydration  Orbital floor fracture (right), ENT input appreciated  Constipation: stool softener  Code Status: DNR , verified with patient and family  Family Communication: patient and daughter  Disposition Plan: home with home health tomorrow if cleared by pulmonology   Consultants:  ENT  pulmonology  Procedures:  none  Antibiotics:  levaquin   Objective: BP 103/50 mmHg  Pulse 73  Temp(Src) 98.4 F (36.9 C) (Oral)  Resp 20  Ht 5\' 5"  (1.651 m)  Wt 175 lb 8 oz (79.606 kg)  BMI 29.20 kg/m2  SpO2 96%  Intake/Output Summary (Last 24 hours) at  06/13/15 1314 Last data filed at 06/13/15 1044  Gross per 24 hour  Intake 2821.25 ml  Output   1650 ml  Net 1171.25 ml   Filed Weights   06/10/15 2308 06/11/15 1717  Weight: 180 lb (81.647 kg) 175 lb 8 oz (79.606 kg)    Exam:   General:  Frail,  oriented x3, right periorbital edema/bruising  Cardiovascular: RRR  Respiratory: decreased right low lobe, no wheezing,  No rhonci  Abdomen: Soft/ND/NT, positive BS  Musculoskeletal: No Edema  Neuro: oriented x3  Data Reviewed: Basic Metabolic Panel:  Recent Labs Lab 06/11/15 0014 06/12/15 0633 06/13/15 0510  NA 132* 130* 137  K 4.3 4.5 4.4  CL 87* 88* 94*  CO2 32 31 35*  GLUCOSE 296* 236* 239*  BUN 46* 64* 46*  CREATININE 1.12* 1.57* 1.13*  CALCIUM 8.7* 8.4* 8.7*  MG  --  1.7 2.0   Liver Function Tests:  Recent Labs Lab 06/11/15 0014 06/12/15 0633  AST 19 22  ALT 15 15  ALKPHOS 77 68  BILITOT 1.5* 0.4  PROT 7.0 6.1*  ALBUMIN 3.4* 2.9*   No results for input(s): LIPASE, AMYLASE in the last 168 hours. No results for input(s): AMMONIA in the last 168 hours. CBC:  Recent Labs Lab 06/11/15 0014 06/12/15 0633 06/13/15 0510  WBC 19.4* 10.6* 11.4*  NEUTROABS 16.1* 8.7* 9.3*  HGB 11.6* 8.9* 8.9*  HCT 36.5 28.1* 28.1*  MCV 84.1 82.2 82.4  PLT 279 236 296   Cardiac Enzymes:   No results for input(s): CKTOTAL, CKMB, CKMBINDEX, TROPONINI in the last 168 hours. BNP (last 3 results) No results for input(s): BNP in the  last 8760 hours.  ProBNP (last 3 results)  Recent Labs  08/11/14 1307  PROBNP 106.5    CBG:  Recent Labs Lab 06/12/15 1143 06/12/15 1704 06/12/15 2056 06/13/15 0726 06/13/15 1138  GLUCAP 256* 302* 313* 207* 159*    Recent Results (from the past 240 hour(s))  Culture, body fluid-bottle     Status: None (Preliminary result)   Collection Time: 06/10/15  2:19 PM  Result Value Ref Range Status   Specimen Description FLUID RIGHT PLEURAL  Final   Special Requests BOTTLES DRAWN  AEROBIC AND ANAEROBIC 10CC  Final   Culture   Final    NO GROWTH 3 DAYS Performed at [facility]    Report Status PENDING  Incomplete  Gram stain     Status: None   Collection Time: 06/10/15  2:19 PM  Result Value Ref Range Status   Specimen Description FLUID RIGHT PLEURAL  Final   Special Requests NONE  Final   Gram Stain   Final    CYTOSPIN SMEAR WBC PRESENT,BOTH PMN AND MONONUCLEAR NO ORGANISMS SEEN Performed at [facility]    Report Status 06/10/2015 FINAL  Final  Blood culture (routine x 2)     Status: None (Preliminary result)   Collection Time: 06/11/15 12:14 AM  Result Value Ref Range Status   Specimen Description RIGHT ANTECUBITAL  Final   Special Requests BOTTLES DRAWN AEROBIC AND ANAEROBIC 6CC  Final   Culture NO GROWTH 2 DAYS  Final   Report Status PENDING  Incomplete  Urine culture     Status: None   Collection Time: 06/11/15 12:14 AM  Result Value Ref Range Status   Specimen Description URINE, CATHETERIZED  Final   Special Requests NONE  Final   Culture   Final    NO GROWTH 2 DAYS Performed at [facility]    Report Status 06/13/2015 FINAL  Final  Blood culture (routine x 2)     Status: None (Preliminary result)   Collection Time: 06/11/15 12:25 AM  Result Value Ref Range Status   Specimen Description BLOOD RIGHT HAND  Final   Special Requests BOTTLES DRAWN AEROBIC ONLY 6CC  Final   Culture NO GROWTH 2 DAYS  Final   Report Status PENDING  Incomplete  MRSA PCR Screening     Status: None   Collection Time: 06/11/15 12:12 PM  Result Value Ref Range Status   MRSA by PCR NEGATIVE NEGATIVE Final    Comment:        The GeneXpert MRSA Assay (FDA approved for NASAL specimens only), is one component of a comprehensive MRSA colonization surveillance program. It is not intended to diagnose MRSA infection nor to guide or monitor treatment for MRSA infections.   Culture, expectorated sputum-assessment     Status: None   Collection Time:  06/11/15  5:00 PM  Result Value Ref Range Status   Specimen Description SPUTUM  Final   Special Requests NONE  Final   Sputum evaluation   Final    THIS SPECIMEN IS ACCEPTABLE. RESPIRATORY CULTURE REPORT TO FOLLOW.   Report Status 06/11/2015 FINAL  Final  Culture, respiratory (NON-Expectorated)     Status: None (Preliminary result)   Collection Time: 06/11/15  5:00 PM  Result Value Ref Range Status   Specimen Description SPUTUM  Final   Special Requests NONE  Final   Gram Stain   Final    ABUNDANT WBC PRESENT, PREDOMINANTLY PMN FEW SQUAMOUS EPITHELIAL CELLS PRESENT FEW GRAM POSITIVE COCCI IN  PAIRS Performed at [facility]   Final    Culture reincubated for better growth Performed at [facility]    Report Status PENDING  Incomplete     Studies: Dg Chest 2 View  06/13/2015   CLINICAL DATA:  Pleural effusion.  Weakness.  EXAM: CHEST  2 VIEW  COMPARISON:  06/10/2015.  FINDINGS: Mediastinum and hilar structures are unremarkable. Mild infiltrate right mid lung field. Moderate size right pleural effusion. Heart size normal. No pneumothorax.  IMPRESSION: Mild infiltrate right mid lung with moderate-sized right pleural effusion.   Electronically Signed   By: Marcello Moores  Register   On: 06/13/2015 08:40    Scheduled Meds: . aspirin EC  81 mg Oral Daily  . guaiFENesin  600 mg Oral BID  . insulin aspart  0-15 Units Subcutaneous TID WC  . insulin aspart  3 Units Subcutaneous TID WC  . insulin glargine  10 Units Subcutaneous QHS  . ipratropium-albuterol  3 mL Nebulization TID  . [START ON 06/15/2015] levofloxacin (LEVAQUIN) IV  750 mg Intravenous Q48H  . omega-3 acid ethyl esters  2 g Oral BID  . [START ON 06/14/2015] predniSONE  20 mg Oral Q breakfast  . rosuvastatin  20 mg Oral QHS  . senna-docusate  1 tablet Oral BID  . sodium chloride  3 mL Intravenous Q12H  . Umeclidinium-Vilanterol  1 puff Inhalation Daily    Continuous Infusions:     Time spent:  77mins  Diana Davenport MD, PhD  [facility] Pager [phone]. If 7PM-7AM, please contact night-coverage at [email or web address], password [facility] 06/13/2015, 1:14 PM  LOS: 2 days

## 2015-06-13 NOTE — Evaluation (Addendum)
Physical Therapy Evaluation Patient Details Name: Emily Owen MRN: [id number] DOB: 1930-10-22 Today's Date: 06/13/2015   History of Present Illness  81 yo female adm s/p fall at home--> R orbital blow out fx, scalp hematoma; TDV:VOHYW CA, cervical CA, COPD on home O2  Clinical Impression  Pt admitted with above diagnosis. Pt currently with functional limitations due to the deficits listed below (see PT Problem List).  Pt will benefit from skilled PT to increase their independence and safety with mobility to allow discharge to the venue listed below.   Recommend pt use RW and have incr family support initially, pt in agreement at this time; Will benefit from HHPT, concerned about cost--will defer to CM     Follow Up Recommendations Home health PT    Equipment Recommendations  None recommended by PT    Recommendations for Other Services       Precautions / Restrictions Precautions Precautions: Fall Restrictions Weight Bearing Restrictions: No      Mobility  Bed Mobility               General bed mobility comments: EOB on arrival, NT in room  Transfers Overall transfer level: Needs assistance Equipment used: Rolling walker (2 wheeled) Transfers: Sit to/from Stand Sit to Stand: Min guard         General transfer comment: subtle cues for hand placement  Ambulation/Gait Ambulation/Gait assistance: Min assist Ambulation Distance (Feet): 100 Feet Assistive device: Rolling walker (2 wheeled) Gait Pattern/deviations: Trunk flexed;Decreased stride length;Drifts right/left Gait velocity: slow/decreased   General Gait Details: multi-modal cues and assist with RW control and direction  Stairs            Wheelchair Mobility    Modified Rankin (Stroke Patients Only)       Balance Overall balance assessment: History of Falls;Needs assistance           Standing balance-Leahy Scale: Fair Standing balance comment: dizzy with initial standing,  subsided; able to maintain static stand without UE support for ~30sec                             Pertinent Vitals/Pain Pain Assessment: 0-10 Pain Score: 3  Pain Location: back Pain Intervention(s): Limited activity within patient's tolerance;Monitored during session;Repositioned    Home Living Family/patient expects to be discharged to:: Private residence Living Arrangements: Children   Type of Home: House Home Access: Ramped entrance     Home Layout: One level Home Equipment: Environmental consultant - 2 wheels;Cane - quad Additional Comments: lives with dtr who is very supportive, dtr works, has another dtr that works at [employer]    Prior Function Level of Independence: Independent               Journalist, newspaper        Extremity/Trunk Assessment   Upper Extremity Assessment: Defer to OT evaluation           Lower Extremity Assessment: Generalized weakness         Communication   Communication: No difficulties  Cognition Arousal/Alertness: Awake/alert Behavior During Therapy: WFL for tasks assessed/performed Overall Cognitive Status: Within Functional Limits for tasks assessed                      General Comments      Exercises        Assessment/Plan    PT Assessment Patient needs continued PT services  PT Diagnosis Difficulty walking  PT Problem List Decreased strength;Decreased balance;Decreased mobility;Decreased activity tolerance  PT Treatment Interventions DME instruction;Gait training;Functional mobility training;Therapeutic activities;Patient/family education;Therapeutic exercise   PT Goals (Current goals can be found in the Care Plan section) Acute Rehab PT Goals Patient Stated Goal: be able to go home PT Goal Formulation: With patient Time For Goal Achievement: 06/20/15 Potential to Achieve Goals: Good    Frequency Min 3X/week   Barriers to discharge        Co-evaluation               End of Session Equipment Utilized  During Treatment: Gait belt Activity Tolerance: Patient tolerated treatment well Patient left: in chair;with call bell/phone within reach;with chair alarm set           Time: 9373-4287 PT Time Calculation (min) (ACUTE ONLY): 21 min   Charges:   PT Evaluation $Initial PT Evaluation Tier I: 1 Procedure     PT G CodesKenyon Ana 2015-06-24, 11:16 AM

## 2015-06-13 NOTE — Plan of Care (Signed)
Problem: Phase II Progression Outcomes Goal: Progress activity as tolerated unless otherwise ordered Outcome: Progressing Pt sat up in chair at bedside for breakfast this AM

## 2015-06-13 NOTE — Progress Notes (Signed)
Name: Emily Owen MRN: [id number] DOB: 1930/06/11    ADMISSION DATE:  06/10/2015 CONSULTATION DATE:  06/11/15  REFERRING MD :  TRH  CHIEF COMPLAINT:  Pleural effusion  BRIEF PATIENT DESCRIPTION: 81 y/o F former smoker with GOLD III COPD, O2 dependent (DrMcQuaid/ Dr Melvyn Novas) admitted after fall w R orbital blow-out fx. Malaise since April. Recurrent exudative R pleural effusion tapped 03/2015 and again 06/10/15.  Prednisone may have helped suppress effusion, unconfirmed.  SIGNIFICANT EVENTS  8/19  Admit after mechanical fall, blow-out fx R orbit 8/19.  Pleural effusions noted on CXR 8/20 R Thoracentesis >> 500 ml yellow fluid   STUDIES:  Thoracentesis 6/1>> sterile exudate Thoracentesis 8/19 >> exudative effusion   SUBJECTIVE: Pt denies breathing difficulties, feels as though she needs to clear secretions but is unable  VITAL SIGNS: Temp:  [98.3 F (36.8 C)-98.7 F (37.1 C)] 98.4 F (36.9 C) (08/22 0436) Pulse Rate:  [73-92] 73 (08/22 0436) Resp:  [16-20] 20 (08/22 0436) BP: (103-122)/(50-63) 103/50 mmHg (08/22 0436) SpO2:  [96 %-99 %] 96 % (08/22 0805)  PHYSICAL EXAMINATION: General:  Alert, NAD elderly F in NAD Neuro: Awake/alert, oriented, MAE, no focal deficits HEENT:  Extensive facial bruising to R face, nasal O2, clear speech, grossly normal hearing/ vision Cardiovascular: RRR, no m/g/r, no jvd, no peripheral edema Lungs:  Coarse, unlabored, no wheeze or cough, Minimal dullness R base w/o rub Abdomen:  Non-tender, BS present Musculoskeletal: normal muscle mass, moving all extremities Skin: no rashes or lesions   Recent Labs Lab 06/11/15 0014 06/12/15 0633 06/13/15 0510  NA 132* 130* 137  K 4.3 4.5 4.4  CL 87* 88* 94*  CO2 32 31 35*  BUN 46* 64* 46*  CREATININE 1.12* 1.57* 1.13*  GLUCOSE 296* 236* 239*    Recent Labs Lab 06/11/15 0014 06/12/15 0633 06/13/15 0510  HGB 11.6* 8.9* 8.9*  HCT 36.5 28.1* 28.1*  WBC 19.4* 10.6* 11.4*  PLT 279 236 296     Dg Chest 2 View  06/13/2015   CLINICAL DATA:  Pleural effusion.  Weakness.  EXAM: CHEST  2 VIEW  COMPARISON:  06/10/2015.  FINDINGS: Mediastinum and hilar structures are unremarkable. Mild infiltrate right mid lung field. Moderate size right pleural effusion. Heart size normal. No pneumothorax.  IMPRESSION: Mild infiltrate right mid lung with moderate-sized right pleural effusion.   Electronically Signed   By: Marcello Moores  Register   On: 06/13/2015 08:40    ASSESSMENT / PLAN:  1) Recurrent R exudative pleural effusion - no infection identified. Suggestion that there may be response to steroids, indicating inflammatory process. Former smoker.   Plan: Follow up 2V CXR without significant change to R pleural effusion size Consider VATS thoracoscopy if pathology / cultures negative from 8/19 thoracentesis but will need to discuss with patient / family regarding risk / benefits given advanced age Await pathology / cultures Intermittent CXR to follow effusion size  2) COPD, GOLD III, O2 dependent.   Plan: Anoro QD Duoneb TID & Q6 PRN  Mucinex BID  Levaquin per primary  Prednisone 40 mg QD x 3 days, completed 8/22  3) Aortic atherosclerosis on CT. She denies heart disease and heart size is normal, but can assume some degree of CAD.  4) Mechanical Fall with Orbital Fx (R) - no memory of fall, hx of macular degeneration in L eye     Noe Gens, NP-C [facility] Pgr: [phone] or if no answer [phone] 06/13/2015, 11:47 AM

## 2015-06-13 NOTE — Progress Notes (Signed)
Inpatient Diabetes Program Recommendations  AACE/ADA: New Consensus Statement on Inpatient Glycemic Control (2013)  Target Ranges:  Prepandial:   less than 140 mg/dL      Peak postprandial:   less than 180 mg/dL (1-2 hours)      Critically ill patients:  140 - 180 mg/dL   Reason for Visit: Hyperglycemia  Diabetes history: DM2 Outpatient Diabetes medications: None Current orders for Inpatient glycemic control: Lantus 10 units QHS, Novolog moderate tidwc + 3 units tidwc  Inpatient Diabetes Program Recommendations Insulin - Basal: Increase Lantus to 15 units QHS Correction (SSI): Add HS coverage Insulin - Meal Coverage: Novolog 3 units tidwc to start today HgbA1C: Need updated HgbA1C to assess glycemic control prior to hospitalization  Note: Will continue to follow. Thank you. Lorenda Peck, RD, LDN, CDE Inpatient Diabetes Coordinator [phone]

## 2015-06-14 ENCOUNTER — Inpatient Hospital Stay (HOSPITAL_COMMUNITY): Payer: Medicare Other

## 2015-06-14 ENCOUNTER — Encounter (HOSPITAL_COMMUNITY): Payer: Self-pay | Admitting: Radiology

## 2015-06-14 DIAGNOSIS — S023XXA Fracture of orbital floor, initial encounter for closed fracture: Secondary | ICD-10-CM

## 2015-06-14 DIAGNOSIS — J90 Pleural effusion, not elsewhere classified: Secondary | ICD-10-CM

## 2015-06-14 LAB — BLOOD GAS, ARTERIAL
Acid-Base Excess: 6.9 mmol/L — ABNORMAL HIGH (ref 0.0–2.0)
BICARBONATE: 32.5 meq/L — AB (ref 20.0–24.0)
Drawn by: 257701
O2 Content: 3 L/min
O2 SAT: 93.2 %
PCO2 ART: 54.3 mmHg — AB (ref 35.0–45.0)
PH ART: 7.395 (ref 7.350–7.450)
PO2 ART: 70.9 mmHg — AB (ref 80.0–100.0)
Patient temperature: 98.6
TCO2: 30.1 mmol/L (ref 0–100)

## 2015-06-14 LAB — BASIC METABOLIC PANEL
Anion gap: 8 (ref 5–15)
BUN: 37 mg/dL — AB (ref 6–20)
CALCIUM: 9 mg/dL (ref 8.9–10.3)
CO2: 35 mmol/L — ABNORMAL HIGH (ref 22–32)
CREATININE: 1.04 mg/dL — AB (ref 0.44–1.00)
Chloride: 95 mmol/L — ABNORMAL LOW (ref 101–111)
GFR calc Af Amer: 56 mL/min — ABNORMAL LOW (ref >60)
GFR, EST NON AFRICAN AMERICAN: 49 mL/min — AB (ref >60)
Glucose, Bld: 180 mg/dL — ABNORMAL HIGH (ref 65–99)
Potassium: 4.3 mmol/L (ref 3.5–5.1)
SODIUM: 138 mmol/L (ref 135–145)

## 2015-06-14 LAB — CULTURE, RESPIRATORY: CULTURE: NORMAL

## 2015-06-14 LAB — GLUCOSE, CAPILLARY
Glucose-Capillary: 160 mg/dL — ABNORMAL HIGH (ref 65–99)
Glucose-Capillary: 200 mg/dL — ABNORMAL HIGH (ref 65–99)

## 2015-06-14 MED ORDER — POLYETHYLENE GLYCOL 3350 17 G PO PACK: 17.0000 g | PACK | Freq: Every day | ORAL | Status: DC

## 2015-06-14 MED ORDER — INSULIN ASPART 100 UNIT/ML FLEXPEN: PEN_INJECTOR | SUBCUTANEOUS | Status: DC

## 2015-06-14 MED ORDER — PREDNISONE 10 MG PO TABS: ORAL_TABLET | ORAL | Status: DC

## 2015-06-14 MED ORDER — OXYCODONE-ACETAMINOPHEN 5-325 MG PO TABS: 1.0000 | ORAL_TABLET | ORAL | Status: DC | PRN

## 2015-06-14 MED ORDER — INSULIN GLARGINE 100 UNIT/ML SOLOSTAR PEN: 10.0000 [IU] | PEN_INJECTOR | Freq: Every day | SUBCUTANEOUS | Status: DC

## 2015-06-14 MED ORDER — PREDNISONE 20 MG PO TABS: 40.0000 mg | ORAL_TABLET | Freq: Every day | ORAL | Status: DC

## 2015-06-14 MED ORDER — SENNOSIDES-DOCUSATE SODIUM 8.6-50 MG PO TABS: 1.0000 | ORAL_TABLET | Freq: Two times a day (BID) | ORAL | Status: DC

## 2015-06-14 NOTE — Care Management Note (Signed)
Case Management Note  Patient Details  Name: Emily Owen MRN: [id number] Date of Birth: 12-17-1929  Subjective/Objective:                    Action/Plan: d/c home w/HHPT.   Expected Discharge Date:                  Expected Discharge Plan:  [place]  In-House Referral:     Discharge planning Services  CM Consult  Post Acute Care Choice:    Choice offered to:  Adult Children  DME Arranged:    DME Agency:     HH Arranged:  PT HH Agency:     Status of Service:  Completed, signed off  Medicare Important Message Given:  Yes-second notification given Date Medicare IM Given:    Medicare IM give by:    Date Additional Medicare IM Given:    Additional Medicare Important Message give by:     If discussed at [place] of Stay Meetings, dates discussed:    Additional Comments:  Dessa Phi, RN 06/14/2015, 12:13 PM

## 2015-06-14 NOTE — Care Management Important Message (Signed)
Important Message  Patient Details  Name: Emily Owen MRN: [id number] Date of Birth: 01-30-1930   Medicare Important Message Given:  Yes-second notification given    Camillo Flaming 06/14/2015, 11:56 AMImportant Message  Patient Details  Name: Emily Owen MRN: [id number] Date of Birth: 1930-01-05   Medicare Important Message Given:  Yes-second notification given    Camillo Flaming 06/14/2015, 11:56 AM

## 2015-06-14 NOTE — Discharge Summary (Signed)
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 Successful ultrasound-guided R sided thoracentesis yielding 500 cc of pleural fluid.  Read by:  Lavonia Drafts [facility]   Electronically Signed   By: Marybelle Killings M.D.   On: 06/10/2015 14:51    Microbiology: Recent Results (from the past 240 hour(s))  Culture, body fluid-bottle     Status: None (Preliminary result)   Collection Time: 06/10/15  2:19 PM  Result Value Ref Range Status   Specimen Description FLUID RIGHT PLEURAL  Final   Special Requests BOTTLES DRAWN AEROBIC AND ANAEROBIC 10CC  Final   Culture   Final    NO GROWTH 3 DAYS Performed at [facility]    Report Status PENDING  Incomplete  Gram stain     Status: None   Collection Time: 06/10/15  2:19 PM  Result Value Ref Range Status   Specimen Description FLUID RIGHT PLEURAL  Final   Special Requests NONE  Final   Gram Stain   Final    CYTOSPIN SMEAR WBC PRESENT,BOTH PMN AND MONONUCLEAR NO ORGANISMS SEEN Performed at [facility]    Report Status 06/10/2015 FINAL  Final  Blood culture (routine x 2)     Status:  None (Preliminary result)   Collection Time: 06/11/15 12:14 AM  Result Value Ref Range Status   Specimen Description BLOOD RIGHT ANTECUBITAL  Final   Special Requests BOTTLES DRAWN AEROBIC AND ANAEROBIC 6CC  Final   Culture NO GROWTH 3 DAYS  Final   Report Status PENDING  Incomplete  Urine culture     Status: None  Collection Time: 06/11/15 12:14 AM  Result Value Ref Range Status   Specimen Description URINE, CATHETERIZED  Final   Special Requests NONE  Final   Culture   Final    NO GROWTH 2 DAYS Performed at [facility]    Report Status 06/13/2015 FINAL  Final  Blood culture (routine x 2)     Status: None (Preliminary result)   Collection Time: 06/11/15 12:25 AM  Result Value Ref Range Status   Specimen Description BLOOD RIGHT HAND  Final   Special Requests BOTTLES DRAWN AEROBIC ONLY 6CC  Final   Culture NO GROWTH 3 DAYS  Final   Report Status PENDING  Incomplete  MRSA PCR Screening     Status: None   Collection Time: 06/11/15 12:12 PM  Result Value Ref Range Status   MRSA by PCR NEGATIVE NEGATIVE Final    Comment:        The GeneXpert MRSA Assay (FDA approved for NASAL specimens only), is one component of a comprehensive MRSA colonization surveillance program. It is not intended to diagnose MRSA infection nor to guide or monitor treatment for MRSA infections.   Culture, expectorated sputum-assessment     Status: None   Collection Time: 06/11/15  5:00 PM  Result Value Ref Range Status   Specimen Description SPUTUM  Final   Special Requests NONE  Final   Sputum evaluation   Final    THIS SPECIMEN IS ACCEPTABLE. RESPIRATORY CULTURE REPORT TO FOLLOW.   Report Status 06/11/2015 FINAL  Final  Culture, respiratory (NON-Expectorated)     Status: None   Collection Time: 06/11/15  5:00 PM  Result Value Ref Range Status   Specimen Description SPUTUM  Final   Special Requests NONE  Final   Gram Stain   Final    ABUNDANT WBC PRESENT, PREDOMINANTLY PMN FEW SQUAMOUS  EPITHELIAL CELLS PRESENT FEW GRAM POSITIVE COCCI IN PAIRS Performed at [facility]    Culture   Final    NORMAL OROPHARYNGEAL FLORA Performed at [facility]    Report Status 06/14/2015 FINAL  Final     Labs: Basic Metabolic Panel:  Recent Labs Lab 06/11/15 0014 06/12/15 0633 06/13/15 0510 06/14/15 0523  NA 132* 130* 137 138  K 4.3 4.5 4.4 4.3  CL 87* 88* 94* 95*  CO2 32 31 35* 35*  GLUCOSE 296* 236* 239* 180*  BUN 46* 64* 46* 37*  CREATININE 1.12* 1.57* 1.13* 1.04*  CALCIUM 8.7* 8.4* 8.7* 9.0  MG  --  1.7 2.0  --    Liver Function Tests:  Recent Labs Lab 06/11/15 0014 06/12/15 0633  AST 19 22  ALT 15 15  ALKPHOS 77 68  BILITOT 1.5* 0.4  PROT 7.0 6.1*  ALBUMIN 3.4* 2.9*   No results for input(s): LIPASE, AMYLASE in the last 168 hours. No results for input(s): AMMONIA in the last 168 hours. CBC:  Recent Labs Lab 06/11/15 0014 06/12/15 0633 06/13/15 0510  WBC 19.4* 10.6* 11.4*  NEUTROABS 16.1* 8.7* 9.3*  HGB 11.6* 8.9* 8.9*  HCT 36.5 28.1* 28.1*  MCV 84.1 82.2 82.4  PLT 279 236 296   Cardiac Enzymes: No results for input(s): CKTOTAL, CKMB, CKMBINDEX, TROPONINI in the last 168 hours. BNP: BNP (last 3 results) No results for input(s): BNP in the last 8760 hours.  ProBNP (last 3 results)  Recent Labs  08/11/14 1307  PROBNP 106.5    CBG:  Recent Labs Lab 06/13/15 1138 06/13/15 1744 06/13/15 2056 06/14/15 0733 06/14/15  [place]       Signed:  Kaitelyn Jamison MD, PhD  [facility] 06/14/2015, 1:40 PM

## 2015-06-14 NOTE — Progress Notes (Addendum)
[facility] PCCM  S: Feels OK, breathing improved  O:  Filed Vitals:   06/13/15 2058 06/13/15 2200 06/14/15 0552 06/14/15 0741  BP: 117/63  106/53   Pulse: 70  78   Temp: 98.7 F (37.1 C)  98.2 F (36.8 C)   TempSrc: Oral  Oral   Resp: 20  20   Height:      Weight:      SpO2: 97% 98% 94% 95%   2L O2 Gen: comfortable in bed HENT: bruising noted PULM: CTA, good air movement CV: RRR, no mgr GI: soft, nontender Derm: large bruise over lower back on R flank, about 15cm inferior to thoracentesis site  CT images from today personally reviewed> small pleural effusion, no pulmonary mass noted  Impression/Plan: Recurrent pleural effusion> doesn't look that much bigger today than when I saw her after her first thoracentesis.  I don't think that she would have therapeutic benefit from a thoracentesis now as she is not symptomatic.   I have had lengthy discussions with her and her daughter regarding a pleural biopsy, neither are enthusiastic about this.  Right now I don't think it is necessary.  Will treat with prednisone taper and will have her f/u with me in clinic at completion of the taper.   Roselie Awkward, MD [place] P[facility]M Pager: [phone] Cell: [phone] After 3pm or if no response, call [phone]

## 2015-06-14 NOTE — Progress Notes (Signed)
Patient to be discharged home and currently wears 2 L [place] at home.  Daughter brought empty tank to the hospital and stated "i'm not driving back to [place] to get it." Patient stated "I'm not waiting here, I'll be fine. I'm ready to go. I go out shopping all the time without my oxygen and am just fine." After much education and attempt to have another family member bring oxygen to the hospital for the patient, the patient and her daughter were adamant about leaving and that "she would be just fine" refusing to wait for another tank.  Patient d/c home.

## 2015-06-14 NOTE — Care Management Note (Signed)
Case Management Note  Patient Details  Name: Shaily Librizzi MRN: [id number] Date of Birth: 1930/08/16  Subjective/Objective:PT-recc HHPT. Spoke to dtr in rm-chose [facility] for HHPT-TC [facility] rep Kristen aware of referral. Await HHPT,face to face order.                    Action/Plan:d/c plan home w/HHC.   Expected Discharge Date:                  Expected Discharge Plan:  [place]  In-House Referral:     Discharge planning Services  CM Consult  Post Acute Care Choice:    Choice offered to:  Adult Children  DME Arranged:    DME Agency:     HH Arranged:    HH Agency:     Status of Service:  In process, will continue to follow  Medicare Important Message Given:    Date Medicare IM Given:    Medicare IM give by:    Date Additional Medicare IM Given:    Additional Medicare Important Message give by:     If discussed at [place] of Stay Meetings, dates discussed:    Additional Comments:  Dessa Phi, RN 06/14/2015, 10:26 AM

## 2015-06-15 LAB — CULTURE, BODY FLUID-BOTTLE

## 2015-06-15 LAB — CULTURE, BODY FLUID W GRAM STAIN -BOTTLE: Culture: NO GROWTH

## 2015-06-15 NOTE — Telephone Encounter (Signed)
Pt returned call  [name] that nurse had called her and can be reached @ [phone].Emily Owen

## 2015-06-16 LAB — CULTURE, BLOOD (ROUTINE X 2)
CULTURE: NO GROWTH
CULTURE: NO GROWTH

## 2015-06-17 DIAGNOSIS — Z87891 Personal history of nicotine dependence: Secondary | ICD-10-CM | POA: Diagnosis not present

## 2015-06-17 DIAGNOSIS — E119 Type 2 diabetes mellitus without complications: Secondary | ICD-10-CM | POA: Diagnosis not present

## 2015-06-17 DIAGNOSIS — J440 Chronic obstructive pulmonary disease with acute lower respiratory infection: Secondary | ICD-10-CM | POA: Diagnosis not present

## 2015-06-17 DIAGNOSIS — Z9181 History of falling: Secondary | ICD-10-CM | POA: Diagnosis not present

## 2015-06-17 DIAGNOSIS — E785 Hyperlipidemia, unspecified: Secondary | ICD-10-CM | POA: Diagnosis not present

## 2015-06-17 DIAGNOSIS — Z9981 Dependence on supplemental oxygen: Secondary | ICD-10-CM | POA: Diagnosis not present

## 2015-06-17 DIAGNOSIS — M109 Gout, unspecified: Secondary | ICD-10-CM | POA: Diagnosis not present

## 2015-06-17 DIAGNOSIS — J189 Pneumonia, unspecified organism: Secondary | ICD-10-CM | POA: Diagnosis not present

## 2015-06-17 DIAGNOSIS — Z85038 Personal history of other malignant neoplasm of large intestine: Secondary | ICD-10-CM | POA: Diagnosis not present

## 2015-06-17 DIAGNOSIS — Z8541 Personal history of malignant neoplasm of cervix uteri: Secondary | ICD-10-CM | POA: Diagnosis not present

## 2015-06-17 DIAGNOSIS — M810 Age-related osteoporosis without current pathological fracture: Secondary | ICD-10-CM | POA: Diagnosis not present

## 2015-06-20 ENCOUNTER — Telehealth: Payer: Self-pay | Admitting: Pulmonary Disease

## 2015-06-20 ENCOUNTER — Ambulatory Visit: Payer: Medicare Other | Admitting: Pulmonary Disease

## 2015-06-20 DIAGNOSIS — J189 Pneumonia, unspecified organism: Secondary | ICD-10-CM | POA: Diagnosis not present

## 2015-06-20 DIAGNOSIS — J440 Chronic obstructive pulmonary disease with acute lower respiratory infection: Secondary | ICD-10-CM | POA: Diagnosis not present

## 2015-06-20 DIAGNOSIS — E119 Type 2 diabetes mellitus without complications: Secondary | ICD-10-CM | POA: Diagnosis not present

## 2015-06-20 DIAGNOSIS — M109 Gout, unspecified: Secondary | ICD-10-CM | POA: Diagnosis not present

## 2015-06-20 DIAGNOSIS — Z9181 History of falling: Secondary | ICD-10-CM | POA: Diagnosis not present

## 2015-06-20 DIAGNOSIS — E785 Hyperlipidemia, unspecified: Secondary | ICD-10-CM | POA: Diagnosis not present

## 2015-06-20 NOTE — Telephone Encounter (Signed)
lmtcb x1 for [employer]

## 2015-06-20 NOTE — Telephone Encounter (Signed)
Spoke with [place] with [facility].  They are requesting a OT and MSW consult.  They are currently in the home providing PT and nursing that was ordered by hospitalist when pt was discharged from [place] 06/14/15.  [name] consulted on pt while in hospital.  Please advise if willing to give orders or if this needs to be deferred to PCP.

## 2015-06-20 NOTE — Telephone Encounter (Signed)
PCP?

## 2015-06-21 NOTE — Telephone Encounter (Signed)
lmomtcb x2 for [employer]

## 2015-06-22 NOTE — Telephone Encounter (Signed)
lmtcb for [place].

## 2015-06-23 NOTE — Telephone Encounter (Signed)
Left detailed message on named vm relaying that these orders need to be deferred to PCP.  Will close message per triage protocol.

## 2015-06-24 DIAGNOSIS — J440 Chronic obstructive pulmonary disease with acute lower respiratory infection: Secondary | ICD-10-CM | POA: Diagnosis not present

## 2015-06-24 DIAGNOSIS — E785 Hyperlipidemia, unspecified: Secondary | ICD-10-CM | POA: Diagnosis not present

## 2015-06-24 DIAGNOSIS — E119 Type 2 diabetes mellitus without complications: Secondary | ICD-10-CM | POA: Diagnosis not present

## 2015-06-24 DIAGNOSIS — J189 Pneumonia, unspecified organism: Secondary | ICD-10-CM | POA: Diagnosis not present

## 2015-06-24 DIAGNOSIS — Z9181 History of falling: Secondary | ICD-10-CM | POA: Diagnosis not present

## 2015-06-24 DIAGNOSIS — M533 Sacrococcygeal disorders, not elsewhere classified: Secondary | ICD-10-CM | POA: Diagnosis not present

## 2015-06-24 DIAGNOSIS — M109 Gout, unspecified: Secondary | ICD-10-CM | POA: Diagnosis not present

## 2015-06-28 DIAGNOSIS — J189 Pneumonia, unspecified organism: Secondary | ICD-10-CM | POA: Diagnosis not present

## 2015-06-28 DIAGNOSIS — Z9181 History of falling: Secondary | ICD-10-CM | POA: Diagnosis not present

## 2015-06-28 DIAGNOSIS — J440 Chronic obstructive pulmonary disease with acute lower respiratory infection: Secondary | ICD-10-CM | POA: Diagnosis not present

## 2015-06-28 DIAGNOSIS — M109 Gout, unspecified: Secondary | ICD-10-CM | POA: Diagnosis not present

## 2015-06-28 DIAGNOSIS — E785 Hyperlipidemia, unspecified: Secondary | ICD-10-CM | POA: Diagnosis not present

## 2015-06-28 DIAGNOSIS — E119 Type 2 diabetes mellitus without complications: Secondary | ICD-10-CM | POA: Diagnosis not present

## 2015-06-30 DIAGNOSIS — Z9181 History of falling: Secondary | ICD-10-CM | POA: Diagnosis not present

## 2015-06-30 DIAGNOSIS — E785 Hyperlipidemia, unspecified: Secondary | ICD-10-CM | POA: Diagnosis not present

## 2015-06-30 DIAGNOSIS — M109 Gout, unspecified: Secondary | ICD-10-CM | POA: Diagnosis not present

## 2015-06-30 DIAGNOSIS — J189 Pneumonia, unspecified organism: Secondary | ICD-10-CM | POA: Diagnosis not present

## 2015-06-30 DIAGNOSIS — J440 Chronic obstructive pulmonary disease with acute lower respiratory infection: Secondary | ICD-10-CM | POA: Diagnosis not present

## 2015-06-30 DIAGNOSIS — E119 Type 2 diabetes mellitus without complications: Secondary | ICD-10-CM | POA: Diagnosis not present

## 2015-07-01 DIAGNOSIS — E119 Type 2 diabetes mellitus without complications: Secondary | ICD-10-CM | POA: Diagnosis not present

## 2015-07-01 DIAGNOSIS — M109 Gout, unspecified: Secondary | ICD-10-CM | POA: Diagnosis not present

## 2015-07-01 DIAGNOSIS — E785 Hyperlipidemia, unspecified: Secondary | ICD-10-CM | POA: Diagnosis not present

## 2015-07-01 DIAGNOSIS — J440 Chronic obstructive pulmonary disease with acute lower respiratory infection: Secondary | ICD-10-CM | POA: Diagnosis not present

## 2015-07-01 DIAGNOSIS — J189 Pneumonia, unspecified organism: Secondary | ICD-10-CM | POA: Diagnosis not present

## 2015-07-01 DIAGNOSIS — Z9181 History of falling: Secondary | ICD-10-CM | POA: Diagnosis not present

## 2015-07-04 ENCOUNTER — Encounter (INDEPENDENT_AMBULATORY_CARE_PROVIDER_SITE_OTHER): Payer: Medicare Other | Admitting: Ophthalmology

## 2015-07-04 DIAGNOSIS — R945 Abnormal results of liver function studies: Secondary | ICD-10-CM | POA: Diagnosis not present

## 2015-07-04 DIAGNOSIS — E119 Type 2 diabetes mellitus without complications: Secondary | ICD-10-CM | POA: Diagnosis not present

## 2015-07-04 DIAGNOSIS — D649 Anemia, unspecified: Secondary | ICD-10-CM | POA: Diagnosis not present

## 2015-07-04 DIAGNOSIS — R944 Abnormal results of kidney function studies: Secondary | ICD-10-CM | POA: Diagnosis not present

## 2015-07-04 DIAGNOSIS — M7981 Nontraumatic hematoma of soft tissue: Secondary | ICD-10-CM | POA: Diagnosis not present

## 2015-07-04 DIAGNOSIS — Z9181 History of falling: Secondary | ICD-10-CM | POA: Diagnosis not present

## 2015-07-04 DIAGNOSIS — E785 Hyperlipidemia, unspecified: Secondary | ICD-10-CM | POA: Diagnosis not present

## 2015-07-04 DIAGNOSIS — M109 Gout, unspecified: Secondary | ICD-10-CM | POA: Diagnosis not present

## 2015-07-04 DIAGNOSIS — W19XXXA Unspecified fall, initial encounter: Secondary | ICD-10-CM | POA: Diagnosis not present

## 2015-07-04 DIAGNOSIS — J440 Chronic obstructive pulmonary disease with acute lower respiratory infection: Secondary | ICD-10-CM | POA: Diagnosis not present

## 2015-07-04 DIAGNOSIS — J189 Pneumonia, unspecified organism: Secondary | ICD-10-CM | POA: Diagnosis not present

## 2015-07-05 ENCOUNTER — Ambulatory Visit (INDEPENDENT_AMBULATORY_CARE_PROVIDER_SITE_OTHER): Payer: Medicare Other | Admitting: Pulmonary Disease

## 2015-07-05 ENCOUNTER — Ambulatory Visit (INDEPENDENT_AMBULATORY_CARE_PROVIDER_SITE_OTHER)
Admission: RE | Admit: 2015-07-05 | Discharge: 2015-07-05 | Disposition: A | Discharge status: Home / Self Care | Payer: Medicare Other | Source: Ambulatory Visit | Attending: Pulmonary Disease | Admitting: Pulmonary Disease

## 2015-07-05 ENCOUNTER — Encounter: Payer: Self-pay | Admitting: Pulmonary Disease

## 2015-07-05 VITALS — BP 124/64 | HR 77 | Ht 65.0 in | Wt 175.0 lb

## 2015-07-05 DIAGNOSIS — J189 Pneumonia, unspecified organism: Secondary | ICD-10-CM | POA: Diagnosis not present

## 2015-07-05 DIAGNOSIS — J449 Chronic obstructive pulmonary disease, unspecified: Secondary | ICD-10-CM | POA: Diagnosis not present

## 2015-07-05 DIAGNOSIS — M109 Gout, unspecified: Secondary | ICD-10-CM | POA: Diagnosis not present

## 2015-07-05 DIAGNOSIS — J90 Pleural effusion, not elsewhere classified: Secondary | ICD-10-CM | POA: Diagnosis not present

## 2015-07-05 DIAGNOSIS — J9811 Atelectasis: Secondary | ICD-10-CM | POA: Diagnosis not present

## 2015-07-05 DIAGNOSIS — J440 Chronic obstructive pulmonary disease with acute lower respiratory infection: Secondary | ICD-10-CM | POA: Diagnosis not present

## 2015-07-05 DIAGNOSIS — E785 Hyperlipidemia, unspecified: Secondary | ICD-10-CM | POA: Diagnosis not present

## 2015-07-05 DIAGNOSIS — Z9181 History of falling: Secondary | ICD-10-CM | POA: Diagnosis not present

## 2015-07-05 DIAGNOSIS — E119 Type 2 diabetes mellitus without complications: Secondary | ICD-10-CM | POA: Diagnosis not present

## 2015-07-05 DIAGNOSIS — Z23 Encounter for immunization: Secondary | ICD-10-CM

## 2015-07-05 MED ORDER — UMECLIDINIUM-VILANTEROL 62.5-25 MCG/INH IN AEPB: 1.0000 | INHALATION_SPRAY | Freq: Every day | RESPIRATORY_TRACT | Status: AC

## 2015-07-05 NOTE — Assessment & Plan Note (Signed)
This is been a stable interval for her severe COPD. She has not had an exacerbation recently. She is a bit frustrated by the cost of her Anoro. We talked about changing her to nebulized medications but she thinks that she would not be compliant with that.  Plan: Apply for financial assistance for the cost of Anoro Flu shot today Follow-up 3 months

## 2015-07-05 NOTE — Progress Notes (Signed)
Subjective:    Patient ID: Emily Owen, female    DOB: 22-Aug-1930, 81 y.o.   MRN: [id number]  Synopsis: Referred in 2016 for severe COPD and evaluation of a pleural effusion after an episode of pneumonia. June 2016 pulmonary function testing ratio 49%, FEV1 0.85 L (44% predicted, 9% change postbronchodilator), total lung capacity 5.33 L (103% predicted), DLCO 12.21 (48% predicted)  HPI Chief Complaint  Patient presents with  . Follow-up    Pt saw [facility] for an acute visit on 8.10.16. Pt then later admited to [facility] in 05/2015. Pt took pred 20mg  for 1 week then 10mg  for 1 week. Pt then had another fall on 9/1, pt did not get evaluated at hosptial. Pt states her breathing is doing well.    Emily Owen had another fall at home after going home from the hospital. This increased the size of her right flank hematoma. However, her dyspnea has been remarkably well since going home. She took the prednisone as prescribed, 20 mg for a week followed by 10 mg for a week. Her last dose was 3 days ago. She does not have a cough. She is frustrated by the cost of her Anoro.  Past Medical History  Diagnosis Date  . COPD (chronic obstructive pulmonary disease)   . Osteoporosis   . Cancer     colon  . Cancer of cervix     cervix  . Gout   . Gallstones   . Hard of hearing   . Pleural effusion       Review of Systems  Constitutional: Negative for fever, chills and fatigue.  HENT: Negative for postnasal drip, rhinorrhea and sinus pressure.   Respiratory: Negative for cough, shortness of breath and wheezing.   Cardiovascular: Negative for chest pain, palpitations and leg swelling.       Objective:   Physical Exam Filed Vitals:   07/05/15 1555  BP: 124/64  Pulse: 77  Height: 5\' 5"  (1.651 m)  Weight: 175 lb (79.379 kg)  SpO2: 92%    2 L O2  Gen: well appearing HENT: OP clear, NCAT PULM: Few crackles bases, normal effort CV: RRR, no mgr, trace edema GI: BS+, soft, nontender Derm: no cyanosis  or rash Psyche: normal mood and affect   Hospitalization records from last month reviewed where she was treated for a fall and we saw her for the recurrent pleural effusion Pleural fluid studies from last month's thoracentesis revealed an exudate with 1800 white blood cells, 60% lymphocytes     Assessment & Plan:  COPD, moderate This is been a stable interval for her severe COPD. She has not had an exacerbation recently. She is a bit frustrated by the cost of her Anoro. We talked about changing her to nebulized medications but she thinks that she would not be compliant with that.  Plan: Apply for financial assistance for the cost of Anoro Flu shot today Follow-up 3 months  Pleural effusion She has had a recurrent exudative effusion this year which has undergone a thoracentesis twice. The most recent one was while she was hospitalized for a fall last month. Originally we felt that it was associated with pneumonia and it was an uncomplicated parapneumonic effusion. However, its persistence is a bit unusual. There was some concern that it may have been steroid responsive. Because of her smoking history we do have an increased suspicion for malignancy, but there is never been any evidence of malignant cells on her cytology on both thoracentesis tests. Further, there  is no evidence of a malignancy on her CT chest.  Plan: As she recently completed a course of prednisone we will check a chest x-ray today to see if the effusion persists If the effusion is still there then we will perform a thoracentesis this week and then perform a CT chest immediately thereafter We again discussed the role for pleural biopsy, neither her nor her daughter are interested in this.     Current outpatient prescriptions:  .  albuterol (PROVENTIL) (2.5 MG/3ML) 0.083% nebulizer solution, Take 2.5 mg by nebulization every 6 (six) hours as needed for wheezing or shortness of breath., Disp: , Rfl:  .  aspirin EC 81 MG  tablet, Take 81 mg by mouth daily., Disp: , Rfl:  .  Cholecalciferol (VITAMIN D) 2000 UNITS CAPS, Take 2,000 Units by mouth daily., Disp: , Rfl:  .  fish oil-omega-3 fatty acids 1000 MG capsule, Take 2 g by mouth 2 (two) times daily., Disp: , Rfl:  .  FUROSEMIDE PO, Take 1 tablet by mouth once as needed (for fluid)., Disp: , Rfl:  .  guaiFENesin (MUCINEX) 600 MG 12 hr tablet, Take 2 tablets (1,200 mg total) by mouth 2 (two) times daily. (Patient taking differently: Take 1,200 mg by mouth 2 (two) times daily as needed. ), Disp: 10 tablet, Rfl: 0 .  Ipratropium-Albuterol (COMBIVENT RESPIMAT) 20-100 MCG/ACT AERS respimat, Inhale 1 puff into the lungs every 6 (six) hours as needed for wheezing. , Disp: , Rfl:  .  oxyCODONE-acetaminophen (PERCOCET/ROXICET) 5-325 MG per tablet, Take 1-2 tablets by mouth every 4 (four) hours as needed for moderate pain or severe pain., Disp: 15 tablet, Rfl: 0 .  polyethylene glycol (MIRALAX / GLYCOLAX) packet, Take 17 g by mouth daily., Disp: 14 each, Rfl: 0 .  PRESCRIPTION MEDICATION, every 30 (thirty) days. Shot in eye every month at Dr. Zigmund Daniel office, Disp: , Rfl:  .  rosuvastatin (CRESTOR) 20 MG tablet, Take 20 mg by mouth at bedtime., Disp: , Rfl:  .  senna-docusate (SENOKOT-S) 8.6-50 MG per tablet, Take 1 tablet by mouth 2 (two) times daily., Disp: 10 tablet, Rfl: 0 .  Tetrahydrozoline HCl (EYE DROPS OP), Place 4 drops into the left eye over 48 hr. Uses after she get shot in eye for 2 days, Disp: , Rfl:  .  Umeclidinium-Vilanterol 62.5-25 MCG/INH AEPB, Inhale 1 puff into the lungs daily., Disp: 60 each, Rfl: 5 .  insulin aspart (NOVOLOG FLEXPEN) 100 UNIT/ML FlexPen, Sliding scale:  Before each meal 3 times a day, 140-199 - 2 units, 200-250 - 4 units, 251-299 - 6 units,  300-349 - 8 units,  350 or above 10 units. Insulin PEN if approved, provide syringes and needles if needed. (Patient not taking: Reported on 07/05/2015), Disp: 15 mL, Rfl: 11 .  Insulin Glargine (LANTUS  SOLOSTAR) 100 UNIT/ML Solostar Pen, Inject 10 Units into the skin daily at 10 pm. (Patient not taking: Reported on 07/05/2015), Disp: 15 mL, Rfl: 11

## 2015-07-05 NOTE — Assessment & Plan Note (Signed)
She has had a recurrent exudative effusion this year which has undergone a thoracentesis twice. The most recent one was while she was hospitalized for a fall last month. Originally we felt that it was associated with pneumonia and it was an uncomplicated parapneumonic effusion. However, its persistence is a bit unusual. There was some concern that it may have been steroid responsive. Because of her smoking history we do have an increased suspicion for malignancy, but there is never been any evidence of malignant cells on her cytology on both thoracentesis tests. Further, there is no evidence of a malignancy on her CT chest.  Plan: As she recently completed a course of prednisone we will check a chest x-ray today to see if the effusion persists If the effusion is still there then we will perform a thoracentesis this week and then perform a CT chest immediately thereafter We again discussed the role for pleural biopsy, neither her nor her daughter are interested in this.

## 2015-07-05 NOTE — Patient Instructions (Signed)
We will call you with the results of today's chest x-ray Keep taking your Anoro as you are doing We will see you back in 3 months or sooner if needed

## 2015-07-06 DIAGNOSIS — J440 Chronic obstructive pulmonary disease with acute lower respiratory infection: Secondary | ICD-10-CM | POA: Diagnosis not present

## 2015-07-06 DIAGNOSIS — J189 Pneumonia, unspecified organism: Secondary | ICD-10-CM | POA: Diagnosis not present

## 2015-07-06 DIAGNOSIS — Z9181 History of falling: Secondary | ICD-10-CM | POA: Diagnosis not present

## 2015-07-06 DIAGNOSIS — E785 Hyperlipidemia, unspecified: Secondary | ICD-10-CM | POA: Diagnosis not present

## 2015-07-06 DIAGNOSIS — M109 Gout, unspecified: Secondary | ICD-10-CM | POA: Diagnosis not present

## 2015-07-06 DIAGNOSIS — E119 Type 2 diabetes mellitus without complications: Secondary | ICD-10-CM | POA: Diagnosis not present

## 2015-07-07 DIAGNOSIS — E785 Hyperlipidemia, unspecified: Secondary | ICD-10-CM | POA: Diagnosis not present

## 2015-07-07 DIAGNOSIS — J440 Chronic obstructive pulmonary disease with acute lower respiratory infection: Secondary | ICD-10-CM | POA: Diagnosis not present

## 2015-07-07 DIAGNOSIS — Z9181 History of falling: Secondary | ICD-10-CM | POA: Diagnosis not present

## 2015-07-07 DIAGNOSIS — J189 Pneumonia, unspecified organism: Secondary | ICD-10-CM | POA: Diagnosis not present

## 2015-07-07 DIAGNOSIS — E119 Type 2 diabetes mellitus without complications: Secondary | ICD-10-CM | POA: Diagnosis not present

## 2015-07-07 DIAGNOSIS — M109 Gout, unspecified: Secondary | ICD-10-CM | POA: Diagnosis not present

## 2015-07-08 ENCOUNTER — Ambulatory Visit (HOSPITAL_COMMUNITY)
Admission: RE | Admit: 2015-07-08 | Discharge: 2015-07-08 | Disposition: A | Discharge status: Home / Self Care | Payer: Medicare Other | Source: Ambulatory Visit | Attending: Pulmonary Disease | Admitting: Pulmonary Disease

## 2015-07-08 DIAGNOSIS — J90 Pleural effusion, not elsewhere classified: Secondary | ICD-10-CM | POA: Diagnosis not present

## 2015-07-08 NOTE — Procedures (Signed)
[facility] PCCM  Ms. Mcginniss presented today for an ultrasound of the chest and possible thoracentesis.  There was a small pocket of fluid identified in the right chest which was too small for a thoracentesis.   We discussed the situation with the patient and her daughter and explained that any risk of performing the procedure outweighed the benefit.  We also discussed thoracic surgery evaluation, but the patient and I (and her family) feel the risk of this outweighs the benefit.  Will continue to monitor the effusion in clinic with Chest X-rays  Roselie Awkward, MD [place] PCCM Pager: [phone] Cell: [phone] After 3pm or if no response, call [phone]

## 2015-07-08 NOTE — Progress Notes (Signed)
No tx done on Ms. Emily Owen

## 2015-07-09 ENCOUNTER — Telehealth: Payer: Self-pay | Admitting: Internal Medicine

## 2015-07-09 NOTE — Telephone Encounter (Signed)
Daughter paula reported Decreased loc/ not taking anything po worse since 9/16 when tcenetesis canceled > rec to ER

## 2015-07-11 ENCOUNTER — Emergency Department (HOSPITAL_COMMUNITY): Payer: Medicare Other

## 2015-07-11 ENCOUNTER — Encounter (HOSPITAL_COMMUNITY): Payer: Self-pay | Admitting: Emergency Medicine

## 2015-07-11 ENCOUNTER — Emergency Department (HOSPITAL_COMMUNITY)
Admission: EM | Admit: 2015-07-11 | Discharge: 2015-07-11 | Disposition: A | Discharge status: Home / Self Care | Payer: Medicare Other | Attending: Emergency Medicine | Admitting: Emergency Medicine

## 2015-07-11 DIAGNOSIS — E785 Hyperlipidemia, unspecified: Secondary | ICD-10-CM | POA: Diagnosis not present

## 2015-07-11 DIAGNOSIS — Z8669 Personal history of other diseases of the nervous system and sense organs: Secondary | ICD-10-CM | POA: Diagnosis not present

## 2015-07-11 DIAGNOSIS — Z794 Long term (current) use of insulin: Secondary | ICD-10-CM | POA: Insufficient documentation

## 2015-07-11 DIAGNOSIS — M109 Gout, unspecified: Secondary | ICD-10-CM | POA: Diagnosis not present

## 2015-07-11 DIAGNOSIS — Z85038 Personal history of other malignant neoplasm of large intestine: Secondary | ICD-10-CM | POA: Diagnosis not present

## 2015-07-11 DIAGNOSIS — Z87891 Personal history of nicotine dependence: Secondary | ICD-10-CM | POA: Insufficient documentation

## 2015-07-11 DIAGNOSIS — R531 Weakness: Secondary | ICD-10-CM

## 2015-07-11 DIAGNOSIS — R509 Fever, unspecified: Secondary | ICD-10-CM | POA: Diagnosis present

## 2015-07-11 DIAGNOSIS — E119 Type 2 diabetes mellitus without complications: Secondary | ICD-10-CM | POA: Diagnosis not present

## 2015-07-11 DIAGNOSIS — J189 Pneumonia, unspecified organism: Secondary | ICD-10-CM | POA: Diagnosis not present

## 2015-07-11 DIAGNOSIS — Z8541 Personal history of malignant neoplasm of cervix uteri: Secondary | ICD-10-CM | POA: Insufficient documentation

## 2015-07-11 DIAGNOSIS — M7981 Nontraumatic hematoma of soft tissue: Secondary | ICD-10-CM | POA: Diagnosis not present

## 2015-07-11 DIAGNOSIS — R630 Anorexia: Secondary | ICD-10-CM | POA: Diagnosis not present

## 2015-07-11 DIAGNOSIS — Z7982 Long term (current) use of aspirin: Secondary | ICD-10-CM | POA: Insufficient documentation

## 2015-07-11 DIAGNOSIS — R918 Other nonspecific abnormal finding of lung field: Secondary | ICD-10-CM | POA: Diagnosis not present

## 2015-07-11 DIAGNOSIS — Z8719 Personal history of other diseases of the digestive system: Secondary | ICD-10-CM | POA: Diagnosis not present

## 2015-07-11 DIAGNOSIS — J449 Chronic obstructive pulmonary disease, unspecified: Secondary | ICD-10-CM | POA: Insufficient documentation

## 2015-07-11 DIAGNOSIS — J440 Chronic obstructive pulmonary disease with acute lower respiratory infection: Secondary | ICD-10-CM | POA: Diagnosis not present

## 2015-07-11 DIAGNOSIS — R1110 Vomiting, unspecified: Secondary | ICD-10-CM | POA: Diagnosis not present

## 2015-07-11 DIAGNOSIS — Z79899 Other long term (current) drug therapy: Secondary | ICD-10-CM | POA: Diagnosis not present

## 2015-07-11 DIAGNOSIS — R5383 Other fatigue: Secondary | ICD-10-CM | POA: Diagnosis not present

## 2015-07-11 DIAGNOSIS — R110 Nausea: Secondary | ICD-10-CM | POA: Diagnosis not present

## 2015-07-11 DIAGNOSIS — R1011 Right upper quadrant pain: Secondary | ICD-10-CM | POA: Insufficient documentation

## 2015-07-11 DIAGNOSIS — Z9181 History of falling: Secondary | ICD-10-CM | POA: Diagnosis not present

## 2015-07-11 LAB — COMPREHENSIVE METABOLIC PANEL
ALT: 12 U/L — AB (ref 14–54)
ANION GAP: 8 (ref 5–15)
AST: 12 U/L — ABNORMAL LOW (ref 15–41)
Albumin: 2.9 g/dL — ABNORMAL LOW (ref 3.5–5.0)
Alkaline Phosphatase: 86 U/L (ref 38–126)
BUN: 32 mg/dL — ABNORMAL HIGH (ref 6–20)
CHLORIDE: 95 mmol/L — AB (ref 101–111)
CO2: 33 mmol/L — AB (ref 22–32)
CREATININE: 1.02 mg/dL — AB (ref 0.44–1.00)
Calcium: 8.1 mg/dL — ABNORMAL LOW (ref 8.9–10.3)
GFR, EST AFRICAN AMERICAN: 58 mL/min — AB (ref >60)
GFR, EST NON AFRICAN AMERICAN: 50 mL/min — AB (ref >60)
Glucose, Bld: 186 mg/dL — ABNORMAL HIGH (ref 65–99)
POTASSIUM: 3.5 mmol/L (ref 3.5–5.1)
SODIUM: 136 mmol/L (ref 135–145)
Total Bilirubin: 1 mg/dL (ref 0.3–1.2)
Total Protein: 6.5 g/dL (ref 6.5–8.1)

## 2015-07-11 LAB — DIFFERENTIAL
BASOS PCT: 0 %
Basophils Absolute: 0 10*3/uL (ref 0.0–0.1)
EOS ABS: 0 10*3/uL (ref 0.0–0.7)
EOS PCT: 0 %
Lymphocytes Relative: 11 %
Lymphs Abs: 1.4 10*3/uL (ref 0.7–4.0)
MONO ABS: 1.1 10*3/uL — AB (ref 0.1–1.0)
MONOS PCT: 8 %
NEUTROS ABS: 10.7 10*3/uL — AB (ref 1.7–7.7)
Neutrophils Relative %: 81 %

## 2015-07-11 LAB — URINALYSIS, ROUTINE W REFLEX MICROSCOPIC
GLUCOSE, UA: NEGATIVE mg/dL
HGB URINE DIPSTICK: NEGATIVE
Leukocytes, UA: NEGATIVE
Nitrite: NEGATIVE
PROTEIN: 100 mg/dL — AB
Specific Gravity, Urine: >1.03 — ABNORMAL HIGH (ref 1.005–1.030)
Urobilinogen, UA: 0.2 mg/dL (ref 0.0–1.0)
pH: 5 (ref 5.0–8.0)

## 2015-07-11 LAB — URINE MICROSCOPIC-ADD ON

## 2015-07-11 LAB — I-STAT CG4 LACTIC ACID, ED: LACTIC ACID, VENOUS: 1.23 mmol/L (ref 0.5–2.0)

## 2015-07-11 LAB — CBC
HEMATOCRIT: 31.8 % — AB (ref 36.0–46.0)
Hemoglobin: 9.8 g/dL — ABNORMAL LOW (ref 12.0–15.0)
MCH: 26.6 pg (ref 26.0–34.0)
MCHC: 30.8 g/dL (ref 30.0–36.0)
MCV: 86.4 fL (ref 78.0–100.0)
PLATELETS: 160 10*3/uL (ref 150–400)
RBC: 3.68 MIL/uL — ABNORMAL LOW (ref 3.87–5.11)
RDW: 15.5 % (ref 11.5–15.5)
WBC: 13.2 10*3/uL — AB (ref 4.0–10.5)

## 2015-07-11 LAB — PROTIME-INR
INR: 1.41 (ref 0.00–1.49)
Prothrombin Time: 17.3 seconds — ABNORMAL HIGH (ref 11.6–15.2)

## 2015-07-11 LAB — CBG MONITORING, ED: Glucose-Capillary: 184 mg/dL — ABNORMAL HIGH (ref 65–99)

## 2015-07-11 MED ORDER — SODIUM CHLORIDE 0.9 % IV BOLUS (SEPSIS)
500.0000 mL | Freq: Once | INTRAVENOUS | Status: AC
  Administered 2015-07-11: 500 mL via INTRAVENOUS

## 2015-07-11 NOTE — ED Notes (Signed)
Pt was supposed to have a thoracentesis on Fri, too much fluid after a fall, unable to do thoracentesis. Family reports pt started having emesis on Fri after leaving the dr. Abbott Pao has not been eating or drinking, has become lethargic, urine is very concentrated.
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
## 2015-07-11 NOTE — ED Notes (Signed)
Code Stroke activation ordered in error. Order discontinued, Financial controller notified.

## 2015-07-11 NOTE — ED Notes (Signed)
Pt ambulated with walker and standby assist on O2 at 3L. Pt ambulated to restroom and around nurse's station x 1. Denies weakness/dizziness/sob/pain. VSS upon returning to stretcher. nad noted.

## 2015-07-11 NOTE — Discharge Instructions (Signed)

## 2015-07-11 NOTE — ED Notes (Signed)
Pt's daughter states her mom has not been eating or drinking for the past few days. States she had a fever earlier today. Denies n/v/d

## 2015-07-13 DIAGNOSIS — J440 Chronic obstructive pulmonary disease with acute lower respiratory infection: Secondary | ICD-10-CM | POA: Diagnosis not present

## 2015-07-13 DIAGNOSIS — E119 Type 2 diabetes mellitus without complications: Secondary | ICD-10-CM | POA: Diagnosis not present

## 2015-07-13 DIAGNOSIS — E785 Hyperlipidemia, unspecified: Secondary | ICD-10-CM | POA: Diagnosis not present

## 2015-07-13 DIAGNOSIS — M109 Gout, unspecified: Secondary | ICD-10-CM | POA: Diagnosis not present

## 2015-07-13 DIAGNOSIS — J189 Pneumonia, unspecified organism: Secondary | ICD-10-CM | POA: Diagnosis not present

## 2015-07-13 DIAGNOSIS — Z9181 History of falling: Secondary | ICD-10-CM | POA: Diagnosis not present

## 2015-07-13 LAB — URINE CULTURE

## 2015-07-14 DIAGNOSIS — J189 Pneumonia, unspecified organism: Secondary | ICD-10-CM | POA: Diagnosis not present

## 2015-07-14 DIAGNOSIS — J440 Chronic obstructive pulmonary disease with acute lower respiratory infection: Secondary | ICD-10-CM | POA: Diagnosis not present

## 2015-07-14 DIAGNOSIS — E785 Hyperlipidemia, unspecified: Secondary | ICD-10-CM | POA: Diagnosis not present

## 2015-07-14 DIAGNOSIS — Z9181 History of falling: Secondary | ICD-10-CM | POA: Diagnosis not present

## 2015-07-14 DIAGNOSIS — M109 Gout, unspecified: Secondary | ICD-10-CM | POA: Diagnosis not present

## 2015-07-14 DIAGNOSIS — E119 Type 2 diabetes mellitus without complications: Secondary | ICD-10-CM | POA: Diagnosis not present

## 2015-07-18 DIAGNOSIS — Z9181 History of falling: Secondary | ICD-10-CM | POA: Diagnosis not present

## 2015-07-18 DIAGNOSIS — E119 Type 2 diabetes mellitus without complications: Secondary | ICD-10-CM | POA: Diagnosis not present

## 2015-07-18 DIAGNOSIS — E785 Hyperlipidemia, unspecified: Secondary | ICD-10-CM | POA: Diagnosis not present

## 2015-07-18 DIAGNOSIS — J440 Chronic obstructive pulmonary disease with acute lower respiratory infection: Secondary | ICD-10-CM | POA: Diagnosis not present

## 2015-07-18 DIAGNOSIS — J189 Pneumonia, unspecified organism: Secondary | ICD-10-CM | POA: Diagnosis not present

## 2015-07-18 DIAGNOSIS — M109 Gout, unspecified: Secondary | ICD-10-CM | POA: Diagnosis not present

## 2015-07-19 DIAGNOSIS — J189 Pneumonia, unspecified organism: Secondary | ICD-10-CM | POA: Diagnosis not present

## 2015-07-19 DIAGNOSIS — E785 Hyperlipidemia, unspecified: Secondary | ICD-10-CM | POA: Diagnosis not present

## 2015-07-19 DIAGNOSIS — M109 Gout, unspecified: Secondary | ICD-10-CM | POA: Diagnosis not present

## 2015-07-19 DIAGNOSIS — Z9181 History of falling: Secondary | ICD-10-CM | POA: Diagnosis not present

## 2015-07-19 DIAGNOSIS — J440 Chronic obstructive pulmonary disease with acute lower respiratory infection: Secondary | ICD-10-CM | POA: Diagnosis not present

## 2015-07-19 DIAGNOSIS — E119 Type 2 diabetes mellitus without complications: Secondary | ICD-10-CM | POA: Diagnosis not present

## 2015-07-20 DIAGNOSIS — Z9181 History of falling: Secondary | ICD-10-CM | POA: Diagnosis not present

## 2015-07-20 DIAGNOSIS — J189 Pneumonia, unspecified organism: Secondary | ICD-10-CM | POA: Diagnosis not present

## 2015-07-20 DIAGNOSIS — E785 Hyperlipidemia, unspecified: Secondary | ICD-10-CM | POA: Diagnosis not present

## 2015-07-20 DIAGNOSIS — E119 Type 2 diabetes mellitus without complications: Secondary | ICD-10-CM | POA: Diagnosis not present

## 2015-07-20 DIAGNOSIS — J440 Chronic obstructive pulmonary disease with acute lower respiratory infection: Secondary | ICD-10-CM | POA: Diagnosis not present

## 2015-07-20 DIAGNOSIS — M109 Gout, unspecified: Secondary | ICD-10-CM | POA: Diagnosis not present

## 2015-07-21 DIAGNOSIS — J189 Pneumonia, unspecified organism: Secondary | ICD-10-CM | POA: Diagnosis not present

## 2015-07-21 DIAGNOSIS — M109 Gout, unspecified: Secondary | ICD-10-CM | POA: Diagnosis not present

## 2015-07-21 DIAGNOSIS — E785 Hyperlipidemia, unspecified: Secondary | ICD-10-CM | POA: Diagnosis not present

## 2015-07-21 DIAGNOSIS — E119 Type 2 diabetes mellitus without complications: Secondary | ICD-10-CM | POA: Diagnosis not present

## 2015-07-21 DIAGNOSIS — Z9181 History of falling: Secondary | ICD-10-CM | POA: Diagnosis not present

## 2015-07-21 DIAGNOSIS — J440 Chronic obstructive pulmonary disease with acute lower respiratory infection: Secondary | ICD-10-CM | POA: Diagnosis not present

## 2015-07-25 ENCOUNTER — Ambulatory Visit: Payer: Medicare Other | Admitting: Internal Medicine

## 2015-07-25 DIAGNOSIS — J440 Chronic obstructive pulmonary disease with acute lower respiratory infection: Secondary | ICD-10-CM | POA: Diagnosis not present

## 2015-07-25 DIAGNOSIS — J189 Pneumonia, unspecified organism: Secondary | ICD-10-CM | POA: Diagnosis not present

## 2015-07-25 DIAGNOSIS — Z9181 History of falling: Secondary | ICD-10-CM | POA: Diagnosis not present

## 2015-07-25 DIAGNOSIS — E785 Hyperlipidemia, unspecified: Secondary | ICD-10-CM | POA: Diagnosis not present

## 2015-07-25 DIAGNOSIS — M109 Gout, unspecified: Secondary | ICD-10-CM | POA: Diagnosis not present

## 2015-07-25 DIAGNOSIS — E119 Type 2 diabetes mellitus without complications: Secondary | ICD-10-CM | POA: Diagnosis not present

## 2015-07-27 DIAGNOSIS — E119 Type 2 diabetes mellitus without complications: Secondary | ICD-10-CM | POA: Diagnosis not present

## 2015-07-27 DIAGNOSIS — J440 Chronic obstructive pulmonary disease with acute lower respiratory infection: Secondary | ICD-10-CM | POA: Diagnosis not present

## 2015-07-27 DIAGNOSIS — J189 Pneumonia, unspecified organism: Secondary | ICD-10-CM | POA: Diagnosis not present

## 2015-07-27 DIAGNOSIS — Z9181 History of falling: Secondary | ICD-10-CM | POA: Diagnosis not present

## 2015-07-27 DIAGNOSIS — M109 Gout, unspecified: Secondary | ICD-10-CM | POA: Diagnosis not present

## 2015-07-27 DIAGNOSIS — E785 Hyperlipidemia, unspecified: Secondary | ICD-10-CM | POA: Diagnosis not present

## 2015-07-28 DIAGNOSIS — E119 Type 2 diabetes mellitus without complications: Secondary | ICD-10-CM | POA: Diagnosis not present

## 2015-07-28 DIAGNOSIS — J189 Pneumonia, unspecified organism: Secondary | ICD-10-CM | POA: Diagnosis not present

## 2015-07-28 DIAGNOSIS — E785 Hyperlipidemia, unspecified: Secondary | ICD-10-CM | POA: Diagnosis not present

## 2015-07-28 DIAGNOSIS — M109 Gout, unspecified: Secondary | ICD-10-CM | POA: Diagnosis not present

## 2015-07-28 DIAGNOSIS — J440 Chronic obstructive pulmonary disease with acute lower respiratory infection: Secondary | ICD-10-CM | POA: Diagnosis not present

## 2015-07-28 DIAGNOSIS — Z9181 History of falling: Secondary | ICD-10-CM | POA: Diagnosis not present

## 2015-07-29 DIAGNOSIS — J440 Chronic obstructive pulmonary disease with acute lower respiratory infection: Secondary | ICD-10-CM | POA: Diagnosis not present

## 2015-07-29 DIAGNOSIS — M109 Gout, unspecified: Secondary | ICD-10-CM | POA: Diagnosis not present

## 2015-07-29 DIAGNOSIS — J189 Pneumonia, unspecified organism: Secondary | ICD-10-CM | POA: Diagnosis not present

## 2015-07-29 DIAGNOSIS — E119 Type 2 diabetes mellitus without complications: Secondary | ICD-10-CM | POA: Diagnosis not present

## 2015-07-29 DIAGNOSIS — E785 Hyperlipidemia, unspecified: Secondary | ICD-10-CM | POA: Diagnosis not present

## 2015-07-29 DIAGNOSIS — Z9181 History of falling: Secondary | ICD-10-CM | POA: Diagnosis not present

## 2015-08-02 DIAGNOSIS — E119 Type 2 diabetes mellitus without complications: Secondary | ICD-10-CM | POA: Diagnosis not present

## 2015-08-02 DIAGNOSIS — E785 Hyperlipidemia, unspecified: Secondary | ICD-10-CM | POA: Diagnosis not present

## 2015-08-02 DIAGNOSIS — Z9181 History of falling: Secondary | ICD-10-CM | POA: Diagnosis not present

## 2015-08-02 DIAGNOSIS — J440 Chronic obstructive pulmonary disease with acute lower respiratory infection: Secondary | ICD-10-CM | POA: Diagnosis not present

## 2015-08-02 DIAGNOSIS — J189 Pneumonia, unspecified organism: Secondary | ICD-10-CM | POA: Diagnosis not present

## 2015-08-02 DIAGNOSIS — M109 Gout, unspecified: Secondary | ICD-10-CM | POA: Diagnosis not present

## 2015-08-04 ENCOUNTER — Telehealth: Payer: Self-pay | Admitting: Pulmonary Disease

## 2015-08-04 DIAGNOSIS — M109 Gout, unspecified: Secondary | ICD-10-CM | POA: Diagnosis not present

## 2015-08-04 DIAGNOSIS — J189 Pneumonia, unspecified organism: Secondary | ICD-10-CM | POA: Diagnosis not present

## 2015-08-04 DIAGNOSIS — J440 Chronic obstructive pulmonary disease with acute lower respiratory infection: Secondary | ICD-10-CM | POA: Diagnosis not present

## 2015-08-04 DIAGNOSIS — E119 Type 2 diabetes mellitus without complications: Secondary | ICD-10-CM | POA: Diagnosis not present

## 2015-08-04 DIAGNOSIS — E785 Hyperlipidemia, unspecified: Secondary | ICD-10-CM | POA: Diagnosis not present

## 2015-08-04 DIAGNOSIS — Z9181 History of falling: Secondary | ICD-10-CM | POA: Diagnosis not present

## 2015-08-04 NOTE — Telephone Encounter (Signed)
lmomtcb x1 

## 2015-08-05 DIAGNOSIS — E119 Type 2 diabetes mellitus without complications: Secondary | ICD-10-CM | POA: Diagnosis not present

## 2015-08-05 DIAGNOSIS — J189 Pneumonia, unspecified organism: Secondary | ICD-10-CM | POA: Diagnosis not present

## 2015-08-05 DIAGNOSIS — Z9181 History of falling: Secondary | ICD-10-CM | POA: Diagnosis not present

## 2015-08-05 DIAGNOSIS — J440 Chronic obstructive pulmonary disease with acute lower respiratory infection: Secondary | ICD-10-CM | POA: Diagnosis not present

## 2015-08-05 DIAGNOSIS — E785 Hyperlipidemia, unspecified: Secondary | ICD-10-CM | POA: Diagnosis not present

## 2015-08-05 DIAGNOSIS — M109 Gout, unspecified: Secondary | ICD-10-CM | POA: Diagnosis not present

## 2015-08-05 NOTE — Telephone Encounter (Signed)
Called and spoke to pt's daughter, [name]. [name] stated the pt is feeling better than she was and she will call back if they are needing an appt with BQ. Offered appt with another provider if needed, Pt and daughter decline. Nothing further needed at this time.

## 2015-08-09 DIAGNOSIS — E119 Type 2 diabetes mellitus without complications: Secondary | ICD-10-CM | POA: Diagnosis not present

## 2015-08-09 DIAGNOSIS — J440 Chronic obstructive pulmonary disease with acute lower respiratory infection: Secondary | ICD-10-CM | POA: Diagnosis not present

## 2015-08-09 DIAGNOSIS — J189 Pneumonia, unspecified organism: Secondary | ICD-10-CM | POA: Diagnosis not present

## 2015-08-09 DIAGNOSIS — Z9181 History of falling: Secondary | ICD-10-CM | POA: Diagnosis not present

## 2015-08-09 DIAGNOSIS — E785 Hyperlipidemia, unspecified: Secondary | ICD-10-CM | POA: Diagnosis not present

## 2015-08-09 DIAGNOSIS — M109 Gout, unspecified: Secondary | ICD-10-CM | POA: Diagnosis not present

## 2015-08-11 DIAGNOSIS — Z9181 History of falling: Secondary | ICD-10-CM | POA: Diagnosis not present

## 2015-08-11 DIAGNOSIS — J440 Chronic obstructive pulmonary disease with acute lower respiratory infection: Secondary | ICD-10-CM | POA: Diagnosis not present

## 2015-08-11 DIAGNOSIS — J189 Pneumonia, unspecified organism: Secondary | ICD-10-CM | POA: Diagnosis not present

## 2015-08-11 DIAGNOSIS — E785 Hyperlipidemia, unspecified: Secondary | ICD-10-CM | POA: Diagnosis not present

## 2015-08-11 DIAGNOSIS — E119 Type 2 diabetes mellitus without complications: Secondary | ICD-10-CM | POA: Diagnosis not present

## 2015-08-11 DIAGNOSIS — M109 Gout, unspecified: Secondary | ICD-10-CM | POA: Diagnosis not present

## 2015-08-12 DIAGNOSIS — J189 Pneumonia, unspecified organism: Secondary | ICD-10-CM | POA: Diagnosis not present

## 2015-08-12 DIAGNOSIS — Z9181 History of falling: Secondary | ICD-10-CM | POA: Diagnosis not present

## 2015-08-12 DIAGNOSIS — J440 Chronic obstructive pulmonary disease with acute lower respiratory infection: Secondary | ICD-10-CM | POA: Diagnosis not present

## 2015-08-12 DIAGNOSIS — E785 Hyperlipidemia, unspecified: Secondary | ICD-10-CM | POA: Diagnosis not present

## 2015-08-12 DIAGNOSIS — M109 Gout, unspecified: Secondary | ICD-10-CM | POA: Diagnosis not present

## 2015-08-12 DIAGNOSIS — E119 Type 2 diabetes mellitus without complications: Secondary | ICD-10-CM | POA: Diagnosis not present

## 2015-08-15 DIAGNOSIS — M7981 Nontraumatic hematoma of soft tissue: Secondary | ICD-10-CM | POA: Diagnosis not present

## 2015-08-15 DIAGNOSIS — R0602 Shortness of breath: Secondary | ICD-10-CM | POA: Diagnosis not present

## 2015-08-16 DIAGNOSIS — Z9981 Dependence on supplemental oxygen: Secondary | ICD-10-CM | POA: Diagnosis not present

## 2015-08-16 DIAGNOSIS — Z87891 Personal history of nicotine dependence: Secondary | ICD-10-CM | POA: Diagnosis not present

## 2015-08-16 DIAGNOSIS — J449 Chronic obstructive pulmonary disease, unspecified: Secondary | ICD-10-CM | POA: Diagnosis not present

## 2015-08-16 DIAGNOSIS — Z8541 Personal history of malignant neoplasm of cervix uteri: Secondary | ICD-10-CM | POA: Diagnosis not present

## 2015-08-16 DIAGNOSIS — M810 Age-related osteoporosis without current pathological fracture: Secondary | ICD-10-CM | POA: Diagnosis not present

## 2015-08-16 DIAGNOSIS — M109 Gout, unspecified: Secondary | ICD-10-CM | POA: Diagnosis not present

## 2015-08-16 DIAGNOSIS — Z85038 Personal history of other malignant neoplasm of large intestine: Secondary | ICD-10-CM | POA: Diagnosis not present

## 2015-08-16 DIAGNOSIS — Z8701 Personal history of pneumonia (recurrent): Secondary | ICD-10-CM | POA: Diagnosis not present

## 2015-08-16 DIAGNOSIS — E119 Type 2 diabetes mellitus without complications: Secondary | ICD-10-CM | POA: Diagnosis not present

## 2015-08-16 DIAGNOSIS — Z7982 Long term (current) use of aspirin: Secondary | ICD-10-CM | POA: Diagnosis not present

## 2015-08-16 DIAGNOSIS — Z9181 History of falling: Secondary | ICD-10-CM | POA: Diagnosis not present

## 2015-08-16 DIAGNOSIS — E785 Hyperlipidemia, unspecified: Secondary | ICD-10-CM | POA: Diagnosis not present

## 2015-08-17 DIAGNOSIS — E119 Type 2 diabetes mellitus without complications: Secondary | ICD-10-CM | POA: Diagnosis not present

## 2015-08-17 DIAGNOSIS — J449 Chronic obstructive pulmonary disease, unspecified: Secondary | ICD-10-CM | POA: Diagnosis not present

## 2015-08-17 DIAGNOSIS — M109 Gout, unspecified: Secondary | ICD-10-CM | POA: Diagnosis not present

## 2015-08-17 DIAGNOSIS — M810 Age-related osteoporosis without current pathological fracture: Secondary | ICD-10-CM | POA: Diagnosis not present

## 2015-08-17 DIAGNOSIS — E785 Hyperlipidemia, unspecified: Secondary | ICD-10-CM | POA: Diagnosis not present

## 2015-08-17 DIAGNOSIS — Z9181 History of falling: Secondary | ICD-10-CM | POA: Diagnosis not present

## 2015-08-22 ENCOUNTER — Telehealth: Payer: Self-pay | Admitting: Pulmonary Disease

## 2015-08-22 DIAGNOSIS — J449 Chronic obstructive pulmonary disease, unspecified: Secondary | ICD-10-CM

## 2015-08-22 NOTE — Telephone Encounter (Signed)
Called and spoke to pt's daughter, Sherron Flemings. Pt is needing to change DME companies from [place] to [facility] because of insurance not covering [place] anymore. Pt uses [employer] 24/7 through [place]. Order placed. Pt's daughter verbalized understanding and denied any further questions or concerns at this time.

## 2015-08-25 ENCOUNTER — Telehealth: Payer: Self-pay | Admitting: Pulmonary Disease

## 2015-08-25 DIAGNOSIS — E785 Hyperlipidemia, unspecified: Secondary | ICD-10-CM | POA: Diagnosis not present

## 2015-08-25 DIAGNOSIS — J449 Chronic obstructive pulmonary disease, unspecified: Secondary | ICD-10-CM | POA: Diagnosis not present

## 2015-08-25 DIAGNOSIS — Z9181 History of falling: Secondary | ICD-10-CM | POA: Diagnosis not present

## 2015-08-25 DIAGNOSIS — M109 Gout, unspecified: Secondary | ICD-10-CM | POA: Diagnosis not present

## 2015-08-25 DIAGNOSIS — M810 Age-related osteoporosis without current pathological fracture: Secondary | ICD-10-CM | POA: Diagnosis not present

## 2015-08-25 DIAGNOSIS — E119 Type 2 diabetes mellitus without complications: Secondary | ICD-10-CM | POA: Diagnosis not present

## 2015-08-25 NOTE — Telephone Encounter (Signed)
LMTCB for the pt to be sure they made her aware

## 2015-08-26 NOTE — Telephone Encounter (Signed)
lmtcb for pt.  

## 2015-08-29 NOTE — Telephone Encounter (Signed)
Patient notified that we spoke with Melissa at [facility] and she said that she has "capped out" of medicare and that we cannot switch her from [place] to [facility] until Medicare starts paying again.  Patient says that Medicare should start paying again at the end of November, but she is not sure, she said that she has an appointment on the 13th and will discuss with Dr. Lake Bells at that time.  Nothing further needed. Closing encounter

## 2015-09-01 DIAGNOSIS — M109 Gout, unspecified: Secondary | ICD-10-CM | POA: Diagnosis not present

## 2015-09-01 DIAGNOSIS — E785 Hyperlipidemia, unspecified: Secondary | ICD-10-CM | POA: Diagnosis not present

## 2015-09-01 DIAGNOSIS — M810 Age-related osteoporosis without current pathological fracture: Secondary | ICD-10-CM | POA: Diagnosis not present

## 2015-09-01 DIAGNOSIS — J449 Chronic obstructive pulmonary disease, unspecified: Secondary | ICD-10-CM | POA: Diagnosis not present

## 2015-09-01 DIAGNOSIS — Z9181 History of falling: Secondary | ICD-10-CM | POA: Diagnosis not present

## 2015-09-01 DIAGNOSIS — E119 Type 2 diabetes mellitus without complications: Secondary | ICD-10-CM | POA: Diagnosis not present

## 2015-09-03 DIAGNOSIS — M1000 Idiopathic gout, unspecified site: Secondary | ICD-10-CM | POA: Diagnosis not present

## 2015-09-06 DIAGNOSIS — E119 Type 2 diabetes mellitus without complications: Secondary | ICD-10-CM | POA: Diagnosis not present

## 2015-09-06 DIAGNOSIS — Z9181 History of falling: Secondary | ICD-10-CM | POA: Diagnosis not present

## 2015-09-06 DIAGNOSIS — E785 Hyperlipidemia, unspecified: Secondary | ICD-10-CM | POA: Diagnosis not present

## 2015-09-06 DIAGNOSIS — M109 Gout, unspecified: Secondary | ICD-10-CM | POA: Diagnosis not present

## 2015-09-06 DIAGNOSIS — J449 Chronic obstructive pulmonary disease, unspecified: Secondary | ICD-10-CM | POA: Diagnosis not present

## 2015-09-06 DIAGNOSIS — M810 Age-related osteoporosis without current pathological fracture: Secondary | ICD-10-CM | POA: Diagnosis not present

## 2015-09-12 DIAGNOSIS — E119 Type 2 diabetes mellitus without complications: Secondary | ICD-10-CM | POA: Diagnosis not present

## 2015-09-12 DIAGNOSIS — J449 Chronic obstructive pulmonary disease, unspecified: Secondary | ICD-10-CM | POA: Diagnosis not present

## 2015-09-12 DIAGNOSIS — M109 Gout, unspecified: Secondary | ICD-10-CM | POA: Diagnosis not present

## 2015-09-12 DIAGNOSIS — E785 Hyperlipidemia, unspecified: Secondary | ICD-10-CM | POA: Diagnosis not present

## 2015-09-12 DIAGNOSIS — Z9181 History of falling: Secondary | ICD-10-CM | POA: Diagnosis not present

## 2015-09-12 DIAGNOSIS — M810 Age-related osteoporosis without current pathological fracture: Secondary | ICD-10-CM | POA: Diagnosis not present

## 2015-09-21 DIAGNOSIS — M109 Gout, unspecified: Secondary | ICD-10-CM | POA: Diagnosis not present

## 2015-09-21 DIAGNOSIS — M810 Age-related osteoporosis without current pathological fracture: Secondary | ICD-10-CM | POA: Diagnosis not present

## 2015-09-21 DIAGNOSIS — J449 Chronic obstructive pulmonary disease, unspecified: Secondary | ICD-10-CM | POA: Diagnosis not present

## 2015-09-21 DIAGNOSIS — E785 Hyperlipidemia, unspecified: Secondary | ICD-10-CM | POA: Diagnosis not present

## 2015-09-21 DIAGNOSIS — Z9181 History of falling: Secondary | ICD-10-CM | POA: Diagnosis not present

## 2015-09-21 DIAGNOSIS — E119 Type 2 diabetes mellitus without complications: Secondary | ICD-10-CM | POA: Diagnosis not present

## 2015-09-29 DIAGNOSIS — M810 Age-related osteoporosis without current pathological fracture: Secondary | ICD-10-CM | POA: Diagnosis not present

## 2015-09-29 DIAGNOSIS — M109 Gout, unspecified: Secondary | ICD-10-CM | POA: Diagnosis not present

## 2015-09-29 DIAGNOSIS — J449 Chronic obstructive pulmonary disease, unspecified: Secondary | ICD-10-CM | POA: Diagnosis not present

## 2015-09-29 DIAGNOSIS — Z9181 History of falling: Secondary | ICD-10-CM | POA: Diagnosis not present

## 2015-09-29 DIAGNOSIS — E785 Hyperlipidemia, unspecified: Secondary | ICD-10-CM | POA: Diagnosis not present

## 2015-09-29 DIAGNOSIS — E119 Type 2 diabetes mellitus without complications: Secondary | ICD-10-CM | POA: Diagnosis not present

## 2015-10-04 ENCOUNTER — Encounter: Payer: Self-pay | Admitting: Pulmonary Disease

## 2015-10-04 ENCOUNTER — Ambulatory Visit (INDEPENDENT_AMBULATORY_CARE_PROVIDER_SITE_OTHER)
Admission: RE | Admit: 2015-10-04 | Discharge: 2015-10-04 | Disposition: A | Discharge status: Home / Self Care | Payer: Medicare Other | Source: Ambulatory Visit | Attending: Pulmonary Disease | Admitting: Pulmonary Disease

## 2015-10-04 ENCOUNTER — Ambulatory Visit (INDEPENDENT_AMBULATORY_CARE_PROVIDER_SITE_OTHER): Payer: Medicare Other | Admitting: Pulmonary Disease

## 2015-10-04 ENCOUNTER — Telehealth: Payer: Self-pay | Admitting: Pulmonary Disease

## 2015-10-04 VITALS — BP 118/64 | HR 64 | Ht 65.0 in | Wt 171.0 lb

## 2015-10-04 DIAGNOSIS — J90 Pleural effusion, not elsewhere classified: Secondary | ICD-10-CM | POA: Diagnosis not present

## 2015-10-04 DIAGNOSIS — J441 Chronic obstructive pulmonary disease with (acute) exacerbation: Secondary | ICD-10-CM

## 2015-10-04 DIAGNOSIS — J449 Chronic obstructive pulmonary disease, unspecified: Secondary | ICD-10-CM

## 2015-10-04 NOTE — Telephone Encounter (Signed)
FYI - Order was put in today to transfer pt from [place] to [facility]. Per Lenna Sciara at [facility] -   We tried to switch this pt to [facility] at the end of October, early November. She has capped out with [employer] and will have to stay with them until Medicare will start paying again.   I called [employer] & spoke to [place]. She states pt is in her 56th month with them. She will need to stay with them until 01/2016. BQ saw pt today & told her to return in 3-4 months. Appt was not made when pt left so I gave her appt for 01/20/16 & told dtr order could be put in at that time & we would hold til the next day.

## 2015-10-04 NOTE — Assessment & Plan Note (Signed)
This has been a stable interval for Emily Owen despite her severe COPD. She remains profoundly deconditioned. She does well with Anoro.   Immunizations are up to date.  Plan: Referral to pulmonary rehabilitation Continue bronchodilators as prescribed

## 2015-10-04 NOTE — Patient Instructions (Signed)
Keep taking the Anoro you're doing We will change your prescription for oxygen to [facility] We will call you with the results of today's chest x-ray We will make a referral to pulmonary rehab We will see you back in 3-4 months or sooner if needed
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
## 2015-10-04 NOTE — Assessment & Plan Note (Signed)
This was stable on the most recent chest x-ray. We did try a repeat thoracentesis but on ultrasound the pleural effusion was quite small so we never actually perform the thoracentesis back in September. Today on exam I do not hear a large pleural effusion. I feel that it was likely related to her fall.  Plan: Repeat chest x-ray

## 2015-10-05 NOTE — Telephone Encounter (Signed)
Called and spoke with patient's daughter [name]. I explained to her that we are unable to switch her mother to [facility] until April 2017. She stated that she had spoke to [facility] and she had explained everything. She voiced understanding and had no further questions. Nothing further needed.

## 2015-10-05 NOTE — Progress Notes (Signed)
Quick Note:  LMTCB ______ 

## 2015-10-05 NOTE — Telephone Encounter (Signed)
OK, they were eager to switch to [facility] I guess she has to stay until April then

## 2015-10-06 ENCOUNTER — Telehealth: Payer: Self-pay | Admitting: Pulmonary Disease

## 2015-10-06 NOTE — Telephone Encounter (Signed)
Spoke with Pulmonary Rehab at [facility]. They state the form the received, the wrong dx was chosen. COPD Gold II needs to be checked. I have located this form and checked the correct dx. Emily Owen is going to refax this for me. Nothing further was needed.

## 2015-10-10 NOTE — Progress Notes (Signed)
Quick Note:  Called and spoke to pt. Informed her of the results and recs per BQ. Pt verbalized understanding and denied any further questions or concerns at this time.   ______

## 2015-10-11 ENCOUNTER — Encounter (HOSPITAL_COMMUNITY): Payer: Medicare Other

## 2015-10-25 ENCOUNTER — Encounter (HOSPITAL_COMMUNITY): Payer: Self-pay

## 2015-10-25 ENCOUNTER — Encounter (HOSPITAL_COMMUNITY)
Admission: RE | Admit: 2015-10-25 | Discharge: 2015-10-25 | Disposition: A | Discharge status: Home / Self Care | Payer: Medicare HMO | Source: Ambulatory Visit | Attending: Pulmonary Disease | Admitting: Pulmonary Disease

## 2015-10-25 VITALS — BP 122/64 | HR 80 | Ht 64.0 in | Wt 166.0 lb

## 2015-10-25 DIAGNOSIS — J449 Chronic obstructive pulmonary disease, unspecified: Secondary | ICD-10-CM | POA: Insufficient documentation

## 2015-10-25 DIAGNOSIS — J441 Chronic obstructive pulmonary disease with (acute) exacerbation: Secondary | ICD-10-CM

## 2015-10-25 NOTE — Progress Notes (Signed)
Patient arrived for 1st visit/orientation/education at 65. Patient was referred to CR by Dr. Pennie Banter due to COPD ll (J44.1). During orientation advised patient on arrival and appointment times what to wear, what to do before, during and after exercise. Reviewed attendance and class policy. Talked about inclement weather and class consultation policy. Pt is scheduled to return Cardiac Rehab on 11/01/15 at 1045. Pt was advised to come to class 5 minutes before class starts. He was also given instructions on meeting with the dietician and attending the Family Structure classes. Pt is eager to get started. Patient was able to complete 6 minute walk test. Entrance PHQ 9 score is 0. Patient was measured for the equipment. Discussed equipment safety with patient. Took patient pre-anthropometric measurements. Patient finished visit at 0915.

## 2015-10-25 NOTE — Progress Notes (Signed)
6 MIN NUSTEP TEST  Date: 10/25/2015 Weight: 75.3kg Height: 64"     REST   6-MIN   POST 2-MIN HR    68     80           68 BP            120/60            138/60       120/62 O2    94     94           91 RPE     9     13            9  RPD     9     11            9    Distance: 0.18 miles, 14 [place] Ex METs: 1.80   Patient performed NuStep test instead of walk test due to recent fall and unsteady gait. Patient was able to complete entire 6 min on NuStep. Patient did not complain of pain, SOB, or any other abnormal s/s. Patient used 4L O2 continuous during test.

## 2015-10-25 NOTE — Outcomes Assessment (Signed)
[facility] Baseline Outcomes Assessment   Anthropometrics:  . Height (inches): 64 . Weight (kg): 75.3 . Grip strength was measured using a Dynamometer.  The patient's highest score was a 20.  Functional Status/Exercise Capacity: . Emily Owen had a resting heart rate of 68 BPM, a resting blood pressure of 120/60, and an oxygen saturation of 94 % on 3 liters of O2.  Emily Owen performed a 6-minute NuStep test on 10/25/2015.  The patient completed 0.18 miles in 6 minutes with 0 rest breaks.  This quantifies 1.80 METS.   Dyspnea Measures: . The [facility] is a simple and standardized method of classifying disability in patients with COPD.  The assessment correlates disability and dyspnea.  At entrance the patient scored a 3. The scale is provided below.   0= I only get breathless with strenuous exercise. 1= I get short of breath when hurrying on level ground or walking up a slight incline. 2= On level ground, I walk slower than people of the same age because of breathlessness, or have to stop for breath when walking at my own pace. 3= I stop for breath after walking 100 yards or after a few minutes on level ground. 4=I am too breathless to leave the house or I am breathless when dressing.   . The patient completed the [place] ([facility]).  This questionnaire relates activities of daily living and shortness of breath.  The score ranges from 0-120, a higher score relates to severe shortness of breath during activities of daily living. The patient's score at entrance was 76.  Quality of Life: . Ferrans and Powers Quality of Life Index Pulmonary Version is used to assess the patients satisfaction in different domains of their life; health and functioning, socioeconomic, psychological/spiritual, and family. The overall score is recorded out of 30 points.  The patient's goal is to achieve an overall score of 21 or  higher.  Emily Owen received a 7.60 at entrance.  . The Patient Health Questionnaire (PHQ-2) is a first step approach for the screening of depression.  If the patient scores positive on the PHQ-2 the patient should be further assessed with the PHQ-9.  The Patient Health Questionnaire (PHQ-9) assesses the degree of depression.  Depression is important to monitor and track in pulmonary patients due to its prevalence in the population.  If the patient advances to the PHQ-9 the goal is to score less than 4 on this assessment.  Emily Owen scored a 0 at entrance.  Clinical Assessment Tools: . The COPD Assessment Test (CAT) is a measurement tool to quantify how much of an impact the disease has on the patient's life.  This assessment aids the Pulmonary Rehab Team in designing the patients individualized treatment plan.  A CAT score ranges from 0-40.  A score of 10 or below indicates that COPD has a low impact on the patient's life whereas a score of 30 or higher indicates a severe impact. The patient's goal is a decrease of 1 point from entrance to discharge.  Emily Owen had a CAT score of 29 at entrance.  Nutrition: . The "Rate My Plate" is a dietary assessment that quantifies the balance of a patient's diet.  This tool allows the Pulmonary Rehab Team to key in on the areas of the patient's diet that needs improving.  The team can then focus their nutritional education on those areas.  If the patient scores 24-40, this means there are many ways  they can make their eating habits healthier, 41-57 states that there are some ways they can make their eating habits healthier and a score of 58-72 states that they are making many healthy choices.  The patient's goal is to achieve a score of 49 or higher on this assessment.  Emily Owen scored a 50 at entrance.  Oxygen Compliance: . Patient is currently on 3 liters at rest, 3 liters at night, and 4 liters for exercise.  Emily Owen is not currently using a cpap/bipap at night.  The  patient states that they do not have barriers that keep them from using their oxygen.     Education: . Emily Owen will attend education classes during the course of Pulmonary Rehab.  Education classes that will be offered to the patient are Activities of Daily Living and Energy Conservation, Pursed Lip Breathing and Diaphragmatic Breathing, Nutrition, Exercise for the Pulmonary Patient, Warning Signs of Infection, Chronic Lung Disease, Advanced Directives, Medications, and Stress and Meditation.  The patient completed an assessment at the entrance of the program and will complete it again upon discharge to demonstrate the level of understanding provided by the educational classes.  This assessment includes 14 questions regarding all of the education topics above.  Emily Owen achieved a score of 8/14 at entrance.  Smoking Cessation:  N/A  Exercise: Emily Owen will be provided with an individualized Home Exercise Prescription (HEP) at the entrance of the program.  The patient will be followed by the Pulmonary Exercise Physiologist throughout the program to assist with the progression of the frequency, intensity, time, and type of exercise. The patient's long-term goal is to be exercising 30-60 minutes, 3-5 days per week. At entrance, the patient was exercising 0 days at home.

## 2015-10-25 NOTE — Progress Notes (Signed)
Cardiac/Pulmonary Rehab Medication Review by a Pharmacist  Does the patient  feel that his/her medications are working for him/her?  yes  Has the patient been experiencing any side effects to the medications prescribed?  no  Does the patient measure his/her own blood pressure or blood glucose at home?  yes   Does the patient have any problems obtaining medications due to transportation or finances?   no  Understanding of regimen: good Understanding of indications: good Potential of compliance: good  Questions asked to Determine Patient Understanding of Medication Regimen:  1. What is the name of the medication?  2. What is the medication used for?  3. When should it be taken?  4. How much should be taken?  5. How will you take it?  6. What side effects should you report?  Understanding Defined as: Excellent: All questions above are correct Good: Questions 1-4 are correct Fair: Questions 1-2 are correct  Poor: 1 or none of the above questions are correct   Pharmacist comments: Pt does not report any side effects from medication.  Pt checks BP and blood sugar at home but has misplaced monitor.  No allergies to medications reported.   Hart Robinsons A 10/25/2015 8:43 AM

## 2015-10-25 NOTE — Patient Instructions (Signed)
Pt has finished orientation and is scheduled to return to CR on 11/01/15 at 1045. Pt has been instructed to arrive to class 15 minutes early for scheduled class. Pt has been instructed to wear comfortable clothing and shoes with rubber soles. Pt has been told to take their medications 1 hour prior to coming to class.  If the patient is not going to attend class, she has been instructed to call.

## 2015-11-01 ENCOUNTER — Telehealth: Payer: Self-pay | Admitting: Pulmonary Disease

## 2015-11-01 ENCOUNTER — Encounter (HOSPITAL_COMMUNITY): Payer: Medicare HMO

## 2015-11-01 NOTE — Telephone Encounter (Signed)
Spoke with Dianne at [employer]- states that pt has both copd and emphysema marked on pulm rehab form.  Levander Campion will change this to copd for billing purposes.  Nothing further needed.

## 2015-11-03 ENCOUNTER — Encounter (HOSPITAL_COMMUNITY)
Admission: RE | Admit: 2015-11-03 | Discharge: 2015-11-03 | Disposition: A | Discharge status: Home / Self Care | Payer: Medicare HMO | Source: Ambulatory Visit | Attending: Pulmonary Disease | Admitting: Pulmonary Disease

## 2015-11-03 DIAGNOSIS — J449 Chronic obstructive pulmonary disease, unspecified: Secondary | ICD-10-CM | POA: Diagnosis not present

## 2015-11-08 ENCOUNTER — Encounter (HOSPITAL_COMMUNITY)
Admission: RE | Admit: 2015-11-08 | Discharge: 2015-11-08 | Disposition: A | Discharge status: Home / Self Care | Payer: Medicare HMO | Source: Ambulatory Visit | Attending: Pulmonary Disease | Admitting: Pulmonary Disease

## 2015-11-08 DIAGNOSIS — J449 Chronic obstructive pulmonary disease, unspecified: Secondary | ICD-10-CM | POA: Diagnosis not present

## 2015-11-10 ENCOUNTER — Encounter (HOSPITAL_COMMUNITY)
Admission: RE | Admit: 2015-11-10 | Discharge: 2015-11-10 | Disposition: A | Discharge status: Home / Self Care | Payer: Medicare HMO | Source: Ambulatory Visit | Attending: Pulmonary Disease | Admitting: Pulmonary Disease

## 2015-11-10 DIAGNOSIS — J449 Chronic obstructive pulmonary disease, unspecified: Secondary | ICD-10-CM | POA: Diagnosis not present

## 2015-11-11 NOTE — Progress Notes (Signed)
Cardiac Rehabilitation Program Outcomes Report   Orientation:  10/25/15 Graduate Date:  tbd Discharge Date:  tbd # of sessions completed: 3  Pulmonologist: McQuiad Family MD:  Milus Mallick Time:  [id number]  A.  Exercise Program:  Tolerates exercise @ 3.52 METS for 15 minutes and Walk Test Results:  Pre: 1.80  B.  Mental Health:  Good mental attitude and PHQ-9: 0  C.  Education/Instruction/Skills  Uses Perceived Exertion Scale and/or Dyspnea Scale  Demonstrates accurate pursed lip breathing  D.  Nutrition/Weight Control/Body Composition:  Adherence to prescribed nutrition program: fair    E.  Blood Lipids   No results found for: CHOL, HDL, LDLCALC, LDLDIRECT, TRIG, CHOLHDL  F.  Lifestyle Changes:  Making positive lifestyle changes and Not smoking:  Quit 1995  G.  Symptoms noted with exercise:  Asymptomatic  Report Completed By:  Stevphen Rochester RN   Comments:  This is the patients first week progress note for [facility].

## 2015-11-11 NOTE — Progress Notes (Addendum)
Emily Owen 82 y.o. female  Initial Psychosocial Assessment  Pt psychosocial assessment reveals pt lives with their daughter. Pt is currently retired. Pt hobbies include reading. Pt reports his stress level is moderate. Areas of stress/anxiety include Health.  Pt does not exhibit signs of depression. Signs of depression include none.  Pt shows good  coping skills with positive outlook . Staff Offered emotional support and reassurance. Monitor and evaluate progress toward psychosocial goal(s).  Goal(s): Help patient work toward returning to meaningful activities that improve patient's QOL and are attainable with patient's lung disease Breathe better Walk better   11/11/2015 1:59 PM

## 2015-11-15 ENCOUNTER — Encounter (HOSPITAL_COMMUNITY)
Admission: RE | Admit: 2015-11-15 | Discharge: 2015-11-15 | Disposition: A | Discharge status: Home / Self Care | Payer: Medicare HMO | Source: Ambulatory Visit | Attending: Pulmonary Disease | Admitting: Pulmonary Disease

## 2015-11-15 DIAGNOSIS — J449 Chronic obstructive pulmonary disease, unspecified: Secondary | ICD-10-CM | POA: Diagnosis not present

## 2015-11-17 ENCOUNTER — Encounter (HOSPITAL_COMMUNITY)
Admission: RE | Admit: 2015-11-17 | Discharge: 2015-11-17 | Disposition: A | Discharge status: Home / Self Care | Payer: Medicare HMO | Source: Ambulatory Visit | Attending: Pulmonary Disease | Admitting: Pulmonary Disease

## 2015-11-17 DIAGNOSIS — J449 Chronic obstructive pulmonary disease, unspecified: Secondary | ICD-10-CM | POA: Diagnosis not present

## 2015-11-22 ENCOUNTER — Encounter (HOSPITAL_COMMUNITY): Payer: Medicare HMO

## 2015-11-24 ENCOUNTER — Encounter (HOSPITAL_COMMUNITY): Payer: Medicare HMO

## 2015-11-28 ENCOUNTER — Encounter: Payer: Self-pay | Admitting: Pulmonary Disease

## 2015-11-28 NOTE — Telephone Encounter (Signed)
[name] please advise on patient e-mail attached.

## 2015-11-29 ENCOUNTER — Encounter (HOSPITAL_COMMUNITY): Payer: Medicare HMO

## 2015-11-30 NOTE — Progress Notes (Signed)
Emily Owen 82 y.o. female  30 day  Psychosocial Note  Patient psychosocial assessment reveals a possible barrier to participation in Pulmonary Rehab which appears to be lack of motivation . Psychosocial areas that are currently affecting patient's rehab experience include concerns about family and health which patient rates a 4 on stress level.  Patient somewhat continue to exhibit positive coping skills to deal with her psychosocial concerns. Offered emotional support and reassurance. Patient does feel she is making progress toward Pulmonary Rehab goals. Patient reports her health and activity level has not improved in the past 30 days as evidenced by patient's report of unchanged ability to do more things.  Patient is also inconsistent with visits.  She has only been to 5 sessions in last 30 days. Patient reports feeling positive about current and projected progression in Pulmonary Rehab. After reviewing the patient's treatment plan, the patient is making progress toward Pulmonary Rehab goals. Patient's rate of progress toward rehab goals is fair. Plan of action to help patient continue to work towards rehab goals include encouragement, support and education. Will continue to monitor and evaluate progress toward psychosocial goal(s).  Goal(s) in progress: Improved management of stress Improved coping skills Help patient work toward returning to meaningful activities that improve patient's QOL and are attainable with patient's lung disease Breathe better and walk better

## 2015-12-01 ENCOUNTER — Encounter (HOSPITAL_COMMUNITY)
Admission: RE | Admit: 2015-12-01 | Discharge: 2015-12-01 | Disposition: A | Discharge status: Home / Self Care | Payer: Medicare HMO | Source: Ambulatory Visit | Attending: Pulmonary Disease | Admitting: Pulmonary Disease

## 2015-12-01 DIAGNOSIS — J449 Chronic obstructive pulmonary disease, unspecified: Secondary | ICD-10-CM | POA: Insufficient documentation

## 2015-12-06 ENCOUNTER — Encounter (HOSPITAL_COMMUNITY): Payer: Medicare HMO

## 2015-12-06 NOTE — Telephone Encounter (Signed)
Replied to patient with options. Will await response

## 2015-12-08 ENCOUNTER — Encounter (HOSPITAL_COMMUNITY): Payer: Medicare HMO

## 2015-12-09 ENCOUNTER — Telehealth: Payer: Self-pay | Admitting: Family Medicine

## 2015-12-09 NOTE — Telephone Encounter (Signed)
I let [name] know Dr.Aron's not taking new patients.

## 2015-12-09 NOTE — Telephone Encounter (Signed)
Floy Sabina called.  Dr.Aron sees Emily Owen and her daughters, Wayne Both and Godley.  Emily's mother moved to the area last year. Her mother was seeing someone in [place], but her mother isn't happy at that office. Her mother has COPD and sees Dr.McQuaid.  Emily Owen is asking if Dr.Aron will take her mother as a new patient. Emily Owen said she'll make an appointment at the [facility] office, if Dr.Aron can't see her mother.  Please advise.

## 2015-12-09 NOTE — Telephone Encounter (Signed)
I am unfortunately not accepting new patients at this time.

## 2015-12-13 ENCOUNTER — Encounter (HOSPITAL_COMMUNITY): Payer: Medicare HMO

## 2015-12-15 ENCOUNTER — Encounter (HOSPITAL_COMMUNITY): Payer: Medicare HMO

## 2015-12-20 ENCOUNTER — Telehealth: Payer: Self-pay | Admitting: Pulmonary Disease

## 2015-12-20 ENCOUNTER — Encounter (HOSPITAL_COMMUNITY): Payer: Medicare HMO

## 2015-12-20 NOTE — Telephone Encounter (Signed)
Received message alert stating that the patient e-mail was not read by the patient.  Message was regarding her Anoro being too expensive and wanting to know if there were any alternatives for this or something cheaper. Called and spoke with the patient and she stated that her daughter has been the one communicating through her MyChart account.  Discussed below with the patient and she states that she is not wanting to make any changes at this time and states that she will bring her formulary to her OV 3/31 with BQ.   RE: Non-Urgent Medical Question    From  Virl Cagey, CMA   To  [place]  12/06/2015 5:32 PM     Good Evening Ms Emily Owen,   [name] has given a few recommendations for you. He said out options are:    (1) Switch to Stiolto 2 puffs daily - new inhaler    (2) Pulmicort + Brovana (BID only nebulized, cheaper, through DME, etc) - nebulizer meds, sometimes cheaper but will come through a home care company   (3)Or forumlary alternative (which I would need to pick from her insurance formulary).   If you have a drug formulary that you could bring by the office to review, this will help [place] in changing your medication to the appropriate alternative. Please let us know out of these options which you would like to do.   --[place] Pulmonary Nurse    Nothing further needed.

## 2015-12-20 NOTE — Outcomes Assessment (Signed)
Note created 12/20/2015 12:45                                    [facility] Pulmonary Rehabilitation                                                             Final/Discharge Outcome Results  Anthropometrics: . Height (inches): 64 . Weight (kg): 76.7 ? This is a change of 1.4kg from entrance.   Functional Status/Exercise Capacity: . Jennett had a resting heart rate of 60 BPM, a resting blood pressure of 112/60, and an oxygen saturation of 94 % on 3 liters of O2.     Oxygen Compliance: . Patient is currently on 3-4 liters at rest, 3-4 liters at night, and 3-4 liters for exercise.  Niki is currently not  using cpap/bipap at night.  The patient states that they do not have barriers that keep them from using their oxygen.    ? This is a no change result from entrance.    Education: ? Jouri attended 3/13 education classes.     Smoking Cessation: N/A  Exercise: . Danuta was provided with an individualized Home Exercise Prescription (HEP) at the entrance of the program.    Patient stopped and was discharged from program after completing 6 sessions due to health. Further final/discharge outcome results are not applicable due to abrupt discharge from program after 6 sessions.

## 2015-12-20 NOTE — Addendum Note (Signed)
Encounter addended by: Zada Girt, CCT on: 12/20/2015 12:58 PM<BR>     Documentation filed: Clinical Notes

## 2015-12-22 ENCOUNTER — Encounter (HOSPITAL_COMMUNITY): Payer: Medicare HMO

## 2015-12-27 ENCOUNTER — Encounter (HOSPITAL_COMMUNITY): Payer: Medicare HMO

## 2015-12-29 ENCOUNTER — Encounter (HOSPITAL_COMMUNITY): Payer: Medicare HMO

## 2015-12-30 NOTE — Progress Notes (Signed)
11/24/15-Patient stated she is quitting the [facility] program due to health. Patients last visit was 11/17/15

## 2015-12-30 NOTE — Addendum Note (Signed)
Encounter addended by: Cathie Olden, RN on: 12/30/2015  7:57 AM<BR>     Documentation filed: Notes Section

## 2016-01-03 ENCOUNTER — Encounter (HOSPITAL_COMMUNITY): Payer: Medicare HMO

## 2016-01-05 ENCOUNTER — Encounter (HOSPITAL_COMMUNITY): Payer: Medicare HMO

## 2016-01-10 ENCOUNTER — Encounter (HOSPITAL_COMMUNITY): Payer: Medicare HMO

## 2016-01-12 ENCOUNTER — Encounter (HOSPITAL_COMMUNITY): Payer: Medicare HMO

## 2016-01-17 ENCOUNTER — Encounter (HOSPITAL_COMMUNITY): Payer: Medicare HMO

## 2016-01-19 ENCOUNTER — Encounter (HOSPITAL_COMMUNITY): Payer: Medicare HMO

## 2016-01-20 ENCOUNTER — Encounter: Payer: Self-pay | Admitting: Pulmonary Disease

## 2016-01-20 ENCOUNTER — Ambulatory Visit (INDEPENDENT_AMBULATORY_CARE_PROVIDER_SITE_OTHER): Payer: Medicare HMO | Admitting: Pulmonary Disease

## 2016-01-20 VITALS — BP 132/64 | HR 60 | Ht 65.0 in | Wt 167.0 lb

## 2016-01-20 DIAGNOSIS — J449 Chronic obstructive pulmonary disease, unspecified: Secondary | ICD-10-CM | POA: Diagnosis not present

## 2016-01-20 DIAGNOSIS — J9612 Chronic respiratory failure with hypercapnia: Secondary | ICD-10-CM | POA: Diagnosis not present

## 2016-01-20 NOTE — Progress Notes (Signed)
Subjective:    Patient ID: Emily Owen, female    DOB: 27-May-1930, 82 y.o.   MRN: [id number]  Synopsis: Referred in 2016 for severe COPD and evaluation of a pleural effusion after an episode of pneumonia. June 2016 pulmonary function testing ratio 49%, FEV1 0.85 L (44% predicted, 9% change postbronchodilator), total lung capacity 5.33 L (103% predicted), DLCO 12.21 (48% predicted)  HPI Chief Complaint  Patient presents with  . Follow-up    pt doing well- no breathing complaints today.     Segen says her breathing has been OK. She could only keep up with her pulmonary rehab for about a month due to financial reasons. She says she hasn't had any exacerbations of her COPD. She has been doing some exercises at home. She is on 3 L at rest, 4 L on exertion. Some cough.   Past Medical History  Diagnosis Date  . COPD (chronic obstructive pulmonary disease) ([place])   . Osteoporosis   . Cancer ([place])     colon  . Cancer of cervix ([place])     cervix  . Gout   . Gallstones   . Hard of hearing   . Pleural effusion       Review of Systems  Constitutional: Negative for fever, chills and fatigue.  HENT: Negative for postnasal drip, rhinorrhea and sinus pressure.   Respiratory: Negative for cough, shortness of breath and wheezing.   Cardiovascular: Negative for chest pain, palpitations and leg swelling.       Objective:   Physical Exam Filed Vitals:   01/20/16 0957  BP: 132/64  Pulse: 60  Height: 5\' 5"  (1.651 m)  Weight: 167 lb (75.751 kg)  SpO2: 90%    3 L O2  Gen: well appearing HENT: OP clear, NCAT PULM: Few crackles bases, normal effort CV: RRR, no mgr, trace edema GI: BS+, soft, nontender Derm: no cyanosis or rash Psyche: normal mood and affect       Assessment & Plan:  COPD, moderate This has been a stable interval for [place]. She is frustrated by the cost of her medications but they have been controlling her COPD well and relatively speaking her  cost is about what's expected these days if not on the low end. Immunizations are up-to-date.  Plan: Stay active, participate in exercises at a gym Continue bronchodilators as prescribed Follow-up 6 months or sooner if needed  Chronic respiratory failure with hypercapnia Continue 3 L of oxygen at rest and 4 L on exertion.     Current outpatient prescriptions:  .  aspirin EC 81 MG tablet, Take 81 mg by mouth daily., Disp: , Rfl:  .  Cholecalciferol (VITAMIN D) 2000 UNITS CAPS, Take 2,000 Units by mouth daily., Disp: , Rfl:  .  fish oil-omega-3 fatty acids 1000 MG capsule, Take 2 g by mouth 2 (two) times daily., Disp: , Rfl:  .  guaiFENesin (MUCINEX) 600 MG 12 hr tablet, Take 2 tablets (1,200 mg total) by mouth 2 (two) times daily. (Patient taking differently: Take 1,200 mg by mouth 2 (two) times daily as needed. ), Disp: 10 tablet, Rfl: 0 .  Ipratropium-Albuterol (COMBIVENT RESPIMAT) 20-100 MCG/ACT AERS respimat, Inhale 1 puff into the lungs every 6 (six) hours as needed for wheezing. , Disp: , Rfl:  .  Multiple Vitamins-Minerals (PRESERVISION AREDS PO), Take 1 capsule by mouth 2 (two) times daily., Disp: , Rfl:  .  rosuvastatin (CRESTOR) 20 MG tablet, Take 20 mg by mouth at bedtime., Disp: ,  Rfl:  .  Umeclidinium-Vilanterol 62.5-25 MCG/INH AEPB, Inhale 1 puff into the lungs daily., Disp: 60 each, Rfl: 5

## 2016-01-20 NOTE — Patient Instructions (Signed)
Keep using your oxygen and inhaled medicines as you're doing Follow-up in 6 months or sooner if needed

## 2016-01-20 NOTE — Assessment & Plan Note (Signed)
This has been a stable interval for [facility]. She is frustrated by the cost of her medications but they have been controlling her COPD well and relatively speaking her cost is about what's expected these days if not on the low end. Immunizations are up-to-date.  Plan: Stay active, participate in exercises at a gym Continue bronchodilators as prescribed Follow-up 6 months or sooner if needed

## 2016-01-20 NOTE — Assessment & Plan Note (Signed)
Continue 3 L of oxygen at rest and 4 L on exertion.

## 2016-03-09 ENCOUNTER — Encounter: Payer: Self-pay | Admitting: Pulmonary Disease

## 2016-03-09 NOTE — Telephone Encounter (Signed)
Called and spoke with pt's daughter. Pt will need to come in for an appointment to do another walk test. Walk test from March is no longer sufficient. Pt has been scheduled with SG on 03/14/16 at 11:30 (per the pt's daughter request). Nothing further was needed at this time.

## 2016-03-14 ENCOUNTER — Other Ambulatory Visit (INDEPENDENT_AMBULATORY_CARE_PROVIDER_SITE_OTHER): Payer: Medicare HMO

## 2016-03-14 ENCOUNTER — Encounter: Payer: Self-pay | Admitting: Internal Medicine

## 2016-03-14 ENCOUNTER — Ambulatory Visit (INDEPENDENT_AMBULATORY_CARE_PROVIDER_SITE_OTHER): Payer: Medicare HMO | Admitting: Acute Care

## 2016-03-14 ENCOUNTER — Ambulatory Visit (INDEPENDENT_AMBULATORY_CARE_PROVIDER_SITE_OTHER): Payer: Medicare HMO | Admitting: Internal Medicine

## 2016-03-14 ENCOUNTER — Encounter: Payer: Self-pay | Admitting: Acute Care

## 2016-03-14 VITALS — BP 122/62 | HR 68 | Ht 65.0 in | Wt 174.0 lb

## 2016-03-14 VITALS — BP 112/78 | HR 64 | Temp 98.2°F | Resp 18 | Ht 64.0 in | Wt 175.0 lb

## 2016-03-14 DIAGNOSIS — E119 Type 2 diabetes mellitus without complications: Secondary | ICD-10-CM | POA: Diagnosis not present

## 2016-03-14 DIAGNOSIS — E785 Hyperlipidemia, unspecified: Secondary | ICD-10-CM

## 2016-03-14 DIAGNOSIS — J449 Chronic obstructive pulmonary disease, unspecified: Secondary | ICD-10-CM | POA: Diagnosis not present

## 2016-03-14 DIAGNOSIS — E1151 Type 2 diabetes mellitus with diabetic peripheral angiopathy without gangrene: Secondary | ICD-10-CM | POA: Diagnosis not present

## 2016-03-14 DIAGNOSIS — R69 Illness, unspecified: Secondary | ICD-10-CM | POA: Diagnosis not present

## 2016-03-14 DIAGNOSIS — M109 Gout, unspecified: Secondary | ICD-10-CM | POA: Diagnosis not present

## 2016-03-14 DIAGNOSIS — I10 Essential (primary) hypertension: Secondary | ICD-10-CM | POA: Diagnosis not present

## 2016-03-14 LAB — CBC
HCT: 37 % (ref 36.0–46.0)
Hemoglobin: 11.8 g/dL — ABNORMAL LOW (ref 12.0–15.0)
MCHC: 31.8 g/dL (ref 30.0–36.0)
MCV: 80.1 fl (ref 78.0–100.0)
Platelets: 295 10*3/uL (ref 150.0–400.0)
RBC: 4.62 Mil/uL (ref 3.87–5.11)
RDW: 17.3 % — AB (ref 11.5–15.5)
WBC: 8.7 10*3/uL (ref 4.0–10.5)

## 2016-03-14 LAB — COMPREHENSIVE METABOLIC PANEL
ALT: 7 U/L (ref 0–35)
AST: 11 U/L (ref 0–37)
Albumin: 4.1 g/dL (ref 3.5–5.2)
Alkaline Phosphatase: 80 U/L (ref 39–117)
BILIRUBIN TOTAL: 0.4 mg/dL (ref 0.2–1.2)
BUN: 25 mg/dL — AB (ref 6–23)
CHLORIDE: 98 meq/L (ref 96–112)
CO2: 39 meq/L — AB (ref 19–32)
CREATININE: 0.96 mg/dL (ref 0.40–1.20)
Calcium: 9.8 mg/dL (ref 8.4–10.5)
GFR: 58.91 mL/min — ABNORMAL LOW (ref >60.00)
Glucose, Bld: 153 mg/dL — ABNORMAL HIGH (ref 70–99)
Potassium: 5.1 mEq/L (ref 3.5–5.1)
SODIUM: 141 meq/L (ref 135–145)
Total Protein: 7.2 g/dL (ref 6.0–8.3)

## 2016-03-14 LAB — LIPID PANEL
CHOL/HDL RATIO: 7
Cholesterol: 223 mg/dL — ABNORMAL HIGH (ref 0–200)
HDL: 29.7 mg/dL — AB (ref >39.00)
NONHDL: 192.87
Triglycerides: 346 mg/dL — ABNORMAL HIGH (ref 0.0–149.0)
VLDL: 69.2 mg/dL — ABNORMAL HIGH (ref 0.0–40.0)

## 2016-03-14 LAB — LDL CHOLESTEROL, DIRECT: Direct LDL: 132 mg/dL

## 2016-03-14 LAB — HEMOGLOBIN A1C: Hgb A1c MFr Bld: 6.6 % — ABNORMAL HIGH (ref 4.6–6.5)

## 2016-03-14 MED ORDER — COLCHICINE 0.6 MG PO TABS: 0.6000 mg | ORAL_TABLET | Freq: Every day | ORAL | Status: DC

## 2016-03-14 MED ORDER — ROSUVASTATIN CALCIUM 20 MG PO TABS: 20.0000 mg | ORAL_TABLET | Freq: Every day | ORAL | Status: DC

## 2016-03-14 MED ORDER — GLUCOSE BLOOD VI STRP: ORAL_STRIP | Status: DC

## 2016-03-14 NOTE — Patient Instructions (Signed)
We have sent in the colchicine for the gout that you can take 1 pill daily when you have gout.   We are checking the blood work today and will send the results on mychart.   Diabetes and Standards of Medical Care Diabetes is complicated. You may find that your diabetes team includes a dietitian, nurse, diabetes educator, eye doctor, and more. To help everyone know what is going on and to help you get the care you deserve, the following schedule of care was developed to help keep you on track. Below are the tests, exams, vaccines, medicines, education, and plans you will need. HbA1c test This test shows how well you have controlled your glucose over the past 2-3 months. It is used to see if your diabetes management plan needs to be adjusted.   It is performed at least 2 times a year if you are meeting treatment goals.  It is performed 4 times a year if therapy has changed or if you are not meeting treatment goals. Blood pressure test  This test is performed at every routine medical visit. The goal is less than 140/90 mm Hg for most people, but 130/80 mm Hg in some cases. Ask your health care provider about your goal. Dental exam  Follow up with the dentist regularly. Eye exam  If you are diagnosed with type 1 diabetes as a child, get an exam upon reaching the age of 32 years or older and having had diabetes for 3-5 years. Yearly eye exams are recommended after that initial eye exam.  If you are diagnosed with type 1 diabetes as an adult, get an exam within 5 years of diagnosis and then yearly.  If you are diagnosed with type 2 diabetes, get an exam as soon as possible after the diagnosis and then yearly. Foot care exam  Visual foot exams are performed at every routine medical visit. The exams check for cuts, injuries, or other problems with the feet.  You should have a complete foot exam performed every year. This exam includes an inspection of the structure and skin of your feet, a check  of the pulses in your feet, and a check of the sensation in your feet.  Type 1 diabetes: The first exam is performed 5 years after diagnosis.  Type 2 diabetes: The first exam is performed at the time of diagnosis.  Check your feet nightly for cuts, injuries, or other problems with your feet. Tell your health care provider if anything is not healing. Kidney function test (urine microalbumin)  This test is performed once a year.  Type 1 diabetes: The first test is performed 5 years after diagnosis.  Type 2 diabetes: The first test is performed at the time of diagnosis.  A serum creatinine and estimated glomerular filtration rate (eGFR) test is done once a year to assess the level of chronic kidney disease (CKD), if present. Lipid profile (cholesterol, HDL, LDL, triglycerides)  Performed every 5 years for most people.  The goal for LDL is less than 100 mg/dL. If you are at high risk, the goal is less than 70 mg/dL.  The goal for HDL is 40 mg/dL-50 mg/dL for men and 50 mg/dL-60 mg/dL for women. An HDL cholesterol of 60 mg/dL or higher gives some protection against heart disease.  The goal for triglycerides is less than 150 mg/dL. Immunizations  The flu (influenza) vaccine is recommended yearly for every person 71 months of age or older who has diabetes.  The pneumonia (pneumococcal)  vaccine is recommended for every person 70 years of age or older who has diabetes. Adults 1 years of age or older may receive the pneumonia vaccine as a series of two separate shots.  The hepatitis B vaccine is recommended for adults shortly after they have been diagnosed with diabetes.  The Tdap (tetanus, diphtheria, and pertussis) vaccine should be given:  According to normal childhood vaccination schedules, for children.  Every 10 years, for adults who have diabetes. Diabetes self-management education  Education is recommended at diagnosis and ongoing as needed. Treatment plan  Your treatment plan  is reviewed at every medical visit.   This information is not intended to replace advice given to you by your health care provider. Make sure you discuss any questions you have with your health care provider.   Document Released: 08/05/2009 Document Revised: 10/29/2014 Document Reviewed: 03/10/2013 Elsevier Interactive Patient Education [employer].

## 2016-03-14 NOTE — Assessment & Plan Note (Signed)
Not on meds and BP at goal today. Monitor and adjust as needed.

## 2016-03-14 NOTE — Patient Instructions (Addendum)
It is nice to meet you today. You qualified for your oxygen today with your walk. We will send the results into [facility]. We will renew your Anoro 1 puff daily. We will renew your Combivent inhaler. Remember to rinse your mouth after use. Follow up with Dr. Lake Bells in Sept., 2017. Please schedule if his schedule is open. Please contact office for sooner follow up if symptoms do not improve or worsen or seek emergency care

## 2016-03-14 NOTE — Assessment & Plan Note (Signed)
Checking lipid panel and adjust as needed. On crestor 20 mg daily, no side effects.

## 2016-03-14 NOTE — Assessment & Plan Note (Addendum)
Complicated by vascular disease (PVD) which is stable. She is not taking meds at this time. Last HgA1c in our records is 7.6. Goal <8. Is not on ACE-I. Taking statin. Foot exam done today, eye exam yearly reminder.

## 2016-03-14 NOTE — Progress Notes (Signed)
   Subjective:    Patient ID: Emily Owen, female    DOB: 1930/04/04, 82 y.o.   MRN: [id number]  HPI The patient is an 82 YO female coming in new with toe pain. She has had gout in the past. It started hurting about 1-2 weeks ago. She has been taking tylenol for it and it was getting better. Now about 70% better and she is able to walk on it without pain.   Diagnosed with diabetes in 1995, no eye problems or numbness. Has been on medicine for it in the past but not at this time. She was advised to take insulin after hospitalization last August due to being on steroids but she did not. She has done a lot of courses of steroids in the past and she was not sure if that was related to when her diabetes was diagnosed. Did have a meter but does not for some time to check her sugars.   Please see A/P for status and treatment of other current medical problems.   PMH, [facility], social history reviewed and updated.   Review of Systems  Constitutional: Positive for activity change and fatigue. Negative for fever, chills, appetite change and unexpected weight change.  HENT: Negative.   Eyes: Negative.   Respiratory: Positive for shortness of breath. Negative for cough and chest tightness.   Cardiovascular: Negative for chest pain, palpitations and leg swelling.  Gastrointestinal: Positive for diarrhea. Negative for nausea, abdominal pain, constipation and abdominal distention.  Musculoskeletal: Positive for arthralgias and gait problem. Negative for myalgias and back pain.  Skin: Negative.   Neurological: Negative.   Psychiatric/Behavioral: Negative.       Objective:   Physical Exam  Constitutional: She is oriented to person, place, and time. She appears well-developed and well-nourished.  Overweight  HENT:  Head: Normocephalic and atraumatic.  Eyes: EOM are normal.  Neck: Normal range of motion.  Cardiovascular: Normal rate and regular rhythm.   Pulmonary/Chest: Effort normal. No  respiratory distress. She has no wheezes.  Effusion heard in right lower lung, no overt wheezing  Abdominal: Soft. Bowel sounds are normal. She exhibits no distension. There is no tenderness. There is no rebound.  Musculoskeletal: She exhibits no edema.  Neurological: She is alert and oriented to person, place, and time.  Skin: Skin is warm and dry.  Left great toe with redness at the MTP joint  Psychiatric: She has a normal mood and affect.   Filed Vitals:   03/14/16 0902  BP: 112/78  Pulse: 64  Temp: 98.2 F (36.8 C)  TempSrc: Oral  Resp: 18  Height: 5\' 4"  (1.626 m)  Weight: 175 lb (79.379 kg)  SpO2: 90%      Assessment & Plan:

## 2016-03-14 NOTE — Progress Notes (Signed)
Pre visit review using our clinic review tool, if applicable. No additional management support is needed unless otherwise documented below in the visit note. 

## 2016-03-14 NOTE — Assessment & Plan Note (Signed)
Rx for colchicine daily with adequate renal function. Declines long term treatment at this time and states it has been years since last flare.

## 2016-03-14 NOTE — Assessment & Plan Note (Signed)
Patient presents to the office today for qualifying walk for her oxygen. She is at her baseline in regard to her dyspnea, and is doing well. Plan: You qualified for your oxygen today with your walk. We will send the results into [facility]. We will renew your Anoro 1 puff daily. We will renew your Combivent inhaler. Remember to rinse your mouth after use. Follow up with Dr. Lake Bells in Sept., 2017. Please schedule if his schedule is open. Please contact office for sooner follow up if symptoms do not improve or worsen or seek emergency care

## 2016-03-14 NOTE — Progress Notes (Signed)
History of Present Illness Emily Owen is a 82 y.o. female  Referred in 2016 for severe COPD and evaluation of a pleural effusion after an episode of pneumonia.She is seen by Dr. Lake Bells.  June 2016 pulmonary function testing ratio 49%, FEV1 0.85 L (44% predicted, 9% change postbronchodilator), total lung capacity 5.33 L (103% predicted), DLCO 12.21 (48% predicted)   03/14/2016 OV for Qualifying walk. Patient presents to the office today for qualifying walk for continued oxygen use. She was walked and she desaturated on room air to 84%. She re bounded to 96% when placed  on her oxygen at 3 L. She has maintained her saturations at 96% on her 3 L of oxygen. She is otherwise at her baseline regarding her dyspnea, she denies fever, chest pain, increase in secretions, cough and has no complaint of leg or calf pain, or lower extremity edema. She is compliant with her maintenance medication, and nor 01 puff daily. She is using her Combivent inhaler as rescue. Immunizations are up to date  Tests: Patient Saturations on 4(pulse) Liters of oxygen while Ambulating = 94% Patient Saturations on 3 Liters of oxygen while Ambulating = 96% SATURATION QUALIFICATIONS: (This note is used to comply with regulatory documentation for home oxygen) Maryanna Shape, [place] 03/14/16 Patient Saturations on Room Air at Rest = 86%  Past medical hx Past Medical History  Diagnosis Date  . COPD (chronic obstructive pulmonary disease) ([place])   . Osteoporosis   . Cancer ([place])     colon  . Cancer of cervix ([place])     cervix  . Gout   . Gallstones   . Hard of hearing   . Pleural effusion      Past surgical hx, Family hx, Social hx all reviewed.  Current Outpatient Prescriptions on File Prior to Visit  Medication Sig  . aspirin EC 81 MG tablet Take 81 mg by mouth daily.  . Cholecalciferol (VITAMIN D) 2000 UNITS CAPS Take 2,000 Units by mouth daily.  . colchicine 0.6 MG tablet Take 1 tablet (0.6 mg total) by  mouth daily.  . fish oil-omega-3 fatty acids 1000 MG capsule Take 2 g by mouth 2 (two) times daily.  Marland Kitchen glucose blood (BAYER CONTOUR NEXT TEST) test strip Use as instructed  . guaiFENesin (MUCINEX) 600 MG 12 hr tablet Take 2 tablets (1,200 mg total) by mouth 2 (two) times daily. (Patient taking differently: Take 1,200 mg by mouth 2 (two) times daily as needed. )  . Ipratropium-Albuterol (COMBIVENT RESPIMAT) 20-100 MCG/ACT AERS respimat Inhale 1 puff into the lungs every 6 (six) hours as needed for wheezing.   . Multiple Vitamins-Minerals (PRESERVISION AREDS PO) Take 1 capsule by mouth 2 (two) times daily.  . rosuvastatin (CRESTOR) 20 MG tablet Take 1 tablet (20 mg total) by mouth at bedtime.  Marland Kitchen Umeclidinium-Vilanterol 62.5-25 MCG/INH AEPB Inhale 1 puff into the lungs daily.   No current facility-administered medications on file prior to visit.     No Known Allergies  Review Of Systems:  Constitutional:   No  weight loss, night sweats,  Fevers, chills, fatigue, or  lassitude.  HEENT:   No headaches,  Difficulty swallowing,  Tooth/dental problems, or  Sore throat,                No sneezing, itching, ear ache, nasal congestion, post nasal drip,   CV:  No chest pain,  Orthopnea, PND, swelling in lower extremities, anasarca, dizziness, palpitations, syncope.   GI  No heartburn, indigestion,  abdominal pain, nausea, vomiting, diarrhea, change in bowel habits, loss of appetite, bloody stools.   Resp: + shortness of breath with exertion and  at rest.  No excess mucus, no productive cough,  No non-productive cough,  No coughing up of blood.  No change in color of mucus.  No wheezing.  No chest wall deformity  Skin: no rash or lesions.  GU: no dysuria, change in color of urine, no urgency or frequency.  No flank pain, no hematuria   MS:  No joint pain or swelling.  No decreased range of motion.  No back pain.  Psych:  No change in mood or affect. No depression or anxiety.  No memory  loss.   Vital Signs BP 122/62 mmHg  Pulse 68  Ht 5\' 5"  (1.651 m)  Wt 174 lb (78.926 kg)  BMI 28.96 kg/m2  SpO2 96%   Physical Exam:  General- No distress,  A&Ox3, wearing oxygen per [facility]: No sinus tenderness, TM clear, pale nasal mucosa, no oral exudate,no post nasal drip, no LAN Cardiac: S1, S2, regular rate and rhythm, no murmur Chest: No wheeze/ rales/ dullness; no accessory muscle use, no nasal flaring, no sternal retractions Abd.: Soft Non-tender Ext: No clubbing cyanosis, edema Neuro:  normal strength Skin: No rashes, warm and dry Psych: normal mood and behavior   Assessment/Plan  COPD, moderate Patient presents to the office today for qualifying walk for her oxygen. She is at her baseline in regard to her dyspnea, and is doing well. Plan: You qualified for your oxygen today with your walk. We will send the results into [place]. We will renew your Anoro 1 puff daily. We will renew your Combivent inhaler. Remember to rinse your mouth after use. Follow up with Dr. Lake Bells in Sept., 2017. Please schedule if his schedule is open. Please contact office for sooner follow up if symptoms do not improve or worsen or seek emergency care        Magdalen Spatz, NP 03/14/2016  12:45 PM

## 2016-03-15 MED ORDER — AMLODIPINE BESYLATE 5 MG PO TABS: 5.0000 mg | ORAL_TABLET | Freq: Every day | ORAL | Status: DC

## 2016-03-15 NOTE — Progress Notes (Signed)
Reviewed, I agree with this plan of care 

## 2016-03-20 ENCOUNTER — Encounter: Payer: Self-pay | Admitting: Acute Care

## 2016-03-20 ENCOUNTER — Encounter: Payer: Self-pay | Admitting: Pulmonary Disease

## 2016-03-20 DIAGNOSIS — J449 Chronic obstructive pulmonary disease, unspecified: Secondary | ICD-10-CM

## 2016-03-20 NOTE — Telephone Encounter (Signed)
This is a multiple message. Message will closed.

## 2016-03-21 ENCOUNTER — Telehealth: Payer: Self-pay | Admitting: Pulmonary Disease

## 2016-03-21 DIAGNOSIS — R0600 Dyspnea, unspecified: Secondary | ICD-10-CM | POA: Diagnosis not present

## 2016-03-21 DIAGNOSIS — R0602 Shortness of breath: Secondary | ICD-10-CM | POA: Diagnosis not present

## 2016-03-21 DIAGNOSIS — J90 Pleural effusion, not elsewhere classified: Secondary | ICD-10-CM | POA: Diagnosis not present

## 2016-03-21 DIAGNOSIS — J449 Chronic obstructive pulmonary disease, unspecified: Secondary | ICD-10-CM | POA: Diagnosis not present

## 2016-03-21 NOTE — Telephone Encounter (Signed)
lmtcb x1 for [name]. Order for oxygen has already been placed with [facility].

## 2016-03-21 NOTE — Telephone Encounter (Signed)
Noted  

## 2016-03-21 NOTE — Telephone Encounter (Signed)
[name] called back. Ria Comment was on the other line.  I advised [name] of Lindsay's msg that oxygen order has already been placed with [facility].  [name] states she will call [facility] tomorrow.  She will call back only if there are any problems.

## 2016-03-27 ENCOUNTER — Telehealth: Payer: Self-pay | Admitting: Acute Care

## 2016-03-27 NOTE — Telephone Encounter (Signed)
Attempted to contact Olin Hauser. No answer. Voicemail was not set up. Will try back.

## 2016-03-27 NOTE — Telephone Encounter (Signed)
Return call from [place]

## 2016-03-27 NOTE — Telephone Encounter (Signed)
Attempted to contact Olin Hauser. No answer. Voicemail is not set up. Will try back.

## 2016-03-28 ENCOUNTER — Encounter: Payer: Self-pay | Admitting: Pulmonary Disease

## 2016-03-28 MED ORDER — UMECLIDINIUM-VILANTEROL 62.5-25 MCG/INH IN AEPB: 1.0000 | INHALATION_SPRAY | Freq: Every day | RESPIRATORY_TRACT | Status: DC

## 2016-03-28 MED ORDER — IPRATROPIUM-ALBUTEROL 20-100 MCG/ACT IN AERS: 1.0000 | INHALATION_SPRAY | Freq: Four times a day (QID) | RESPIRATORY_TRACT | Status: DC | PRN

## 2016-03-28 NOTE — Telephone Encounter (Signed)
ATC, NA and no VM  I have went ahead and refilled the meds she requested, since this was not done at her last ov despite AVS instructions stating it would be done

## 2016-04-20 DIAGNOSIS — R0600 Dyspnea, unspecified: Secondary | ICD-10-CM | POA: Diagnosis not present

## 2016-04-20 DIAGNOSIS — J90 Pleural effusion, not elsewhere classified: Secondary | ICD-10-CM | POA: Diagnosis not present

## 2016-04-20 DIAGNOSIS — R0602 Shortness of breath: Secondary | ICD-10-CM | POA: Diagnosis not present

## 2016-04-20 DIAGNOSIS — J449 Chronic obstructive pulmonary disease, unspecified: Secondary | ICD-10-CM | POA: Diagnosis not present

## 2016-05-21 DIAGNOSIS — R0600 Dyspnea, unspecified: Secondary | ICD-10-CM | POA: Diagnosis not present

## 2016-05-21 DIAGNOSIS — R0602 Shortness of breath: Secondary | ICD-10-CM | POA: Diagnosis not present

## 2016-05-21 DIAGNOSIS — J90 Pleural effusion, not elsewhere classified: Secondary | ICD-10-CM | POA: Diagnosis not present

## 2016-05-21 DIAGNOSIS — J449 Chronic obstructive pulmonary disease, unspecified: Secondary | ICD-10-CM | POA: Diagnosis not present

## 2016-06-17 DIAGNOSIS — J449 Chronic obstructive pulmonary disease, unspecified: Secondary | ICD-10-CM | POA: Diagnosis not present

## 2016-06-17 DIAGNOSIS — I10 Essential (primary) hypertension: Secondary | ICD-10-CM | POA: Diagnosis not present

## 2016-06-17 DIAGNOSIS — Z0000 Encounter for general adult medical examination without abnormal findings: Secondary | ICD-10-CM | POA: Diagnosis not present

## 2016-06-17 DIAGNOSIS — E782 Mixed hyperlipidemia: Secondary | ICD-10-CM | POA: Diagnosis not present

## 2016-06-21 DIAGNOSIS — J90 Pleural effusion, not elsewhere classified: Secondary | ICD-10-CM | POA: Diagnosis not present

## 2016-06-21 DIAGNOSIS — R0602 Shortness of breath: Secondary | ICD-10-CM | POA: Diagnosis not present

## 2016-06-21 DIAGNOSIS — R0600 Dyspnea, unspecified: Secondary | ICD-10-CM | POA: Diagnosis not present

## 2016-06-21 DIAGNOSIS — J449 Chronic obstructive pulmonary disease, unspecified: Secondary | ICD-10-CM | POA: Diagnosis not present

## 2016-07-16 ENCOUNTER — Encounter: Payer: Self-pay | Admitting: Internal Medicine

## 2016-07-17 ENCOUNTER — Other Ambulatory Visit: Payer: Self-pay | Admitting: Internal Medicine

## 2016-07-17 ENCOUNTER — Telehealth: Payer: Self-pay | Admitting: Pulmonary Disease

## 2016-07-17 ENCOUNTER — Encounter: Payer: Self-pay | Admitting: Internal Medicine

## 2016-07-17 ENCOUNTER — Ambulatory Visit (INDEPENDENT_AMBULATORY_CARE_PROVIDER_SITE_OTHER)
Admission: RE | Admit: 2016-07-17 | Discharge: 2016-07-17 | Disposition: A | Discharge status: Home / Self Care | Payer: Medicare HMO | Source: Ambulatory Visit | Attending: Pulmonary Disease | Admitting: Pulmonary Disease

## 2016-07-17 ENCOUNTER — Ambulatory Visit (INDEPENDENT_AMBULATORY_CARE_PROVIDER_SITE_OTHER): Payer: Medicare HMO | Admitting: Internal Medicine

## 2016-07-17 ENCOUNTER — Encounter: Payer: Self-pay | Admitting: Pulmonary Disease

## 2016-07-17 ENCOUNTER — Ambulatory Visit (INDEPENDENT_AMBULATORY_CARE_PROVIDER_SITE_OTHER): Payer: Medicare HMO | Admitting: Pulmonary Disease

## 2016-07-17 VITALS — BP 124/62 | HR 65 | Ht 64.0 in | Wt 180.0 lb

## 2016-07-17 DIAGNOSIS — E1151 Type 2 diabetes mellitus with diabetic peripheral angiopathy without gangrene: Secondary | ICD-10-CM | POA: Diagnosis not present

## 2016-07-17 DIAGNOSIS — J90 Pleural effusion, not elsewhere classified: Secondary | ICD-10-CM | POA: Diagnosis not present

## 2016-07-17 DIAGNOSIS — J9612 Chronic respiratory failure with hypercapnia: Secondary | ICD-10-CM | POA: Diagnosis not present

## 2016-07-17 DIAGNOSIS — J449 Chronic obstructive pulmonary disease, unspecified: Secondary | ICD-10-CM

## 2016-07-17 DIAGNOSIS — R2681 Unsteadiness on feet: Secondary | ICD-10-CM

## 2016-07-17 DIAGNOSIS — Z23 Encounter for immunization: Secondary | ICD-10-CM

## 2016-07-17 NOTE — Assessment & Plan Note (Signed)
Last HgA1c 6.6 and needs monitoring about every 6 months or sooner if she is doing prednisone courses. She has not had any and fasting sugars are at goal at home. Complicated by PVD and she is taking ASA daily.

## 2016-07-17 NOTE — Progress Notes (Signed)
   Subjective:    Patient ID: Emily Owen, female    DOB: May 12, 1930, 82 y.o.   MRN: [id number]  HPI The patient is an 82 YO female coming in for follow up of her sugars. She is doing well with these overall although her diet is mediocre. She has not needed any prednisone tapers since the last visit. The prednisone is what caused her sugars to elevate in the past. She denies numbness in her feet or pains. She does have vascular disease which is associated (diabetes since the 90s). Not taking any meds right now for the sugars and with normal renal function.   Review of Systems  Constitutional: Positive for activity change and fatigue. Negative for appetite change, chills, fever and unexpected weight change.  HENT: Negative.   Respiratory: Positive for cough and shortness of breath. Negative for chest tightness.   Cardiovascular: Negative for chest pain, palpitations and leg swelling.  Gastrointestinal: Negative for abdominal distention, abdominal pain, constipation, diarrhea and nausea.  Musculoskeletal: Positive for arthralgias and gait problem. Negative for back pain and myalgias.      Objective:   Physical Exam  Constitutional: She is oriented to person, place, and time. She appears well-developed and well-nourished.  Overweight  HENT:  Head: Normocephalic and atraumatic.  Eyes: EOM are normal.  Neck: Normal range of motion.  Cardiovascular: Normal rate and regular rhythm.   Pulmonary/Chest: Effort normal. No respiratory distress. She has no wheezes.  No change to lung exam from prior.   Abdominal: Soft. Bowel sounds are normal. She exhibits no distension. There is no tenderness. There is no rebound.  Musculoskeletal: She exhibits no edema.  Neurological: She is alert and oriented to person, place, and time.  Skin: Skin is warm and dry.  Psychiatric: She has a normal mood and affect.   Vitals:   07/17/16 1054  BP: 100/70  Resp: 14  Temp: 98.3 F (36.8 C)  TempSrc: Oral   SpO2: 94%  Weight: 181 lb (82.1 kg)  Height: 5\' 4"  (1.626 m)      Assessment & Plan:  High dose flu given

## 2016-07-17 NOTE — Telephone Encounter (Signed)
Patient's daughter states that her mom will need qualifying walk/ 6 min walk for O2 to switch to [facility] [place]. Patient uses 3L continuous all day.    Dr. Lake Bells, please advise what patient needs to do to qualify to switch her oxygen to [place].

## 2016-07-17 NOTE — Assessment & Plan Note (Signed)
Continue 4 L of oxygen continuously 

## 2016-07-17 NOTE — Patient Instructions (Signed)
I will talk to Dr. Sharlet Salina about having you referred to vestibular rehabilitation Keep taking your Anoro as you're doing We will change her prescription of oxygen to [facility] We will see you back in 4 months or sooner if needed

## 2016-07-17 NOTE — Progress Notes (Signed)
Pre visit review using our clinic review tool, if applicable. No additional management support is needed unless otherwise documented below in the visit note. 

## 2016-07-17 NOTE — Assessment & Plan Note (Signed)
Today on exam I can hear a significant effusion but the last chest x-ray in December showed that it was a little bigger. We sampled this several times and never found evidence of malignancy and the workup in 2016 suggested that it was related to her fall.  Plan: Repeat chest x-ray today

## 2016-07-17 NOTE — Assessment & Plan Note (Signed)
With walking O2 down to 74% which elevated back to 94% with sitting for 2 minutes.

## 2016-07-17 NOTE — Patient Instructions (Signed)
We are giving you the flu shot today and do not need any blood work.

## 2016-07-17 NOTE — Assessment & Plan Note (Signed)
She has severe COPD based on her lung function testing. This is complicated by profound deconditioning. She was unable to afford pulmonary rehabilitation and more concerning leg with all of her balance issues I'm afraid that she would have a fall participating in the exercises they prescribed.  I explained to her today that there is little I can do to make her lungs better at this point, but the more she exercises the more would help her.  Her immunizations are up-to-date.  Plan: Continue Anoro I will talk to her primary care physician about balance focused physical therapy Ideally if she could participate in something like that she may be able to go to pulmonary rehabilitation later in the year Flu shot up-to-date Follow-up 4 months

## 2016-07-17 NOTE — Progress Notes (Signed)
Subjective:    Patient ID: Emily Owen, female    DOB: [birth year], 82 y.o.   MRN: [id number]  Synopsis: Referred in 2016 for severe COPD and evaluation of a pleural effusion after an episode of pneumonia. June 2016 pulmonary function testing ratio 49%, FEV1 0.85 L (44% predicted, 9% change postbronchodilator), total lung capacity 5.33 L (103% predicted), DLCO 12.21 (48% predicted)  HPI Chief Complaint  Patient presents with  . Follow-up    pt doing well, no breathing complaints today.    [name] says she hsa been OK.  She feels "old". She says that her breathing is problematic at times. She says that she sometimes can't remember to breathe through her nose. She has some trouble walking around outside.  She says if she breathing through her nose her breathing is better. She coughs some, not severely so.  She wonders if she needs chest congestion. She has a lot of sinus contestion Some mucus produciton when she coughs. No leg swelling.  Some foot swelling if she keeps them down. She is limited by her back pain and balance for breathing.    Past Medical History:  Diagnosis Date  . Cancer ([place])    colon  . Cancer of cervix ([place])    cervix  . COPD (chronic obstructive pulmonary disease) ([place])   . Gallstones   . Gout   . Hard of hearing   . Osteoporosis   . Pleural effusion       Review of Systems  Constitutional: Negative for chills, fatigue and fever.  HENT: Negative for postnasal drip, rhinorrhea and sinus pressure.   Respiratory: Positive for shortness of breath. Negative for cough and wheezing.   Cardiovascular: Negative for chest pain, palpitations and leg swelling.       Objective:   Physical Exam Vitals:   07/17/16 1226  BP: 124/62  Pulse: 65  SpO2: 94%  Weight: 180 lb (81.6 kg)  Height: 5\' 4"  (1.626 m)    4 L O2  Gen: chronically ill appearing HENT: OP clear, NCAT PULM: CTA B, poor air movement, normal effort CV: RRR, no mgr, trace  edema GI: BS+, soft, nontender Derm: no cyanosis or rash Psyche: normal mood and affect  December 2016 chest x-ray showed right-sided pleural effusion, no other changes     Assessment & Plan:  COPD, severe ([place]) She has severe COPD based on her lung function testing. This is complicated by profound deconditioning. She was unable to afford pulmonary rehabilitation and more concerning leg with all of her balance issues I'm afraid that she would have a fall participating in the exercises they prescribed.  I explained to her today that there is little I can do to make her lungs better at this point, but the more she exercises the more would help her.  Her immunizations are up-to-date.  Plan: Continue Anoro I will talk to her primary care physician about balance focused physical therapy Ideally if she could participate in something like that she may be able to go to pulmonary rehabilitation later in the year Flu shot up-to-date Follow-up 4 months  Chronic respiratory failure with hypercapnia (HCC) Continue 4 L of oxygen continuously.  Pleural effusion Today on exam I can hear a significant effusion but the last chest x-ray in December showed that it was a little bigger. We sampled this several times and never found evidence of malignancy and the workup in 2016 suggested that it was related to her fall.  Plan: Repeat chest  x-ray today    Current Outpatient Prescriptions:  .  amLODipine (NORVASC) 5 MG tablet, Take 1 tablet (5 mg total) by mouth daily., Disp: 90 tablet, Rfl: 3 .  aspirin EC 81 MG tablet, Take 81 mg by mouth daily., Disp: , Rfl:  .  Cholecalciferol (VITAMIN D) 2000 UNITS CAPS, Take 2,000 Units by mouth daily., Disp: , Rfl:  .  colchicine 0.6 MG tablet, Take 1 tablet (0.6 mg total) by mouth daily., Disp: 30 tablet, Rfl: 1 .  fish oil-omega-3 fatty acids 1000 MG capsule, Take 2 g by mouth 2 (two) times daily., Disp: , Rfl:  .  glucose blood (BAYER CONTOUR NEXT TEST) test  strip, Use as instructed, Disp: 100 each, Rfl: 12 .  guaiFENesin (MUCINEX) 600 MG 12 hr tablet, Take 2 tablets (1,200 mg total) by mouth 2 (two) times daily. (Patient taking differently: Take 1,200 mg by mouth 2 (two) times daily as needed. ), Disp: 10 tablet, Rfl: 0 .  Ipratropium-Albuterol (COMBIVENT RESPIMAT) 20-100 MCG/ACT AERS respimat, Inhale 1 puff into the lungs every 6 (six) hours as needed for wheezing., Disp: 1 Inhaler, Rfl: 5 .  Multiple Vitamins-Minerals (PRESERVISION AREDS PO), Take 1 capsule by mouth 2 (two) times daily., Disp: , Rfl:  .  rosuvastatin (CRESTOR) 20 MG tablet, Take 1 tablet (20 mg total) by mouth at bedtime., Disp: 90 tablet, Rfl: 3 .  umeclidinium-vilanterol (ANORO ELLIPTA) 62.5-25 MCG/INH AEPB, Inhale 1 puff into the lungs daily., Disp: 60 each, Rfl: 5

## 2016-07-18 NOTE — Telephone Encounter (Signed)
Order entered for 6 min walk to be performed at [facility].  Message sent to daughter via mychart.  Nothing further needed.

## 2016-07-18 NOTE — Telephone Encounter (Signed)
Schedule 6MW, try [place] [facility]

## 2016-07-19 NOTE — Telephone Encounter (Signed)
Call was regarding cxr results- results have been given to pt.  Nothing further needed.

## 2016-07-19 NOTE — Telephone Encounter (Signed)
Patient called - states she is returning Ashley's call from yesterday - she can be reached at [phone]

## 2016-07-21 DIAGNOSIS — J449 Chronic obstructive pulmonary disease, unspecified: Secondary | ICD-10-CM | POA: Diagnosis not present

## 2016-07-21 DIAGNOSIS — R0602 Shortness of breath: Secondary | ICD-10-CM | POA: Diagnosis not present

## 2016-07-21 DIAGNOSIS — J90 Pleural effusion, not elsewhere classified: Secondary | ICD-10-CM | POA: Diagnosis not present

## 2016-07-21 DIAGNOSIS — R0600 Dyspnea, unspecified: Secondary | ICD-10-CM | POA: Diagnosis not present

## 2016-07-27 ENCOUNTER — Ambulatory Visit (HOSPITAL_COMMUNITY)
Admission: RE | Admit: 2016-07-27 | Discharge: 2016-07-27 | Disposition: A | Discharge status: Home / Self Care | Payer: Medicare HMO | Source: Ambulatory Visit | Attending: Pulmonary Disease | Admitting: Pulmonary Disease

## 2016-07-27 DIAGNOSIS — J449 Chronic obstructive pulmonary disease, unspecified: Secondary | ICD-10-CM

## 2016-07-27 DIAGNOSIS — Z029 Encounter for administrative examinations, unspecified: Secondary | ICD-10-CM | POA: Insufficient documentation

## 2016-07-31 ENCOUNTER — Encounter: Payer: Self-pay | Admitting: Pulmonary Disease

## 2016-07-31 NOTE — Telephone Encounter (Signed)
Please advise [name] on patient advise e-mail.  Pt wishes to switch DME for O2.  Also, pt had 6MW test done at [facility] and wanted to make you aware of this.  Please advise [name]. Thanks.   ----- Message -----    From: Romie Levee    Sent: 07/31/2016  7:14 AM EDT      To: Simonne Maffucci, MD Subject: Visit Follow-Up Question  Mom had the 6 minute oxygen test walk at [facility].  We would like to have a prescription for home oxygen and test results that CMS needs sent to [facility].  We want to move her home oxygen provider to [facility] and away from [place]. Please send this prescription order to [facility] this week and I will follow up with them to get her switched.  Floy Sabina

## 2016-08-01 NOTE — Telephone Encounter (Signed)
Per BQ: Message  That is fine by me to switch. However I thought the patient told me that she wanted to switch from [facility] to [facility]. I'm good either way. Please confirm.   Per e-mail from daughter states she would like pt's services switched TO [facility].  The order was already placed on 9.26.17 and the order was confirmed on 10.2.17.  Called [facility] and spoke with Estill Bamberg who reported this is still in process and they are waiting to receive all the information from [facility].  She will continue to work on this.  E-mail sent to pt's daughter.

## 2016-08-07 NOTE — Telephone Encounter (Signed)
Spoke with patient, states that she has not heard from DME .  Aware that we have been in communication with [facility] on getting her switched over. Per Emily Owen at [place] they are still waiting on [facility] to send over all information on the patient. Pt is aware that I will contact [facility] to see where we stand on this switch.   Spoke with Emily Owen at [facility], states that she is working on this today. Pt aware that she should be hearing from [facility] today. Nothing further needed.

## 2016-08-19 IMAGING — CR DG CHEST 2V
2 series · 2 of 2 positions shown · non-contrast
Comparison: March 23, 2015.

CLINICAL DATA: Pleural effusion.

EXAM:
CHEST  2 VIEW

[view not recorded (1 of 2)]
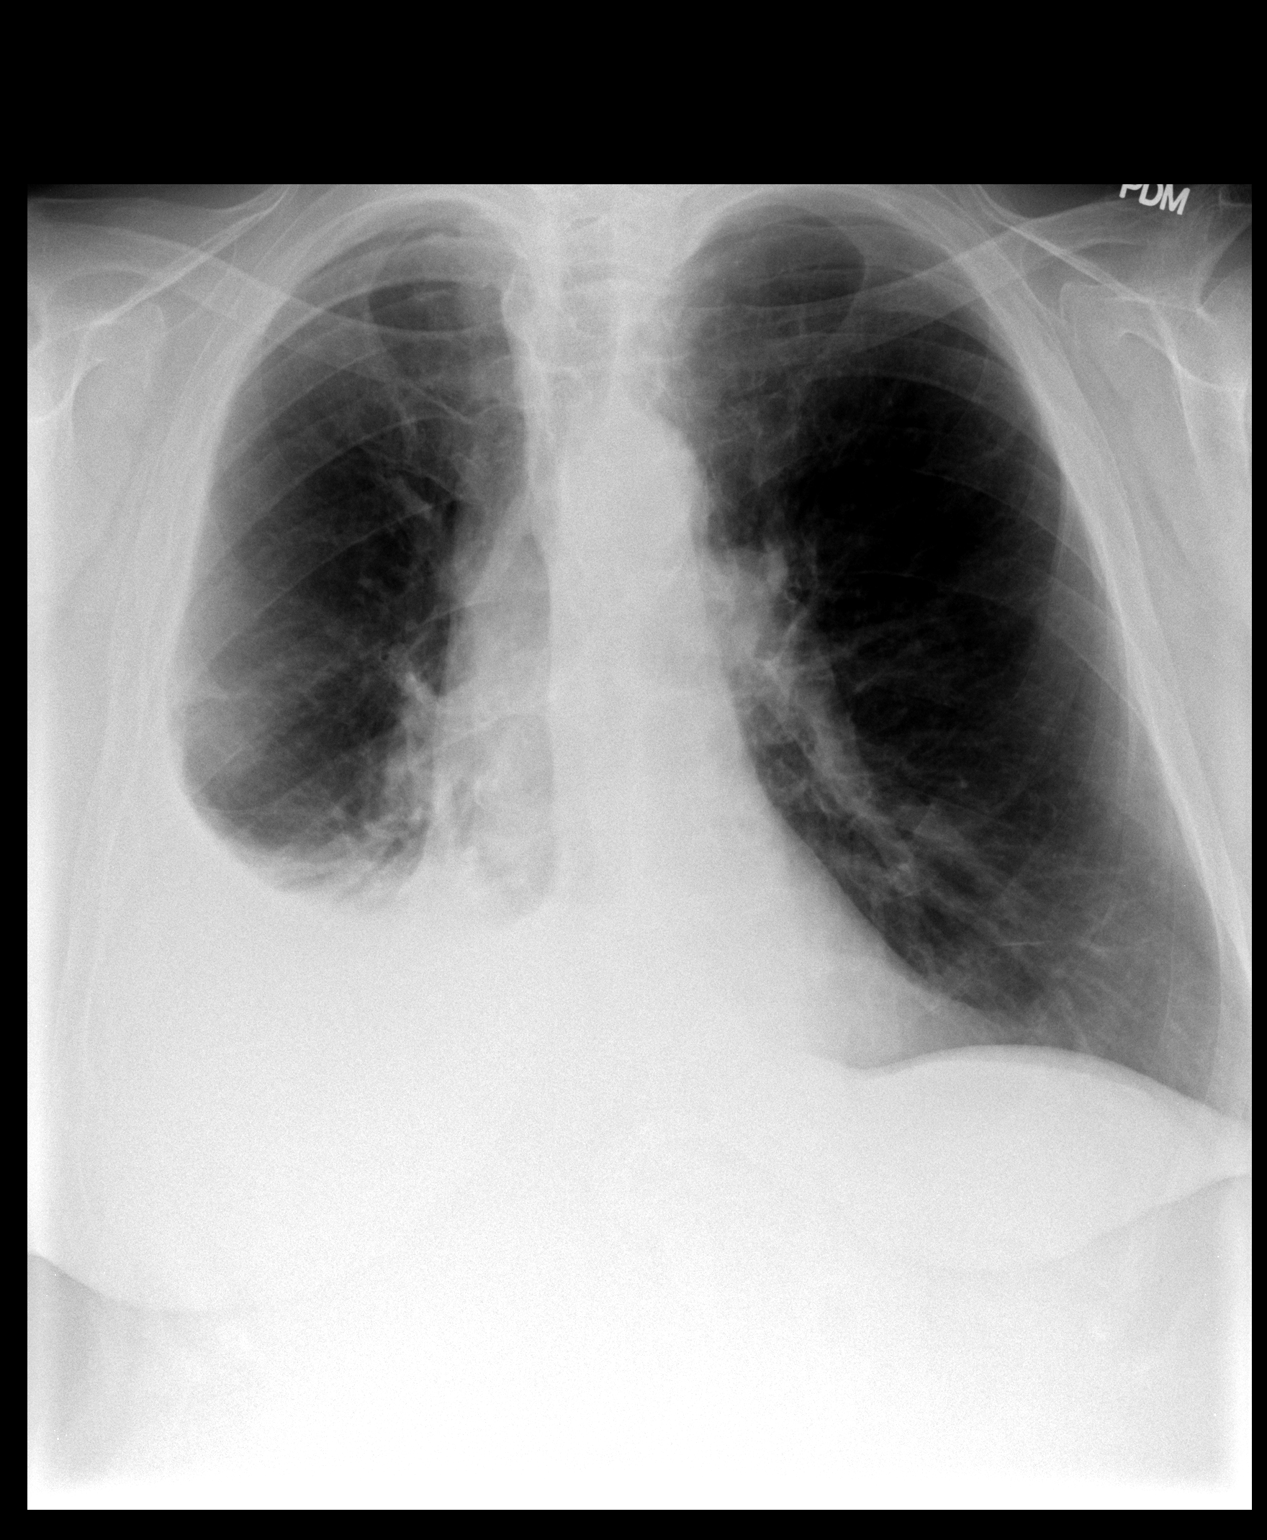

[view not recorded (2 of 2)]
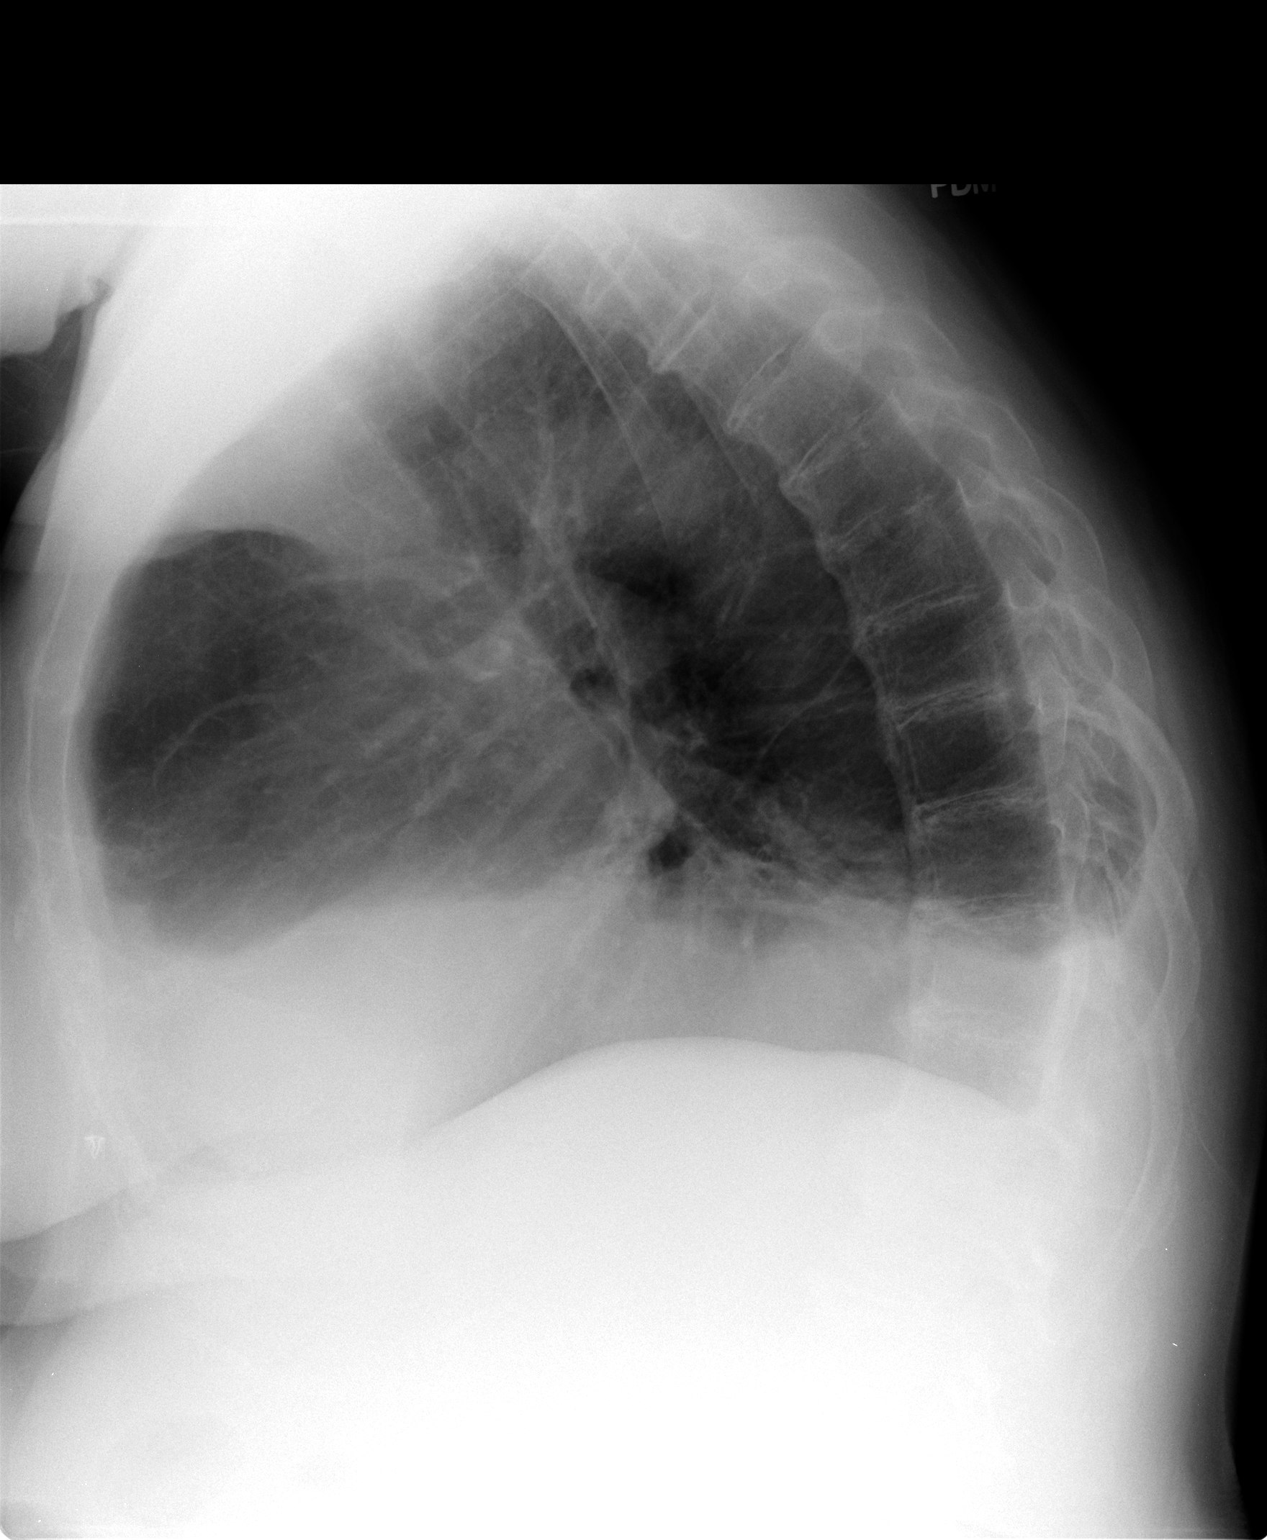

[2 of 2 positions shown; findings below may reference images not displayed]

FINDINGS: The heart size and mediastinal contours are within normal limits. No
pneumothorax is noted. Left lung is clear. Moderate right pleural
effusion is noted which is significantly increased compared to prior
exam. The visualized skeletal structures are unremarkable.
IMPRESSION: Moderate right pleural effusion which is significantly increased
compared to prior exam.

## 2016-08-21 DIAGNOSIS — J449 Chronic obstructive pulmonary disease, unspecified: Secondary | ICD-10-CM | POA: Diagnosis not present

## 2016-08-21 DIAGNOSIS — R0600 Dyspnea, unspecified: Secondary | ICD-10-CM | POA: Diagnosis not present

## 2016-08-21 DIAGNOSIS — J90 Pleural effusion, not elsewhere classified: Secondary | ICD-10-CM | POA: Diagnosis not present

## 2016-08-21 DIAGNOSIS — R0602 Shortness of breath: Secondary | ICD-10-CM | POA: Diagnosis not present

## 2016-08-28 IMAGING — US US THORACENTESIS ASP PLEURAL SPACE W/IMG GUIDE
1 series · 7 of 7 positions shown · non-contrast
Comparison: Previous [facility]

MEDICATIONS:
10 cc 1% lidocaine

COMPLICATIONS:
None immediate

INDICATION: Symptomatic R sided pleural effusion

EXAM:
US THORACENTESIS ASP PLEURAL SPACE W/IMG GUIDE
TECHNIQUE: Informed written consent was obtained from the patient after a
discussion of the risks, benefits and alternatives to treatment. A
timeout was performed prior to the initiation of the procedure.

[Series 1: us thoracentesis asp pleural space w/img guide · 0.27mm/px · 7 of 7 slices shown]
[im 1/7]
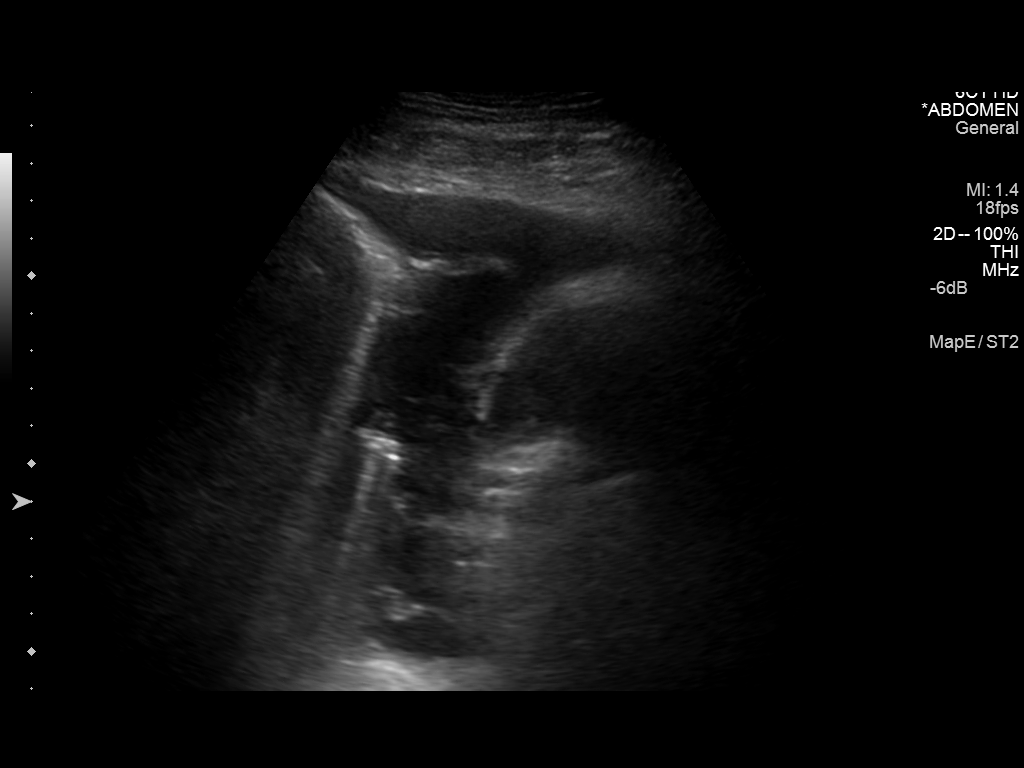
[im 2/7]
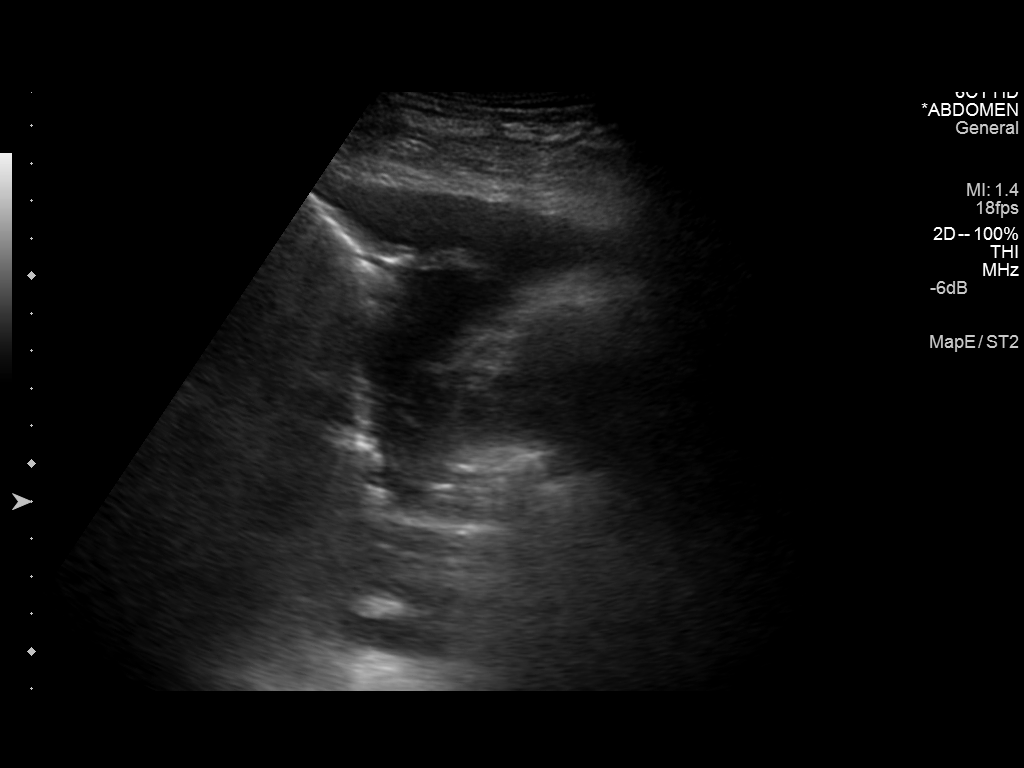
[im 3/7]
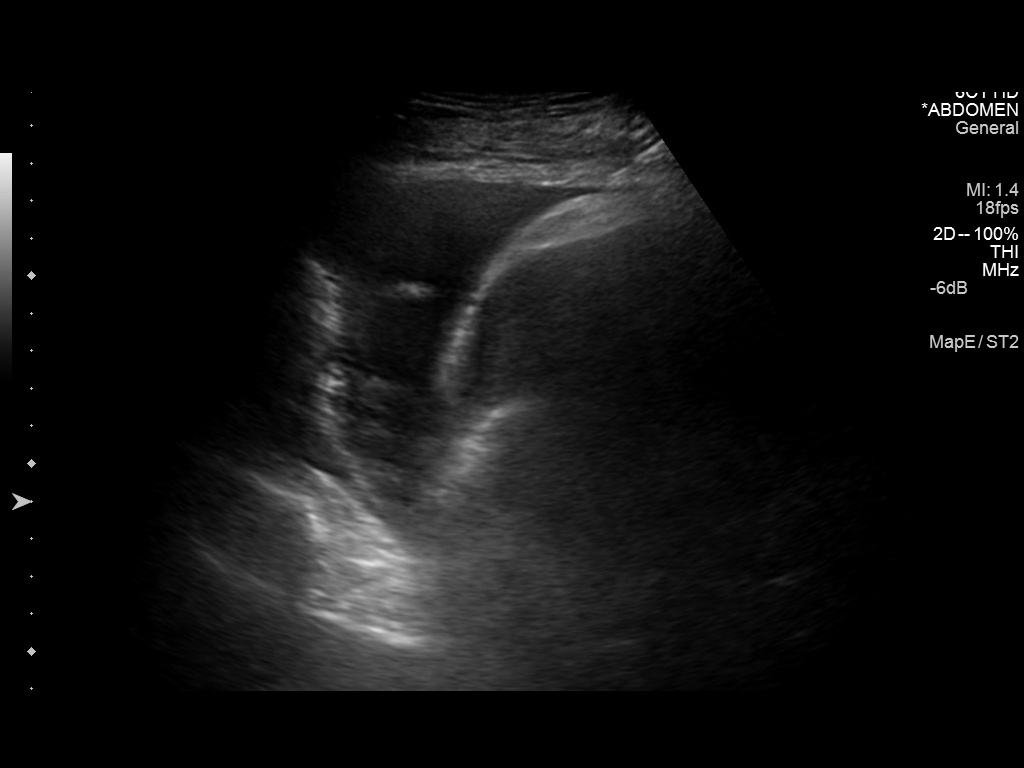
[im 4/7]
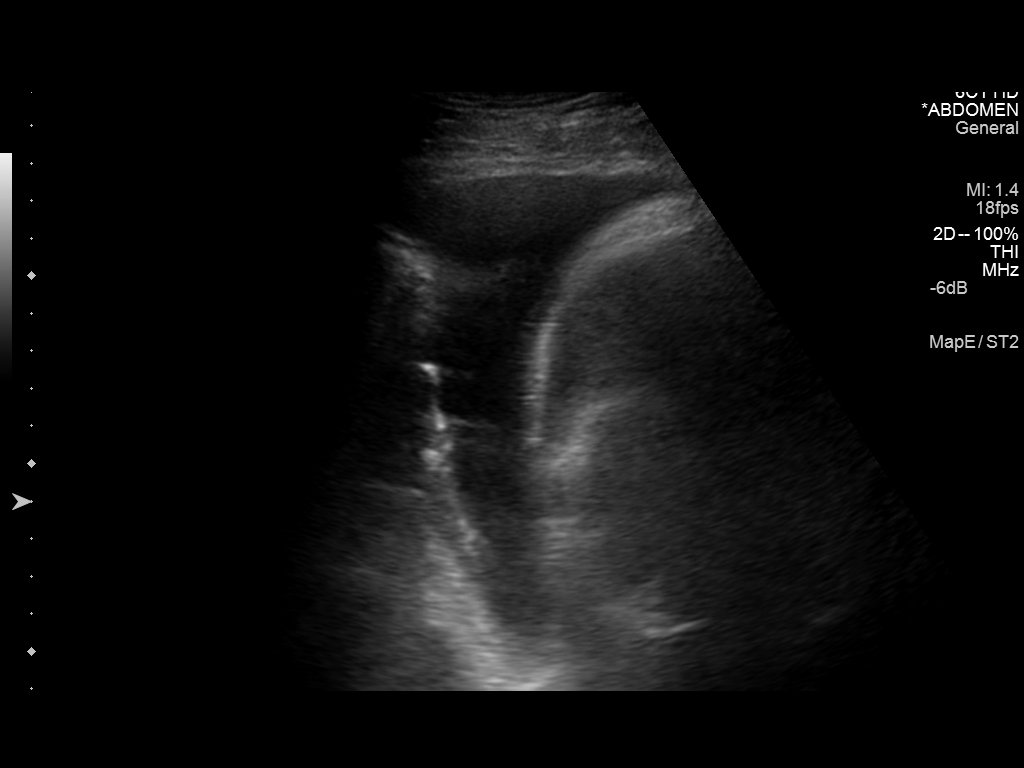
[im 5/7]
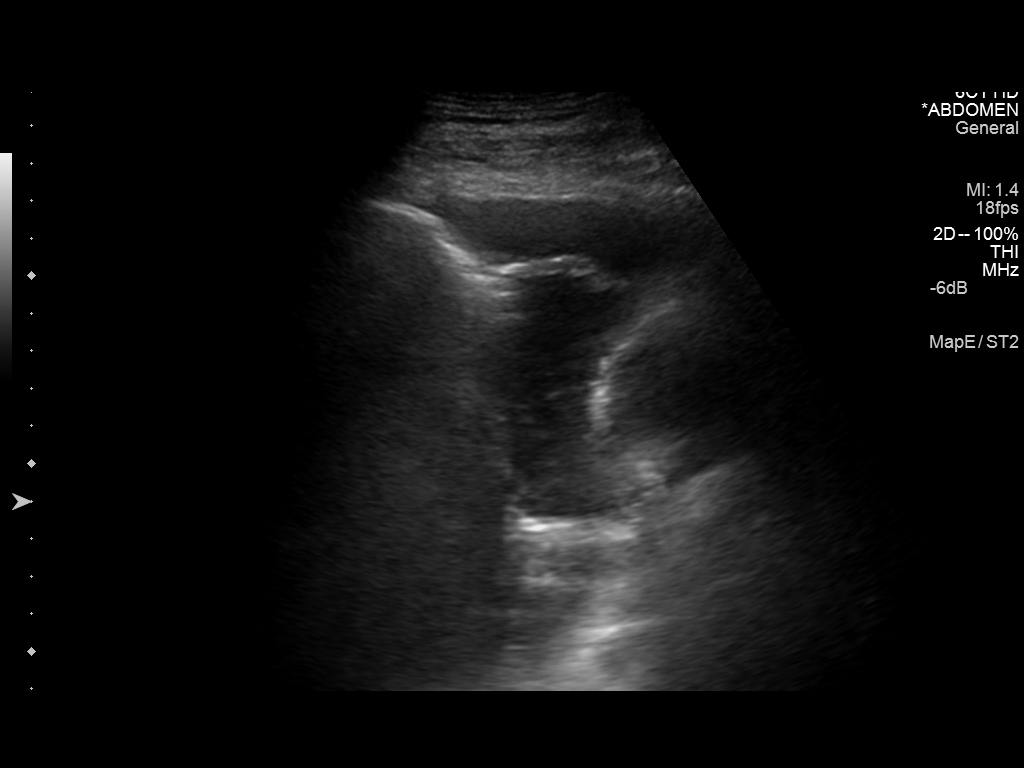
[im 6/7]
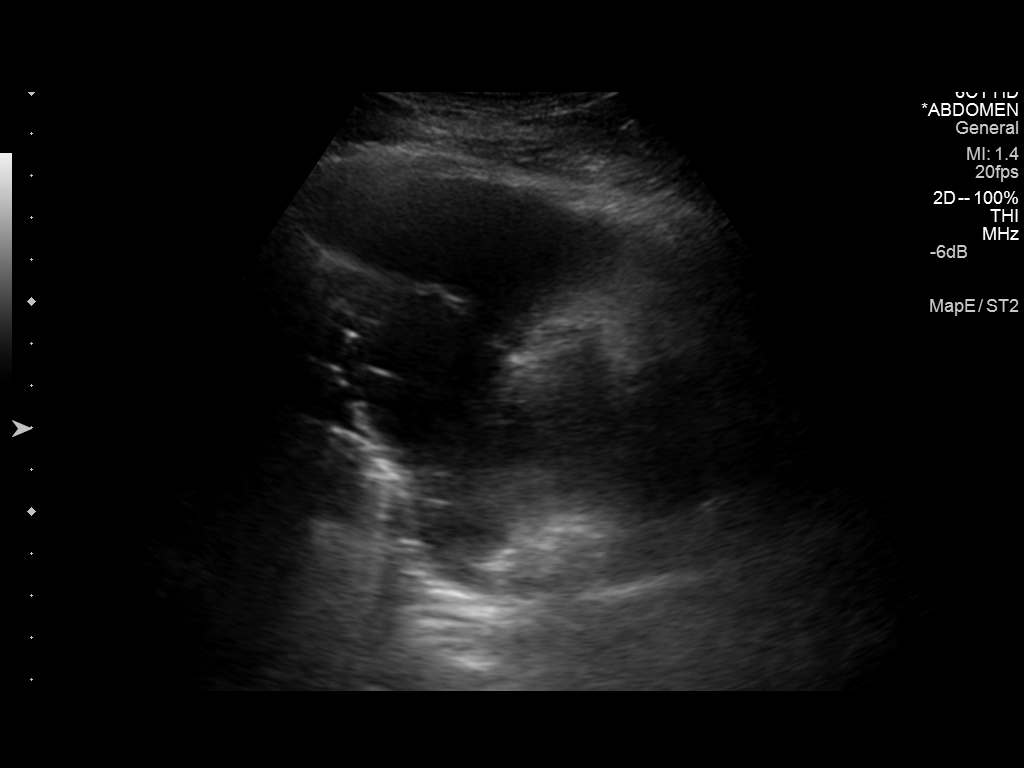
[im 7/7]
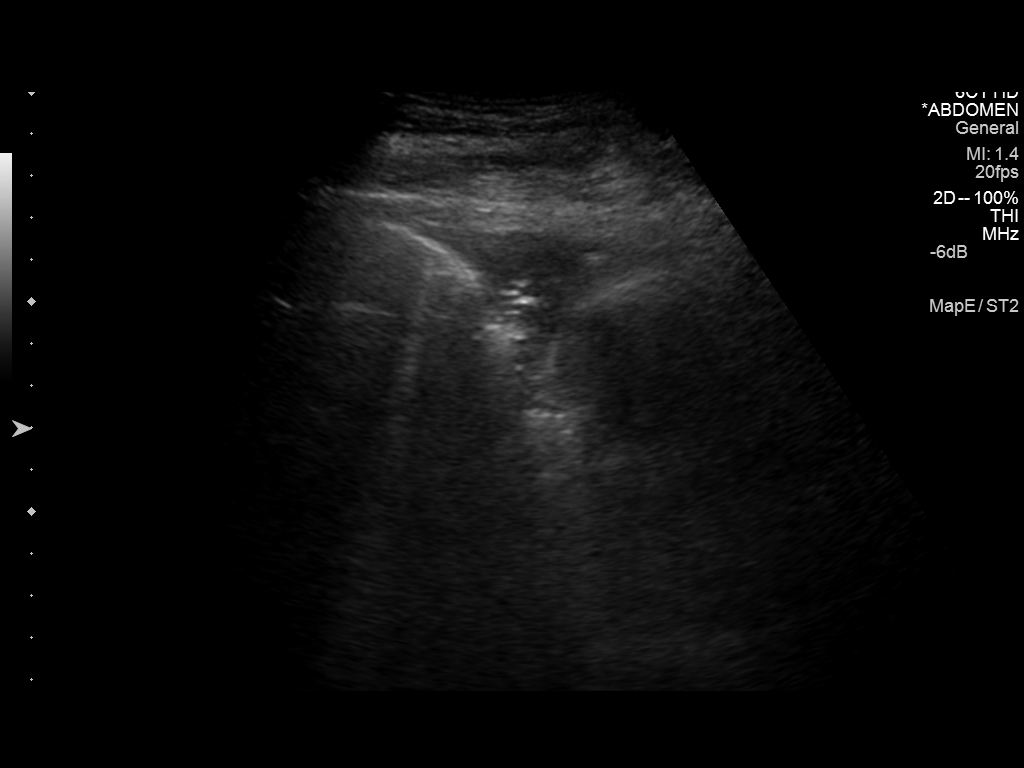

[7 of 7 positions shown; findings below may reference images not displayed]

Initial ultrasound scanning demonstrates a right pleural effusion.
The lower chest was prepped and draped in the usual sterile fashion.
1% lidocaine was used for local anesthesia.

Under direct ultrasound guidance, a 19 gauge, 7-cm, Yueh catheter
was introduced. An ultrasound image was saved for documentation
purposes. The thoracentesis was performed. The catheter was removed
and a dressing was applied. The patient tolerated the procedure well
without immediate post procedural complication. The patient was
escorted to have an upright chest radiograph.
FINDINGS: A total of approximately 500 cc of yellow fluid was removed.
Requested samples were sent to the laboratory.
IMPRESSION: Successful ultrasound-guided R sided thoracentesis yielding 500 cc
of pleural fluid.

Read by:  Guohao Verduyckt

## 2016-08-28 IMAGING — CR DG CHEST 1V
1 series · 1 of 1 positions shown · non-contrast
Comparison: 06/01/2015

CLINICAL DATA: Status post right thoracentesis.

EXAM:
CHEST  1 VIEW

[w chest pa]
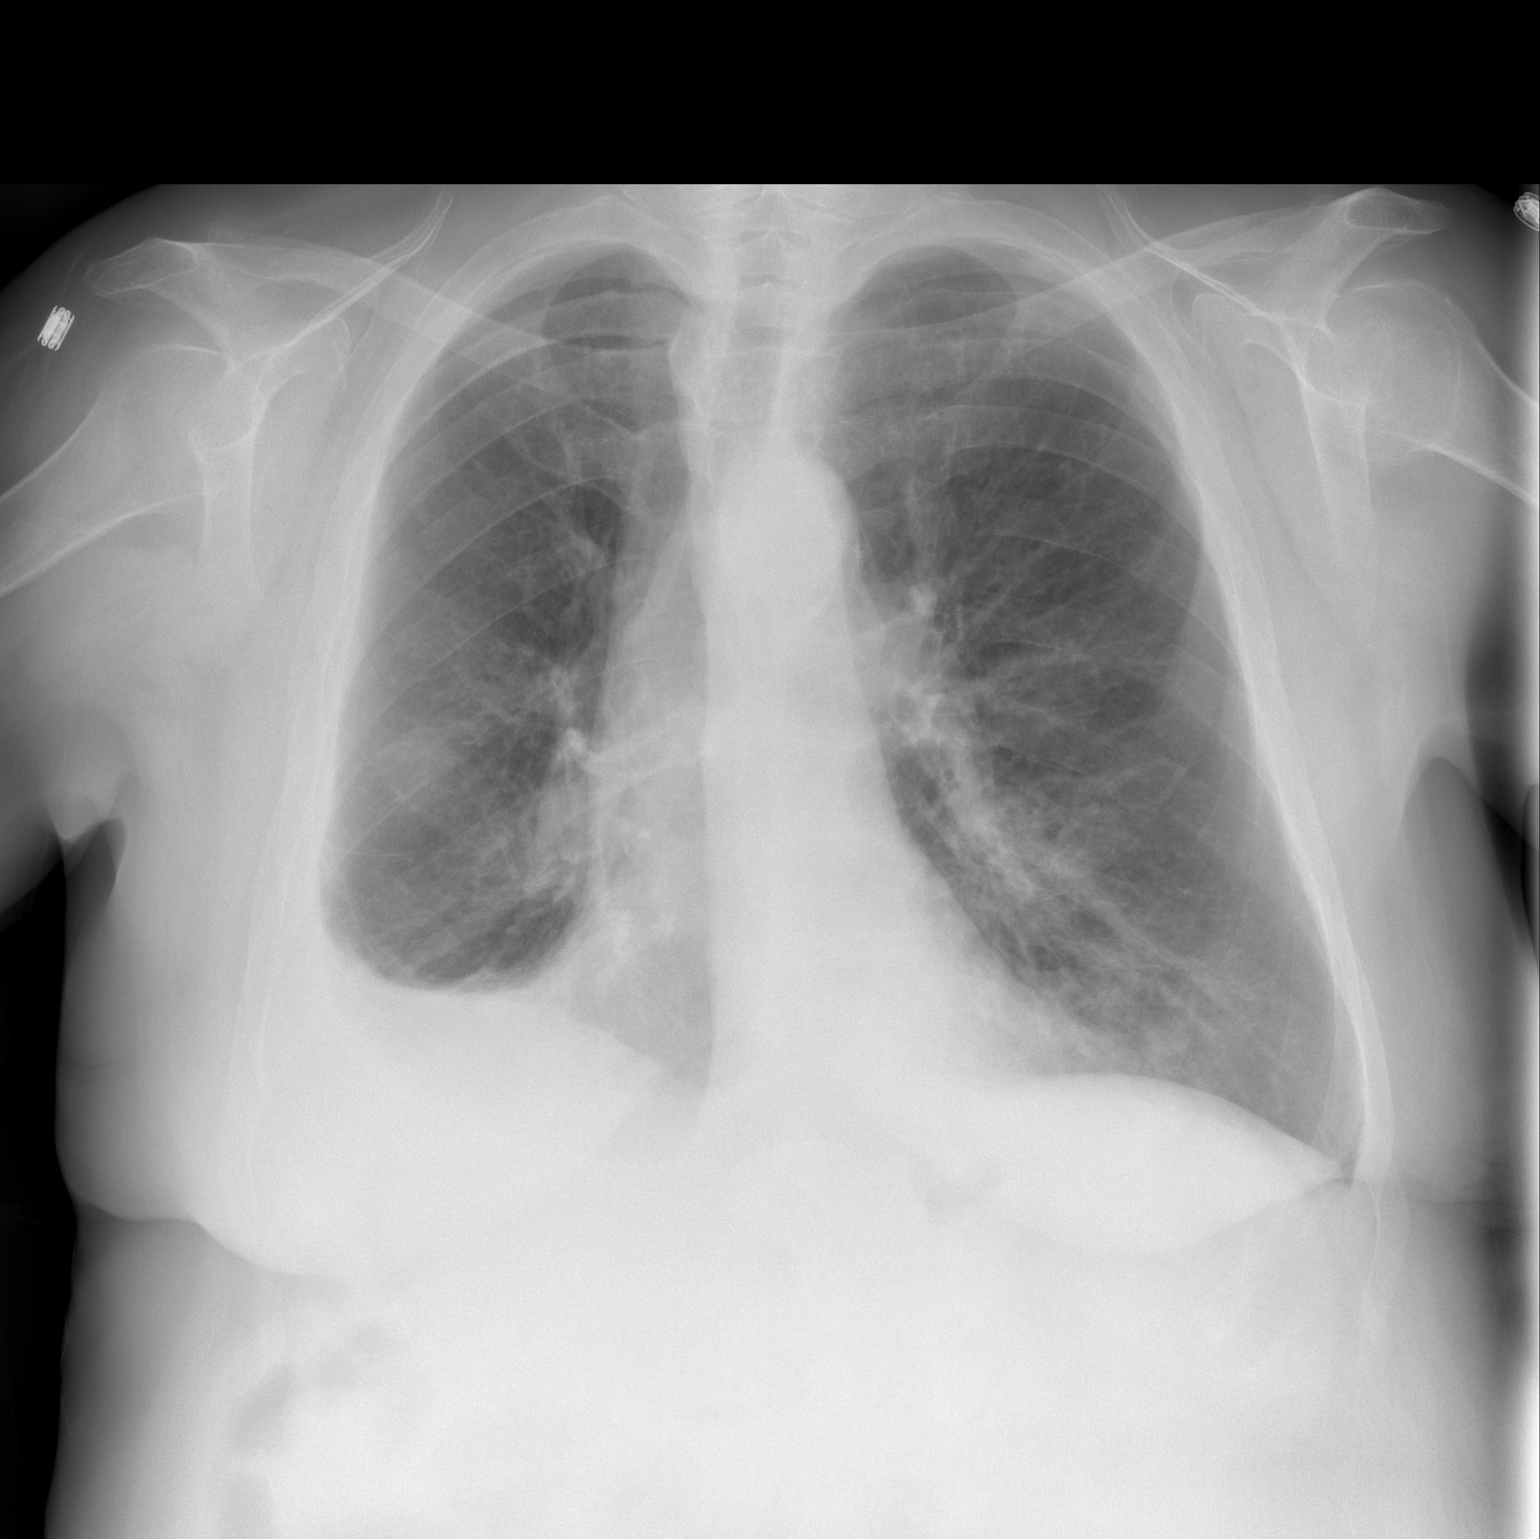

[1 of 1 positions shown; findings below may reference images not displayed]

FINDINGS: The E cardiac silhouette, mediastinal and hilar contours are within
normal limits and stable. There is tortuosity and calcification of
the thoracic aorta. The right-sided pleural effusion is smaller. No
postprocedural pneumothorax identified. The left lung is clear.
Rounded nodular density in the left lower thorax is likely a nipple
shadow.
IMPRESSION: Interval decrease in size of the right pleural effusion. No
postprocedural pneumothorax.

## 2016-08-28 IMAGING — DX DG CHEST 1V
1 series · 1 of 1 positions shown · non-contrast
Comparison: Chest x-ray from earlier today

CLINICAL DATA: Fall with head injury.

EXAM:
CHEST  1 VIEW

[chest ap strecther]
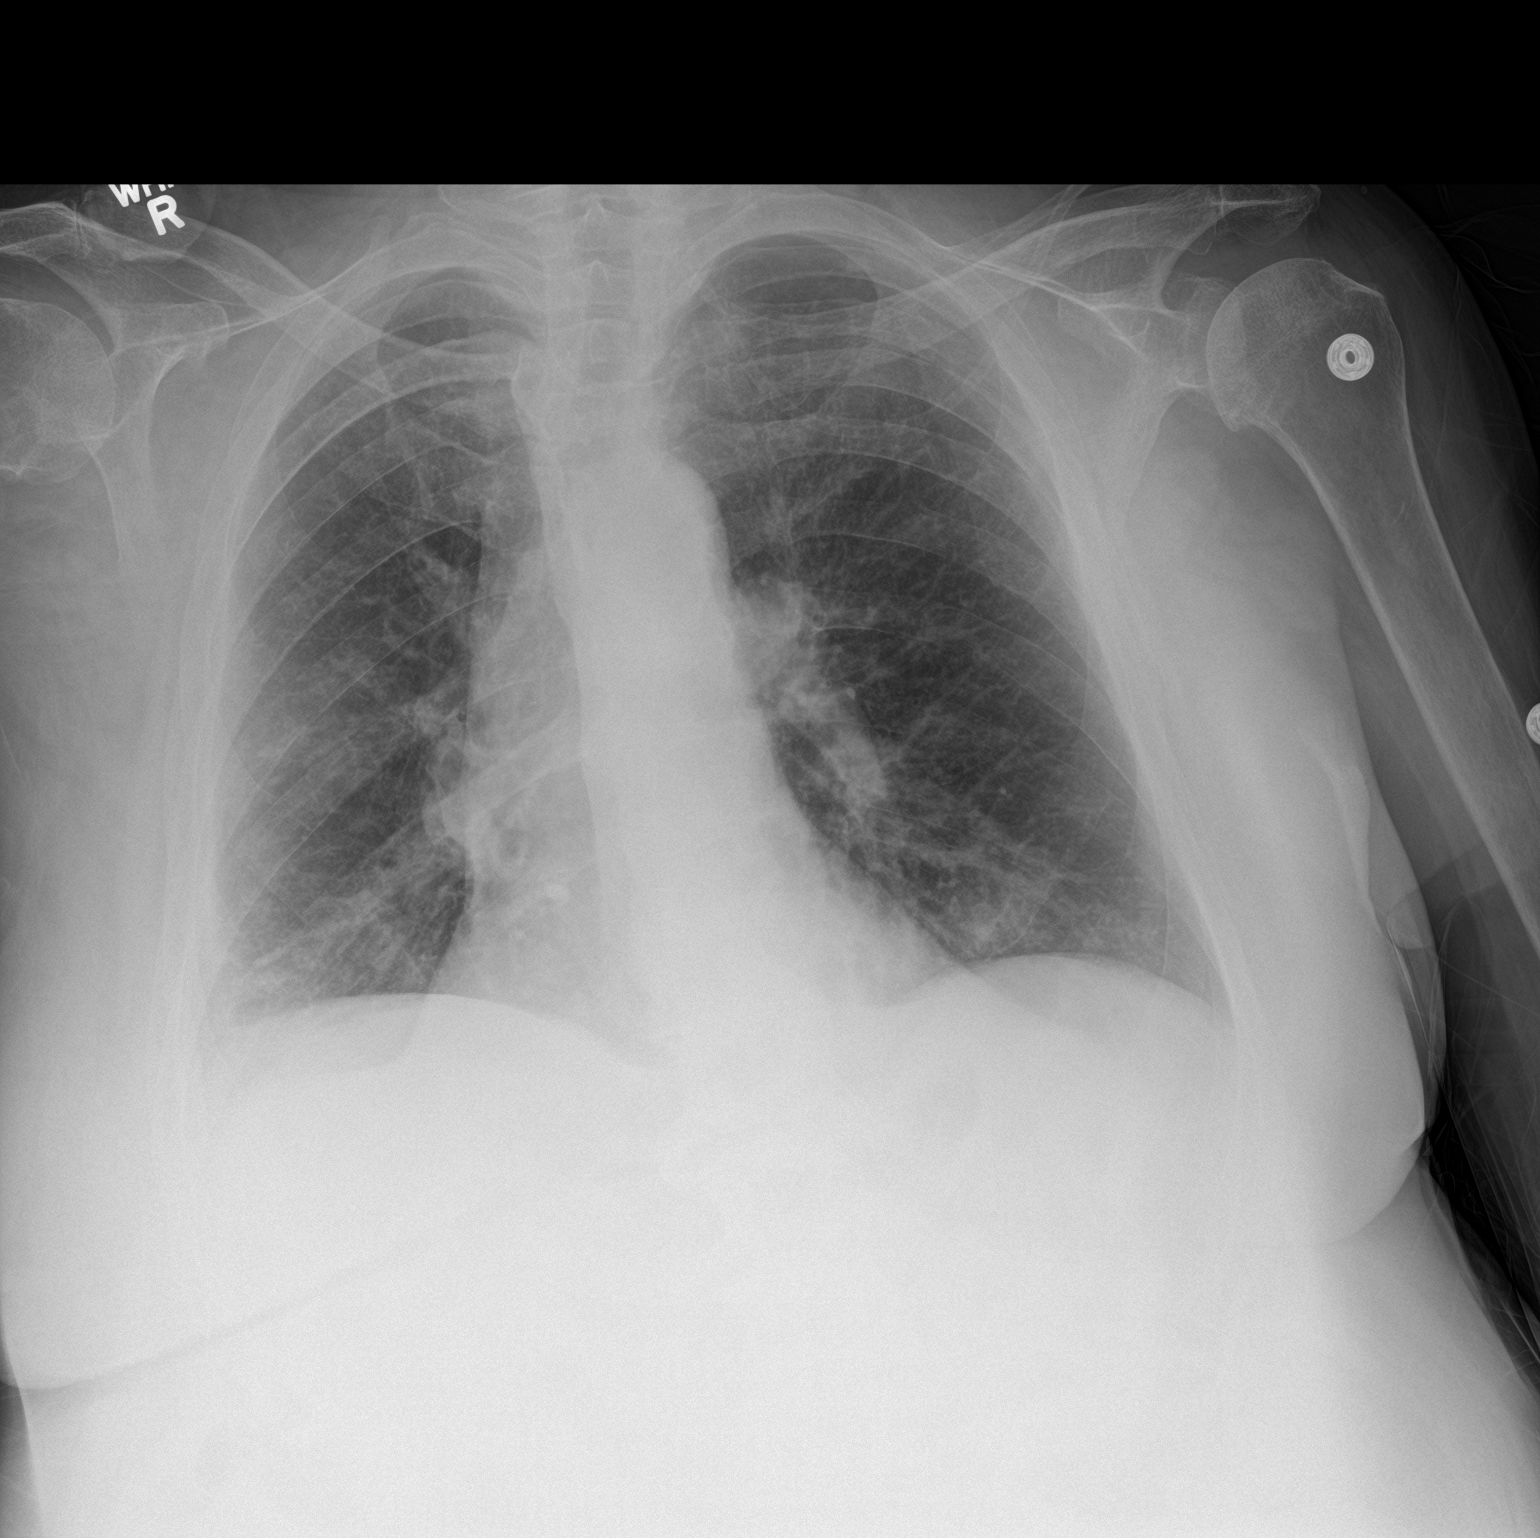

[1 of 1 positions shown; findings below may reference images not displayed]

FINDINGS: Coarsened lower lung markings and streaky basilar opacities as
previously seen. Patient has emphysema based on March 2015 chest CT.
Small right pleural effusion is less apparent than prior, likely
positional. No evidence of fracture or pneumothorax. Normal heart
size and mediastinal contours.
IMPRESSION: No change from earlier today to suggest acute injury.

## 2016-09-20 DIAGNOSIS — R0600 Dyspnea, unspecified: Secondary | ICD-10-CM | POA: Diagnosis not present

## 2016-09-20 DIAGNOSIS — R0602 Shortness of breath: Secondary | ICD-10-CM | POA: Diagnosis not present

## 2016-09-20 DIAGNOSIS — J449 Chronic obstructive pulmonary disease, unspecified: Secondary | ICD-10-CM | POA: Diagnosis not present

## 2016-09-20 DIAGNOSIS — J90 Pleural effusion, not elsewhere classified: Secondary | ICD-10-CM | POA: Diagnosis not present

## 2016-10-21 DIAGNOSIS — R0602 Shortness of breath: Secondary | ICD-10-CM | POA: Diagnosis not present

## 2016-10-21 DIAGNOSIS — J449 Chronic obstructive pulmonary disease, unspecified: Secondary | ICD-10-CM | POA: Diagnosis not present

## 2016-10-21 DIAGNOSIS — J90 Pleural effusion, not elsewhere classified: Secondary | ICD-10-CM | POA: Diagnosis not present

## 2016-10-21 DIAGNOSIS — R0600 Dyspnea, unspecified: Secondary | ICD-10-CM | POA: Diagnosis not present

## 2016-11-19 ENCOUNTER — Ambulatory Visit (INDEPENDENT_AMBULATORY_CARE_PROVIDER_SITE_OTHER): Payer: Medicare HMO | Admitting: Pulmonary Disease

## 2016-11-19 ENCOUNTER — Encounter: Payer: Self-pay | Admitting: Pulmonary Disease

## 2016-11-19 DIAGNOSIS — J449 Chronic obstructive pulmonary disease, unspecified: Secondary | ICD-10-CM

## 2016-11-19 NOTE — Assessment & Plan Note (Signed)
No evidence of recurrence of this based on her physical exam today.

## 2016-11-19 NOTE — Assessment & Plan Note (Signed)
Her O2 saturation at rest today on room air was 76%. This improved at rest with 3 L/m. However, it should be noted that she needs 4 L continuous with exertion. For simplicity sake going to recommend she take 4 L continuous 24 hours a day. She would like to change her oxygen supplier company to [employer].

## 2016-11-19 NOTE — Patient Instructions (Signed)
Keep taking your inhaled medicines as you are doing Call [facility] to make arrangements for the graduate program of pulmonary rehabilitation Use 4 L of oxygen continuously We will see you back in 3 months or sooner if needed

## 2016-11-19 NOTE — Assessment & Plan Note (Signed)
She has severe COPD and is profoundly deconditioned. She has not had any exacerbations recently which is a good thing. She would benefit from further exercise in a controlled environment.  Plan: I advised her and her daughter to consider participating in the graduate program of pulmonary rehabilitation at [place] taking Anoro puff daily Stay as active as possible Advised on the importance of good hand hygiene while out of the house Follow-up 3 months

## 2016-11-19 NOTE — Progress Notes (Signed)
Subjective:    Patient ID: Emily Owen, female    DOB: [birth year], 83 y.o.   MRN: [id number]  Synopsis: Referred in 2016 for severe COPD and evaluation of a pleural effusion after an episode of pneumonia. June 2016 pulmonary function testing ratio 49%, FEV1 0.85 L (44% predicted, 9% change postbronchodilator), total lung capacity 5.33 L (103% predicted), DLCO 12.21 (48% predicted)  HPI Chief Complaint  Patient presents with  . Follow-up    c/o worsening sob, fatigue with exertion.  pt's daughter is concerned this is d/t deconditioning. Pt wishes to switch to [place].    Cheryse hasn't been out of the house since Thanksgiving until today.  She uses continuous oxygen when at home.  They want to switch to [employer] for her oxygen supplier.  She is not walking much, she is not exercising.  Apparently her diet consists of junk food mostly.   Ahlexis says she is "lazy".  She would consider doing silver sneakers.    No recent respiratory flare ups.  No recent colds, no pneumonia.   Uses Anoro daily, only seldom use of Combivent.    Past Medical History:  Diagnosis Date  . Cancer ([place])    colon  . Cancer of cervix ([place])    cervix  . COPD (chronic obstructive pulmonary disease) ([place])   . Gallstones   . Gout   . Hard of hearing   . Osteoporosis   . Pleural effusion       Review of Systems  Constitutional: Negative for chills, fatigue and fever.  HENT: Negative for postnasal drip, rhinorrhea and sinus pressure.   Respiratory: Positive for shortness of breath. Negative for cough and wheezing.   Cardiovascular: Negative for chest pain, palpitations and leg swelling.       Objective:   Physical Exam Vitals:   11/19/16 1041  BP: 134/76  Pulse: 61  SpO2: 91%  Weight: 181 lb (82.1 kg)  Height: 5\' 4"  (1.626 m)   O2 saturation 80% on 3L pulse after walking in, then improved to 91% with sitting still and taking deep breaths  Gen: chornically ill appearing HENT: OP  clear, TM's clear, neck supple PULM: Crackles bases, no wheezing B, normal percussion CV: irregular rhythm, normal rate, no mgr, trace edema GI: BS+, soft, nontender Derm: no cyanosis or rash Psyche: normal mood and affect  CBC    Component Value Date/Time   WBC 8.7 03/14/2016 0956   RBC 4.62 03/14/2016 0956   HGB 11.8 (L) 03/14/2016 0956   HCT 37.0 03/14/2016 0956   PLT 295.0 03/14/2016 0956   MCV 80.1 03/14/2016 0956   MCH 26.6 07/11/2015 1220   MCHC 31.8 03/14/2016 0956   RDW 17.3 (H) 03/14/2016 0956   LYMPHSABS 1.4 07/11/2015 1220   MONOABS 1.1 (H) 07/11/2015 1220   EOSABS 0.0 07/11/2015 1220   BASOSABS 0.0 07/11/2015 1220   September 2017 primary care physician visit reviewed where she was noted to have a hemoglobin A1c of 6.6 for diabetes.     Assessment & Plan:  COPD, severe ([place]) She has severe COPD and is profoundly deconditioned. She has not had any exacerbations recently which is a good thing. She would benefit from further exercise in a controlled environment.  Plan: I advised her and her daughter to consider participating in the graduate program of pulmonary rehabilitation at [place] taking Anoro puff daily Stay as active as possible Advised on the importance of good hand hygiene while out of the  house Follow-up 3 months  Chronic respiratory failure with hypercapnia (HCC) Her O2 saturation at rest today on room air was 76%. This improved at rest with 3 L/m. However, it should be noted that she needs 4 L continuous with exertion. For simplicity sake going to recommend she take 4 L continuous 24 hours a day. She would like to change her oxygen supplier company to [employer].  Pleural effusion No evidence of recurrence of this based on her physical exam today.  > 50% of 27 minute visit spent face to face   Current Outpatient Prescriptions:  .  amLODipine (NORVASC) 5 MG tablet, Take 1 tablet (5 mg total) by mouth daily., Disp: 90 tablet, Rfl:  3 .  aspirin EC 81 MG tablet, Take 81 mg by mouth daily., Disp: , Rfl:  .  Cholecalciferol (VITAMIN D) 2000 UNITS CAPS, Take 2,000 Units by mouth daily., Disp: , Rfl:  .  fish oil-omega-3 fatty acids 1000 MG capsule, Take 2 g by mouth 2 (two) times daily., Disp: , Rfl:  .  glucose blood (BAYER CONTOUR NEXT TEST) test strip, Use as instructed, Disp: 100 each, Rfl: 12 .  guaiFENesin (MUCINEX) 600 MG 12 hr tablet, Take 2 tablets (1,200 mg total) by mouth 2 (two) times daily. (Patient taking differently: Take 1,200 mg by mouth 2 (two) times daily as needed. ), Disp: 10 tablet, Rfl: 0 .  Ipratropium-Albuterol (COMBIVENT RESPIMAT) 20-100 MCG/ACT AERS respimat, Inhale 1 puff into the lungs every 6 (six) hours as needed for wheezing., Disp: 1 Inhaler, Rfl: 5 .  Multiple Vitamins-Minerals (PRESERVISION AREDS PO), Take 1 capsule by mouth 2 (two) times daily., Disp: , Rfl:  .  rosuvastatin (CRESTOR) 20 MG tablet, Take 1 tablet (20 mg total) by mouth at bedtime., Disp: 90 tablet, Rfl: 3 .  umeclidinium-vilanterol (ANORO ELLIPTA) 62.5-25 MCG/INH AEPB, Inhale 1 puff into the lungs daily., Disp: 60 each, Rfl: 5

## 2016-11-21 DIAGNOSIS — J90 Pleural effusion, not elsewhere classified: Secondary | ICD-10-CM | POA: Diagnosis not present

## 2016-11-21 DIAGNOSIS — J449 Chronic obstructive pulmonary disease, unspecified: Secondary | ICD-10-CM | POA: Diagnosis not present

## 2016-11-21 DIAGNOSIS — R0600 Dyspnea, unspecified: Secondary | ICD-10-CM | POA: Diagnosis not present

## 2016-11-21 DIAGNOSIS — R0602 Shortness of breath: Secondary | ICD-10-CM | POA: Diagnosis not present

## 2016-11-23 DIAGNOSIS — J449 Chronic obstructive pulmonary disease, unspecified: Secondary | ICD-10-CM | POA: Diagnosis not present

## 2016-11-26 ENCOUNTER — Encounter: Payer: Self-pay | Admitting: Pulmonary Disease

## 2016-11-26 MED ORDER — UMECLIDINIUM-VILANTEROL 62.5-25 MCG/INH IN AEPB: 1.0000 | INHALATION_SPRAY | Freq: Every day | RESPIRATORY_TRACT | 5 refills | Status: DC

## 2016-11-26 NOTE — Telephone Encounter (Signed)
Plan: I advised her and her daughter to consider participating in the graduate program of pulmonary rehabilitation at [place] taking Anoro puff daily Stay as active as possible Advised on the importance of good hand hygiene while out of the house Follow-up 3 months  Anoro was not sent in as requested at her appt. This has been sent to [facility] as requested by the patient in her e-mail.  Patient e-mailed back and was made aware. Nothing further needed.

## 2016-12-04 DIAGNOSIS — Z79891 Long term (current) use of opiate analgesic: Secondary | ICD-10-CM | POA: Diagnosis not present

## 2016-12-04 DIAGNOSIS — M130 Polyarthritis, unspecified: Secondary | ICD-10-CM | POA: Diagnosis not present

## 2016-12-04 DIAGNOSIS — Z0000 Encounter for general adult medical examination without abnormal findings: Secondary | ICD-10-CM | POA: Diagnosis not present

## 2016-12-04 DIAGNOSIS — I10 Essential (primary) hypertension: Secondary | ICD-10-CM | POA: Diagnosis not present

## 2016-12-04 DIAGNOSIS — E782 Mixed hyperlipidemia: Secondary | ICD-10-CM | POA: Diagnosis not present

## 2016-12-04 DIAGNOSIS — K08409 Partial loss of teeth, unspecified cause, unspecified class: Secondary | ICD-10-CM | POA: Diagnosis not present

## 2016-12-04 DIAGNOSIS — Z7982 Long term (current) use of aspirin: Secondary | ICD-10-CM | POA: Diagnosis not present

## 2016-12-04 DIAGNOSIS — J449 Chronic obstructive pulmonary disease, unspecified: Secondary | ICD-10-CM | POA: Diagnosis not present

## 2016-12-04 DIAGNOSIS — Z6830 Body mass index (BMI) 30.0-30.9, adult: Secondary | ICD-10-CM | POA: Diagnosis not present

## 2016-12-04 DIAGNOSIS — E669 Obesity, unspecified: Secondary | ICD-10-CM | POA: Diagnosis not present

## 2016-12-04 DIAGNOSIS — Z9981 Dependence on supplemental oxygen: Secondary | ICD-10-CM | POA: Diagnosis not present

## 2016-12-21 DIAGNOSIS — J449 Chronic obstructive pulmonary disease, unspecified: Secondary | ICD-10-CM | POA: Diagnosis not present

## 2017-01-14 ENCOUNTER — Encounter: Payer: Self-pay | Admitting: Internal Medicine

## 2017-01-14 ENCOUNTER — Ambulatory Visit (INDEPENDENT_AMBULATORY_CARE_PROVIDER_SITE_OTHER): Payer: Medicare HMO | Admitting: Internal Medicine

## 2017-01-14 ENCOUNTER — Other Ambulatory Visit (INDEPENDENT_AMBULATORY_CARE_PROVIDER_SITE_OTHER): Payer: Medicare HMO

## 2017-01-14 VITALS — BP 130/78 | HR 70 | Temp 97.7°F | Resp 16 | Ht 64.0 in | Wt 185.0 lb

## 2017-01-14 DIAGNOSIS — E1151 Type 2 diabetes mellitus with diabetic peripheral angiopathy without gangrene: Secondary | ICD-10-CM | POA: Diagnosis not present

## 2017-01-14 LAB — COMPREHENSIVE METABOLIC PANEL
ALBUMIN: 4.1 g/dL (ref 3.5–5.2)
ALT: 15 U/L (ref 0–35)
AST: 22 U/L (ref 0–37)
Alkaline Phosphatase: 61 U/L (ref 39–117)
BUN: 18 mg/dL (ref 6–23)
CALCIUM: 9.5 mg/dL (ref 8.4–10.5)
CO2: 35 meq/L — AB (ref 19–32)
CREATININE: 0.86 mg/dL (ref 0.40–1.20)
Chloride: 95 mEq/L — ABNORMAL LOW (ref 96–112)
GFR: 66.74 mL/min (ref >60.00)
Glucose, Bld: 187 mg/dL — ABNORMAL HIGH (ref 70–99)
POTASSIUM: 4.2 meq/L (ref 3.5–5.1)
Sodium: 140 mEq/L (ref 135–145)
Total Bilirubin: 0.4 mg/dL (ref 0.2–1.2)
Total Protein: 7.2 g/dL (ref 6.0–8.3)

## 2017-01-14 LAB — LIPID PANEL
CHOLESTEROL: 209 mg/dL — AB (ref 0–200)
HDL: 32 mg/dL — ABNORMAL LOW (ref >39.00)
Total CHOL/HDL Ratio: 7
Triglycerides: 434 mg/dL — ABNORMAL HIGH (ref 0.0–149.0)

## 2017-01-14 LAB — LDL CHOLESTEROL, DIRECT: Direct LDL: 109 mg/dL

## 2017-01-14 LAB — HEMOGLOBIN A1C: HEMOGLOBIN A1C: 7.6 % — AB (ref 4.6–6.5)

## 2017-01-14 MED ORDER — TRAZODONE HCL 50 MG PO TABS: 25.0000 mg | ORAL_TABLET | Freq: Every evening | ORAL | 3 refills | Status: DC | PRN

## 2017-01-14 NOTE — Progress Notes (Signed)
Pre visit review using our clinic review tool, if applicable. No additional management support is needed unless otherwise documented below in the visit note. 

## 2017-01-14 NOTE — Assessment & Plan Note (Addendum)
Checking HgA1c, CMP, and lipid panel. Last HgA1c at goal. She is not on medications at this time. Has not needed any prednisone since last check so hopefully sugars will be stable. Taking statin, not on ACE-I/ARB at this time. Foot exam done.

## 2017-01-14 NOTE — Progress Notes (Signed)
   Subjective:    Patient ID: Emily Owen, female    DOB: 1930/02/03, 83 y.o.   MRN: [id number]  HPI The patient is an 83 YO female coming in for follow up of her diabetes. She is doing well off medications and monitors sugars at home intermittently and they are at goal fasting around 100-110. Denies new numbness or burning in feet or hands. She has not needed any prednisone over the winter which makes almost 1 year. This is complicated by some peripheral vascular disease which is stable. She is not able to walk long distances.   Review of Systems  Constitutional: Negative.   Respiratory: Negative for cough, chest tightness, shortness of breath and wheezing.   Cardiovascular: Negative.   Gastrointestinal: Negative.   Musculoskeletal: Negative.   Skin: Negative.       Objective:   Physical Exam  Constitutional: She is oriented to person, place, and time. She appears well-developed and well-nourished.  HENT:  Head: Normocephalic and atraumatic.  Cardiovascular: Normal rate and regular rhythm.   Pulmonary/Chest: Effort normal.  On oxygen continuous  Abdominal: Soft. She exhibits no distension. There is no tenderness. There is no rebound.  Neurological: She is alert and oriented to person, place, and time. Coordination abnormal.  In wheelchair for most distances  Skin: Skin is warm and dry.  Foot exam done   Vitals:   01/14/17 0923  BP: 130/78  Pulse: 70  Resp: 16  Temp: 97.7 F (36.5 C)  TempSrc: Oral  SpO2: 97%  Weight: 185 lb (83.9 kg)  Height: 5\' 4"  (1.626 m)      Assessment & Plan:

## 2017-01-14 NOTE — Patient Instructions (Signed)
We have sent in trazodone for sleep which you can take. This medicine can take 1 week to get in your system so give it 1 week before you decide it does not work.   We are checking the blood work today and will send you the results.

## 2017-01-21 DIAGNOSIS — J449 Chronic obstructive pulmonary disease, unspecified: Secondary | ICD-10-CM | POA: Diagnosis not present

## 2017-02-05 ENCOUNTER — Encounter: Payer: Self-pay | Admitting: Pulmonary Disease

## 2017-02-05 ENCOUNTER — Ambulatory Visit (INDEPENDENT_AMBULATORY_CARE_PROVIDER_SITE_OTHER)
Admission: RE | Admit: 2017-02-05 | Discharge: 2017-02-05 | Disposition: A | Discharge status: Home / Self Care | Payer: Medicare HMO | Source: Ambulatory Visit | Attending: Pulmonary Disease | Admitting: Pulmonary Disease

## 2017-02-05 ENCOUNTER — Ambulatory Visit (INDEPENDENT_AMBULATORY_CARE_PROVIDER_SITE_OTHER): Payer: Medicare HMO | Admitting: Pulmonary Disease

## 2017-02-05 ENCOUNTER — Other Ambulatory Visit (INDEPENDENT_AMBULATORY_CARE_PROVIDER_SITE_OTHER): Payer: Medicare HMO

## 2017-02-05 VITALS — BP 104/60 | HR 69 | Ht 64.0 in | Wt 186.4 lb

## 2017-02-05 DIAGNOSIS — R0600 Dyspnea, unspecified: Secondary | ICD-10-CM

## 2017-02-05 DIAGNOSIS — R05 Cough: Secondary | ICD-10-CM | POA: Diagnosis not present

## 2017-02-05 LAB — BRAIN NATRIURETIC PEPTIDE: Pro B Natriuretic peptide (BNP): 38 pg/mL (ref 0.0–100.0)

## 2017-02-05 MED ORDER — FLUTICASONE FUROATE 100 MCG/ACT IN AEPB: 1.0000 | INHALATION_SPRAY | Freq: Every day | RESPIRATORY_TRACT | 0 refills | Status: DC

## 2017-02-05 MED ORDER — FUROSEMIDE 40 MG PO TABS: 40.0000 mg | ORAL_TABLET | Freq: Every day | ORAL | 0 refills | Status: DC

## 2017-02-05 NOTE — Progress Notes (Signed)
Subjective:    Patient ID: Emily Owen, female    DOB: December 26, 1929, 83 y.o.   MRN: [id number]  Synopsis: Referred in 2016 for severe COPD and evaluation of a pleural effusion after an episode of pneumonia. June 2016 pulmonary function testing ratio 49%, FEV1 0.85 L (44% predicted, 9% change postbronchodilator), total lung capacity 5.33 L (103% predicted), DLCO 12.21 (48% predicted)  HPI Chief Complaint  Patient presents with  . Follow-up    3 month f/u COPD, pt reports she is doing ok, she has been having some trouble breathing this week, needs albuterol nebs sent to the [facility] says that she is a little more short of breath.  She notes dyspnea with exertion, any exertion.  Her family notes that her fingers and toes are blue.  She is sedentary and she doesn't want to exercise.  She doesn't want to go to the [employer].  She just doesn't have enough air to do any exercise.  She thinks the pollen is making it worse.   > she is out of her albuterol nebulizer> maybe helped a little > she has more chest congestion , taking mucinex for it > no fever or chills > no leg swelling > She is not wheezing more, somedays her daughter will hear it. > She is sleeping a lot more than normal > sometimes she will have environmental triggers > no morning headaches > not sure if she snores > naps 4-5 times per day > she has gained 5 pounds in the last week   Past Medical History:  Diagnosis Date  . Cancer ([place])    colon  . Cancer of cervix ([place])    cervix  . COPD (chronic obstructive pulmonary disease) ([place])   . Gallstones   . Gout   . Hard of hearing   . Osteoporosis   . Pleural effusion       Review of Systems  Constitutional: Negative for chills, fatigue and fever.  HENT: Negative for postnasal drip, rhinorrhea and sinus pressure.   Respiratory: Positive for shortness of breath. Negative for cough and wheezing.   Cardiovascular: Negative for chest pain,  palpitations and leg swelling.       Objective:   Physical Exam Vitals:   02/05/17 1414  BP: 104/60  Pulse: 69  SpO2: 94%  Weight: 186 lb 6.4 oz (84.6 kg)  Height: 5\' 4"  (1.626 m)   O2 saturation 80% on 3L pulse after walking in, then improved to 91% with sitting still and taking deep breaths  Gen: obese, chronically ill appearing HENT: OP clear, TM's clear, neck supple PULM: Crackles bases bilaterally, normal percussion CV: RRR, no mgr, trace edema GI: BS+, soft, nontender Derm: no cyanosis or rash Psyche: normal mood and affect   CBC    Component Value Date/Time   WBC 8.7 03/14/2016 0956   RBC 4.62 03/14/2016 0956   HGB 11.8 (L) 03/14/2016 0956   HCT 37.0 03/14/2016 0956   PLT 295.0 03/14/2016 0956   MCV 80.1 03/14/2016 0956   MCH 26.6 07/11/2015 1220   MCHC 31.8 03/14/2016 0956   RDW 17.3 (H) 03/14/2016 0956   LYMPHSABS 1.4 07/11/2015 1220   MONOABS 1.1 (H) 07/11/2015 1220   EOSABS 0.0 07/11/2015 1220   BASOSABS 0.0 07/11/2015 1220   BMET    Component Value Date/Time   NA 140 01/14/2017 1020   K 4.2 01/14/2017 1020   CL 95 (L) 01/14/2017 1020   CO2 35 (  H) 01/14/2017 1020   GLUCOSE 187 (H) 01/14/2017 1020   BUN 18 01/14/2017 1020   CREATININE 0.86 01/14/2017 1020   CALCIUM 9.5 01/14/2017 1020   GFRNONAA 50 (L) 07/11/2015 1220   GFRAA 58 (L) 07/11/2015 1220     March 2018 visit with primary care physician reviewed were she was cared for for diabetes     Assessment & Plan:  Dyspnea I am concerned about her today because she is been experiencing much more shortness of breath. On physical exam I'm worried that she is volume overloaded as she has crackles in the bases of her lungs and weight is up by 5 pounds compared to last week.  Plan: Check chest x-ray Check pro BNP Lasix 40 mg daily 5 days, 0 refills Follow-up in one week with our nurse practitioner, she may need an echocardiogram and cardiology referral depending on the results of these tests  and her response to treatment.  COPD, severe ([place]) She has severe disease. Because of her worsening dyspnea ongoing to add an inhaled steroid, samples given today. She will continue taking Anoro. Although up in one week.  Chronic respiratory failure with hypercapnia (HCC) Continue using 3 L of oxygen at rest, 4 L with exertion. Continuous-flow, not pulse.     Current Outpatient Prescriptions:  .  amLODipine (NORVASC) 5 MG tablet, Take 1 tablet (5 mg total) by mouth daily., Disp: 90 tablet, Rfl: 3 .  aspirin EC 81 MG tablet, Take 81 mg by mouth daily., Disp: , Rfl:  .  Cholecalciferol (VITAMIN D) 2000 UNITS CAPS, Take 2,000 Units by mouth daily., Disp: , Rfl:  .  fish oil-omega-3 fatty acids 1000 MG capsule, Take 2 g by mouth 2 (two) times daily., Disp: , Rfl:  .  glucose blood (BAYER CONTOUR NEXT TEST) test strip, Use as instructed, Disp: 100 each, Rfl: 12 .  guaiFENesin (MUCINEX) 600 MG 12 hr tablet, Take 2 tablets (1,200 mg total) by mouth 2 (two) times daily. (Patient taking differently: Take 1,200 mg by mouth 2 (two) times daily as needed. ), Disp: 10 tablet, Rfl: 0 .  Ipratropium-Albuterol (COMBIVENT RESPIMAT) 20-100 MCG/ACT AERS respimat, Inhale 1 puff into the lungs every 6 (six) hours as needed for wheezing., Disp: 1 Inhaler, Rfl: 5 .  Multiple Vitamins-Minerals (PRESERVISION AREDS PO), Take 1 capsule by mouth 2 (two) times daily., Disp: , Rfl:  .  rosuvastatin (CRESTOR) 20 MG tablet, Take 1 tablet (20 mg total) by mouth at bedtime., Disp: 90 tablet, Rfl: 3 .  traZODone (DESYREL) 50 MG tablet, Take 0.5-1 tablets (25-50 mg total) by mouth at bedtime as needed for sleep., Disp: 30 tablet, Rfl: 3 .  umeclidinium-vilanterol (ANORO ELLIPTA) 62.5-25 MCG/INH AEPB, Inhale 1 puff into the lungs daily., Disp: 60 each, Rfl: 5

## 2017-02-05 NOTE — Assessment & Plan Note (Signed)
She has severe disease. Because of her worsening dyspnea ongoing to add an inhaled steroid, samples given today. She will continue taking Anoro. Although up in one week.

## 2017-02-05 NOTE — Assessment & Plan Note (Signed)
Continue using 3 L of oxygen at rest, 4 L with exertion. Continuous-flow, not pulse.

## 2017-02-05 NOTE — Patient Instructions (Signed)
We will call you with the results of the chest x-ray in the bloodwork Take Arnuity 1 puff daily no matter how you feel Keep taking a nor oh Use 3 L of oxygen at rest, 4 L with exertion We will see you back in one week with our nurse practitioner Take Lasix 1 tablet daily for the next 5 days

## 2017-02-05 NOTE — Assessment & Plan Note (Signed)
I am concerned about her today because she is been experiencing much more shortness of breath. On physical exam I'm worried that she is volume overloaded as she has crackles in the bases of her lungs and weight is up by 5 pounds compared to last week.  Plan: Check chest x-ray Check pro BNP Lasix 40 mg daily 5 days, 0 refills Follow-up in one week with our nurse practitioner, she may need an echocardiogram and cardiology referral depending on the results of these tests and her response to treatment.

## 2017-02-11 ENCOUNTER — Encounter: Payer: Self-pay | Admitting: Pulmonary Disease

## 2017-02-11 ENCOUNTER — Encounter: Payer: Self-pay | Admitting: Internal Medicine

## 2017-02-12 ENCOUNTER — Ambulatory Visit: Payer: Medicare HMO | Admitting: Adult Health

## 2017-02-13 ENCOUNTER — Telehealth: Payer: Self-pay | Admitting: Pulmonary Disease

## 2017-02-13 DIAGNOSIS — R5383 Other fatigue: Secondary | ICD-10-CM

## 2017-02-13 NOTE — Telephone Encounter (Signed)
Order was placed for pt to have home sleep study but I believe she is on O2 at night?  We can't do hst with O2.  Order needs to be place for study at [facility] is this is the case.

## 2017-02-20 ENCOUNTER — Ambulatory Visit (INDEPENDENT_AMBULATORY_CARE_PROVIDER_SITE_OTHER): Payer: Medicare HMO | Admitting: Adult Health

## 2017-02-20 ENCOUNTER — Encounter: Payer: Self-pay | Admitting: Adult Health

## 2017-02-20 DIAGNOSIS — J449 Chronic obstructive pulmonary disease, unspecified: Secondary | ICD-10-CM

## 2017-02-20 DIAGNOSIS — J9612 Chronic respiratory failure with hypercapnia: Secondary | ICD-10-CM | POA: Diagnosis not present

## 2017-02-20 NOTE — Telephone Encounter (Signed)
BQ please advise. thanks 

## 2017-02-20 NOTE — Progress Notes (Signed)
@Patient  ID: Emily Owen, female    DOB: Jun 05, 1930, 83 y.o.   MRN: [id number]  Chief Complaint  Patient presents with  . Follow-up    COPD     Referring provider: Hoyt Koch, *  HPI: 83 yo female followed for severe COPD   TEST  June 2016 pulmonary function testing ratio 49%, FEV1 0.85 L (44% predicted, 9% change postbronchodilator), total lung capacity 5.33 L (103% predicted), DLCO 12.21 (48% predicted)  02/21/2017  Follow up : COPD  Pt returns for a 2 week follow up . Seen last ov with increased sob and congestion .  Arnuity was added to her regimen . She did have some edema and wt gain . She was given lasix for 5 days . She is feeling better with decreased ankle edema and less dyspnea. Feels the Arnuity is helping . Would like to try Trelegy (she is on [facility] ) . She denies fever, chest pain or orthopnea. Labs last ov showed nml bnp . CXR showed chronic changes.    On O2 at 3l/m .   No Known Allergies  Immunization History  Administered Date(s) Administered  . Influenza Whole 07/24/2014  . Influenza, High Dose Seasonal PF 07/17/2016  . Influenza,inj,Quad PF,36+ Mos 07/05/2015  . Pneumococcal Conjugate-13 08/24/2014  . Pneumococcal Polysaccharide-23 03/24/2005    Past Medical History:  Diagnosis Date  . Cancer ([place])    colon  . Cancer of cervix ([place])    cervix  . COPD (chronic obstructive pulmonary disease) ([place])   . Gallstones   . Gout   . Hard of hearing   . Osteoporosis   . Pleural effusion     Tobacco History: History  Smoking Status  . Former Smoker  . Packs/day: 2.00  . Years: 35.00  . Types: Cigarettes  . Quit date: 08/11/1994  Smokeless Tobacco  . Never Used   Counseling given: Not Answered   Outpatient Encounter Prescriptions as of 02/20/2017  Medication Sig  . amLODipine (NORVASC) 5 MG tablet Take 1 tablet (5 mg total) by mouth daily.  Marland Kitchen aspirin EC 81 MG tablet Take 81 mg by mouth daily.  . Cholecalciferol (VITAMIN D) 2000  UNITS CAPS Take 2,000 Units by mouth daily.  . fish oil-omega-3 fatty acids 1000 MG capsule Take 2 g by mouth 2 (two) times daily.  . furosemide (LASIX) 40 MG tablet Take 1 tablet (40 mg total) by mouth daily.  Marland Kitchen glucose blood (BAYER CONTOUR NEXT TEST) test strip Use as instructed  . guaiFENesin (MUCINEX) 600 MG 12 hr tablet Take 2 tablets (1,200 mg total) by mouth 2 (two) times daily. (Patient taking differently: Take 1,200 mg by mouth 2 (two) times daily as needed. )  . Ipratropium-Albuterol (COMBIVENT RESPIMAT) 20-100 MCG/ACT AERS respimat Inhale 1 puff into the lungs every 6 (six) hours as needed for wheezing.  . Multiple Vitamins-Minerals (PRESERVISION AREDS PO) Take 1 capsule by mouth 2 (two) times daily.  . rosuvastatin (CRESTOR) 20 MG tablet Take 1 tablet (20 mg total) by mouth at bedtime.  . traZODone (DESYREL) 50 MG tablet Take 0.5-1 tablets (25-50 mg total) by mouth at bedtime as needed for sleep.  . [DISCONTINUED] Fluticasone Furoate (ARNUITY ELLIPTA) 100 MCG/ACT AEPB Inhale 1 puff into the lungs daily. (Patient not taking: Reported on 02/20/2017)  . [DISCONTINUED] umeclidinium-vilanterol (ANORO ELLIPTA) 62.5-25 MCG/INH AEPB Inhale 1 puff into the lungs daily. (Patient not taking: Reported on 02/20/2017)   No facility-administered encounter medications on file as of 02/20/2017.  Review of Systems  Constitutional:   No  weight loss, night sweats,  Fevers, chills,  +fatigue, or  lassitude.  HEENT:   No headaches,  Difficulty swallowing,  Tooth/dental problems, or  Sore throat,                No sneezing, itching, ear ache,  +nasal congestion, post nasal drip,   CV:  No chest pain,  Orthopnea, PND, swelling in lower extremities, anasarca, dizziness, palpitations, syncope.   GI  No heartburn, indigestion, abdominal pain, nausea, vomiting, diarrhea, change in bowel habits, loss of appetite, bloody stools.   Resp: .  No chest wall deformity  Skin: no rash or lesions.  GU: no  dysuria, change in color of urine, no urgency or frequency.  No flank pain, no hematuria   MS:  No joint pain or swelling.  No decreased range of motion.  No back pain.    Physical Exam  BP 102/64 (BP Location: Right Arm, Cuff Size: Normal)   Pulse (!) 56   SpO2 97%   GEN: A/Ox3; pleasant , NAD, elderly     HEENT:  [place]/AT,  EACs-clear, TMs-wnl, NOSE-clear, THROAT-clear, no lesions, no postnasal drip or exudate noted.   NECK:  Supple w/ fair ROM; no JVD; normal carotid impulses w/o bruits; no thyromegaly or nodules palpated; no lymphadenopathy.    RESP  Decreased BS in bases  no accessory muscle use, no dullness to percussion  CARD:  RRR, no m/r/g, no peripheral edema, pulses intact, no cyanosis or clubbing.  GI:   Soft & nt; nml bowel sounds; no organomegaly or masses detected.   Musco: Warm bil, no deformities or joint swelling noted.   Neuro: alert, no focal deficits noted.    Skin: Warm, no lesions or rashes    Lab Results:  CBC  Imaging: Dg Chest 2 View  Result Date: 02/05/2017 CLINICAL DATA:  Dyspnea.  Cough and congestion EXAM: CHEST  2 VIEW COMPARISON:  07/17/2016 FINDINGS: Chronic pleural thickening in the right base is unchanged. There is associated chronic scarring in the right lung base also unchanged. Left lung is clear. Negative for heart failure or pneumonia. Pulmonary hyperinflation. IMPRESSION: Chronic pleural and parenchymal scarring in the right lung base. No acute abnormality. Electronically Signed   By: Franchot Gallo M.D.   On: 02/05/2017 20:03     Assessment & Plan:   No problem-specific Assessment & Plan notes found for this encounter.     Rexene Edison, NP 02/20/17

## 2017-02-20 NOTE — Patient Instructions (Addendum)
You can stop ANORO  And ARNUITY  Begin TRELEGY 1 puff daily , rinse after use.  Continue on Oxygen 3l/m .  May use Albuterol neb every 4hr as needed.  Follow up Dr. [place] Bells 3-4 months and As needed

## 2017-02-21 NOTE — Assessment & Plan Note (Signed)
Cont on O2 at 3l/m

## 2017-02-21 NOTE — Assessment & Plan Note (Signed)
Improved COPD sx control on present regimen  Plan  Patient Instructions  You can stop ANORO  And ARNUITY  Begin TRELEGY 1 puff daily , rinse after use.  Continue on Oxygen 3l/m .  May use Albuterol neb every 4hr as needed.  Follow up Dr. [place] Bells 3-4 months and As needed

## 2017-02-25 NOTE — Telephone Encounter (Signed)
OK, order through a sleep lab

## 2017-02-25 NOTE — Telephone Encounter (Signed)
In lab sleep study has been ordered. Nothing further needed.

## 2017-03-07 ENCOUNTER — Ambulatory Visit: Payer: Medicare HMO | Attending: Pulmonary Disease | Admitting: Pulmonary Disease

## 2017-03-07 DIAGNOSIS — J9611 Chronic respiratory failure with hypoxia: Secondary | ICD-10-CM | POA: Insufficient documentation

## 2017-03-07 DIAGNOSIS — R5383 Other fatigue: Secondary | ICD-10-CM | POA: Diagnosis not present

## 2017-03-07 DIAGNOSIS — J449 Chronic obstructive pulmonary disease, unspecified: Secondary | ICD-10-CM | POA: Insufficient documentation

## 2017-03-07 DIAGNOSIS — G4736 Sleep related hypoventilation in conditions classified elsewhere: Secondary | ICD-10-CM | POA: Diagnosis not present

## 2017-03-14 ENCOUNTER — Other Ambulatory Visit: Payer: Self-pay

## 2017-03-14 ENCOUNTER — Encounter: Payer: Self-pay | Admitting: Pulmonary Disease

## 2017-03-14 DIAGNOSIS — R69 Illness, unspecified: Secondary | ICD-10-CM | POA: Diagnosis not present

## 2017-03-14 MED ORDER — FLUTICASONE-UMECLIDIN-VILANT 100-62.5-25 MCG/INH IN AEPB: 1.0000 | INHALATION_SPRAY | Freq: Every day | RESPIRATORY_TRACT | 6 refills | Status: DC

## 2017-03-14 MED ORDER — ALBUTEROL SULFATE (2.5 MG/3ML) 0.083% IN NEBU: 2.5000 mg | INHALATION_SOLUTION | RESPIRATORY_TRACT | 6 refills | Status: DC | PRN

## 2017-03-14 NOTE — Procedures (Signed)
Patient Name: Emily Owen, Emily Owen Date: 03/07/2017 Gender: Female D.O.B: [birth year] Age (years): 86 Referring Provider: Simonne Maffucci Height (inches): 80 Interpreting Physician: Kara Mead MD, ABSM Weight (lbs): 186 RPSGT: Peak, Robert BMI: 32 MRN: [id number] Neck Size: 16.00  CLINICAL INFORMATION Sleep Study Type: NPSG  Indication for sleep study: Fatigue, COPD with chronic respiratory failure on 3 L oxygen  Epworth Sleepiness Score: 14     SLEEP STUDY TECHNIQUE As per the AASM Manual for the Scoring of Sleep and Associated Events v2.3 (April 2016) with a hypopnea requiring 4% desaturations.  The channels recorded and monitored were frontal, central and occipital EEG, electrooculogram (EOG), submentalis EMG (chin), nasal and oral airflow, thoracic and abdominal wall motion, anterior tibialis EMG, snore microphone, electrocardiogram, and pulse oximetry.  SLEEP ARCHITECTURE The study was initiated at 9:42:55 PM and ended at 3:45:51 AM.  Sleep onset time was 32.1 minutes and the sleep efficiency was 3.7%. The total sleep time was 13.5 minutes.  Stage REM latency was N/A minutes.  The patient spent 100.00% of the night in stage N1 sleep, 0.00% in stage N2 sleep, 0.00% in stage N3 and 0.00% in REM.  Alpha intrusion was absent.  Supine sleep was 0.00%.  RESPIRATORY PARAMETERS The overall apnea/hypopnea index (AHI) was 0.0 per hour. There were 0 total apneas, including 0 obstructive, 0 central and 0 mixed apneas. There were 0 hypopneas and 1 RERAs.  The AHI during Stage REM sleep was N/A per hour.  AHI while supine was N/A per hour.  The mean oxygen saturation was 86.43%. The minimum SpO2 during sleep was 85.00%.  snoring was noted during this study.  CARDIAC DATA The 2 lead EKG demonstrated sinus rhythm. The mean heart rate was 70.55 beats per minute. Other EKG findings include: Atrial Fibrillation, PVCs.   LEG MOVEMENT DATA The total PLMS were 0 with a  resulting PLMS index of 0.00. Associated arousal with leg movement index was 0.0 .  IMPRESSIONS - No significant observations can be made since only 30 minutes of sleep were noted - No significant obstructive sleep apnea occurred during this study (AHI = 0.0/h). - No significant central sleep apnea occurred during this study (CAI = 0.0/h). - Moderate oxygen desaturation was noted during this study (Min O2 = 85.00%). - No snoring was audible during this study. - EKG findings include Atrial Fibrillation, PVCs. - Clinically significant periodic limb movements did not occur during sleep. No significant associated arousals.   DIAGNOSIS - Nocturnal Hypoxemia (327.26 [G47.36 ICD-10]) - COPD with chronic respiratory failure on 3 L oxygen   RECOMMENDATIONS - This study is inadequate with total sleep time only 13 minutes. Consider repeating study if clinically indicated. - Use 4 L of oxygen during sleep - Avoid alcohol, sedatives and other CNS depressants that may worsen sleep apnea and disrupt normal sleep architecture. - Sleep hygiene should be reviewed to assess factors that may improve sleep quality. - Weight management and regular exercise should be initiated or continued if appropriate.  [Electronically signed] 03/14/2017 11:50 AM  Kara Mead MD, [place], American Board of Sleep Medicine   NPI: [id number]

## 2017-03-18 ENCOUNTER — Other Ambulatory Visit: Payer: Self-pay | Admitting: Pulmonary Disease

## 2017-03-19 NOTE — Telephone Encounter (Signed)
Per TP- Can wait for BQ's recs.    BQ please advise if ok to switch back to Anoro since Trelegy is not covered by insurance.  Thanks.

## 2017-03-19 NOTE — Telephone Encounter (Signed)
Emily Owen please advise - Pt insurance is not covering the Trelegy and they are asking that she be switched back to Anoro.   Pt was switched last OV off the Anoro and Arnuity d/t increased SOB and congestion. Please advise if restarting the previous combo would be adequate or if trying another inhaler would be more beneficial. Thanks.   02/21/2017  Follow up : COPD  Pt returns for a 2 week follow up . Seen last ov with increased sob and congestion .  Arnuity was added to her regimen . She did have some edema and wt gain . She was given lasix for 5 days . She is feeling better with decreased ankle edema and less dyspnea. Feels the Arnuity is helping . Would like to try Trelegy (she is on [facility] ) . She denies fever, chest pain or orthopnea. Labs last ov showed nml bnp . CXR showed chronic changes.    On O2 at 3l/m .

## 2017-03-21 MED ORDER — UMECLIDINIUM-VILANTEROL 62.5-25 MCG/INH IN AEPB: 1.0000 | INHALATION_SPRAY | Freq: Every day | RESPIRATORY_TRACT | 5 refills | Status: DC

## 2017-03-22 NOTE — Progress Notes (Signed)
Spoke with pt and notified of results per BQ. Pt verbalized understanding and denied any questions. She states she does not want to repeat PSG now, and will discuss this further at f/u with BQ in Sept.

## 2017-03-23 DIAGNOSIS — J449 Chronic obstructive pulmonary disease, unspecified: Secondary | ICD-10-CM | POA: Diagnosis not present

## 2017-04-22 DIAGNOSIS — J449 Chronic obstructive pulmonary disease, unspecified: Secondary | ICD-10-CM | POA: Diagnosis not present

## 2017-05-23 DIAGNOSIS — J449 Chronic obstructive pulmonary disease, unspecified: Secondary | ICD-10-CM | POA: Diagnosis not present

## 2017-06-23 DIAGNOSIS — J449 Chronic obstructive pulmonary disease, unspecified: Secondary | ICD-10-CM | POA: Diagnosis not present

## 2017-07-16 ENCOUNTER — Ambulatory Visit (INDEPENDENT_AMBULATORY_CARE_PROVIDER_SITE_OTHER): Payer: Medicare HMO | Admitting: Pulmonary Disease

## 2017-07-16 ENCOUNTER — Encounter: Payer: Self-pay | Admitting: Pulmonary Disease

## 2017-07-16 VITALS — BP 126/64 | HR 60 | Ht 64.0 in | Wt 179.6 lb

## 2017-07-16 DIAGNOSIS — J449 Chronic obstructive pulmonary disease, unspecified: Secondary | ICD-10-CM

## 2017-07-16 DIAGNOSIS — Z23 Encounter for immunization: Secondary | ICD-10-CM | POA: Diagnosis not present

## 2017-07-16 DIAGNOSIS — J9612 Chronic respiratory failure with hypercapnia: Secondary | ICD-10-CM | POA: Diagnosis not present

## 2017-07-16 MED ORDER — FLUTICASONE FUROATE 100 MCG/ACT IN AEPB: 1.0000 | INHALATION_SPRAY | Freq: Every day | RESPIRATORY_TRACT | 5 refills | Status: DC

## 2017-07-16 NOTE — Progress Notes (Signed)
Subjective:    Patient ID: Emily Owen, female    DOB: [birth year], 83 y.o.   MRN: [id number]  Synopsis: Referred in 2016 for severe COPD and evaluation of a pleural effusion after an episode of pneumonia. June 2016 pulmonary function testing ratio 49%, FEV1 0.85 L (44% predicted, 9% change postbronchodilator), total lung capacity 5.33 L (103% predicted), DLCO 12.21 (48% predicted)  HPI Chief Complaint  Patient presents with  . Follow-up    pt states her breathing is stable, sob with exertion, stable sometimes prod cough.     [name] is glad that the summer is over, she doesn't like hot weather. Her breathing has been about the same. Her insurance doesn't cover Arnuity. No bronchitis or pneumonia. No leg swelling.  She barely uses combivent or albuterol neb. She is not doing any activity, none.    Past Medical History:  Diagnosis Date  . Cancer ([place])    colon  . Cancer of cervix ([place])    cervix  . COPD (chronic obstructive pulmonary disease) ([place])   . Gallstones   . Gout   . Hard of hearing   . Osteoporosis   . Pleural effusion       Review of Systems  Constitutional: Negative for chills, fatigue and fever.  HENT: Negative for postnasal drip, rhinorrhea and sinus pressure.   Respiratory: Positive for shortness of breath. Negative for cough and wheezing.   Cardiovascular: Negative for chest pain, palpitations and leg swelling.       Objective:   Physical Exam Vitals:   07/16/17 0854  BP: 126/64  Pulse: 60  SpO2: 93%  Weight: 179 lb 9.6 oz (81.5 kg)  Height: 5\' 4"  (1.626 m)    Gen: chronically ill appearing, in wheelchair HENT: OP clear, TM's clear, neck supple PULM: Few crackles R base, normal percussion CV: RRR, no mgr, trace edema GI: BS+, soft, nontender Derm: no cyanosis or rash Psyche: normal mood and affect    CBC    Component Value Date/Time   WBC 8.7 03/14/2016 0956   RBC 4.62 03/14/2016 0956   HGB 11.8 (L) 03/14/2016 0956   HCT  37.0 03/14/2016 0956   PLT 295.0 03/14/2016 0956   MCV 80.1 03/14/2016 0956   MCH 26.6 07/11/2015 1220   MCHC 31.8 03/14/2016 0956   RDW 17.3 (H) 03/14/2016 0956   LYMPHSABS 1.4 07/11/2015 1220   MONOABS 1.1 (H) 07/11/2015 1220   EOSABS 0.0 07/11/2015 1220   BASOSABS 0.0 07/11/2015 1220   BMET    Component Value Date/Time   NA 140 01/14/2017 1020   K 4.2 01/14/2017 1020   CL 95 (L) 01/14/2017 1020   CO2 35 (H) 01/14/2017 1020   GLUCOSE 187 (H) 01/14/2017 1020   BUN 18 01/14/2017 1020   CREATININE 0.86 01/14/2017 1020   CALCIUM 9.5 01/14/2017 1020   GFRNONAA 50 (L) 07/11/2015 1220   GFRAA 58 (L) 07/11/2015 1220    Last record from our pulmonary NP noted, changed to Trelegy     Assessment & Plan:    COPD, severe ([place])  Chronic respiratory failure with hypercapnia ([place])  Discussion: This has been a stable interval for Mrs. Randol Kern. She has not had an exacerbation since the last visit. She remains quite deconditioned and refuses to participate in any sort of exercise. Fortunately she has not had an exacerbation. She continues to use and benefit from her oxygen.  Plan: COPD: Continue taking Anoro once daily Start using Arnuity once daily Continue using  albuterol and Combivent as needed for shortness of breath Flu shot today Try to stay as active as possible  Chronic respiratory failure with hypoxemia: Continue using 3 L of oxygen continuously  We will see you back in 4-6 months or sooner if needed    Current Outpatient Prescriptions:  .  albuterol (PROVENTIL) (2.5 MG/3ML) 0.083% nebulizer solution, Take 3 mLs (2.5 mg total) by nebulization every 4 (four) hours as needed for wheezing or shortness of breath., Disp: 115 mL, Rfl: 6 .  amLODipine (NORVASC) 5 MG tablet, Take 1 tablet (5 mg total) by mouth daily., Disp: 90 tablet, Rfl: 3 .  aspirin EC 81 MG tablet, Take 81 mg by mouth daily., Disp: , Rfl:  .  Cholecalciferol (VITAMIN D) 2000 UNITS CAPS, Take 2,000 Units  by mouth daily., Disp: , Rfl:  .  fish oil-omega-3 fatty acids 1000 MG capsule, Take 2 g by mouth 2 (two) times daily., Disp: , Rfl:  .  furosemide (LASIX) 40 MG tablet, Take 1 tablet (40 mg total) by mouth daily., Disp: 5 tablet, Rfl: 0 .  glucose blood (BAYER CONTOUR NEXT TEST) test strip, Use as instructed, Disp: 100 each, Rfl: 12 .  guaiFENesin (MUCINEX) 600 MG 12 hr tablet, Take 2 tablets (1,200 mg total) by mouth 2 (two) times daily. (Patient taking differently: Take 1,200 mg by mouth 2 (two) times daily as needed. ), Disp: 10 tablet, Rfl: 0 .  Ipratropium-Albuterol (COMBIVENT RESPIMAT) 20-100 MCG/ACT AERS respimat, Inhale 1 puff into the lungs every 6 (six) hours as needed for wheezing., Disp: 1 Inhaler, Rfl: 5 .  Multiple Vitamins-Minerals (PRESERVISION AREDS PO), Take 1 capsule by mouth 2 (two) times daily., Disp: , Rfl:  .  rosuvastatin (CRESTOR) 20 MG tablet, Take 1 tablet (20 mg total) by mouth at bedtime., Disp: 90 tablet, Rfl: 3 .  traZODone (DESYREL) 50 MG tablet, Take 0.5-1 tablets (25-50 mg total) by mouth at bedtime as needed for sleep., Disp: 30 tablet, Rfl: 3 .  umeclidinium-vilanterol (ANORO ELLIPTA) 62.5-25 MCG/INH AEPB, Inhale 1 puff into the lungs daily., Disp: 60 each, Rfl: 5

## 2017-07-16 NOTE — Patient Instructions (Signed)
COPD: Continue taking Anoro once daily Start using Arnuity once daily Continue using albuterol and Combivent as needed for shortness of breath Flu shot today Try to stay as active as possible  Chronic respiratory failure with hypoxemia: Continue using 3 L of oxygen continuously  We will see you back in 4-6 months or sooner if needed

## 2017-07-17 ENCOUNTER — Encounter: Payer: Self-pay | Admitting: Internal Medicine

## 2017-07-17 ENCOUNTER — Ambulatory Visit (INDEPENDENT_AMBULATORY_CARE_PROVIDER_SITE_OTHER): Payer: Medicare HMO | Admitting: Internal Medicine

## 2017-07-17 VITALS — BP 140/70 | HR 61 | Temp 98.6°F | Ht 64.0 in | Wt 179.6 lb

## 2017-07-17 DIAGNOSIS — E1151 Type 2 diabetes mellitus with diabetic peripheral angiopathy without gangrene: Secondary | ICD-10-CM | POA: Diagnosis not present

## 2017-07-17 DIAGNOSIS — Z0000 Encounter for general adult medical examination without abnormal findings: Secondary | ICD-10-CM

## 2017-07-17 DIAGNOSIS — E785 Hyperlipidemia, unspecified: Secondary | ICD-10-CM | POA: Diagnosis not present

## 2017-07-17 DIAGNOSIS — I10 Essential (primary) hypertension: Secondary | ICD-10-CM | POA: Diagnosis not present

## 2017-07-17 NOTE — Patient Instructions (Signed)
Think about getting the shingles vaccine called shingrix.   Health Maintenance, Female Adopting a healthy lifestyle and getting preventive care can go a long way to promote health and wellness. Talk with your health care provider about what schedule of regular examinations is right for you. This is a good chance for you to check in with your provider about disease prevention and staying healthy. In between checkups, there are plenty of things you can do on your own. Experts have done a lot of research about which lifestyle changes and preventive measures are most likely to keep you healthy. Ask your health care provider for more information. Weight and diet Eat a healthy diet  Be sure to include plenty of vegetables, fruits, low-fat dairy products, and lean protein.  Do not eat a lot of foods high in solid fats, added sugars, or salt.  Get regular exercise. This is one of the most important things you can do for your health. ? Most adults should exercise for at least 150 minutes each week. The exercise should increase your heart rate and make you sweat (moderate-intensity exercise). ? Most adults should also do strengthening exercises at least twice a week. This is in addition to the moderate-intensity exercise.  Maintain a healthy weight  Body mass index (BMI) is a measurement that can be used to identify possible weight problems. It estimates body fat based on height and weight. Your health care provider can help determine your BMI and help you achieve or maintain a healthy weight.  For females 42 years of age and older: ? A BMI below 18.5 is considered underweight. ? A BMI of 18.5 to 24.9 is normal. ? A BMI of 25 to 29.9 is considered overweight. ? A BMI of 30 and above is considered obese.  Watch levels of cholesterol and blood lipids  You should start having your blood tested for lipids and cholesterol at 81 years of age, then have this test every 5 years.  You may need to have  your cholesterol levels checked more often if: ? Your lipid or cholesterol levels are high. ? You are older than 81 years of age. ? You are at high risk for heart disease.  Cancer screening Lung Cancer  Lung cancer screening is recommended for adults 80-61 years old who are at high risk for lung cancer because of a history of smoking.  A yearly low-dose CT scan of the lungs is recommended for people who: ? Currently smoke. ? Have quit within the past 15 years. ? Have at least a 30-pack-year history of smoking. A pack year is smoking an average of one pack of cigarettes a day for 1 year.  Yearly screening should continue until it has been 15 years since you quit.  Yearly screening should stop if you develop a health problem that would prevent you from having lung cancer treatment.  Breast Cancer  Practice breast self-awareness. This means understanding how your breasts normally appear and feel.  It also means doing regular breast self-exams. Let your health care provider know about any changes, no matter how small.  If you are in your 20s or 30s, you should have a clinical breast exam (CBE) by a health care provider every 1-3 years as part of a regular health exam.  If you are 20 or older, have a CBE every year. Also consider having a breast X-ray (mammogram) every year.  If you have a family history of breast cancer, talk to your health care provider  about genetic screening.  If you are at high risk for breast cancer, talk to your health care provider about having an MRI and a mammogram every year.  Breast cancer gene (BRCA) assessment is recommended for women who have family members with BRCA-related cancers. BRCA-related cancers include: ? Breast. ? Ovarian. ? Tubal. ? Peritoneal cancers.  Results of the assessment will determine the need for genetic counseling and BRCA1 and BRCA2 testing.  Cervical Cancer Your health care provider may recommend that you be screened  regularly for cancer of the pelvic organs (ovaries, uterus, and vagina). This screening involves a pelvic examination, including checking for microscopic changes to the surface of your cervix (Pap test). You may be encouraged to have this screening done every 3 years, beginning at age 58.  For women ages 62-65, health care providers may recommend pelvic exams and Pap testing every 3 years, or they may recommend the Pap and pelvic exam, combined with testing for human papilloma virus (HPV), every 5 years. Some types of HPV increase your risk of cervical cancer. Testing for HPV may also be done on women of any age with unclear Pap test results.  Other health care providers may not recommend any screening for nonpregnant women who are considered low risk for pelvic cancer and who do not have symptoms. Ask your health care provider if a screening pelvic exam is right for you.  If you have had past treatment for cervical cancer or a condition that could lead to cancer, you need Pap tests and screening for cancer for at least 20 years after your treatment. If Pap tests have been discontinued, your risk factors (such as having a new sexual partner) need to be reassessed to determine if screening should resume. Some women have medical problems that increase the chance of getting cervical cancer. In these cases, your health care provider may recommend more frequent screening and Pap tests.  Colorectal Cancer  This type of cancer can be detected and often prevented.  Routine colorectal cancer screening usually begins at 81 years of age and continues through 81 years of age.  Your health care provider may recommend screening at an earlier age if you have risk factors for colon cancer.  Your health care provider may also recommend using home test kits to check for hidden blood in the stool.  A small camera at the end of a tube can be used to examine your colon directly (sigmoidoscopy or colonoscopy). This is  done to check for the earliest forms of colorectal cancer.  Routine screening usually begins at age 6.  Direct examination of the colon should be repeated every 5-10 years through 81 years of age. However, you may need to be screened more often if early forms of precancerous polyps or small growths are found.  Skin Cancer  Check your skin from head to toe regularly.  Tell your health care provider about any new moles or changes in moles, especially if there is a change in a mole's shape or color.  Also tell your health care provider if you have a mole that is larger than the size of a pencil eraser.  Always use sunscreen. Apply sunscreen liberally and repeatedly throughout the day.  Protect yourself by wearing long sleeves, pants, a wide-brimmed hat, and sunglasses whenever you are outside.  Heart disease, diabetes, and high blood pressure  High blood pressure causes heart disease and increases the risk of stroke. High blood pressure is more likely to develop in: ?  People who have blood pressure in the high end of the normal range (130-139/85-89 mm Hg). ? People who are overweight or obese. ? People who are African American.  If you are 18-39 years of age, have your blood pressure checked every 3-5 years. If you are 40 years of age or older, have your blood pressure checked every year. You should have your blood pressure measured twice-once when you are at a hospital or clinic, and once when you are not at a hospital or clinic. Record the average of the two measurements. To check your blood pressure when you are not at a hospital or clinic, you can use: ? An automated blood pressure machine at a pharmacy. ? A home blood pressure monitor.  If you are between 55 years and 79 years old, ask your health care provider if you should take aspirin to prevent strokes.  Have regular diabetes screenings. This involves taking a blood sample to check your fasting blood sugar level. ? If you are  at a normal weight and have a low risk for diabetes, have this test once every three years after 81 years of age. ? If you are overweight and have a high risk for diabetes, consider being tested at a younger age or more often. Preventing infection Hepatitis B  If you have a higher risk for hepatitis B, you should be screened for this virus. You are considered at high risk for hepatitis B if: ? You were born in a country where hepatitis B is common. Ask your health care provider which countries are considered high risk. ? Your parents were born in a high-risk country, and you have not been immunized against hepatitis B (hepatitis B vaccine). ? You have HIV or AIDS. ? You use needles to inject street drugs. ? You live with someone who has hepatitis B. ? You have had sex with someone who has hepatitis B. ? You get hemodialysis treatment. ? You take certain medicines for conditions, including cancer, organ transplantation, and autoimmune conditions.  Hepatitis C  Blood testing is recommended for: ? Everyone born from 1945 through 1965. ? Anyone with known risk factors for hepatitis C.  Sexually transmitted infections (STIs)  You should be screened for sexually transmitted infections (STIs) including gonorrhea and chlamydia if: ? You are sexually active and are younger than 81 years of age. ? You are older than 81 years of age and your health care provider tells you that you are at risk for this type of infection. ? Your sexual activity has changed since you were last screened and you are at an increased risk for chlamydia or gonorrhea. Ask your health care provider if you are at risk.  If you do not have HIV, but are at risk, it may be recommended that you take a prescription medicine daily to prevent HIV infection. This is called pre-exposure prophylaxis (PrEP). You are considered at risk if: ? You are sexually active and do not regularly use condoms or know the HIV status of your  partner(s). ? You take drugs by injection. ? You are sexually active with a partner who has HIV.  Talk with your health care provider about whether you are at high risk of being infected with HIV. If you choose to begin PrEP, you should first be tested for HIV. You should then be tested every 3 months for as long as you are taking PrEP. Pregnancy  If you are premenopausal and you may become pregnant, ask your health   care provider about preconception counseling.  If you may become pregnant, take 400 to 800 micrograms (mcg) of folic acid every day.  If you want to prevent pregnancy, talk to your health care provider about birth control (contraception). Osteoporosis and menopause  Osteoporosis is a disease in which the bones lose minerals and strength with aging. This can result in serious bone fractures. Your risk for osteoporosis can be identified using a bone density scan.  If you are 65 years of age or older, or if you are at risk for osteoporosis and fractures, ask your health care provider if you should be screened.  Ask your health care provider whether you should take a calcium or vitamin D supplement to lower your risk for osteoporosis.  Menopause may have certain physical symptoms and risks.  Hormone replacement therapy may reduce some of these symptoms and risks. Talk to your health care provider about whether hormone replacement therapy is right for you. Follow these instructions at home:  Schedule regular health, dental, and eye exams.  Stay current with your immunizations.  Do not use any tobacco products including cigarettes, chewing tobacco, or electronic cigarettes.  If you are pregnant, do not drink alcohol.  If you are breastfeeding, limit how much and how often you drink alcohol.  Limit alcohol intake to no more than 1 drink per day for nonpregnant women. One drink equals 12 ounces of beer, 5 ounces of wine, or 1 ounces of hard liquor.  Do not use street  drugs.  Do not share needles.  Ask your health care provider for help if you need support or information about quitting drugs.  Tell your health care provider if you often feel depressed.  Tell your health care provider if you have ever been abused or do not feel safe at home. This information is not intended to replace advice given to you by your health care provider. Make sure you discuss any questions you have with your health care provider. Document Released: 04/23/2011 Document Revised: 03/15/2016 Document Reviewed: 07/12/2015 Elsevier Interactive Patient Education  2018 [employer] Inc.  

## 2017-07-17 NOTE — Progress Notes (Signed)
   Subjective:    Patient ID: Emily Owen, female    DOB: 11/03/1929, 83 y.o.   MRN: [id number]  HPI Here for medicare wellness and physical, no new complaints. Please see A/P for status and treatment of chronic medical problems.   Diet: DM since diabetic Physical activity: sedentary, only about to walk <100 feet due to breathing Depression/mood screen: negative Hearing: intact to whispered voice, moderate loss declines hearing exam Visual acuity: grossly normal right eye, performs annual eye exam but none in 2-3 years ADLs: capable Fall risk: none Home safety: good Cognitive evaluation: intact to orientation, naming, recall and repetition EOL planning: adv directives discussed, not in place  I have personally reviewed and have noted 1. The patient's medical and social history - reviewed today no changes 2. Their use of alcohol, tobacco or illicit drugs 3. Their current medications and supplements 4. The patient's functional ability including ADL's, fall risks, home safety risks and hearing or visual impairment. 5. Diet and physical activities 6. Evidence for depression or mood disorders 7. Care team reviewed and updated (available in snapshot)  Review of Systems  Constitutional: Negative.   HENT: Positive for hearing loss.   Eyes: Negative.   Respiratory: Positive for shortness of breath. Negative for cough and chest tightness.        Chronic, stable  Cardiovascular: Negative for chest pain, palpitations and leg swelling.  Gastrointestinal: Negative for abdominal distention, abdominal pain, constipation, diarrhea, nausea and vomiting.  Musculoskeletal: Positive for arthralgias and gait problem. Negative for back pain, myalgias and neck pain.  Skin: Negative.   Psychiatric/Behavioral: Negative.       Objective:   Physical Exam  Constitutional: She is oriented to person, place, and time. She appears well-developed and well-nourished.  Overweight  HENT:  Head:  Normocephalic and atraumatic.  Eyes: EOM are normal.  Neck: Normal range of motion.  Cardiovascular: Normal rate and regular rhythm.   Pulmonary/Chest: Effort normal and breath sounds normal. No respiratory distress. She has no wheezes. She has no rales.  On oxygen, able to speak in full sentences.   Abdominal: Soft. Bowel sounds are normal. She exhibits no distension. There is no tenderness. There is no rebound.  Musculoskeletal: She exhibits no edema.  Neurological: She is alert and oriented to person, place, and time. Coordination normal.  Skin: Skin is warm and dry.  Psychiatric: She has a normal mood and affect.   Vitals:   07/17/17 0920  BP: 140/70  Pulse: 61  Temp: 98.6 F (37 C)  TempSrc: Oral  SpO2: 100%  Weight: 179 lb 9.6 oz (81.5 kg)  Height: 5\' 4"  (1.626 m)      Assessment & Plan:

## 2017-07-19 DIAGNOSIS — Z0000 Encounter for general adult medical examination without abnormal findings: Secondary | ICD-10-CM | POA: Insufficient documentation

## 2017-07-19 NOTE — Assessment & Plan Note (Signed)
Checking HgA1c and adjust as needed. She is diet controlled at this time. Foot exam up to date. Complicated by PVD.

## 2017-07-19 NOTE — Assessment & Plan Note (Signed)
Flu shot and pneumonia shot up to date. Tetanus shot declined. Counseled about sun safety and mole surveillance. Counseled on shingrix. Given 10 year screening recommendations.

## 2017-07-19 NOTE — Assessment & Plan Note (Signed)
Lipid panel up to date, no adjustment to crestor 20 mg daily.

## 2017-07-19 NOTE — Assessment & Plan Note (Signed)
BP at goal on lasix and amlodipine. Checking CMP and adjust as needed.

## 2017-07-23 DIAGNOSIS — J449 Chronic obstructive pulmonary disease, unspecified: Secondary | ICD-10-CM | POA: Diagnosis not present

## 2017-07-25 ENCOUNTER — Other Ambulatory Visit: Payer: Self-pay | Admitting: Internal Medicine

## 2017-07-25 DIAGNOSIS — E1151 Type 2 diabetes mellitus with diabetic peripheral angiopathy without gangrene: Secondary | ICD-10-CM | POA: Diagnosis not present

## 2017-07-25 LAB — BASIC METABOLIC PANEL
BUN: 18 (ref 4–21)
CREATININE: 1 (ref 0.5–1.1)
GLUCOSE: 130
Potassium: 4.9 (ref 3.4–5.3)
Sodium: 143 (ref 137–147)

## 2017-07-25 LAB — HEPATIC FUNCTION PANEL
ALT: 21 (ref 7–35)
AST: 25 (ref 13–35)
Alkaline Phosphatase: 79 (ref 25–125)
BILIRUBIN, TOTAL: 0.3

## 2017-07-25 LAB — HEMOGLOBIN A1C: Hemoglobin A1C: 6.9

## 2017-07-26 ENCOUNTER — Encounter: Payer: Self-pay | Admitting: Internal Medicine

## 2017-07-26 LAB — COMPREHENSIVE METABOLIC PANEL
ALK PHOS: 79 IU/L (ref 39–117)
ALT: 21 IU/L (ref 0–32)
AST: 25 IU/L (ref 0–40)
Albumin/Globulin Ratio: 1.5 (ref 1.2–2.2)
Albumin: 4.2 g/dL (ref 3.5–4.7)
BUN / CREAT RATIO: 18 (ref 12–28)
BUN: 18 mg/dL (ref 8–27)
Bilirubin Total: 0.3 mg/dL (ref 0.0–1.2)
CO2: 29 mmol/L (ref 20–29)
CREATININE: 0.99 mg/dL (ref 0.57–1.00)
Calcium: 9 mg/dL (ref 8.7–10.3)
Chloride: 97 mmol/L (ref 96–106)
GFR calc non Af Amer: 52 mL/min/{1.73_m2} — ABNORMAL LOW (ref >59)
GFR, EST AFRICAN AMERICAN: 61 mL/min/{1.73_m2} (ref >59)
GLUCOSE: 130 mg/dL — AB (ref 65–99)
Globulin, Total: 2.8 g/dL (ref 1.5–4.5)
POTASSIUM: 4.9 mmol/L (ref 3.5–5.2)
Sodium: 143 mmol/L (ref 134–144)
Total Protein: 7 g/dL (ref 6.0–8.5)

## 2017-07-26 LAB — HGB A1C W/O EAG: Hgb A1c MFr Bld: 6.9 % — ABNORMAL HIGH (ref 4.8–5.6)

## 2017-07-26 LAB — AMBIG ABBREV CMP14 DEFAULT

## 2017-08-23 DIAGNOSIS — J449 Chronic obstructive pulmonary disease, unspecified: Secondary | ICD-10-CM | POA: Diagnosis not present

## 2017-08-26 DIAGNOSIS — M79674 Pain in right toe(s): Secondary | ICD-10-CM | POA: Diagnosis not present

## 2017-08-26 DIAGNOSIS — B351 Tinea unguium: Secondary | ICD-10-CM | POA: Diagnosis not present

## 2017-08-26 DIAGNOSIS — M79675 Pain in left toe(s): Secondary | ICD-10-CM | POA: Diagnosis not present

## 2017-08-26 DIAGNOSIS — I739 Peripheral vascular disease, unspecified: Secondary | ICD-10-CM | POA: Diagnosis not present

## 2017-09-22 DIAGNOSIS — J449 Chronic obstructive pulmonary disease, unspecified: Secondary | ICD-10-CM | POA: Diagnosis not present

## 2017-10-21 ENCOUNTER — Telehealth: Payer: Self-pay | Admitting: Pulmonary Disease

## 2017-10-21 NOTE — Telephone Encounter (Signed)
Spoke with Emily Owen who is emergency contact of pt who stated that pt had received something from insurance specifying that pt needs to have another breathing test done.  Dr. Lake Bells, please advise on this. Emily Owen stated that she thinks it is to make sure pt still needs to wear her O2  Please advise if you think we need to order a 44min walk for pt or if a qualifying walk will be enough.  They are wanting this to be done at [facility].

## 2017-10-21 NOTE — Telephone Encounter (Signed)
Qualifying walk

## 2017-10-23 DIAGNOSIS — J449 Chronic obstructive pulmonary disease, unspecified: Secondary | ICD-10-CM | POA: Diagnosis not present

## 2017-10-23 NOTE — Telephone Encounter (Signed)
Qualifying walks aren't done at [facility]. lmtcb x1 for pt's daughter, [name].

## 2017-10-23 NOTE — Telephone Encounter (Signed)
Called and spoke with pt's daughter, Ardelle Park letting her know that BQ had said pt to have a qualifying walk. With the qualifying walk, it could not be done at [facility] that we would have to do it at our office along with an OV for pt.  Niki expressed understanding. Scheduled pt for an appt with TP 1/16 at 12pm. Nothing further needed.

## 2017-10-23 NOTE — Telephone Encounter (Signed)
Pt daughter,Niki returning phone call. Cb [phone].

## 2017-10-29 DIAGNOSIS — Z6830 Body mass index (BMI) 30.0-30.9, adult: Secondary | ICD-10-CM | POA: Diagnosis not present

## 2017-10-29 DIAGNOSIS — R269 Unspecified abnormalities of gait and mobility: Secondary | ICD-10-CM | POA: Diagnosis not present

## 2017-10-29 DIAGNOSIS — Z8249 Family history of ischemic heart disease and other diseases of the circulatory system: Secondary | ICD-10-CM | POA: Diagnosis not present

## 2017-10-29 DIAGNOSIS — E1151 Type 2 diabetes mellitus with diabetic peripheral angiopathy without gangrene: Secondary | ICD-10-CM | POA: Diagnosis not present

## 2017-10-29 DIAGNOSIS — Z833 Family history of diabetes mellitus: Secondary | ICD-10-CM | POA: Diagnosis not present

## 2017-10-29 DIAGNOSIS — G4700 Insomnia, unspecified: Secondary | ICD-10-CM | POA: Diagnosis not present

## 2017-10-29 DIAGNOSIS — Z87891 Personal history of nicotine dependence: Secondary | ICD-10-CM | POA: Diagnosis not present

## 2017-10-29 DIAGNOSIS — Z7951 Long term (current) use of inhaled steroids: Secondary | ICD-10-CM | POA: Diagnosis not present

## 2017-10-29 DIAGNOSIS — J449 Chronic obstructive pulmonary disease, unspecified: Secondary | ICD-10-CM | POA: Diagnosis not present

## 2017-10-29 DIAGNOSIS — E669 Obesity, unspecified: Secondary | ICD-10-CM | POA: Diagnosis not present

## 2017-11-06 ENCOUNTER — Ambulatory Visit: Payer: Medicare HMO | Admitting: Adult Health

## 2017-11-06 ENCOUNTER — Encounter: Payer: Self-pay | Admitting: Adult Health

## 2017-11-06 DIAGNOSIS — J9612 Chronic respiratory failure with hypercapnia: Secondary | ICD-10-CM

## 2017-11-06 DIAGNOSIS — J449 Chronic obstructive pulmonary disease, unspecified: Secondary | ICD-10-CM | POA: Diagnosis not present

## 2017-11-06 NOTE — Patient Instructions (Signed)
Continue on ANORO And ARNUITY  daily   Continue on Oxygen 3l/m .  Follow up Dr. Lake Bells 3 months and As needed

## 2017-11-06 NOTE — Progress Notes (Signed)
@Patient  ID: Emily Owen, female    DOB: 11/22/1929, 84 y.o.   MRN: [id number]  Chief Complaint  Patient presents with  . Follow-up    COPD     Referring provider: Hoyt Koch, *  HPI: 84 year old female former smoker followed for severe COPD and oxygen dependent respiratory failure  TEST  June 2016 pulmonary function testing ratio 49%, FEV1 0.85 L (44% predicted, 9% change postbronchodilator), total lung capacity 5.33 L (103% predicted), DLCO 12.21 (48% predicted)  11/06/17 Follow up : COPD /O2 RF  Patient presents for a 23-month follow-up.  Says overall her breathing is doing okay with no flare of cough or wheezing. Remains on ARNUITY and ANORO .  Gets winded with walking prolonged time or going up an incline.  Patient is on oxygen 3 L.  Insurance and DME are requiring a oxygen qualification test today.  Walk test on room air patient drops her oxygen into the mid 80s.  On 3 L of oxygen is able to maintain an oxygenation greater than 3 L.  Says she benefits from oxygen with decreased shortness of breath.   No Known Allergies  Immunization History  Administered Date(s) Administered  . Influenza Whole 07/24/2014  . Influenza, High Dose Seasonal PF 07/17/2016, 07/16/2017  . Influenza,inj,Quad PF,6+ Mos 07/05/2015  . Pneumococcal Conjugate-13 08/24/2014  . Pneumococcal Polysaccharide-23 03/24/2005    Past Medical History:  Diagnosis Date  . Cancer ([place])    colon  . Cancer of cervix ([place])    cervix  . COPD (chronic obstructive pulmonary disease) ([place])   . Gallstones   . Gout   . Hard of hearing   . Osteoporosis   . Pleural effusion     Tobacco History: Social History   Tobacco Use  Smoking Status Former Smoker  . Packs/day: 2.00  . Years: 35.00  . Pack years: 70.00  . Types: Cigarettes  . Last attempt to quit: 08/11/1994  . Years since quitting: 23.2  Smokeless Tobacco Never Used   Counseling given: Not Answered   Outpatient Encounter  Medications as of 11/06/2017  Medication Sig  . albuterol (PROVENTIL) (2.5 MG/3ML) 0.083% nebulizer solution Take 3 mLs (2.5 mg total) by nebulization every 4 (four) hours as needed for wheezing or shortness of breath.  Marland Kitchen amLODipine (NORVASC) 5 MG tablet Take 1 tablet (5 mg total) by mouth daily.  Marland Kitchen aspirin EC 81 MG tablet Take 81 mg by mouth daily.  . Cholecalciferol (VITAMIN D) 2000 UNITS CAPS Take 2,000 Units by mouth daily.  . fish oil-omega-3 fatty acids 1000 MG capsule Take 2 g by mouth 2 (two) times daily.  . Fluticasone Furoate (ARNUITY ELLIPTA) 100 MCG/ACT AEPB Inhale 1 puff into the lungs daily.  . furosemide (LASIX) 40 MG tablet Take 1 tablet (40 mg total) by mouth daily.  Marland Kitchen glucose blood (BAYER CONTOUR NEXT TEST) test strip Use as instructed  . guaiFENesin (MUCINEX) 600 MG 12 hr tablet Take 2 tablets (1,200 mg total) by mouth 2 (two) times daily. (Patient taking differently: Take 1,200 mg by mouth 2 (two) times daily as needed. )  . Ipratropium-Albuterol (COMBIVENT RESPIMAT) 20-100 MCG/ACT AERS respimat Inhale 1 puff into the lungs every 6 (six) hours as needed for wheezing.  . Multiple Vitamins-Minerals (PRESERVISION AREDS PO) Take 1 capsule by mouth 2 (two) times daily.  . rosuvastatin (CRESTOR) 20 MG tablet Take 1 tablet (20 mg total) by mouth at bedtime.  . traZODone (DESYREL) 50 MG tablet Take 0.5-1 tablets (  25-50 mg total) by mouth at bedtime as needed for sleep.  Marland Kitchen umeclidinium-vilanterol (ANORO ELLIPTA) 62.5-25 MCG/INH AEPB Inhale 1 puff into the lungs daily.   No facility-administered encounter medications on file as of 11/06/2017.      Review of Systems  Constitutional:   No  weight loss, night sweats,  Fevers, chills, fatigue, or  lassitude.  HEENT:   No headaches,  Difficulty swallowing,  Tooth/dental problems, or  Sore throat,                No sneezing, itching, ear ache, nasal congestion, post nasal drip,   CV:  No chest pain,  Orthopnea, PND, swelling in lower  extremities, anasarca, dizziness, palpitations, syncope.   GI  No heartburn, indigestion, abdominal pain, nausea, vomiting, diarrhea, change in bowel habits, loss of appetite, bloody stools.   Resp: No shortness of breath with exertion or at rest.  No excess mucus, no productive cough,  No non-productive cough,  No coughing up of blood.  No change in color of mucus.  No wheezing.  No chest wall deformity  Skin: no rash or lesions.  GU: no dysuria, change in color of urine, no urgency or frequency.  No flank pain, no hematuria   MS:  No joint pain or swelling.  No decreased range of motion.  No back pain.    Physical Exam  BP 132/60 (BP Location: Right Arm, Cuff Size: Normal)   Pulse 61   Ht 5\' 4"  (1.626 m)   Wt 180 lb (81.6 kg)   SpO2 92%   BMI 30.90 kg/m   GEN: A/Ox3; pleasant , NAD, elderly on oxygen   HEENT:  [place]/AT,  EACs-clear, TMs-wnl, NOSE-clear, THROAT-clear, no lesions, no postnasal drip or exudate noted.   NECK:  Supple w/ fair ROM; no JVD; normal carotid impulses w/o bruits; no thyromegaly or nodules palpated; no lymphadenopathy.    RESP diminished breath sounds in the bases . no accessory muscle use, no dullness to percussion  CARD:  RRR, no m/r/g, no peripheral edema, pulses intact, no cyanosis or clubbing.  GI:   Soft & nt; nml bowel sounds; no organomegaly or masses detected.   Musco: Warm bil, no deformities or joint swelling noted.   Neuro: alert, no focal deficits noted.    Skin: Warm, no lesions or rashes    Lab Results:  CBC   BNP No results found for: BNP  Imaging: No results found.   Assessment & Plan:   No problem-specific Assessment & Plan notes found for this encounter.     Rexene Edison, NP 11/06/2017

## 2017-11-07 NOTE — Assessment & Plan Note (Signed)
Compensated on present regimen  Plan  Patient Instructions  Continue on ANORO And ARNUITY  daily   Continue on Oxygen 3l/m .  Follow up Dr. Lake Bells 3 months and As needed

## 2017-11-07 NOTE — Assessment & Plan Note (Signed)
Compensated on oxygen 

## 2017-11-23 DIAGNOSIS — J449 Chronic obstructive pulmonary disease, unspecified: Secondary | ICD-10-CM | POA: Diagnosis not present

## 2017-12-21 DIAGNOSIS — J449 Chronic obstructive pulmonary disease, unspecified: Secondary | ICD-10-CM | POA: Diagnosis not present

## 2018-01-14 ENCOUNTER — Ambulatory Visit: Payer: Medicare HMO | Admitting: Internal Medicine

## 2018-01-14 DIAGNOSIS — Z0289 Encounter for other administrative examinations: Secondary | ICD-10-CM

## 2018-01-21 DIAGNOSIS — J449 Chronic obstructive pulmonary disease, unspecified: Secondary | ICD-10-CM | POA: Diagnosis not present

## 2018-01-28 ENCOUNTER — Encounter: Payer: Self-pay | Admitting: Internal Medicine

## 2018-02-04 ENCOUNTER — Ambulatory Visit: Payer: Medicare HMO | Admitting: Pulmonary Disease

## 2018-02-13 ENCOUNTER — Encounter: Payer: Self-pay | Admitting: *Deleted

## 2018-02-13 DIAGNOSIS — K859 Acute pancreatitis without necrosis or infection, unspecified: Secondary | ICD-10-CM | POA: Insufficient documentation

## 2018-02-20 DIAGNOSIS — J449 Chronic obstructive pulmonary disease, unspecified: Secondary | ICD-10-CM | POA: Diagnosis not present

## 2018-02-25 ENCOUNTER — Encounter: Payer: Self-pay | Admitting: Family Medicine

## 2018-02-25 ENCOUNTER — Ambulatory Visit (INDEPENDENT_AMBULATORY_CARE_PROVIDER_SITE_OTHER): Payer: Medicare HMO | Admitting: Family Medicine

## 2018-02-25 VITALS — BP 100/60 | HR 58 | Temp 98.0°F | Resp 18 | Ht 64.0 in | Wt 174.0 lb

## 2018-02-25 DIAGNOSIS — Z7689 Persons encountering health services in other specified circumstances: Secondary | ICD-10-CM

## 2018-02-25 DIAGNOSIS — M79671 Pain in right foot: Secondary | ICD-10-CM

## 2018-02-25 DIAGNOSIS — M810 Age-related osteoporosis without current pathological fracture: Secondary | ICD-10-CM | POA: Diagnosis not present

## 2018-02-25 DIAGNOSIS — I7300 Raynaud's syndrome without gangrene: Secondary | ICD-10-CM | POA: Diagnosis not present

## 2018-02-25 DIAGNOSIS — J449 Chronic obstructive pulmonary disease, unspecified: Secondary | ICD-10-CM | POA: Diagnosis not present

## 2018-02-25 DIAGNOSIS — E118 Type 2 diabetes mellitus with unspecified complications: Secondary | ICD-10-CM

## 2018-02-25 DIAGNOSIS — J9612 Chronic respiratory failure with hypercapnia: Secondary | ICD-10-CM | POA: Diagnosis not present

## 2018-02-25 MED ORDER — ALENDRONATE SODIUM 70 MG PO TABS
70.0000 mg | ORAL_TABLET | ORAL | 11 refills | Status: DC
Start: 2018-02-25 — End: 2018-11-13

## 2018-02-25 NOTE — Progress Notes (Signed)
Subjective:    Patient ID: Emily Owen, female    DOB: 10/15/1930, 84 y.o.   MRN: [id number]  HPI  Patient is a very pleasant 84 year old Caucasian female here today to establish care.  Past medical history is significant mainly for severe COPD that is oxygen dependent.  She is currently seeing pulmonology and is being managed with anoro.  Patient had received Pneumovax 23 as well as Prevnar 13.  She is due for the shingles vaccine.  Regarding her preventative care, given her advanced age and medical comorbidities, the patient does not require a colonoscopy any further or mammogram.  She has a history of a hysterectomy for cervical cancer back in the 2s.  However given her age, I would not recommend a Pap smear.  She had a bone density test performed in 2015 in [place].  At that time, her T score was -2.8 or worse in both hips.  Therefore she has osteoporosis and is not currently taking calcium, vitamin D, or any bisphosphonate.  She is not certain why.  On exam today, her right second toe is purple/erythematous.  Distal to the MTP joint it is swollen.  It has all delayed capillary refill of less than 5 seconds.  It is somewhat cool to the touch.  Although this has been concerned about arterial insufficiency.  She states that his been erythematous and swollen for almost 2 months raising the concern of possible osteomyelitis.  It is not tender to the touch.  It is not hot.  Patient is on amlodipine however her blood pressure is low.  She states that her previous doctor was giving her this to help improve blood flow.  After long discussion, it sounds like the patient may have Raynaud's phenomenon.  She is a longtime smoker and does report claudication and pain during cold weather.  I believe she is taking the amlodipine for this.  Past medical history is also significant for colon cancer status post hemicolectomy in the late 90s. Past Medical History:  Diagnosis Date  . Cancer ([place])    colon    . Cancer of cervix ([place])    cervix  . COPD (chronic obstructive pulmonary disease) ([place])   . Gallstones   . Gout   . Hard of hearing   . Osteoporosis   . Pleural effusion   . Raynaud's disease    Past Surgical History:  Procedure Laterality Date  . ABDOMINAL HYSTERECTOMY     for cervical cancer 1965  . COLON SURGERY     hemicolectomy 1998  . gallstone surgery     ERCP for gallstone pancreatitis  . ORTHOPEDIC SURGERY     Current Outpatient Medications on File Prior to Visit  Medication Sig Dispense Refill  . amLODipine (NORVASC) 5 MG tablet Take 1 tablet (5 mg total) by mouth daily. 90 tablet 3  . OXYGEN Inhale 3 L into the lungs continuous. [facility]    . rosuvastatin (CRESTOR) 20 MG tablet Take 1 tablet (20 mg total) by mouth at bedtime. 90 tablet 3  . umeclidinium-vilanterol (ANORO ELLIPTA) 62.5-25 MCG/INH AEPB Inhale 1 puff into the lungs daily. 60 each 5   No current facility-administered medications on file prior to visit.    No Known Allergies Social History   Socioeconomic History  . Marital status: Widowed    Spouse name: Not on file  . Number of children: Not on file  . Years of education: Not on file  . Highest education level: Not on file  Occupational History  . Not on file  Social Needs  . Financial resource strain: Not on file  . Food insecurity:    Worry: Not on file    Inability: Not on file  . Transportation needs:    Medical: Not on file    Non-medical: Not on file  Tobacco Use  . Smoking status: Former Smoker    Packs/day: 2.00    Years: 35.00    Pack years: 70.00    Types: Cigarettes    Last attempt to quit: 08/11/1994    Years since quitting: 23.5  . Smokeless tobacco: Never Used  Substance and Sexual Activity  . Alcohol use: No    Alcohol/week: 0.0 oz  . Drug use: No  . Sexual activity: Not on file  Lifestyle  . Physical activity:    Days per week: Not on file    Minutes per session: Not on file  . Stress: Not on file   Relationships  . Social connections:    Talks on phone: Not on file    Gets together: Not on file    Attends religious service: Not on file    Active member of club or organization: Not on file    Attends meetings of clubs or organizations: Not on file    Relationship status: Not on file  . Intimate partner violence:    Fear of current or ex partner: Not on file    Emotionally abused: Not on file    Physically abused: Not on file    Forced sexual activity: Not on file  Other Topics Concern  . Not on file  Social History Narrative  . Not on file     Review of Systems  All other systems reviewed and are negative.      Objective:   Physical Exam  Constitutional: She appears well-developed and well-nourished. No distress.  Neck: No JVD present.  Cardiovascular: Normal rate, regular rhythm, normal heart sounds and intact distal pulses. Exam reveals no gallop and no friction rub.  No murmur heard. Pulmonary/Chest: Effort normal. She has wheezes. She has no rales.  Abdominal: Soft. Bowel sounds are normal. She exhibits no distension.  Skin: She is not diaphoretic.  Vitals reviewed.         Assessment & Plan:  Right foot pain - Plan: DG Foot Complete Right  Encounter to establish care with new doctor  COPD, severe ([place])  Chronic respiratory failure with hypercapnia ([place])  Raynaud's disease without gangrene  Osteoporosis, unspecified osteoporosis type, unspecified pathological fracture presence  Controlled type 2 diabetes mellitus with complication, without long-term current use of insulin ([place]) - Plan: CBC with Differential/Platelet, COMPLETE METABOLIC PANEL WITH GFR, Lipid panel, Microalbumin, urine, Hemoglobin A1c  First I am concerned about the swollen erythematous right second toe.  Differential diagnosis includes chronic osteomyelitis, vascular insufficiency/Raynaud's phenomenon.  I recommended obtaining an x-ray of the foot to rule out evidence of  osteomyelitis.  If there is no evidence of osteomyelitis, I will obtain arterial Doppler/ABIs with TBI's to evaluate for arterial insufficiency.  If there is significant peripheral vascular disease found, I would recommend consultation with vascular surgery although the patient will be a poor surgical candidate.  Regarding her COPD, her immunizations are up-to-date.  She is currently on oxygen therapy with dual long-acting bronchodilator therapy.  I will continue amlodipine despite her low blood pressure due to her history of her nodes phenomenon.  I am concerned about her history of osteoporosis.  She is on  no therapy.  Therefore I recommended calcium 1200 mg a day, vitamin D 1000 units a day, and start Fosamax 70 mg weekly.  Repeat bone density test in 2 years.  I have recommended the shingles vaccine.  I would like the patient to return fasting so that I can check a CBC, CMP, fasting lipid panel, urine microalbumin, and hemoglobin A1c.  Goal hemoglobin A1c is less than 7.  Goal LDL cholesterol is less than 70 given what I suspect is peripheral vascular disease.  Patient also complains of frequent diarrhea and nocturia.  Await the results the lab work.  If labs are relatively normal, we may try treating her chronic diarrhea with Imodium versus cholestyramine.

## 2018-02-26 ENCOUNTER — Ambulatory Visit (HOSPITAL_COMMUNITY)
Admission: RE | Admit: 2018-02-26 | Discharge: 2018-02-26 | Disposition: A | Discharge status: Home / Self Care | Payer: Medicare HMO | Source: Ambulatory Visit | Attending: Family Medicine | Admitting: Family Medicine

## 2018-02-26 ENCOUNTER — Other Ambulatory Visit: Payer: Medicare HMO

## 2018-02-26 DIAGNOSIS — M85871 Other specified disorders of bone density and structure, right ankle and foot: Secondary | ICD-10-CM | POA: Insufficient documentation

## 2018-02-26 DIAGNOSIS — M79671 Pain in right foot: Secondary | ICD-10-CM | POA: Insufficient documentation

## 2018-02-26 DIAGNOSIS — M7989 Other specified soft tissue disorders: Secondary | ICD-10-CM | POA: Diagnosis not present

## 2018-02-26 DIAGNOSIS — M7731 Calcaneal spur, right foot: Secondary | ICD-10-CM | POA: Diagnosis not present

## 2018-02-26 DIAGNOSIS — E118 Type 2 diabetes mellitus with unspecified complications: Secondary | ICD-10-CM | POA: Diagnosis not present

## 2018-02-27 ENCOUNTER — Encounter: Payer: Self-pay | Admitting: Family Medicine

## 2018-02-27 ENCOUNTER — Other Ambulatory Visit: Payer: Self-pay | Admitting: Family Medicine

## 2018-02-27 DIAGNOSIS — R0989 Other specified symptoms and signs involving the circulatory and respiratory systems: Secondary | ICD-10-CM

## 2018-02-27 LAB — COMPLETE METABOLIC PANEL WITH GFR
AG RATIO: 1.4 (calc) (ref 1.0–2.5)
ALKALINE PHOSPHATASE (APISO): 75 U/L (ref 33–130)
ALT: 14 U/L (ref 6–29)
AST: 20 U/L (ref 10–35)
Albumin: 4 g/dL (ref 3.6–5.1)
BILIRUBIN TOTAL: 0.5 mg/dL (ref 0.2–1.2)
BUN/Creatinine Ratio: 20 (calc) (ref 6–22)
BUN: 22 mg/dL (ref 7–25)
CALCIUM: 9.5 mg/dL (ref 8.6–10.4)
CO2: 32 mmol/L (ref 20–32)
Chloride: 102 mmol/L (ref 98–110)
Creat: 1.09 mg/dL — ABNORMAL HIGH (ref 0.60–0.88)
GFR, Est African American: 54 mL/min/{1.73_m2} — ABNORMAL LOW (ref >=60)
GFR, Est Non African American: 46 mL/min/{1.73_m2} — ABNORMAL LOW (ref >=60)
GLOBULIN: 2.8 g/dL (ref 1.9–3.7)
Glucose, Bld: 138 mg/dL — ABNORMAL HIGH (ref 65–99)
POTASSIUM: 5.3 mmol/L (ref 3.5–5.3)
SODIUM: 149 mmol/L — AB (ref 135–146)
Total Protein: 6.8 g/dL (ref 6.1–8.1)

## 2018-02-27 LAB — CBC WITH DIFFERENTIAL/PLATELET
Basophils Absolute: 39 cells/uL (ref 0–200)
Basophils Relative: 0.7 %
Eosinophils Absolute: 202 cells/uL (ref 15–500)
Eosinophils Relative: 3.6 %
HEMATOCRIT: 38.3 % (ref 35.0–45.0)
Hemoglobin: 12 g/dL (ref 11.7–15.5)
Lymphs Abs: 1254 cells/uL (ref 850–3900)
MCH: 27.3 pg (ref 27.0–33.0)
MCHC: 31.3 g/dL — ABNORMAL LOW (ref 32.0–36.0)
MCV: 87.2 fL (ref 80.0–100.0)
MPV: 8.8 fL (ref 7.5–12.5)
Monocytes Relative: 8.1 %
Neutro Abs: 3651 cells/uL (ref 1500–7800)
Neutrophils Relative %: 65.2 %
PLATELETS: 186 10*3/uL (ref 140–400)
RBC: 4.39 10*6/uL (ref 3.80–5.10)
RDW: 13.7 % (ref 11.0–15.0)
TOTAL LYMPHOCYTE: 22.4 %
WBC: 5.6 10*3/uL (ref 3.8–10.8)
WBCMIX: 454 {cells}/uL (ref 200–950)

## 2018-02-27 LAB — HEMOGLOBIN A1C
EAG (MMOL/L): 7.4 (calc)
Hgb A1c MFr Bld: 6.3 % of total Hgb — ABNORMAL HIGH (ref <5.7)
Mean Plasma Glucose: 134 (calc)

## 2018-02-27 LAB — MICROALBUMIN, URINE: MICROALB UR: 6.8 mg/dL

## 2018-02-27 LAB — LIPID PANEL
Cholesterol: 216 mg/dL — ABNORMAL HIGH (ref <200)
HDL: 34 mg/dL — AB (ref >50)
LDL Cholesterol (Calc): 132 mg/dL (calc) — ABNORMAL HIGH
NON-HDL CHOLESTEROL (CALC): 182 mg/dL — AB (ref <130)
TRIGLYCERIDES: 348 mg/dL — AB (ref <150)
Total CHOL/HDL Ratio: 6.4 (calc) — ABNORMAL HIGH (ref <5.0)

## 2018-02-27 MED ORDER — SOLIFENACIN SUCCINATE 10 MG PO TABS: 10.0000 mg | ORAL_TABLET | Freq: Every day | ORAL | 3 refills | Status: DC

## 2018-02-27 MED ORDER — ROSUVASTATIN CALCIUM 40 MG PO TABS: 40.0000 mg | ORAL_TABLET | Freq: Every day | ORAL | 1 refills | Status: DC

## 2018-02-27 MED ORDER — ROSUVASTATIN CALCIUM 20 MG PO TABS: 20.0000 mg | ORAL_TABLET | Freq: Every day | ORAL | 3 refills | Status: DC

## 2018-02-27 MED ORDER — ZOSTER VAC RECOMB ADJUVANTED 50 MCG/0.5ML IM SUSR: 0.5000 mL | Freq: Once | INTRAMUSCULAR | 1 refills | Status: AC

## 2018-03-05 MED ORDER — AMLODIPINE BESYLATE 5 MG PO TABS: 5.0000 mg | ORAL_TABLET | Freq: Every day | ORAL | 3 refills | Status: DC

## 2018-03-05 MED ORDER — ZOSTER VAC RECOMB ADJUVANTED 50 MCG/0.5ML IM SUSR: 0.5000 mL | Freq: Once | INTRAMUSCULAR | 1 refills | Status: AC

## 2018-03-05 NOTE — Addendum Note (Signed)
Addended by: Shary Decamp B on: 03/05/2018 11:29 AM   Modules accepted: Orders

## 2018-03-11 ENCOUNTER — Other Ambulatory Visit: Payer: Self-pay

## 2018-03-11 ENCOUNTER — Emergency Department (HOSPITAL_BASED_OUTPATIENT_CLINIC_OR_DEPARTMENT_OTHER)
Admission: EM | Admit: 2018-03-11 | Discharge: 2018-03-11 | Disposition: A | Discharge status: Home / Self Care | Payer: Medicare HMO | Attending: Emergency Medicine | Admitting: Emergency Medicine

## 2018-03-11 DIAGNOSIS — J449 Chronic obstructive pulmonary disease, unspecified: Secondary | ICD-10-CM | POA: Diagnosis not present

## 2018-03-11 DIAGNOSIS — Z87891 Personal history of nicotine dependence: Secondary | ICD-10-CM | POA: Diagnosis not present

## 2018-03-11 DIAGNOSIS — Z85038 Personal history of other malignant neoplasm of large intestine: Secondary | ICD-10-CM | POA: Diagnosis not present

## 2018-03-11 DIAGNOSIS — Z8541 Personal history of malignant neoplasm of cervix uteri: Secondary | ICD-10-CM | POA: Diagnosis not present

## 2018-03-11 DIAGNOSIS — M79671 Pain in right foot: Secondary | ICD-10-CM | POA: Diagnosis not present

## 2018-03-11 DIAGNOSIS — I10 Essential (primary) hypertension: Secondary | ICD-10-CM | POA: Insufficient documentation

## 2018-03-11 DIAGNOSIS — I2510 Atherosclerotic heart disease of native coronary artery without angina pectoris: Secondary | ICD-10-CM | POA: Insufficient documentation

## 2018-03-11 DIAGNOSIS — E119 Type 2 diabetes mellitus without complications: Secondary | ICD-10-CM | POA: Diagnosis not present

## 2018-03-11 DIAGNOSIS — Z79899 Other long term (current) drug therapy: Secondary | ICD-10-CM | POA: Diagnosis not present

## 2018-03-11 DIAGNOSIS — M10071 Idiopathic gout, right ankle and foot: Secondary | ICD-10-CM | POA: Insufficient documentation

## 2018-03-11 DIAGNOSIS — M109 Gout, unspecified: Secondary | ICD-10-CM

## 2018-03-11 LAB — CBC WITH DIFFERENTIAL/PLATELET
Basophils Absolute: 0 10*3/uL (ref 0.0–0.1)
Basophils Relative: 0 %
Eosinophils Absolute: 0.1 10*3/uL (ref 0.0–0.7)
Eosinophils Relative: 1 %
HEMATOCRIT: 36.2 % (ref 36.0–46.0)
Hemoglobin: 11.7 g/dL — ABNORMAL LOW (ref 12.0–15.0)
LYMPHS ABS: 1.2 10*3/uL (ref 0.7–4.0)
LYMPHS PCT: 13 %
MCH: 28.5 pg (ref 26.0–34.0)
MCHC: 32.3 g/dL (ref 30.0–36.0)
MCV: 88.3 fL (ref 78.0–100.0)
MONO ABS: 1.1 10*3/uL — AB (ref 0.1–1.0)
MONOS PCT: 12 %
NEUTROS ABS: 6.7 10*3/uL (ref 1.7–7.7)
Neutrophils Relative %: 74 %
Platelets: 217 10*3/uL (ref 150–400)
RBC: 4.1 MIL/uL (ref 3.87–5.11)
RDW: 13.7 % (ref 11.5–15.5)
WBC: 9.1 10*3/uL (ref 4.0–10.5)

## 2018-03-11 LAB — BASIC METABOLIC PANEL
Anion gap: 13 (ref 5–15)
BUN: 20 mg/dL (ref 6–20)
CHLORIDE: 94 mmol/L — AB (ref 101–111)
CO2: 28 mmol/L (ref 22–32)
Calcium: 8.2 mg/dL — ABNORMAL LOW (ref 8.9–10.3)
Creatinine, Ser: 1.06 mg/dL — ABNORMAL HIGH (ref 0.44–1.00)
GFR calc Af Amer: 54 mL/min — ABNORMAL LOW (ref >60)
GFR calc non Af Amer: 47 mL/min — ABNORMAL LOW (ref >60)
GLUCOSE: 147 mg/dL — AB (ref 65–99)
Potassium: 4.8 mmol/L (ref 3.5–5.1)
Sodium: 135 mmol/L (ref 135–145)

## 2018-03-11 MED ORDER — COLCHICINE 0.6 MG PO CAPS: ORAL_CAPSULE | ORAL | 1 refills | Status: DC

## 2018-03-11 NOTE — Discharge Instructions (Addendum)
Contact a health care provider if: °You have another gout attack. °You continue to have symptoms of a gout attack after10 days of treatment. °You have side effects from your medicines. °You have chills or a fever. °You have burning pain when you urinate. °You have pain in your lower back or belly. °Get help right away if: °You have severe or uncontrolled pain. °You cannot urinate. °

## 2018-03-11 NOTE — ED Notes (Signed)
Discharge instructions reviewed - voiced understanding 

## 2018-03-11 NOTE — ED Triage Notes (Signed)
Pt c/o swelling to RLE with pain and swelling and pain to lt knee

## 2018-03-12 NOTE — ED Provider Notes (Signed)
Emily Owen Provider Note   CSN: [id number] Arrival date & time: 03/11/18  1451     History   Chief Complaint Chief Complaint  Patient presents with  . Joint Swelling    foot, leg, knee    HPI Emily Owen is a 84 y.o. female     Emily Owen is an 84 year old female who presents the emergency Owen with chief complaint of right foot pain.  She has a history of gout.  Patient had redness and swelling of the toe about a month ago.  She was seen at her primary care physician's about a week ago and had a foot x-ray that was negative.  4 days ago she began having redness heat and swelling in the foot.  She states she can barely place weight on it.  She has a history of recurrent gout flares in multiple joints including her knees toes feet and ankles.  She denies fevers, chills, shortness of breath, injury to the foot.  She is having significant difficulty bearing weight on the foot.      Past Medical History:  Diagnosis Date  . Cancer ([place])    colon  . Cancer of cervix ([place])    cervix  . COPD (chronic obstructive pulmonary disease) ([place])   . Diabetes mellitus without complication ([place])   . Gallstones   . Gout   . Hard of hearing   . Osteoporosis   . Pleural effusion   . Raynaud's disease     Patient Active Problem List   Diagnosis Date Noted  . Raynaud's disease   . Pancreatitis 02/13/2018  . Routine general medical examination at a health care facility 07/19/2017  . Gout 03/14/2016  . Atherosclerosis of aorta ([place]) 06/11/2015  . Diabetes mellitus with peripheral vascular disease ([place])   . Chronic respiratory failure with hypercapnia ([place]) 06/02/2015  . COPD, severe ([place]) 03/25/2015  . Pleural effusion on right 03/25/2015  . HTN (hypertension) 08/11/2014  . Hyperlipidemia 08/20/2012    Past Surgical History:  Procedure Laterality Date  . ABDOMINAL HYSTERECTOMY     for cervical cancer 1965  . COLON SURGERY     hemicolectomy 1998  . gallstone surgery     ERCP for gallstone pancreatitis  . ORTHOPEDIC SURGERY       OB History    Gravida  6   Para  6   Term  6   Preterm      AB      Living  5     SAB      TAB      Ectopic      Multiple      Live Births               Home Medications    Prior to Admission medications   Medication Sig Start Date End Date Taking? Authorizing Provider  alendronate (FOSAMAX) 70 MG tablet Take 1 tablet (70 mg total) by mouth every 7 (seven) days. Take with a full glass of water on an empty stomach. 02/25/18   [name], MD  amLODipine (NORVASC) 5 MG tablet Take 1 tablet (5 mg total) by mouth daily. 03/05/18   [name], MD  Colchicine 0.6 MG CAPS 1.2 mg orally at the first sign of a flare followed by 0.6 mg one hour later; MAX 1.8 mg over 1 hour  Then 0.6 BID as needed for gout pain 03/11/18   Margarita Mail, PA-C  OXYGEN  Inhale 3 L into the lungs continuous. [facility]    [provider]  rosuvastatin (CRESTOR) 20 MG tablet Take 1 tablet (20 mg total) by mouth daily. 02/27/18   [name], MD  solifenacin (VESICARE) 10 MG tablet Take 1 tablet (10 mg total) by mouth daily. 02/27/18   [name], MD  umeclidinium-vilanterol (ANORO ELLIPTA) 62.5-25 MCG/INH AEPB Inhale 1 puff into the lungs daily. 03/21/17   Juanito Doom, MD    Family History Family History  Problem Relation Age of Onset  . Diabetes Sister   . Diabetes Sister   . Diabetes Sister   . Hypertension Mother   . Heart disease Mother   . Diabetes Sister     Social History Social History   Tobacco Use  . Smoking status: Former Smoker    Packs/day: 2.00    Years: 35.00    Pack years: 70.00    Types: Cigarettes    Last attempt to quit: 08/11/1994    Years since quitting: 23.6  . Smokeless tobacco: Never Used  Substance Use Topics  . Alcohol use: No    Alcohol/week: 0.0 oz  . Drug use: No     Allergies   Patient has no known  allergies.   Review of Systems Review of Systems  Ten systems reviewed and are negative for acute change, except as noted in the HPI.   Physical Exam Updated Vital Signs BP (!) 126/52 (BP Location: Right Arm)   Pulse 83   Temp 98.5 F (36.9 C) (Oral)   Resp 18   SpO2 94%   Physical Exam  Physical Exam  Nursing note and vitals reviewed. Constitutional: She is oriented to person, place, and time. She appears well-developed and well-nourished. No distress.  HENT:  Head: Normocephalic and atraumatic.  Eyes: Conjunctivae normal and EOM are normal. Pupils are equal, round, and reactive to light. No scleral icterus.  Neck: Normal range of motion.  Cardiovascular: Normal rate, regular rhythm and normal heart sounds.  Exam reveals no gallop and no friction rub.   No murmur heard. Pulmonary/Chest: Effort normal and breath sounds normal. No respiratory distress.  Abdominal: Soft. Bowel sounds are normal. She exhibits no distension and no mass. There is no tenderness. There is no guarding.  Musculoskeletal: Bilateral DP and PT pulses 2+.  Right foot with heat redness and swelling over the MTP joints, and medial malleolus.  Tender to palpation.  Patient able to range the joints.  No streaking or signs of lymphangitis Neurological: She is alert and oriented to person, place, and time.  Skin: Skin is warm and dry. She is not diaphoretic.    ED Treatments / Results  Labs (all labs ordered are listed, but only abnormal results are displayed) Labs Reviewed  BASIC METABOLIC PANEL - Abnormal; Notable for the following components:      Result Value   Chloride 94 (*)    Glucose, Bld 147 (*)    Creatinine, Ser 1.06 (*)    Calcium 8.2 (*)    GFR calc non Af Amer 47 (*)    GFR calc Af Amer 54 (*)    All other components within normal limits  CBC WITH DIFFERENTIAL/PLATELET - Abnormal; Notable for the following components:   Hemoglobin 11.7 (*)    Monocytes Absolute 1.1 (*)    All other  components within normal limits    EKG None  Radiology No results found.  Procedures Procedures (including critical care time)  Medications Ordered in  ED Medications - No data to display   Initial Impression / Assessment and Plan / ED Course  I have reviewed the triage vital signs and the nursing notes.  Pertinent labs & imaging results that were available during my care of the patient were reviewed by me and considered in my medical decision making (see chart for details).  Clinical Course as of Mar 13 19  Tue Mar 12, 8175  [birth year]25 84 year old female with history of gout here with right great toe and right ankle pain and left great toe pain.  Started fairly recently and causing her a lot of difficulty ambulating.  On exam there warm red painful to touch.  I do not feel any real crepitus in the skin.  This is likely some sort of arthritis flare although there could be a cellulitis there is no fever no elevated white count.  I think it would be reasonable to get her back on the colchicine which is worked for in the past and give her a day to see if she would improve.   [MB]    Clinical Course User Index [MB] Hayden Rasmussen, MD    84 year old female with history of gout.  She has no white blood cell elevation. She is not febrile.  This appears to be a gout flare.  We will begin her on colchicine and have her followed up with her PCP in the next day or 2.  Have discussed return precautions with the patient and her daughter who appears appropriate for discharge at this time  Final Clinical Impressions(s) / ED Diagnoses   Final diagnoses:  Acute gout of right foot, unspecified cause    ED Discharge Orders        Ordered    Colchicine 0.6 MG CAPS     03/11/18 1921       Margarita Mail, PA-C 03/12/18 0021    Hayden Rasmussen, MD 03/12/18 1014

## 2018-03-23 DIAGNOSIS — J449 Chronic obstructive pulmonary disease, unspecified: Secondary | ICD-10-CM | POA: Diagnosis not present

## 2018-04-21 DIAGNOSIS — L851 Acquired keratosis [keratoderma] palmaris et plantaris: Secondary | ICD-10-CM | POA: Diagnosis not present

## 2018-04-21 DIAGNOSIS — I739 Peripheral vascular disease, unspecified: Secondary | ICD-10-CM | POA: Diagnosis not present

## 2018-04-21 DIAGNOSIS — M79674 Pain in right toe(s): Secondary | ICD-10-CM | POA: Diagnosis not present

## 2018-04-21 DIAGNOSIS — M79675 Pain in left toe(s): Secondary | ICD-10-CM | POA: Diagnosis not present

## 2018-04-21 DIAGNOSIS — B351 Tinea unguium: Secondary | ICD-10-CM | POA: Diagnosis not present

## 2018-04-22 DIAGNOSIS — J449 Chronic obstructive pulmonary disease, unspecified: Secondary | ICD-10-CM | POA: Diagnosis not present

## 2018-05-02 ENCOUNTER — Encounter: Payer: Self-pay | Admitting: Pulmonary Disease

## 2018-05-02 ENCOUNTER — Other Ambulatory Visit: Payer: Self-pay | Admitting: Pulmonary Disease

## 2018-05-02 MED ORDER — UMECLIDINIUM-VILANTEROL 62.5-25 MCG/INH IN AEPB: 1.0000 | INHALATION_SPRAY | Freq: Every day | RESPIRATORY_TRACT | 5 refills | Status: DC

## 2018-05-23 DIAGNOSIS — J449 Chronic obstructive pulmonary disease, unspecified: Secondary | ICD-10-CM | POA: Diagnosis not present

## 2018-05-30 ENCOUNTER — Ambulatory Visit (INDEPENDENT_AMBULATORY_CARE_PROVIDER_SITE_OTHER): Payer: Medicare HMO | Admitting: Family Medicine

## 2018-05-30 ENCOUNTER — Encounter: Payer: Self-pay | Admitting: Family Medicine

## 2018-05-30 VITALS — BP 100/56 | HR 66 | Temp 97.6°F | Resp 18 | Ht 64.0 in | Wt 164.0 lb

## 2018-05-30 DIAGNOSIS — K529 Noninfective gastroenteritis and colitis, unspecified: Secondary | ICD-10-CM | POA: Diagnosis not present

## 2018-05-30 MED ORDER — DIPHENOXYLATE-ATROPINE 2.5-0.025 MG PO TABS: 2.0000 | ORAL_TABLET | Freq: Four times a day (QID) | ORAL | 0 refills | Status: DC | PRN

## 2018-05-30 NOTE — Progress Notes (Signed)
Subjective:    Patient ID: Emily Owen, female    DOB: [birth year], 84 y.o.   MRN: [id number]  HPI 02/25/18 Patient is a very pleasant 84 year old Caucasian female here today to establish care.  Past medical history is significant mainly for severe COPD that is oxygen dependent.  She is currently seeing pulmonology and is being managed with anoro.  Patient had received Pneumovax 23 as well as Prevnar 13.  She is due for the shingles vaccine.  Regarding her preventative care, given her advanced age and medical comorbidities, the patient does not require a colonoscopy any further or mammogram.  She has a history of a hysterectomy for cervical cancer back in the 4s.  However given her age, I would not recommend a Pap smear.  She had a bone density test performed in 2015 in [place].  At that time, her T score was -2.8 or worse in both hips.  Therefore she has osteoporosis and is not currently taking calcium, vitamin D, or any bisphosphonate.  She is not certain why.  On exam today, her right second toe is purple/erythematous.  Distal to the MTP joint it is swollen.  It has all delayed capillary refill of less than 5 seconds.  It is somewhat cool to the touch.  Although this has been concerned about arterial insufficiency.  She states that his been erythematous and swollen for almost 2 months raising the concern of possible osteomyelitis.  It is not tender to the touch.  It is not hot.  Patient is on amlodipine however her blood pressure is low.  She states that her previous doctor was giving her this to help improve blood flow.  After long discussion, it sounds like the patient may have Raynaud's phenomenon.  She is a longtime smoker and does report claudication and pain during cold weather.  I believe she is taking the amlodipine for this.  Past medical history is also significant for colon cancer status post hemicolectomy in the late 90s.  At that time, my plan was: First I am concerned about the  swollen erythematous right second toe.  Differential diagnosis includes chronic osteomyelitis, vascular insufficiency/Raynaud's phenomenon.  I recommended obtaining an x-ray of the foot to rule out evidence of osteomyelitis.  If there is no evidence of osteomyelitis, I will obtain arterial Doppler/ABIs with TBI's to evaluate for arterial insufficiency.  If there is significant peripheral vascular disease found, I would recommend consultation with vascular surgery although the patient will be a poor surgical candidate.  Regarding her COPD, her immunizations are up-to-date.  She is currently on oxygen therapy with dual long-acting bronchodilator therapy.  I will continue amlodipine despite her low blood pressure due to her history of her nodes phenomenon.  I am concerned about her history of osteoporosis.  She is on no therapy.  Therefore I recommended calcium 1200 mg a day, vitamin D 1000 units a day, and start Fosamax 70 mg weekly.  Repeat bone density test in 2 years.  I have recommended the shingles vaccine.  I would like the patient to return fasting so that I can check a CBC, CMP, fasting lipid panel, urine microalbumin, and hemoglobin A1c.  Goal hemoglobin A1c is less than 7.  Goal LDL cholesterol is less than 70 given what I suspect is peripheral vascular disease.  Patient also complains of frequent diarrhea and nocturia.  Await the results the lab work.  If labs are relatively normal, we may try treating her chronic diarrhea with Imodium  versus cholestyramine.  05/30/18 Patient is here today with acute worsening of her chronic diarrhea.  She states that she has had chronic diarrhea ever since she had her hemicolectomy performed for colon cancer in 1998.  She has had loose stools ever since that time.  However over the last week, her diarrhea has intensified.  She reports 4-5 episodes of watery diarrhea every day and she reports occasional fecal incontinence.  She denies any abdominal pain.  She denies any  fevers or chills.  She denies any nausea or vomiting.  She denies any recent antibiotic use.  She has not been exposed to C. difficile to her knowledge.  She has not been in the hospital or nursing home.  She has not traveled outside the country.  She denies any melena or hematochezia.  Imodium is not helping. Past Medical History:  Diagnosis Date  . Cancer ([place])    colon  . Cancer of cervix ([place])    cervix  . COPD (chronic obstructive pulmonary disease) ([place])   . Diabetes mellitus without complication ([place])   . Gallstones   . Gout   . Hard of hearing   . Osteoporosis   . Pleural effusion   . Raynaud's disease    Past Surgical History:  Procedure Laterality Date  . ABDOMINAL HYSTERECTOMY     for cervical cancer 1965  . COLON SURGERY     hemicolectomy 1998  . gallstone surgery     ERCP for gallstone pancreatitis  . ORTHOPEDIC SURGERY     Current Outpatient Medications on File Prior to Visit  Medication Sig Dispense Refill  . alendronate (FOSAMAX) 70 MG tablet Take 1 tablet (70 mg total) by mouth every 7 (seven) days. Take with a full glass of water on an empty stomach. 4 tablet 11  . amLODipine (NORVASC) 5 MG tablet Take 1 tablet (5 mg total) by mouth daily. 90 tablet 3  . Colchicine 0.6 MG CAPS 1.2 mg orally at the first sign of a flare followed by 0.6 mg one hour later; MAX 1.8 mg over 1 hour  Then 0.6 BID as needed for gout pain 30 capsule 1  . OXYGEN Inhale 3 L into the lungs continuous. [facility]    . rosuvastatin (CRESTOR) 20 MG tablet Take 1 tablet (20 mg total) by mouth daily. 90 tablet 3  . solifenacin (VESICARE) 10 MG tablet Take 1 tablet (10 mg total) by mouth daily. 90 tablet 3  . umeclidinium-vilanterol (ANORO ELLIPTA) 62.5-25 MCG/INH AEPB Inhale 1 puff into the lungs daily. 60 each 5   No current facility-administered medications on file prior to visit.    No Known Allergies Social History   Socioeconomic History  . Marital status: Widowed    Spouse name: Not on  file  . Number of children: Not on file  . Years of education: Not on file  . Highest education level: Not on file  Occupational History  . Not on file  Social Needs  . Financial resource strain: Not on file  . Food insecurity:    Worry: Not on file    Inability: Not on file  . Transportation needs:    Medical: Not on file    Non-medical: Not on file  Tobacco Use  . Smoking status: Former Smoker    Packs/day: 2.00    Years: 35.00    Pack years: 70.00    Types: Cigarettes    Last attempt to quit: 08/11/1994    Years since quitting: 23.8  .  Smokeless tobacco: Never Used  Substance and Sexual Activity  . Alcohol use: No    Alcohol/week: 0.0 standard drinks  . Drug use: No  . Sexual activity: Not on file  Lifestyle  . Physical activity:    Days per week: Not on file    Minutes per session: Not on file  . Stress: Not on file  Relationships  . Social connections:    Talks on phone: Not on file    Gets together: Not on file    Attends religious service: Not on file    Active member of club or organization: Not on file    Attends meetings of clubs or organizations: Not on file    Relationship status: Not on file  . Intimate partner violence:    Fear of current or ex partner: Not on file    Emotionally abused: Not on file    Physically abused: Not on file    Forced sexual activity: Not on file  Other Topics Concern  . Not on file  Social History Narrative  . Not on file     Review of Systems  All other systems reviewed and are negative.      Objective:   Physical Exam  Constitutional: She appears well-developed and well-nourished. No distress.  Neck: No JVD present.  Cardiovascular: Normal rate, regular rhythm, normal heart sounds and intact distal pulses. Exam reveals no gallop and no friction rub.  No murmur heard. Pulmonary/Chest: Effort normal. She has wheezes. She has no rales.  Abdominal: Soft. Bowel sounds are normal. She exhibits no distension.  Skin:  She is not diaphoretic.  Vitals reviewed.         Assessment & Plan:  Chronic diarrhea.  Differential diagnosis includes inflammatory bowel disease, infectious diarrhea, or functional diarrhea status post hemicolectomy/IBS.  We discussed a GI referral for a colonoscopy to rule out inflammatory bowel disease.  However given her age, the family and the patient elect to try symptomatic therapy first.  We will try Lomotil 1 pill p.o. twice daily to see if the diarrhea will slow/improve.  If not, recommend GI consultation for colonoscopy to rule out inflammatory bowel disease.  If the patient develops symptoms of an infection, hematochezia, fever, abdominal pain, discontinue Lomotil immediately and seek medical attention.  Could also consider cholestyramine to help with diarrhea as well

## 2018-06-02 ENCOUNTER — Other Ambulatory Visit: Payer: Self-pay | Admitting: Family Medicine

## 2018-06-02 DIAGNOSIS — R0989 Other specified symptoms and signs involving the circulatory and respiratory systems: Secondary | ICD-10-CM

## 2018-06-05 ENCOUNTER — Ambulatory Visit (HOSPITAL_COMMUNITY): Admission: RE | Admit: 2018-06-05 | Payer: Medicare HMO | Source: Ambulatory Visit

## 2018-06-23 DIAGNOSIS — J449 Chronic obstructive pulmonary disease, unspecified: Secondary | ICD-10-CM | POA: Diagnosis not present

## 2018-07-11 ENCOUNTER — Ambulatory Visit (HOSPITAL_COMMUNITY): Payer: Medicare HMO

## 2018-07-15 ENCOUNTER — Ambulatory Visit (HOSPITAL_COMMUNITY)
Admission: RE | Admit: 2018-07-15 | Discharge: 2018-07-15 | Disposition: A | Discharge status: Home / Self Care | Payer: Medicare HMO | Source: Ambulatory Visit | Attending: Family Medicine | Admitting: Family Medicine

## 2018-07-15 DIAGNOSIS — R0989 Other specified symptoms and signs involving the circulatory and respiratory systems: Secondary | ICD-10-CM | POA: Diagnosis not present

## 2018-07-19 DIAGNOSIS — R69 Illness, unspecified: Secondary | ICD-10-CM | POA: Diagnosis not present

## 2018-07-21 DIAGNOSIS — M79674 Pain in right toe(s): Secondary | ICD-10-CM | POA: Diagnosis not present

## 2018-07-21 DIAGNOSIS — B351 Tinea unguium: Secondary | ICD-10-CM | POA: Diagnosis not present

## 2018-07-21 DIAGNOSIS — M79675 Pain in left toe(s): Secondary | ICD-10-CM | POA: Diagnosis not present

## 2018-07-21 DIAGNOSIS — I739 Peripheral vascular disease, unspecified: Secondary | ICD-10-CM | POA: Diagnosis not present

## 2018-07-21 DIAGNOSIS — L851 Acquired keratosis [keratoderma] palmaris et plantaris: Secondary | ICD-10-CM | POA: Diagnosis not present

## 2018-07-23 DIAGNOSIS — J449 Chronic obstructive pulmonary disease, unspecified: Secondary | ICD-10-CM | POA: Diagnosis not present

## 2018-08-23 DIAGNOSIS — J449 Chronic obstructive pulmonary disease, unspecified: Secondary | ICD-10-CM | POA: Diagnosis not present

## 2018-09-22 DIAGNOSIS — J449 Chronic obstructive pulmonary disease, unspecified: Secondary | ICD-10-CM | POA: Diagnosis not present

## 2018-09-23 ENCOUNTER — Ambulatory Visit (INDEPENDENT_AMBULATORY_CARE_PROVIDER_SITE_OTHER)
Admission: RE | Admit: 2018-09-23 | Discharge: 2018-09-23 | Disposition: A | Discharge status: Home / Self Care | Payer: Medicare HMO | Source: Ambulatory Visit | Attending: Adult Health | Admitting: Adult Health

## 2018-09-23 ENCOUNTER — Encounter: Payer: Self-pay | Admitting: Adult Health

## 2018-09-23 ENCOUNTER — Ambulatory Visit: Payer: Medicare HMO | Admitting: Pulmonary Disease

## 2018-09-23 ENCOUNTER — Ambulatory Visit: Payer: Medicare HMO | Admitting: Adult Health

## 2018-09-23 VITALS — BP 120/60 | HR 70 | Ht 64.0 in | Wt 169.4 lb

## 2018-09-23 DIAGNOSIS — J9612 Chronic respiratory failure with hypercapnia: Secondary | ICD-10-CM | POA: Diagnosis not present

## 2018-09-23 DIAGNOSIS — J449 Chronic obstructive pulmonary disease, unspecified: Secondary | ICD-10-CM

## 2018-09-23 DIAGNOSIS — R0602 Shortness of breath: Secondary | ICD-10-CM | POA: Diagnosis not present

## 2018-09-23 MED ORDER — AZITHROMYCIN 250 MG PO TABS: ORAL_TABLET | ORAL | 0 refills | Status: AC

## 2018-09-23 NOTE — Patient Instructions (Addendum)
Begin Zpack take as directed. -take with food.  Chest xray today  Mucinex DM .Twice daily  As needed  Cough/congestion .  Begin Flutter valve Twice daily  .  Continue on ANORO daily   Continue on Oxygen 3l/m .  May use Albuterol neb every 4hr as needed.  Follow up Dr. Lake Bells or Nickisha Hum NP in 4-6 weeks and As needed   Please contact office for sooner follow up if symptoms do not improve or worsen or seek emergency care

## 2018-09-23 NOTE — Assessment & Plan Note (Addendum)
Suspected exacerbation with upper restaurant infection Check chest x-ray today Add flutter valve   Plan  Patient Instructions  Begin Zpack take as directed. -take with food.  Chest xray today  Mucinex DM .Twice daily  As needed  Cough/congestion .  Begin Flutter valve Twice daily  .  Continue on ANORO daily   Continue on Oxygen 3l/m .  May use Albuterol neb every 4hr as needed.  Follow up Dr. Lake Bells or Labron Bloodgood NP in 4-6 weeks and As needed   Please contact office for sooner follow up if symptoms do not improve or worsen or seek emergency care

## 2018-09-23 NOTE — Progress Notes (Signed)
Re iewed, agree

## 2018-09-23 NOTE — Assessment & Plan Note (Signed)
Continue on O2 

## 2018-09-23 NOTE — Progress Notes (Signed)
@Patient  ID: Emily Owen, female    DOB: [birth year], 84 y.o.   MRN: [id number]  Chief Complaint  Patient presents with  . Acute Visit    COPD     Referring provider: [name], MD  HPI: 84 yo female former smoker followed for severe COPD and oxygen dependent Resp failure   TEST/EVENTS :  June 2016 pulmonary function testing ratio 49%, FEV1 0.85 L (44% predicted, 9% change postbronchodilator), total lung capacity 5.33 L (103% predicted), DLCO 12.21 (48% predicted)  09/23/2018 Acute OV : COPD , O2 RF Patient presents for an acute office visit. She complains of increased dyspnea, cough with thick mucus, says it is very hard to cough it up at times.  Gets winded with minimum activity. Has had an occasional wheeze but not significantly increased.  Mains on oxygen 3 L.  Does not note any lower O2 saturations.Marland Kitchen He remains on Anoro daily.  She was unable to afford Arnuity.  Appetite is good with no nausea vomiting diarrhea.  She denies any chest pain orthopnea PND or increased leg swelling.   No Known Allergies  Immunization History  Administered Date(s) Administered  . Influenza Inj Mdck Quad Pf 07/24/2018  . Influenza Whole 07/24/2014  . Influenza, High Dose Seasonal PF 07/17/2016, 07/16/2017  . Influenza,inj,Quad PF,6+ Mos 07/05/2015  . Pneumococcal Conjugate-13 08/24/2014  . Pneumococcal Polysaccharide-23 03/24/2005    Past Medical History:  Diagnosis Date  . Cancer ([place])    colon  . Cancer of cervix ([place])    cervix  . COPD (chronic obstructive pulmonary disease) ([place])   . Diabetes mellitus without complication ([place])   . Gallstones   . Gout   . Hard of hearing   . Osteoporosis   . Pleural effusion   . Raynaud's disease     Tobacco History: Social History   Tobacco Use  Smoking Status Former Smoker  . Packs/day: 2.00  . Years: 35.00  . Pack years: 70.00  . Types: Cigarettes  . Last attempt to quit: 08/11/1994  . Years since quitting: 24.1    Smokeless Tobacco Never Used   Counseling given: Not Answered   Outpatient Medications Prior to Visit  Medication Sig Dispense Refill  . alendronate (FOSAMAX) 70 MG tablet Take 1 tablet (70 mg total) by mouth every 7 (seven) days. Take with a full glass of water on an empty stomach. 4 tablet 11  . amLODipine (NORVASC) 5 MG tablet Take 1 tablet (5 mg total) by mouth daily. 90 tablet 3  . Ipratropium-Albuterol (COMBIVENT RESPIMAT) 20-100 MCG/ACT AERS respimat Inhale 1 puff into the lungs every 6 (six) hours as needed for wheezing.    . OXYGEN Inhale 3 L into the lungs continuous. [facility]    . rosuvastatin (CRESTOR) 20 MG tablet Take 1 tablet (20 mg total) by mouth daily. 90 tablet 3  . umeclidinium-vilanterol (ANORO ELLIPTA) 62.5-25 MCG/INH AEPB Inhale 1 puff into the lungs daily. 60 each 5  . Colchicine 0.6 MG CAPS 1.2 mg orally at the first sign of a flare followed by 0.6 mg one hour later; MAX 1.8 mg over 1 hour  Then 0.6 BID as needed for gout pain (Patient not taking: Reported on 09/23/2018) 30 capsule 1  . diphenoxylate-atropine (LOMOTIL) 2.5-0.025 MG tablet Take 2 tablets by mouth 4 (four) times daily as needed for diarrhea or loose stools. (Patient not taking: Reported on 09/23/2018) 30 tablet 0  . solifenacin (VESICARE) 10 MG tablet Take 1 tablet (10 mg total)  by mouth daily. (Patient not taking: Reported on 09/23/2018) 90 tablet 3   No facility-administered medications prior to visit.      Review of Systems  Constitutional:   No  weight loss, night sweats,  Fevers, chills,  +fatigue, or  lassitude.  HEENT:   No headaches,  Difficulty swallowing,  Tooth/dental problems, or  Sore throat,                No sneezing, itching, ear ache, nasal congestion, post nasal drip,   CV:  No chest pain,  Orthopnea, PND, swelling in lower extremities, anasarca, dizziness, palpitations, syncope.   GI  No heartburn, indigestion, abdominal pain, nausea, vomiting, diarrhea, change in bowel habits,  loss of appetite, bloody stools.   Resp:   No chest wall deformity  Skin: no rash or lesions.  GU: no dysuria, change in color of urine, no urgency or frequency.  No flank pain, no hematuria   MS:  No joint pain or swelling.  No decreased range of motion.  No back pain.    Physical Exam  BP 120/60 (BP Location: Left Arm, Patient Position: Sitting, Cuff Size: Normal)   Pulse 70   Ht 5\' 4"  (1.626 m)   Wt 169 lb 6.4 oz (76.8 kg)   SpO2 93%   BMI 29.08 kg/m   GEN: A/Ox3; pleasant , NAD, chronically ill-appearing on oxygen   HEENT:  [place]/AT,  EACs-clear, TMs-wnl, NOSE-clear, THROAT-clear, no lesions, no postnasal drip or exudate noted.   NECK:  Supple w/ fair ROM; no JVD; normal carotid impulses w/o bruits; no thyromegaly or nodules palpated; no lymphadenopathy.    RESP decreased breath sounds in the bases w/o, wheezes/ rales/ or rhonchi. no accessory muscle use, no dullness to percussion  CARD:  RRR, no m/r/g, no peripheral edema, pulses intact, no cyanosis or clubbing.  GI:   Soft & nt; nml bowel sounds; no organomegaly or masses detected.   Musco: Warm bil, no deformities or joint swelling noted.   Neuro: alert, no focal deficits noted.    Skin: Warm, no lesions or rashes    Lab Results:  CBC  BMET  BNP No results found for: BNP  ProBNP  Imaging: Dg Chest 2 View  Result Date: 09/23/2018 CLINICAL DATA:  Increased shortness of breath. EXAM: CHEST - 2 VIEW COMPARISON:  February 05, 2017 FINDINGS: A small right pleural effusion is similar in the interval. Opacity under the right effusion is likely atelectasis. The heart, hila, mediastinum, lungs, and pleura are otherwise unchanged. IMPRESSION: Stable small right pleural effusion with underlying atelectasis. No other change. Electronically Signed   By: Dorise Bullion III M.D   On: 09/23/2018 15:56      PFT Results Latest Ref Rng & Units 04/06/2015  FVC-Predicted Pre % 62  FVC-Post L 1.72  FVC-Predicted Post % 67    Pre FEV1/FVC % % 48  Post FEV1/FCV % % 49  FEV1-Pre L 0.77  FEV1-Predicted Pre % 40  FEV1-Post L 0.85  DLCO UNC% % 48  DLCO COR %Predicted % 80  TLC L 5.33  TLC % Predicted % 103  RV % Predicted % 140    No results found for: NITRICOXIDE      Assessment & Plan:   COPD, severe ([place]) Suspected exacerbation with upper restaurant infection Check chest x-ray today  Plan  Patient Instructions  Begin Zpack take as directed. -take with food.  Chest xray today  Mucinex DM .Twice daily  As needed  Cough/congestion .  Begin Flutter valve Twice daily  .  Continue on ANORO daily   Continue on Oxygen 3l/m .  May use Albuterol neb every 4hr as needed.  Follow up Dr. Lake Bells or Katalin Colledge NP in 4-6 weeks and As needed   Please contact office for sooner follow up if symptoms do not improve or worsen or seek emergency care        Chronic respiratory failure with hypercapnia ([place]) Continue on O2 .      Rexene Edison, NP 09/23/2018

## 2018-09-24 NOTE — Progress Notes (Signed)
Called spoke with patient, advised of cxr results / recs as stated by TP.  Pt verbalized understanding and denied any questions.  Pt unable to come for appt in 5 days >> appt scheduled for 12.11.19 @ 1115 with TP.

## 2018-10-01 ENCOUNTER — Ambulatory Visit: Payer: Medicare HMO | Admitting: Adult Health

## 2018-10-01 ENCOUNTER — Telehealth: Payer: Self-pay | Admitting: Adult Health

## 2018-10-01 ENCOUNTER — Ambulatory Visit (INDEPENDENT_AMBULATORY_CARE_PROVIDER_SITE_OTHER)
Admission: RE | Admit: 2018-10-01 | Discharge: 2018-10-01 | Disposition: A | Discharge status: Home / Self Care | Payer: Medicare HMO | Source: Ambulatory Visit | Attending: Adult Health | Admitting: Adult Health

## 2018-10-01 ENCOUNTER — Encounter: Payer: Self-pay | Admitting: Adult Health

## 2018-10-01 DIAGNOSIS — R05 Cough: Secondary | ICD-10-CM | POA: Diagnosis not present

## 2018-10-01 DIAGNOSIS — J9612 Chronic respiratory failure with hypercapnia: Secondary | ICD-10-CM | POA: Diagnosis not present

## 2018-10-01 DIAGNOSIS — R0603 Acute respiratory distress: Secondary | ICD-10-CM | POA: Diagnosis not present

## 2018-10-01 DIAGNOSIS — J449 Chronic obstructive pulmonary disease, unspecified: Secondary | ICD-10-CM

## 2018-10-01 DIAGNOSIS — R0602 Shortness of breath: Secondary | ICD-10-CM | POA: Diagnosis not present

## 2018-10-01 DIAGNOSIS — R0600 Dyspnea, unspecified: Secondary | ICD-10-CM | POA: Diagnosis not present

## 2018-10-01 DIAGNOSIS — J90 Pleural effusion, not elsewhere classified: Secondary | ICD-10-CM | POA: Diagnosis not present

## 2018-10-01 MED ORDER — FLUTTER DEVI: 0 refills | Status: AC

## 2018-10-01 NOTE — Progress Notes (Signed)
@Patient  ID: Emily Owen, female    DOB: 29-Feb-1930, 84 y.o.   MRN: [id number]  Chief Complaint  Patient presents with  . Follow-up    COPD     Referring provider: [name], MD  HPI: 84 yo female former smoker followed for severe COPD and oxygen dependent Resp failure   TEST/EVENTS :  June 2016 pulmonary function testing ratio 49%, FEV1 0.85 L (44% predicted, 9% change postbronchodilator), total lung capacity 5.33 L (103% predicted), DLCO 12.21 (48% predicted)   10/01/2018 Follow up : COPD , O2 RF  Patient presents for a one-week follow-up.  Patient was seen last visit with a COPD exacerbation. She was started on a Z-Pak.  Chest x-ray shows some increased atelectasis along the right base.  Patient is a chronic small pleural effusion that was stable.  Since last visit patient says she is feeling some better. Cough and congestion are improved. Appetite is good. No leg swelling .  Chest x-ray today shows no significant change in right pleural effusion that has been chronic.  No increased atelectasis. Remains on ANORO daily and Oxygen 3l/m  Flutter valve recommended last ov , but did not get. Will set  Up today .     No Known Allergies  Immunization History  Administered Date(s) Administered  . Influenza Inj Mdck Quad Pf 07/24/2018  . Influenza Whole 07/24/2014  . Influenza, High Dose Seasonal PF 07/17/2016, 07/16/2017  . Influenza,inj,Quad PF,6+ Mos 07/05/2015  . Pneumococcal Conjugate-13 08/24/2014  . Pneumococcal Polysaccharide-23 03/24/2005    Past Medical History:  Diagnosis Date  . Cancer ([place])    colon  . Cancer of cervix ([place])    cervix  . COPD (chronic obstructive pulmonary disease) ([place])   . Diabetes mellitus without complication ([place])   . Gallstones   . Gout   . Hard of hearing   . Osteoporosis   . Pleural effusion   . Raynaud's disease     Tobacco History: Social History   Tobacco Use  Smoking Status Former Smoker  . Packs/day:  2.00  . Years: 35.00  . Pack years: 70.00  . Types: Cigarettes  . Last attempt to quit: 08/11/1994  . Years since quitting: 24.1  Smokeless Tobacco Never Used   Counseling given: Not Answered   Outpatient Medications Prior to Visit  Medication Sig Dispense Refill  . albuterol (PROVENTIL) (2.5 MG/3ML) 0.083% nebulizer solution Take 2.5 mg by nebulization every 4 (four) hours as needed for wheezing or shortness of breath.    Marland Kitchen alendronate (FOSAMAX) 70 MG tablet Take 1 tablet (70 mg total) by mouth every 7 (seven) days. Take with a full glass of water on an empty stomach. 4 tablet 11  . amLODipine (NORVASC) 5 MG tablet Take 1 tablet (5 mg total) by mouth daily. 90 tablet 3  . Colchicine 0.6 MG CAPS 1.2 mg orally at the first sign of a flare followed by 0.6 mg one hour later; MAX 1.8 mg over 1 hour  Then 0.6 BID as needed for gout pain 30 capsule 1  . diphenoxylate-atropine (LOMOTIL) 2.5-0.025 MG tablet Take 2 tablets by mouth 4 (four) times daily as needed for diarrhea or loose stools. 30 tablet 0  . Ipratropium-Albuterol (COMBIVENT RESPIMAT) 20-100 MCG/ACT AERS respimat Inhale 1 puff into the lungs every 6 (six) hours as needed for wheezing.    . OXYGEN Inhale 3 L into the lungs continuous. [facility]    . rosuvastatin (CRESTOR) 20 MG tablet Take 1 tablet (  20 mg total) by mouth daily. 90 tablet 3  . solifenacin (VESICARE) 10 MG tablet Take 1 tablet (10 mg total) by mouth daily. 90 tablet 3  . umeclidinium-vilanterol (ANORO ELLIPTA) 62.5-25 MCG/INH AEPB Inhale 1 puff into the lungs daily. 60 each 5   No facility-administered medications prior to visit.      Review of Systems  Constitutional:   No  weight loss, night sweats,  Fevers, chills,  +fatigue, or  lassitude.  HEENT:   No headaches,  Difficulty swallowing,  Tooth/dental problems, or  Sore throat,                No sneezing, itching, ear ache, nasal congestion, post nasal drip,   CV:  No chest pain,  Orthopnea, PND, swelling in  lower extremities, anasarca, dizziness, palpitations, syncope.   GI  No heartburn, indigestion, abdominal pain, nausea, vomiting, diarrhea, change in bowel habits, loss of appetite, bloody stools.   Resp:    No chest wall deformity  Skin: no rash or lesions.  GU: no dysuria, change in color of urine, no urgency or frequency.  No flank pain, no hematuria   MS:  No joint pain or swelling.  No decreased range of motion.  No back pain.    Physical Exam  BP 128/60 (BP Location: Right Arm, Cuff Size: Normal)   Pulse 60   Ht 5\' 4"  (1.626 m)   Wt 169 lb (76.7 kg)   SpO2 96%   BMI 29.01 kg/m   GEN: A/Ox3; pleasant , NAD, elderly , on O2   HEENT:  [place]/AT,  EACs-clear, TMs-wnl, NOSE-clear, THROAT-clear, no lesions, no postnasal drip or exudate noted.   NECK:  Supple w/ fair ROM; no JVD; normal carotid impulses w/o bruits; no thyromegaly or nodules palpated; no lymphadenopathy.    RESP  Decreased BS in bases   no accessory muscle use, no dullness to percussion  CARD:  RRR, no m/r/g, no peripheral edema, pulses intact, no cyanosis or clubbing.  GI:   Soft & nt; nml bowel sounds; no organomegaly or masses detected.   Musco: Warm bil, no deformities or joint swelling noted.   Neuro: alert, no focal deficits noted.    Skin: Warm, no lesions or rashes    Lab Results:    BNP No results found for: BNP  ProBNP  Imaging: Dg Chest 2 View  Result Date: 10/01/2018 CLINICAL DATA:  Chronic cough and COPD EXAM: CHEST - 2 VIEW COMPARISON:  09/23/2018 FINDINGS: Stable small right-sided pleural effusion is noted. Mild right basilar atelectasis with mild volume loss is noted on the right. Cardiac shadow is stable. No new focal infiltrate or sizable effusion is seen. IMPRESSION: Stable right effusion and mild basilar atelectasis. Electronically Signed   By: Inez Catalina M.D.   On: 10/01/2018 10:22   Dg Chest 2 View  Result Date: 09/23/2018 CLINICAL DATA:  Increased shortness of breath.  EXAM: CHEST - 2 VIEW COMPARISON:  February 05, 2017 FINDINGS: A small right pleural effusion is similar in the interval. Opacity under the right effusion is likely atelectasis. The heart, hila, mediastinum, lungs, and pleura are otherwise unchanged. IMPRESSION: Stable small right pleural effusion with underlying atelectasis. No other change. Electronically Signed   By: Dorise Bullion III M.D   On: 09/23/2018 15:56      PFT Results Latest Ref Rng & Units 04/06/2015  FVC-Predicted Pre % 62  FVC-Post L 1.72  FVC-Predicted Post % 67  Pre FEV1/FVC % % 48  Post FEV1/FCV % % 49  FEV1-Pre L 0.77  FEV1-Predicted Pre % 40  FEV1-Post L 0.85  DLCO UNC% % 48  DLCO COR %Predicted % 80  TLC L 5.33  TLC % Predicted % 103  RV % Predicted % 140    No results found for: NITRICOXIDE      Assessment & Plan:   COPD, severe ([facility]) Recent flare with Bronchitis now resolving  CXR w./ chronic changes- stable right pleural effusion/atx   Plan  Patient Instructions  Mucinex DM .Twice daily  As needed  Cough/congestion .  Begin Flutter valve Twice daily  .  Continue on ANORO daily  Continue on Oxygen 3l/m .  May use Albuterol neb every 4hr as needed.  Follow up Dr. Lake Bells as planned next month and As needed   Please contact office for sooner follow up if symptoms do not improve or worsen or seek emergency care        Chronic respiratory failure with hypercapnia ([place]) Cont on o2 .      Rexene Edison, NP 10/01/2018

## 2018-10-01 NOTE — Telephone Encounter (Signed)
Order has been placed for the cxr. Called Xray dept at [facility] and spoke with Mardene Celeste letting her know that the order had been placed for the cxr. Nothing further needed.

## 2018-10-01 NOTE — Patient Instructions (Addendum)
Mucinex DM .Twice daily  As needed  Cough/congestion .  Begin Flutter valve Twice daily  .  Continue on ANORO daily  Continue on Oxygen 3l/m .  May use Albuterol neb every 4hr as needed.  Follow up Dr. Lake Bells as planned next month and As needed   Please contact office for sooner follow up if symptoms do not improve or worsen or seek emergency care

## 2018-10-01 NOTE — Assessment & Plan Note (Signed)
Recent flare with Bronchitis now resolving  CXR w./ chronic changes- stable right pleural effusion/atx   Plan  Patient Instructions  Mucinex DM .Twice daily  As needed  Cough/congestion .  Begin Flutter valve Twice daily  .  Continue on ANORO daily  Continue on Oxygen 3l/m .  May use Albuterol neb every 4hr as needed.  Follow up Dr. Lake Bells as planned next month and As needed   Please contact office for sooner follow up if symptoms do not improve or worsen or seek emergency care

## 2018-10-01 NOTE — Assessment & Plan Note (Signed)
Cont on o2 .  

## 2018-10-01 NOTE — Progress Notes (Signed)
Reviewed, agree 

## 2018-10-23 DIAGNOSIS — J449 Chronic obstructive pulmonary disease, unspecified: Secondary | ICD-10-CM | POA: Diagnosis not present

## 2018-10-27 DIAGNOSIS — M79675 Pain in left toe(s): Secondary | ICD-10-CM | POA: Diagnosis not present

## 2018-10-27 DIAGNOSIS — B351 Tinea unguium: Secondary | ICD-10-CM | POA: Diagnosis not present

## 2018-10-27 DIAGNOSIS — I739 Peripheral vascular disease, unspecified: Secondary | ICD-10-CM | POA: Diagnosis not present

## 2018-10-27 DIAGNOSIS — M79674 Pain in right toe(s): Secondary | ICD-10-CM | POA: Diagnosis not present

## 2018-10-27 DIAGNOSIS — L851 Acquired keratosis [keratoderma] palmaris et plantaris: Secondary | ICD-10-CM | POA: Diagnosis not present

## 2018-10-28 ENCOUNTER — Ambulatory Visit: Payer: Medicare HMO | Admitting: Pulmonary Disease

## 2018-11-03 DIAGNOSIS — Z6830 Body mass index (BMI) 30.0-30.9, adult: Secondary | ICD-10-CM | POA: Diagnosis not present

## 2018-11-03 DIAGNOSIS — J9610 Chronic respiratory failure, unspecified whether with hypoxia or hypercapnia: Secondary | ICD-10-CM | POA: Diagnosis not present

## 2018-11-03 DIAGNOSIS — E119 Type 2 diabetes mellitus without complications: Secondary | ICD-10-CM | POA: Diagnosis not present

## 2018-11-03 DIAGNOSIS — E669 Obesity, unspecified: Secondary | ICD-10-CM | POA: Diagnosis not present

## 2018-11-03 DIAGNOSIS — Z008 Encounter for other general examination: Secondary | ICD-10-CM | POA: Diagnosis not present

## 2018-11-03 DIAGNOSIS — Z7983 Long term (current) use of bisphosphonates: Secondary | ICD-10-CM | POA: Diagnosis not present

## 2018-11-03 DIAGNOSIS — M810 Age-related osteoporosis without current pathological fracture: Secondary | ICD-10-CM | POA: Diagnosis not present

## 2018-11-03 DIAGNOSIS — E785 Hyperlipidemia, unspecified: Secondary | ICD-10-CM | POA: Diagnosis not present

## 2018-11-03 DIAGNOSIS — H3530 Unspecified macular degeneration: Secondary | ICD-10-CM | POA: Diagnosis not present

## 2018-11-03 DIAGNOSIS — J449 Chronic obstructive pulmonary disease, unspecified: Secondary | ICD-10-CM | POA: Diagnosis not present

## 2018-11-03 DIAGNOSIS — R197 Diarrhea, unspecified: Secondary | ICD-10-CM | POA: Diagnosis not present

## 2018-11-08 ENCOUNTER — Emergency Department (HOSPITAL_COMMUNITY)
Admission: EM | Admit: 2018-11-08 | Discharge: 2018-11-08 | Disposition: A | Discharge status: Home / Self Care | Payer: Medicare HMO | Attending: Emergency Medicine | Admitting: Emergency Medicine

## 2018-11-08 ENCOUNTER — Emergency Department (HOSPITAL_COMMUNITY): Payer: Medicare HMO

## 2018-11-08 ENCOUNTER — Encounter (HOSPITAL_COMMUNITY): Payer: Self-pay | Admitting: Emergency Medicine

## 2018-11-08 ENCOUNTER — Other Ambulatory Visit: Payer: Self-pay

## 2018-11-08 DIAGNOSIS — J449 Chronic obstructive pulmonary disease, unspecified: Secondary | ICD-10-CM | POA: Insufficient documentation

## 2018-11-08 DIAGNOSIS — W010XXA Fall on same level from slipping, tripping and stumbling without subsequent striking against object, initial encounter: Secondary | ICD-10-CM | POA: Diagnosis not present

## 2018-11-08 DIAGNOSIS — S72411A Displaced unspecified condyle fracture of lower end of right femur, initial encounter for closed fracture: Secondary | ICD-10-CM | POA: Diagnosis not present

## 2018-11-08 DIAGNOSIS — S8992XA Unspecified injury of left lower leg, initial encounter: Secondary | ICD-10-CM | POA: Diagnosis not present

## 2018-11-08 DIAGNOSIS — Y9389 Activity, other specified: Secondary | ICD-10-CM | POA: Insufficient documentation

## 2018-11-08 DIAGNOSIS — Z87891 Personal history of nicotine dependence: Secondary | ICD-10-CM | POA: Diagnosis not present

## 2018-11-08 DIAGNOSIS — S60419A Abrasion of unspecified finger, initial encounter: Secondary | ICD-10-CM

## 2018-11-08 DIAGNOSIS — Y92009 Unspecified place in unspecified non-institutional (private) residence as the place of occurrence of the external cause: Secondary | ICD-10-CM | POA: Diagnosis not present

## 2018-11-08 DIAGNOSIS — S72434A Nondisplaced fracture of medial condyle of right femur, initial encounter for closed fracture: Secondary | ICD-10-CM | POA: Diagnosis not present

## 2018-11-08 DIAGNOSIS — I10 Essential (primary) hypertension: Secondary | ICD-10-CM | POA: Diagnosis not present

## 2018-11-08 DIAGNOSIS — S72431A Displaced fracture of medial condyle of right femur, initial encounter for closed fracture: Secondary | ICD-10-CM | POA: Diagnosis not present

## 2018-11-08 DIAGNOSIS — S52502A Unspecified fracture of the lower end of left radius, initial encounter for closed fracture: Secondary | ICD-10-CM | POA: Insufficient documentation

## 2018-11-08 DIAGNOSIS — S62611A Displaced fracture of proximal phalanx of left index finger, initial encounter for closed fracture: Secondary | ICD-10-CM | POA: Diagnosis not present

## 2018-11-08 DIAGNOSIS — E119 Type 2 diabetes mellitus without complications: Secondary | ICD-10-CM | POA: Insufficient documentation

## 2018-11-08 DIAGNOSIS — Z79899 Other long term (current) drug therapy: Secondary | ICD-10-CM | POA: Diagnosis not present

## 2018-11-08 DIAGNOSIS — M25561 Pain in right knee: Secondary | ICD-10-CM | POA: Diagnosis not present

## 2018-11-08 DIAGNOSIS — E785 Hyperlipidemia, unspecified: Secondary | ICD-10-CM | POA: Diagnosis not present

## 2018-11-08 DIAGNOSIS — S62102A Fracture of unspecified carpal bone, left wrist, initial encounter for closed fracture: Secondary | ICD-10-CM

## 2018-11-08 DIAGNOSIS — S0990XA Unspecified injury of head, initial encounter: Secondary | ICD-10-CM | POA: Diagnosis not present

## 2018-11-08 DIAGNOSIS — S52602A Unspecified fracture of lower end of left ulna, initial encounter for closed fracture: Secondary | ICD-10-CM | POA: Insufficient documentation

## 2018-11-08 DIAGNOSIS — R51 Headache: Secondary | ICD-10-CM | POA: Diagnosis not present

## 2018-11-08 DIAGNOSIS — S52692A Other fracture of lower end of left ulna, initial encounter for closed fracture: Secondary | ICD-10-CM | POA: Diagnosis not present

## 2018-11-08 DIAGNOSIS — S60411A Abrasion of left index finger, initial encounter: Secondary | ICD-10-CM | POA: Insufficient documentation

## 2018-11-08 DIAGNOSIS — S62641A Nondisplaced fracture of proximal phalanx of left index finger, initial encounter for closed fracture: Secondary | ICD-10-CM

## 2018-11-08 DIAGNOSIS — Y998 Other external cause status: Secondary | ICD-10-CM | POA: Diagnosis not present

## 2018-11-08 DIAGNOSIS — M25562 Pain in left knee: Secondary | ICD-10-CM | POA: Diagnosis not present

## 2018-11-08 DIAGNOSIS — W19XXXA Unspecified fall, initial encounter: Secondary | ICD-10-CM

## 2018-11-08 DIAGNOSIS — S8991XA Unspecified injury of right lower leg, initial encounter: Secondary | ICD-10-CM | POA: Diagnosis not present

## 2018-11-08 DIAGNOSIS — S52592A Other fractures of lower end of left radius, initial encounter for closed fracture: Secondary | ICD-10-CM | POA: Diagnosis not present

## 2018-11-08 DIAGNOSIS — R609 Edema, unspecified: Secondary | ICD-10-CM | POA: Diagnosis not present

## 2018-11-08 DIAGNOSIS — S72491A Other fracture of lower end of right femur, initial encounter for closed fracture: Secondary | ICD-10-CM

## 2018-11-08 HISTORY — DX: Dependence on supplemental oxygen: Z99.81

## 2018-11-08 MED ORDER — ACETAMINOPHEN 325 MG PO TABS
650.0000 mg | ORAL_TABLET | Freq: Once | ORAL | Status: AC
  Administered 2018-11-08: 650 mg via ORAL
  Filled 2018-11-08: qty 2

## 2018-11-08 NOTE — Discharge Instructions (Addendum)
Take over the counter tylenol, as directed on packaging, as needed for discomfort.  Apply moist heat or ice to the area(s) of discomfort, for 15 minutes at a time, several times per day for the next few days.  Do not fall asleep on a heating or ice pack. Walk with your walker. Wear your right knee sleeve until you are seen in follow up. Wear the splint on your wrist and index finger until you are seen in follow up. Elevate your left forearm/hand as much as possible.  Wash the abraded area gently with soap and water, and pat dry, at least twice a day, and cover with a clean/dry dressing.  Change the dressing whenever it becomes wet or soiled after washing the area with soap and water and patting dry. Call your regular medical doctor on Monday to schedule a follow up appointment in the next 3 days for a re-check. Call the Orthopedic doctor on Monday to schedule a follow up appointment this week.  Return to the Emergency Department immediately if worsening.

## 2018-11-08 NOTE — ED Triage Notes (Signed)
Pt fell this morning at home, states she tripped, denies LOC. Pt alert and oriented upon arrival to ED. Per EMS [id number]. Pt reports left wrist and 2nd digit with superficial laceration, bleeding controlled. No head injury.

## 2018-11-08 NOTE — ED Provider Notes (Signed)
[facility] EMERGENCY DEPARTMENT Provider Note   CSN: [id number] Arrival date & time: 11/08/18  3299     History   Chief Complaint Chief Complaint  Patient presents with  . Fall    HPI Emily Owen is a 85 y.o. female.  HPI  Pt was seen at 0910. Per EMS, pt and family: c/o sudden onset and resolution of one episode of slip and fall that occurred PTA. Pt states she was grabbing onto her potty chair when she slipped and fell. Pt states she fell to her knees and left hand. Pt c/o acute flair of her chronic bilat knees pain, left wrist pain, left index finger wound. Pt endorses chronic "nerve issues" with her left wrist and hand that have been ongoing "for years" following an elbow injury. Denies syncope/LOC, no AMS, no neck or back pain, no CP, no increased SOB from baseline COPD/O2 dependence, no abd pain, no N/V/D, no focal motor weakness, no tingling/numbness in extremities.    Td UTD per family Past Medical History:  Diagnosis Date  . Cancer ([place])    colon  . Cancer of cervix ([place])    cervix  . COPD (chronic obstructive pulmonary disease) ([place])   . Diabetes mellitus without complication ([place])   . Gallstones   . Gout   . Hard of hearing   . On home O2   . Osteoporosis   . Pleural effusion   . Raynaud's disease     Patient Active Problem List   Diagnosis Date Noted  . Raynaud's disease   . Pancreatitis 02/13/2018  . Routine general medical examination at a health care facility 07/19/2017  . Gout 03/14/2016  . Atherosclerosis of aorta ([place]) 06/11/2015  . Diabetes mellitus with peripheral vascular disease ([place])   . Chronic respiratory failure with hypercapnia ([place]) 06/02/2015  . COPD, severe ([place]) 03/25/2015  . Pleural effusion on right 03/25/2015  . HTN (hypertension) 08/11/2014  . Hyperlipidemia 08/20/2012    Past Surgical History:  Procedure Laterality Date  . ABDOMINAL HYSTERECTOMY     for cervical cancer 1965  . COLON SURGERY     hemicolectomy 1998   . gallstone surgery     ERCP for gallstone pancreatitis  . ORTHOPEDIC SURGERY       OB History    Gravida  6   Para  6   Term  6   Preterm      AB      Living  5     SAB      TAB      Ectopic      Multiple      Live Births               Home Medications    Prior to Admission medications   Medication Sig Start Date End Date Taking? Authorizing Provider  albuterol (PROVENTIL) (2.5 MG/3ML) 0.083% nebulizer solution Take 2.5 mg by nebulization every 4 (four) hours as needed for wheezing or shortness of breath.    [provider]  alendronate (FOSAMAX) 70 MG tablet Take 1 tablet (70 mg total) by mouth every 7 (seven) days. Take with a full glass of water on an empty stomach. 02/25/18   [name], MD  amLODipine (NORVASC) 5 MG tablet Take 1 tablet (5 mg total) by mouth daily. 03/05/18   [name], MD  Colchicine 0.6 MG CAPS 1.2 mg orally at the first sign of a flare followed by 0.6 mg one hour later;  MAX 1.8 mg over 1 hour  Then 0.6 BID as needed for gout pain 03/11/18   Margarita Mail, PA-C  diphenoxylate-atropine (LOMOTIL) 2.5-0.025 MG tablet Take 2 tablets by mouth 4 (four) times daily as needed for diarrhea or loose stools. 05/30/18   [name], MD  Ipratropium-Albuterol (COMBIVENT RESPIMAT) 20-100 MCG/ACT AERS respimat Inhale 1 puff into the lungs every 6 (six) hours as needed for wheezing.    [provider]  OXYGEN Inhale 3 L into the lungs continuous. [employer]    [provider]  Respiratory Therapy Supplies (FLUTTER) DEVI Use as directed. 10/01/18   Parrett, Fonnie Mu, NP  rosuvastatin (CRESTOR) 20 MG tablet Take 1 tablet (20 mg total) by mouth daily. 02/27/18   [name], MD  solifenacin (VESICARE) 10 MG tablet Take 1 tablet (10 mg total) by mouth daily. 02/27/18   [name], MD  umeclidinium-vilanterol (ANORO ELLIPTA) 62.5-25 MCG/INH AEPB Inhale 1 puff into the lungs daily. 05/02/18   Juanito Doom, MD     Family History Family History  Problem Relation Age of Onset  . Diabetes Sister   . Diabetes Sister   . Diabetes Sister   . Hypertension Mother   . Heart disease Mother   . Diabetes Sister     Social History Social History   Tobacco Use  . Smoking status: Former Smoker    Packs/day: 2.00    Years: 35.00    Pack years: 70.00    Types: Cigarettes    Last attempt to quit: 08/11/1994    Years since quitting: 24.2  . Smokeless tobacco: Never Used  Substance Use Topics  . Alcohol use: No    Alcohol/week: 0.0 standard drinks  . Drug use: No     Allergies   Patient has no known allergies.   Review of Systems Review of Systems ROS: Statement: All systems negative except as marked or noted in the HPI; Constitutional: Negative for fever and chills. ; ; Eyes: Negative for eye pain, redness and discharge. ; ; ENMT: Negative for ear pain, hoarseness, nasal congestion, sinus pressure and sore throat. ; ; Cardiovascular: Negative for chest pain, palpitations, diaphoresis, dyspnea and peripheral edema. ; ; Respiratory: Negative for cough, wheezing and stridor. ; ; Gastrointestinal: Negative for nausea, vomiting, diarrhea, abdominal pain, blood in stool, hematemesis, jaundice and rectal bleeding. . ; ; Genitourinary: Negative for dysuria, flank pain and hematuria. ; ; Musculoskeletal: +knees, pain, wrist pain. Negative for back pain and neck pain. Negative for open wounds..; ; Skin: +abrasion, ecchymosis. Negative for pruritus, rash, blisters, and skin lesion.; ; Neuro: Negative for headache, lightheadedness and neck stiffness. Negative for weakness, altered level of consciousness, altered mental status, extremity weakness, paresthesias, involuntary movement, seizure and syncope.       Physical Exam Updated Vital Signs BP 130/87   Pulse 60   Temp 97.9 F (36.6 C) (Oral)   Resp 18   Ht 5\' 5"  (1.651 m)   Wt 77.1 kg   SpO2 98%   BMI 28.29 kg/m   Physical Exam 0915: Physical  examination: Vital signs and O2 SAT: Reviewed; Constitutional: Well developed, Well nourished, Well hydrated, In no acute distress; Head and Face: Normocephalic, Atraumatic; Eyes: EOMI, PERRL, No scleral icterus; ENMT: Mouth and pharynx normal, Mucous membranes moist; Neck: Immobilized in C-collar, Trachea midline. No abrasions or ecchymosis.; Spine: No midline CS, TS, LS tenderness.; Cardiovascular: Regular rate and rhythm, No gallop; Respiratory: Breath sounds clear & equal bilaterally, No wheezes, Normal  respiratory effort/excursion; Chest: Nontender, No deformity, Movement normal, No crepitus, No abrasions or ecchymosis.; Abdomen: Soft, Nontender, Nondistended, Normal bowel sounds, No abrasions or ecchymosis.; Genitourinary: No CVA tenderness;; Extremities:  +generalized left wrist pain with hematoma forming proximal dorsal hand/wrist. No snuffbox tenderness.  No pain to axial thumb or 3rd MCP loading.  Forearm compartments soft, strong radial pp, brisk cap refill in fingers. Hand NMS intact. NT left shoulder/elbow/hand. +left index finger ecchymosis and localized edema, small superficial abrasion medial distal phalange area. +chronic left 5th finger held in extension. +left hand/fingers sensitivities to palpation (chronic). +FROM bilateral knees, including able to lift extended bilateral LE's off stretcher, and extend bilateral lower legs against resistance.  No ligamentous laxity.  No patellar or quad tendon step-offs.  NMS intact bilateral feet, strong pedal pp. +plantarflexion of bilateral feet w/calf squeeze.  No palpable gap bilateral Achilles's tendons.  No proximal fibular head tenderness bilat.  No open wounds, edema, erythema, warmth, ecchymosis or deformity.  No specific area of point tenderness. NT bilateral hip/ankle/foot. Otherwise full range of motion major/large joints of bilat UE's and LE's without pain or tenderness to palp, Neurovascularly intact, Pulses normal, No deformity. No tenderness,  No edema, Pelvis stable; Neuro: AA&Ox3, +HOH. Major CN grossly intact. Speech clear. No gross focal motor or new sensory deficits in extremities.; Skin: Color normal, Warm, Dry    ED Treatments / Results  Labs (all labs ordered are listed, but only abnormal results are displayed)   EKG None  Radiology   Procedures Procedures (including critical care time)  Medications Ordered in ED Medications - No data to display   Initial Impression / Assessment and Plan / ED Course  I have reviewed the triage vital signs and the nursing notes.  Pertinent labs & imaging results that were available during my care of the patient were reviewed by me and considered in my medical decision making (see chart for details).  MDM Reviewed: previous chart, nursing note and vitals Interpretation: x-ray and CT scan    Dg Wrist Complete Left Result Date: 11/08/2018 CLINICAL DATA:  Fall.  Left wrist and hand pain. EXAM: LEFT WRIST - COMPLETE 3+ VIEW COMPARISON:  None. FINDINGS: There are transverse fractures of the distal radial and ulnar are metaphyses. Fractures are nondisplaced. There is mild dorsal impaction of the distal radial fracture leading to dorsal angulation of the articular surface of approximately 18 degrees. Wrist joints remain normally aligned. There is a partly imaged fracture of the proximal phalanx of the second finger. No other fractures. Bones are diffusely demineralized. There is soft tissue swelling surrounding the wrist. IMPRESSION: 1. Transverse, nondisplaced fractures of the distal left radius and ulna at the metaphyses. Dorsal impaction of the distal radial fracture leads to approximately 18 degrees of dorsal angulation of the radial articular surface. 2. Fracture of the proximal phalanx of the second finger. 3. No dislocation. Electronically Signed   By: Lajean Manes M.D.   On: 11/08/2018 10:43   Ct Head Wo Contrast Result Date: 11/08/2018 CLINICAL DATA:  Pain following fall EXAM:  CT HEAD WITHOUT CONTRAST TECHNIQUE: Contiguous axial images were obtained from the base of the skull through the vertex without intravenous contrast. COMPARISON:  July 11, 2015 FINDINGS: Brain: There is moderate diffuse atrophy, stable. There is no intracranial mass, hemorrhage, extra-axial fluid collection, or midline shift. There is small vessel disease throughout the centra semiovale bilaterally. There is small vessel disease in the anterior limbs of the left internal and external carotid arteries. There  is small vessel disease to a lesser degree in the right anterior external capsule region. There is small vessel disease throughout much of the left extreme capsule. No acute appearing infarct is demonstrable. Vascular: No hyperdense vessel evident. There is calcification in a portion of the distal left vertebral artery as well as in the carotid siphon regions. Skull: The bony calvarium appears intact. Sinuses/Orbits: There is mucosal thickening in several ethmoid air cells. There is opacification and a midportion ethmoid air cell on the left. Orbits appear symmetric bilaterally. Other: Visualized mastoid air cells are clear. IMPRESSION: Atrophy with extensive supratentorial small vessel disease, stable. No evident acute infarct. No mass or hemorrhage. There are foci of arterial vascular calcification. There are areas of ethmoid sinus disease. Electronically Signed   By: Lowella Grip III M.D.   On: 11/08/2018 10:25   Ct Knee Right Wo Contrast Result Date: 11/08/2018 CLINICAL DATA:  Right knee pain status post fall EXAM: CT OF THE RIGHT KNEE WITHOUT CONTRAST TECHNIQUE: Multidetector CT imaging of the RIGHT knee was performed according to the standard protocol. Multiplanar CT image reconstructions were also generated. COMPARISON:  None. FINDINGS: Bones/Joint/Cartilage Severe osteopenia. Nondisplaced avulsion fracture along the periphery of medial femoral condyle at the MCL origin. No other acute  fracture or dislocation. Normal alignment. Small joint effusion. Moderate medial femorotibial compartment joint space narrowing. Moderate lateral patellofemoral compartment joint space narrowing. No aggressive osseous lesion. Ligaments Ligaments are suboptimally evaluated by CT. Muscles and Tendons Muscles are normal. Quadriceps tendon and patellar tendon are intact. Soft tissue No fluid collection or hematoma.  No soft tissue mass. IMPRESSION: 1. Nondisplaced avulsion fracture along the periphery of medial femoral condyle at the MCL origin. 2. Moderate osteoarthritis of the medial femorotibial compartment and lateral patellofemoral compartment. Electronically Signed   By: Kathreen Devoid   On: 11/08/2018 11:48   Dg Knee Complete 4 Views Left Result Date: 11/08/2018 CLINICAL DATA:  Fall.  Left knee pain. EXAM: LEFT KNEE - COMPLETE 4+ VIEW COMPARISON:  None. FINDINGS: No fracture or bone lesion. Mild to moderate medial joint space compartment narrowing. Small marginal osteophytes from all 3 compartments. No joint effusion. There are scattered arterial vascular calcifications posteriorly. Soft tissues are otherwise unremarkable. IMPRESSION: 1. No fracture or acute finding. 2. Osteoarthritis. Electronically Signed   By: Lajean Manes M.D.   On: 11/08/2018 10:38   Dg Knee Complete 4 Views Right Result Date: 11/08/2018 CLINICAL DATA:  Fall.  Right knee pain. EXAM: RIGHT KNEE - COMPLETE 4+ VIEW COMPARISON:  None. FINDINGS: Small bone fragment lies along the medial margin of the medial femoral condyle. This may be a chronic finding. It could reflect an avulsion fracture at the medial collateral ligament origin. No other evidence of a fracture. Moderate medial joint space compartment narrowing. There small marginal osteophytes from all 3 compartments. Trace joint effusion. There are vascular calcifications scattered posteriorly. IMPRESSION: 1. Possible avulsion fracture from the medial margin of the medial femoral  condyle at the origin of the medial collateral ligament. This may be a chronic finding. 2. No other evidence of a fracture. 3. Osteoarthritis. Electronically Signed   By: Lajean Manes M.D.   On: 11/08/2018 10:40   Dg Hand Complete Left Result Date: 11/08/2018 CLINICAL DATA:  Fall.  Left hand pain. EXAM: LEFT HAND - COMPLETE 3+ VIEW COMPARISON:  None. FINDINGS: There is a comminuted fracture at the base of the proximal phalanx of the second/index finger. Primary distal fracture component has displaced in an ulnar direction  by 4 mm. The fracture does not involve the MCP joint articular surface. Fractures across the distal radius and ulna metaphyses are again noted, described under the wrist radiographs. No other fractures.  No dislocation. Bones are diffusely demineralized. There is soft tissue swelling at the base of the index finger. IMPRESSION: 1. Comminuted, mildly displaced fracture of the proximal phalanx of the left index finger as detailed. 2. Transverse fractures across the distal metaphyses of the left radius and ulna. 3. No other fractures.  No dislocation. Electronically Signed   By: Lajean Manes M.D.   On: 11/08/2018 10:45    1200:  XR with f/u CT scan as above. Pt with chronic left fingers sensitivities and prefers to hold left wrist in mild flexion. Given resources available, as well as taking into account pt's chronic left wrist/fingers issues, pt left forearm/hand placed in long sugar tong with padding around fingers. Wound care given to index finger abrasion, and this area is left free for home wound care. Abrasion is very superficial and does not need sutures. Family states they do prefer this approach. Pt was able to tolerate this and family is satisfied with this splint/padding. Family would prefer no sling, as pt is mostly chair or bed bound, rarely walking more than a few steps to a commode with a walker/assist. Family will dose APAP for pain.  T/C returned from Ortho Dr. Ninfa [place], case  discussed, including:  HPI, pertinent PM/SHx, VS/PE, dx testing, ED course and treatment:  OK to d/c to f/u in office this week, he recommends knee sleeve or hinged knee brace for right knee findings; he also generously agreed to f/u pt for her wrist/finger fx during office f/u for right femoral condyle avulsion fx, given ED resources he agrees with ED tx with long sugar tong on wrist (OK not to call specific Hand MD at this time). Dx and testing, as well as d/w Ortho MD, d/w pt and family.  Questions answered.  Verb understanding, agreeable to d/c home with outpt f/u.     Final Clinical Impressions(s) / ED Diagnoses   Final diagnoses:  None    ED Discharge Orders    None       Francine Graven, DO 11/12/18 1226

## 2018-11-12 ENCOUNTER — Encounter (INDEPENDENT_AMBULATORY_CARE_PROVIDER_SITE_OTHER): Payer: Self-pay | Admitting: Orthopaedic Surgery

## 2018-11-12 ENCOUNTER — Ambulatory Visit (INDEPENDENT_AMBULATORY_CARE_PROVIDER_SITE_OTHER): Payer: Medicare HMO

## 2018-11-12 ENCOUNTER — Ambulatory Visit (INDEPENDENT_AMBULATORY_CARE_PROVIDER_SITE_OTHER): Payer: Medicare HMO | Admitting: Orthopaedic Surgery

## 2018-11-12 ENCOUNTER — Other Ambulatory Visit: Payer: Self-pay | Admitting: Family Medicine

## 2018-11-12 DIAGNOSIS — S83411A Sprain of medial collateral ligament of right knee, initial encounter: Secondary | ICD-10-CM | POA: Diagnosis not present

## 2018-11-12 DIAGNOSIS — S62611A Displaced fracture of proximal phalanx of left index finger, initial encounter for closed fracture: Secondary | ICD-10-CM

## 2018-11-12 DIAGNOSIS — M79641 Pain in right hand: Secondary | ICD-10-CM | POA: Diagnosis not present

## 2018-11-12 DIAGNOSIS — S52602A Unspecified fracture of lower end of left ulna, initial encounter for closed fracture: Secondary | ICD-10-CM

## 2018-11-12 DIAGNOSIS — S52502A Unspecified fracture of the lower end of left radius, initial encounter for closed fracture: Secondary | ICD-10-CM

## 2018-11-12 MED ORDER — GABAPENTIN 300 MG PO CAPS: 300.0000 mg | ORAL_CAPSULE | Freq: Every evening | ORAL | 1 refills | Status: DC | PRN

## 2018-11-12 NOTE — Progress Notes (Signed)
Office Visit Note   Patient: Emily Owen           Date of Birth: 1930-02-21           MRN: [id number] Visit Date: 11/12/2018              Requested by: [name], MD [id number] [place],  [id number] PCP: [name], MD   Assessment & Plan: Visit Diagnoses:  1. Pain in right hand   2. Closed displaced fracture of proximal phalanx of left index finger, initial encounter   3. Tear of MCL (medial collateral ligament) of knee, right, initial encounter   4. Closed fracture of distal ends of left radius and ulna, initial encounter     Plan: She is definitely not a surgical candidate at all and this can be treated certainly conservatively.  I would actually just put her in a Velcro forearm splint today to the severe swelling on the left side.  We will also get a trigger finger splints on the left side.  This fracture should heal but there may be some deformity associated with it but the family understands that.  She does not need any type of knee brace for her right knee at all.  Would like to see her back just in 1 week for repeat 2 views of her left wrist they can also include the index finger on the left side.  All question concerns were answered and addressed.  She did take a gabapentin from her daughter last night and this helped calm down her nerves.  She would like to have her own prescription for this which is 300 mg to take as needed at night.  I did send that into her pharmacy.  Follow-Up Instructions: Return in about 1 year (around 11/13/2019).   Orders:  Orders Placed This Encounter  Procedures  . XR Hand Complete Right   Meds ordered this encounter  Medications  . gabapentin (NEURONTIN) 300 MG capsule    Sig: Take 1 capsule (300 mg total) by mouth at bedtime as needed.    Dispense:  30 capsule    Refill:  1      Procedures: No procedures performed   Clinical Data: No additional findings.   Subjective: Chief Complaint  Patient  presents with  . Left Wrist - Fracture  . Left Hand - Fracture  . Left Knee - Fracture  The patient is a very pleasant 85 year old female who is O2 dependent and referred from the [facility] emergency room after having a mechanical fall this past Saturday.  She sustained fractures to her left distal radius and ulna as well as a fracture of her left index finger proximal phalanx.  She also sustained an avulsion injury around the [facility] of her right knee.  Her right hand and fingers have been bruising since the fall so the family wanted this x-ray today.  He mainly is in a wheelchair but does not mobilize some.  She is someone who again is on home O2.  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  She is currently in a splint on her left upper extremity.  This is a sugar tong splint.  She is complaining of it being too tight.  HPI  Review of Systems She currently denies any headache, chest pain, shortness of breath, fever, chills, nausea, vomiting  Objective: Vital Signs: There were no  vitals taken for this visit.  Physical Exam She is alert and orient x3 and in no acute distress Ortho Exam On examination of her right hand there is bruising of the ring finger but otherwise she moves her hand and finger normally on the right side.  The left side does have very tight splint to remove the splint.  She has obvious bruising and swelling of the distal radius and ulna with a deformity and significant bruising in the hand with a deformity of her index finger but there is also chronic deformity of her fifth finger.  Her hands are well-perfused.  The skin is intact but frail. Specialty Comments:  No specialty comments available.  Imaging: Xr Hand Complete Right  Result Date: 11/12/2018 3 views of the right hand show no obvious fracture or acute findings.    PMFS History: Patient Active Problem List   Diagnosis Date Noted  .  Raynaud's disease   . Pancreatitis 02/13/2018  . Routine general medical examination at a health care facility 07/19/2017  . Gout 03/14/2016  . Atherosclerosis of aorta ([place]) 06/11/2015  . Diabetes mellitus with peripheral vascular disease ([place])   . Chronic respiratory failure with hypercapnia ([place]) 06/02/2015  . COPD, severe ([place]) 03/25/2015  . Pleural effusion on right 03/25/2015  . HTN (hypertension) 08/11/2014  . Hyperlipidemia 08/20/2012   Past Medical History:  Diagnosis Date  . Cancer ([place])    colon  . Cancer of cervix ([place])    cervix  . COPD (chronic obstructive pulmonary disease) ([place])   . Diabetes mellitus without complication ([place])   . Gallstones   . Gout   . Hard of hearing   . On home O2   . Osteoporosis   . Pleural effusion   . Raynaud's disease     Family History  Problem Relation Age of Onset  . Diabetes Sister   . Diabetes Sister   . Diabetes Sister   . Hypertension Mother   . Heart disease Mother   . Diabetes Sister     Past Surgical History:  Procedure Laterality Date  . ABDOMINAL HYSTERECTOMY     for cervical cancer 1965  . COLON SURGERY     hemicolectomy 1998  . gallstone surgery     ERCP for gallstone pancreatitis  . ORTHOPEDIC SURGERY     Social History   Occupational History  . Not on file  Tobacco Use  . Smoking status: Former Smoker    Packs/day: 2.00    Years: 35.00    Pack years: 70.00    Types: Cigarettes    Last attempt to quit: 08/11/1994    Years since quitting: 24.2  . Smokeless tobacco: Never Used  Substance and Sexual Activity  . Alcohol use: No    Alcohol/week: 0.0 standard drinks  . Drug use: No  . Sexual activity: Not on file

## 2018-11-19 ENCOUNTER — Ambulatory Visit (INDEPENDENT_AMBULATORY_CARE_PROVIDER_SITE_OTHER): Payer: Medicare HMO

## 2018-11-19 ENCOUNTER — Ambulatory Visit (INDEPENDENT_AMBULATORY_CARE_PROVIDER_SITE_OTHER): Payer: Medicare HMO | Admitting: Orthopaedic Surgery

## 2018-11-19 ENCOUNTER — Encounter (INDEPENDENT_AMBULATORY_CARE_PROVIDER_SITE_OTHER): Payer: Self-pay | Admitting: Orthopaedic Surgery

## 2018-11-19 DIAGNOSIS — S52502D Unspecified fracture of the lower end of left radius, subsequent encounter for closed fracture with routine healing: Secondary | ICD-10-CM

## 2018-11-19 DIAGNOSIS — S52602D Unspecified fracture of lower end of left ulna, subsequent encounter for closed fracture with routine healing: Secondary | ICD-10-CM

## 2018-11-19 NOTE — Progress Notes (Signed)
The patient is an elderly 85 year old that we are seeing in follow-up after mechanical fall in which she sustained a right knee MCL injury as well as a left distal radius and distal ulna fracture and a left index finger proximal phalanx fracture.  We are treating all these conservatively.  She is on home O2.  She has been compliant with wearing a removable forearm Velcro wrist splint and a foam splint on her index finger.  She is doing well overall.  On exam out of her left wrist splint, she has significant bruising but the swelling is gone down.  She does not like is trying to move her wrist.  She is able to move her fingers and thumb.  There is no gross deformities other than the swelling and the alignment appears well maintained her hands well-perfused.  Examination of her right knee shows that it is ligamentously stable but certainly painful over the medial joint line.  We can continue to treat these injuries conservative.  She will continue her finger splint as well as the wrist splint.  We will see her back in 3 weeks with a repeat AP and lateral of the left wrist and will also have an AP and lateral of the left index finger.

## 2018-11-23 DIAGNOSIS — J449 Chronic obstructive pulmonary disease, unspecified: Secondary | ICD-10-CM | POA: Diagnosis not present

## 2018-12-04 ENCOUNTER — Ambulatory Visit: Payer: Medicare HMO | Admitting: Family Medicine

## 2018-12-04 ENCOUNTER — Other Ambulatory Visit (INDEPENDENT_AMBULATORY_CARE_PROVIDER_SITE_OTHER): Payer: Self-pay | Admitting: Orthopaedic Surgery

## 2018-12-04 NOTE — Telephone Encounter (Signed)
Please advise 

## 2018-12-09 ENCOUNTER — Other Ambulatory Visit: Payer: Self-pay | Admitting: Pulmonary Disease

## 2018-12-10 ENCOUNTER — Ambulatory Visit (INDEPENDENT_AMBULATORY_CARE_PROVIDER_SITE_OTHER): Payer: Medicare HMO | Admitting: Orthopaedic Surgery

## 2018-12-10 ENCOUNTER — Ambulatory Visit (INDEPENDENT_AMBULATORY_CARE_PROVIDER_SITE_OTHER): Payer: Medicare HMO

## 2018-12-10 ENCOUNTER — Encounter (INDEPENDENT_AMBULATORY_CARE_PROVIDER_SITE_OTHER): Payer: Self-pay | Admitting: Orthopaedic Surgery

## 2018-12-10 DIAGNOSIS — S62611A Displaced fracture of proximal phalanx of left index finger, initial encounter for closed fracture: Secondary | ICD-10-CM

## 2018-12-10 DIAGNOSIS — S52502D Unspecified fracture of the lower end of left radius, subsequent encounter for closed fracture with routine healing: Secondary | ICD-10-CM

## 2018-12-10 DIAGNOSIS — S52602D Unspecified fracture of lower end of left ulna, subsequent encounter for closed fracture with routine healing: Secondary | ICD-10-CM | POA: Diagnosis not present

## 2018-12-10 NOTE — Progress Notes (Signed)
Office Visit Note   Patient: Emily Owen           Date of Birth: 25-Dec-1929           MRN: [id number] Visit Date: 12/10/2018              Requested by: [name], MD [address] [place], [place] [address] PCP: [name], MD   Assessment & Plan: Visit Diagnoses:  1. Closed fracture of left distal radius and ulna, with routine healing, subsequent encounter   2. Closed displaced fracture of proximal phalanx of left index finger, initial encounter     Plan: We will continue the wrist splint and left index finger splint.  She is to work on extension flexion of the elbow and work on elevation of the hand.  Follow-up with [place] in 1 month at that time we will obtain AP lateral oblique views of the left index finger and 2 views of the left wrist.  Follow-Up Instructions: Return in about 4 weeks (around 01/07/2019) for Radiographs.   Orders:  Orders Placed This Encounter  Procedures  . XR Wrist 2 Views Left  . XR Finger Index Left   No orders of the defined types were placed in this encounter.     Procedures: No procedures performed   Clinical Data: No additional findings.   Subjective: Chief Complaint  Patient presents with  . Left Wrist - Follow-up    HPI Mrs. Emily Owen follows up now 3 weeks status post mechanical fall resulting in a left wrist fracture and left index finger fracture.  She states she is sore but otherwise doing okay.  She has been wearing the wrist splint.  She does not have the finger splint in place today.  Her daughter accompanies her.  Patient is on oxygen seated in a wheelchair. Review of Systems See HPI otherwise negative  Objective: Vital Signs: There were no vitals taken for this visit.  Physical Exam Constitutional:      General: She is not in acute distress.    Appearance: She is not toxic-appearing or diaphoretic.  Neurological:     Mental Status: She is alert and oriented to person, place, and time.    Psychiatric:        Mood and Affect: Mood normal.     Ortho Exam Left index finger no gross deformity positive edema and ecchymosis.  Left elbow good range of motion.  Limited supination pronation of arm.  Splint was not removed today. Specialty Comments:  No specialty comments available.  Imaging: Xr Finger Index Left  Result Date: 12/10/2018 Left index finger: Comminuted fracture remains overall good position alignment.  Some early callus formation.  No other fractures identified.  Xr Wrist 2 Views Left  Result Date: 12/10/2018 Left wrist 2 views: Distal radius ulna fracture remain nondisplaced.  No significant callus formation.  No change in overall position alignment.  Osteopenic appearing bone.    PMFS History: Patient Active Problem List   Diagnosis Date Noted  . Raynaud's disease   . Pancreatitis 02/13/2018  . Routine general medical examination at a health care facility 07/19/2017  . Gout 03/14/2016  . Atherosclerosis of aorta ([place]) 06/11/2015  . Diabetes mellitus with peripheral vascular disease ([place])   . Chronic respiratory failure with hypercapnia ([place]) 06/02/2015  . COPD, severe ([place]) 03/25/2015  . Pleural effusion on right 03/25/2015  . HTN (hypertension) 08/11/2014  . Hyperlipidemia 08/20/2012   Past Medical History:  Diagnosis  Date  . Cancer [facility])    colon  . Cancer of cervix ([place])    cervix  . COPD (chronic obstructive pulmonary disease) ([place])   . Diabetes mellitus without complication ([place])   . Gallstones   . Gout   . Hard of hearing   . On home O2   . Osteoporosis   . Pleural effusion   . Raynaud's disease     Family History  Problem Relation Age of Onset  . Diabetes Sister   . Diabetes Sister   . Diabetes Sister   . Hypertension Mother   . Heart disease Mother   . Diabetes Sister     Past Surgical History:  Procedure Laterality Date  . ABDOMINAL HYSTERECTOMY     for cervical cancer 1965  . COLON SURGERY     hemicolectomy 1998  .  gallstone surgery     ERCP for gallstone pancreatitis  . ORTHOPEDIC SURGERY     Social History   Occupational History  . Not on file  Tobacco Use  . Smoking status: Former Smoker    Packs/day: 2.00    Years: 35.00    Pack years: 70.00    Types: Cigarettes    Last attempt to quit: 08/11/1994    Years since quitting: 24.3  . Smokeless tobacco: Never Used  Substance and Sexual Activity  . Alcohol use: No    Alcohol/week: 0.0 standard drinks  . Drug use: No  . Sexual activity: Not on file

## 2018-12-22 DIAGNOSIS — J449 Chronic obstructive pulmonary disease, unspecified: Secondary | ICD-10-CM | POA: Diagnosis not present

## 2019-01-05 ENCOUNTER — Encounter: Payer: Self-pay | Admitting: Family Medicine

## 2019-01-05 ENCOUNTER — Ambulatory Visit (INDEPENDENT_AMBULATORY_CARE_PROVIDER_SITE_OTHER): Payer: Medicare HMO | Admitting: Family Medicine

## 2019-01-05 ENCOUNTER — Other Ambulatory Visit: Payer: Self-pay

## 2019-01-05 VITALS — BP 126/80 | HR 88 | Temp 98.4°F | Resp 16 | Ht 64.0 in

## 2019-01-05 DIAGNOSIS — J441 Chronic obstructive pulmonary disease with (acute) exacerbation: Secondary | ICD-10-CM | POA: Diagnosis not present

## 2019-01-05 DIAGNOSIS — R5381 Other malaise: Secondary | ICD-10-CM | POA: Diagnosis not present

## 2019-01-05 MED ORDER — PREDNISONE 20 MG PO TABS: ORAL_TABLET | ORAL | 0 refills | Status: DC

## 2019-01-05 MED ORDER — LEVOFLOXACIN 500 MG PO TABS: 500.0000 mg | ORAL_TABLET | Freq: Every day | ORAL | 0 refills | Status: DC

## 2019-01-05 NOTE — Progress Notes (Signed)
Subjective:    Patient ID: Emily Owen, female    DOB: May 08, 1930, 85 y.o.   MRN: [id number]  HPI 5 weeks ago, the patient fell.  She injured her left wrist and hand sustaining a fracture.  She is seeing orthopedics for this.  However ever since the fall, she has become progressively more deconditioned.  She spends majority the day in her lift chair.  She has a difficult time even standing.  She is fallen 2 or 3 other times trying to go to the bathroom.  Her legs are getting progressively weaker.  She now has a stage I pressure ulcer on her sacrum.  She spends most of the day sitting.  Because of sitting in her chair, she has become progressively more short of breath.  Today she is 90% on 2 L via nasal cannula.  She is also developed chest congestion and a cough.  She has severe COPD.  On examination today, the patient has diminished breath sounds throughout.  Is very difficult to hear any breath sounds.  She has faint expiratory wheezes bilaterally.  However she has prominent right basilar rales posteriorly she also has rhonchi.  She is also not sleeping well at night.  She has pins-and-needles pain in both feet.  If she moves them it improves.  It keeps her awake.  All night long she is wiggling her legs distal to her knees trying to help the pain.  She started taking gabapentin 300 mg a day under the care of her orthopedist which is helped minimally.  She is interested to see if there is anything else she can do to help her sleep at night.  I discourage sleeping medication due to her falls and balance issues and current likely COPD exacerbation.  However she is not experiencing any sedation or side effects from the gabapentin.  We did discuss increasing the gabapentin and the daughter is interested in trying that. Past Medical History:  Diagnosis Date  . Cancer ([place])    colon  . Cancer of cervix ([place])    cervix  . COPD (chronic obstructive pulmonary disease) ([place])   . Diabetes mellitus  without complication ([place])   . Gallstones   . Gout   . Hard of hearing   . On home O2   . Osteoporosis   . Pleural effusion   . Raynaud's disease    Past Surgical History:  Procedure Laterality Date  . ABDOMINAL HYSTERECTOMY     for cervical cancer 1965  . COLON SURGERY     hemicolectomy 1998  . gallstone surgery     ERCP for gallstone pancreatitis  . ORTHOPEDIC SURGERY     Current Outpatient Medications on File Prior to Visit  Medication Sig Dispense Refill  . albuterol (PROVENTIL) (2.5 MG/3ML) 0.083% nebulizer solution Take 2.5 mg by nebulization every 4 (four) hours as needed for wheezing or shortness of breath.    Marland Kitchen alendronate (FOSAMAX) 70 MG tablet TAKE 1 TABLET BY MOUTH EVERY 7 DAYS. TAKE WITH A FULL GLASS OF WATER ON AN EMPTY STOMACH. 12 tablet 3  . amLODipine (NORVASC) 5 MG tablet Take 1 tablet (5 mg total) by mouth daily. 90 tablet 3  . ANORO ELLIPTA 62.5-25 MCG/INH AEPB TAKE 1 PUFF BY MOUTH EVERY DAY 60 each 5  . Colchicine 0.6 MG CAPS 1.2 mg orally at the first sign of a flare followed by 0.6 mg one hour later; MAX 1.8 mg over 1 hour  Then 0.6 BID as  needed for gout pain 30 capsule 1  . gabapentin (NEURONTIN) 300 MG capsule TAKE 1 CAPSULE (300 MG TOTAL) BY MOUTH AT BEDTIME AS NEEDED. 90 capsule 1  . Ipratropium-Albuterol (COMBIVENT RESPIMAT) 20-100 MCG/ACT AERS respimat Inhale 1 puff into the lungs every 6 (six) hours as needed for wheezing.    . OXYGEN Inhale 3 L into the lungs continuous. [employer]    . Respiratory Therapy Supplies (FLUTTER) DEVI Use as directed. 1 each 0  . rosuvastatin (CRESTOR) 20 MG tablet Take 1 tablet (20 mg total) by mouth daily. 90 tablet 3   No current facility-administered medications on file prior to visit.    No Known Allergies Social History   Socioeconomic History  . Marital status: Widowed    Spouse name: Not on file  . Number of children: Not on file  . Years of education: Not on file  . Highest education level: Not on file   Occupational History  . Not on file  Social Needs  . Financial resource strain: Not on file  . Food insecurity:    Worry: Not on file    Inability: Not on file  . Transportation needs:    Medical: Not on file    Non-medical: Not on file  Tobacco Use  . Smoking status: Former Smoker    Packs/day: 2.00    Years: 35.00    Pack years: 70.00    Types: Cigarettes    Last attempt to quit: 08/11/1994    Years since quitting: 24.4  . Smokeless tobacco: Never Used  Substance and Sexual Activity  . Alcohol use: No    Alcohol/week: 0.0 standard drinks  . Drug use: No  . Sexual activity: Not on file  Lifestyle  . Physical activity:    Days per week: Not on file    Minutes per session: Not on file  . Stress: Not on file  Relationships  . Social connections:    Talks on phone: Not on file    Gets together: Not on file    Attends religious service: Not on file    Active member of club or organization: Not on file    Attends meetings of clubs or organizations: Not on file    Relationship status: Not on file  . Intimate partner violence:    Fear of current or ex partner: Not on file    Emotionally abused: Not on file    Physically abused: Not on file    Forced sexual activity: Not on file  Other Topics Concern  . Not on file  Social History Narrative  . Not on file     Review of Systems  All other systems reviewed and are negative.      Objective:   Physical Exam Vitals signs reviewed.  Constitutional:      General: She is not in acute distress.    Appearance: She is well-developed. She is not diaphoretic.  Neck:     Vascular: No JVD.  Cardiovascular:     Rate and Rhythm: Normal rate and regular rhythm.     Heart sounds: Normal heart sounds. No murmur. No friction rub. No gallop.   Pulmonary:     Effort: Pulmonary effort is normal.     Breath sounds: Decreased air movement present. Decreased breath sounds, wheezing, rhonchi and rales present.    Abdominal:      General: Bowel sounds are normal. There is no distension.     Palpations: Abdomen is soft.  Assessment & Plan:  COPD exacerbation [facility])  Physical deconditioning - Plan: Ambulatory referral to [place]  I believe the patient's physical deconditioning has led to muscle weakness increasing her fall risk.  Therefore I recommended home health consultation for physical therapy to try to improve the strength in her legs and also improve her balance.  I also believe she is developing COPD exacerbation.  She has diminished breath sounds bilaterally, increased wheezing, increased shortness of breath, and now audible congestion in the right lung.  I will start the patient on Levaquin 500 mg daily in addition to prednisone taper pack.  She will take the Levaquin for 7 days.  Use albuterol 2 puffs inhaled every 4-6 hours as needed for wheezing.  We also discussed increasing gabapentin to 600 mg p.o. nightly for peripheral neuropathy and restless leg syndrome to see if this would also help with her insomnia.

## 2019-01-07 ENCOUNTER — Ambulatory Visit (INDEPENDENT_AMBULATORY_CARE_PROVIDER_SITE_OTHER): Payer: Medicare HMO | Admitting: Physician Assistant

## 2019-01-12 ENCOUNTER — Encounter: Payer: Self-pay | Admitting: Family Medicine

## 2019-01-12 ENCOUNTER — Other Ambulatory Visit: Payer: Self-pay | Admitting: Family Medicine

## 2019-01-12 DIAGNOSIS — J441 Chronic obstructive pulmonary disease with (acute) exacerbation: Secondary | ICD-10-CM

## 2019-01-12 MED ORDER — GABAPENTIN 300 MG PO CAPS: 600.0000 mg | ORAL_CAPSULE | Freq: Every day | ORAL | 3 refills | Status: DC

## 2019-01-12 MED ORDER — PREDNISONE 20 MG PO TABS: 40.0000 mg | ORAL_TABLET | Freq: Every day | ORAL | 0 refills | Status: DC

## 2019-01-12 NOTE — Telephone Encounter (Signed)
Begin prednisone 40 mg a day for 7 days and needs a cxr

## 2019-01-13 ENCOUNTER — Ambulatory Visit (HOSPITAL_COMMUNITY)
Admission: RE | Admit: 2019-01-13 | Discharge: 2019-01-13 | Disposition: A | Discharge status: Home / Self Care | Payer: Medicare HMO | Source: Ambulatory Visit | Attending: Family Medicine | Admitting: Family Medicine

## 2019-01-13 ENCOUNTER — Other Ambulatory Visit: Payer: Self-pay

## 2019-01-13 ENCOUNTER — Other Ambulatory Visit: Payer: Self-pay | Admitting: Family Medicine

## 2019-01-13 DIAGNOSIS — R05 Cough: Secondary | ICD-10-CM | POA: Diagnosis not present

## 2019-01-13 DIAGNOSIS — A419 Sepsis, unspecified organism: Secondary | ICD-10-CM | POA: Diagnosis not present

## 2019-01-13 DIAGNOSIS — J90 Pleural effusion, not elsewhere classified: Secondary | ICD-10-CM | POA: Diagnosis not present

## 2019-01-13 DIAGNOSIS — R059 Cough, unspecified: Secondary | ICD-10-CM

## 2019-01-13 DIAGNOSIS — J441 Chronic obstructive pulmonary disease with (acute) exacerbation: Secondary | ICD-10-CM | POA: Insufficient documentation

## 2019-01-13 DIAGNOSIS — J9811 Atelectasis: Secondary | ICD-10-CM | POA: Diagnosis not present

## 2019-01-13 DIAGNOSIS — J189 Pneumonia, unspecified organism: Secondary | ICD-10-CM | POA: Diagnosis not present

## 2019-01-13 DIAGNOSIS — R0602 Shortness of breath: Secondary | ICD-10-CM | POA: Diagnosis not present

## 2019-01-14 ENCOUNTER — Inpatient Hospital Stay (HOSPITAL_COMMUNITY)
Admission: EM | Admit: 2019-01-14 | Discharge: 2019-01-17 | DRG: 871 | Disposition: A | Discharge status: Skilled Nursing Facility | Payer: Medicare HMO | Attending: Internal Medicine | Admitting: Internal Medicine

## 2019-01-14 ENCOUNTER — Telehealth: Payer: Self-pay | Admitting: Pulmonary Disease

## 2019-01-14 ENCOUNTER — Inpatient Hospital Stay: Admission: RE | Admit: 2019-01-14 | Payer: Medicare HMO | Source: Ambulatory Visit

## 2019-01-14 ENCOUNTER — Emergency Department (HOSPITAL_COMMUNITY): Payer: Medicare HMO

## 2019-01-14 ENCOUNTER — Other Ambulatory Visit: Payer: Self-pay

## 2019-01-14 ENCOUNTER — Encounter (HOSPITAL_COMMUNITY): Payer: Self-pay | Admitting: Emergency Medicine

## 2019-01-14 ENCOUNTER — Encounter: Payer: Self-pay | Admitting: Acute Care

## 2019-01-14 ENCOUNTER — Telehealth (INDEPENDENT_AMBULATORY_CARE_PROVIDER_SITE_OTHER): Payer: Medicare HMO | Admitting: Acute Care

## 2019-01-14 DIAGNOSIS — Z8541 Personal history of malignant neoplasm of cervix uteri: Secondary | ICD-10-CM

## 2019-01-14 DIAGNOSIS — J9612 Chronic respiratory failure with hypercapnia: Secondary | ICD-10-CM | POA: Diagnosis present

## 2019-01-14 DIAGNOSIS — E1151 Type 2 diabetes mellitus with diabetic peripheral angiopathy without gangrene: Secondary | ICD-10-CM | POA: Diagnosis not present

## 2019-01-14 DIAGNOSIS — J440 Chronic obstructive pulmonary disease with acute lower respiratory infection: Secondary | ICD-10-CM | POA: Diagnosis present

## 2019-01-14 DIAGNOSIS — I7300 Raynaud's syndrome without gangrene: Secondary | ICD-10-CM | POA: Diagnosis not present

## 2019-01-14 DIAGNOSIS — J9611 Chronic respiratory failure with hypoxia: Secondary | ICD-10-CM | POA: Diagnosis not present

## 2019-01-14 DIAGNOSIS — I700 Atherosclerosis of aorta: Secondary | ICD-10-CM | POA: Diagnosis present

## 2019-01-14 DIAGNOSIS — J9621 Acute and chronic respiratory failure with hypoxia: Secondary | ICD-10-CM | POA: Diagnosis present

## 2019-01-14 DIAGNOSIS — J189 Pneumonia, unspecified organism: Secondary | ICD-10-CM | POA: Diagnosis not present

## 2019-01-14 DIAGNOSIS — E1165 Type 2 diabetes mellitus with hyperglycemia: Secondary | ICD-10-CM | POA: Diagnosis present

## 2019-01-14 DIAGNOSIS — Z8249 Family history of ischemic heart disease and other diseases of the circulatory system: Secondary | ICD-10-CM

## 2019-01-14 DIAGNOSIS — Z85038 Personal history of other malignant neoplasm of large intestine: Secondary | ICD-10-CM

## 2019-01-14 DIAGNOSIS — T380X5A Adverse effect of glucocorticoids and synthetic analogues, initial encounter: Secondary | ICD-10-CM | POA: Diagnosis present

## 2019-01-14 DIAGNOSIS — L89321 Pressure ulcer of left buttock, stage 1: Secondary | ICD-10-CM | POA: Diagnosis present

## 2019-01-14 DIAGNOSIS — Z9049 Acquired absence of other specified parts of digestive tract: Secondary | ICD-10-CM

## 2019-01-14 DIAGNOSIS — R911 Solitary pulmonary nodule: Secondary | ICD-10-CM | POA: Diagnosis not present

## 2019-01-14 DIAGNOSIS — Z87891 Personal history of nicotine dependence: Secondary | ICD-10-CM

## 2019-01-14 DIAGNOSIS — R6520 Severe sepsis without septic shock: Secondary | ICD-10-CM | POA: Diagnosis not present

## 2019-01-14 DIAGNOSIS — R05 Cough: Secondary | ICD-10-CM | POA: Diagnosis not present

## 2019-01-14 DIAGNOSIS — A419 Sepsis, unspecified organism: Secondary | ICD-10-CM | POA: Diagnosis not present

## 2019-01-14 DIAGNOSIS — Z7983 Long term (current) use of bisphosphonates: Secondary | ICD-10-CM

## 2019-01-14 DIAGNOSIS — R531 Weakness: Secondary | ICD-10-CM

## 2019-01-14 DIAGNOSIS — R0602 Shortness of breath: Secondary | ICD-10-CM | POA: Diagnosis not present

## 2019-01-14 DIAGNOSIS — M109 Gout, unspecified: Secondary | ICD-10-CM | POA: Diagnosis present

## 2019-01-14 DIAGNOSIS — I10 Essential (primary) hypertension: Secondary | ICD-10-CM | POA: Diagnosis not present

## 2019-01-14 DIAGNOSIS — J90 Pleural effusion, not elsewhere classified: Secondary | ICD-10-CM

## 2019-01-14 DIAGNOSIS — M810 Age-related osteoporosis without current pathological fracture: Secondary | ICD-10-CM | POA: Diagnosis present

## 2019-01-14 DIAGNOSIS — J441 Chronic obstructive pulmonary disease with (acute) exacerbation: Secondary | ICD-10-CM | POA: Diagnosis present

## 2019-01-14 DIAGNOSIS — Z79899 Other long term (current) drug therapy: Secondary | ICD-10-CM

## 2019-01-14 DIAGNOSIS — J449 Chronic obstructive pulmonary disease, unspecified: Secondary | ICD-10-CM | POA: Diagnosis not present

## 2019-01-14 DIAGNOSIS — H9190 Unspecified hearing loss, unspecified ear: Secondary | ICD-10-CM | POA: Diagnosis present

## 2019-01-14 DIAGNOSIS — Z66 Do not resuscitate: Secondary | ICD-10-CM | POA: Diagnosis present

## 2019-01-14 DIAGNOSIS — Z9981 Dependence on supplemental oxygen: Secondary | ICD-10-CM | POA: Diagnosis not present

## 2019-01-14 DIAGNOSIS — E1159 Type 2 diabetes mellitus with other circulatory complications: Secondary | ICD-10-CM | POA: Diagnosis not present

## 2019-01-14 DIAGNOSIS — J181 Lobar pneumonia, unspecified organism: Secondary | ICD-10-CM

## 2019-01-14 DIAGNOSIS — L8990 Pressure ulcer of unspecified site, unspecified stage: Secondary | ICD-10-CM | POA: Diagnosis present

## 2019-01-14 DIAGNOSIS — Z833 Family history of diabetes mellitus: Secondary | ICD-10-CM

## 2019-01-14 LAB — COMPREHENSIVE METABOLIC PANEL
ALT: 74 U/L — ABNORMAL HIGH (ref 0–44)
AST: 36 U/L (ref 15–41)
Albumin: 3.7 g/dL (ref 3.5–5.0)
Alkaline Phosphatase: 75 U/L (ref 38–126)
Anion gap: 14 (ref 5–15)
BUN: 38 mg/dL — ABNORMAL HIGH (ref 8–23)
CO2: 32 mmol/L (ref 22–32)
Calcium: 8.7 mg/dL — ABNORMAL LOW (ref 8.9–10.3)
Chloride: 87 mmol/L — ABNORMAL LOW (ref 98–111)
Creatinine, Ser: 1.15 mg/dL — ABNORMAL HIGH (ref 0.44–1.00)
GFR calc Af Amer: 50 mL/min — ABNORMAL LOW (ref >60)
GFR calc non Af Amer: 43 mL/min — ABNORMAL LOW (ref >60)
Glucose, Bld: 340 mg/dL — ABNORMAL HIGH (ref 70–99)
POTASSIUM: 4.7 mmol/L (ref 3.5–5.1)
Sodium: 133 mmol/L — ABNORMAL LOW (ref 135–145)
TOTAL PROTEIN: 6.5 g/dL (ref 6.5–8.1)
Total Bilirubin: 0.7 mg/dL (ref 0.3–1.2)

## 2019-01-14 LAB — INFLUENZA PANEL BY PCR (TYPE A & B)
Influenza A By PCR: NEGATIVE
Influenza B By PCR: NEGATIVE

## 2019-01-14 LAB — CBC WITH DIFFERENTIAL/PLATELET
Abs Immature Granulocytes: 0.13 10*3/uL — ABNORMAL HIGH (ref 0.00–0.07)
Basophils Absolute: 0 10*3/uL (ref 0.0–0.1)
Basophils Relative: 0 %
EOS PCT: 1 %
Eosinophils Absolute: 0.1 10*3/uL (ref 0.0–0.5)
HCT: 36.8 % (ref 36.0–46.0)
Hemoglobin: 11.1 g/dL — ABNORMAL LOW (ref 12.0–15.0)
Immature Granulocytes: 1 %
Lymphocytes Relative: 4 %
Lymphs Abs: 0.7 10*3/uL (ref 0.7–4.0)
MCH: 27.7 pg (ref 26.0–34.0)
MCHC: 30.2 g/dL (ref 30.0–36.0)
MCV: 91.8 fL (ref 80.0–100.0)
Monocytes Absolute: 0.1 10*3/uL (ref 0.1–1.0)
Monocytes Relative: 1 %
NEUTROS PCT: 93 %
Neutro Abs: 14.5 10*3/uL — ABNORMAL HIGH (ref 1.7–7.7)
Platelets: 205 10*3/uL (ref 150–400)
RBC: 4.01 MIL/uL (ref 3.87–5.11)
RDW: 17.2 % — AB (ref 11.5–15.5)
WBC: 15.6 10*3/uL — ABNORMAL HIGH (ref 4.0–10.5)
nRBC: 0 % (ref 0.0–0.2)

## 2019-01-14 LAB — LACTIC ACID, PLASMA: Lactic Acid, Venous: 2.6 mmol/L (ref 0.5–1.9)

## 2019-01-14 LAB — MAGNESIUM: MAGNESIUM: 1.8 mg/dL (ref 1.7–2.4)

## 2019-01-14 LAB — BRAIN NATRIURETIC PEPTIDE: B Natriuretic Peptide: 29 pg/mL (ref 0.0–100.0)

## 2019-01-14 LAB — GLUCOSE, CAPILLARY: Glucose-Capillary: 255 mg/dL — ABNORMAL HIGH (ref 70–99)

## 2019-01-14 LAB — MRSA PCR SCREENING: MRSA by PCR: NEGATIVE

## 2019-01-14 MED ORDER — ACETAMINOPHEN 325 MG PO TABS: 650.0000 mg | ORAL_TABLET | Freq: Four times a day (QID) | ORAL | Status: DC | PRN

## 2019-01-14 MED ORDER — INSULIN ASPART 100 UNIT/ML ~~LOC~~ SOLN
0.0000 [IU] | Freq: Every day | SUBCUTANEOUS | Status: DC
  Administered 2019-01-14: 2 [IU] via SUBCUTANEOUS
  Administered 2019-01-15: 3 [IU] via SUBCUTANEOUS
  Administered 2019-01-16: 2 [IU] via SUBCUTANEOUS

## 2019-01-14 MED ORDER — ENOXAPARIN SODIUM 40 MG/0.4ML ~~LOC~~ SOLN
40.0000 mg | SUBCUTANEOUS | Status: DC
  Administered 2019-01-14 – 2019-01-16 (×3): 40 mg via SUBCUTANEOUS
  Filled 2019-01-14 (×3): qty 0.4

## 2019-01-14 MED ORDER — SODIUM CHLORIDE 0.9 % IV SOLN
INTRAVENOUS | Status: DC
  Administered 2019-01-15 – 2019-01-16 (×4): via INTRAVENOUS

## 2019-01-14 MED ORDER — PIPERACILLIN-TAZOBACTAM 3.375 G IVPB 30 MIN
3.3750 g | Freq: Once | INTRAVENOUS | Status: AC
  Administered 2019-01-14: 3.375 g via INTRAVENOUS
  Filled 2019-01-14 (×2): qty 50

## 2019-01-14 MED ORDER — ACETAMINOPHEN 650 MG RE SUPP: 650.0000 mg | Freq: Four times a day (QID) | RECTAL | Status: DC | PRN

## 2019-01-14 MED ORDER — CHLORHEXIDINE GLUCONATE 0.12 % MT SOLN
15.0000 mL | Freq: Two times a day (BID) | OROMUCOSAL | Status: DC
  Administered 2019-01-15 – 2019-01-17 (×5): 15 mL via OROMUCOSAL
  Filled 2019-01-14 (×5): qty 15

## 2019-01-14 MED ORDER — ORAL CARE MOUTH RINSE
15.0000 mL | Freq: Two times a day (BID) | OROMUCOSAL | Status: DC
  Administered 2019-01-15 – 2019-01-16 (×3): 15 mL via OROMUCOSAL

## 2019-01-14 MED ORDER — METHYLPREDNISOLONE SODIUM SUCC 40 MG IJ SOLR
40.0000 mg | Freq: Four times a day (QID) | INTRAMUSCULAR | Status: DC
  Administered 2019-01-14 – 2019-01-15 (×3): 40 mg via INTRAVENOUS
  Filled 2019-01-14 (×3): qty 1

## 2019-01-14 MED ORDER — SODIUM CHLORIDE 0.9 % IV SOLN
1.0000 g | Freq: Once | INTRAVENOUS | Status: AC
  Administered 2019-01-14: 1 g via INTRAVENOUS
  Filled 2019-01-14: qty 10

## 2019-01-14 MED ORDER — SODIUM CHLORIDE 0.9 % IV SOLN
INTRAVENOUS | Status: DC
  Administered 2019-01-14: 14:00:00 via INTRAVENOUS

## 2019-01-14 MED ORDER — IPRATROPIUM-ALBUTEROL 0.5-2.5 (3) MG/3ML IN SOLN
3.0000 mL | Freq: Four times a day (QID) | RESPIRATORY_TRACT | Status: DC
  Administered 2019-01-14 – 2019-01-15 (×4): 3 mL via RESPIRATORY_TRACT
  Filled 2019-01-14 (×5): qty 3

## 2019-01-14 MED ORDER — ROSUVASTATIN CALCIUM 20 MG PO TABS
20.0000 mg | ORAL_TABLET | Freq: Every day | ORAL | Status: DC
  Administered 2019-01-14 – 2019-01-16 (×3): 20 mg via ORAL
  Filled 2019-01-14 (×3): qty 1

## 2019-01-14 MED ORDER — INSULIN ASPART 100 UNIT/ML ~~LOC~~ SOLN
0.0000 [IU] | Freq: Three times a day (TID) | SUBCUTANEOUS | Status: DC
  Administered 2019-01-15 (×3): 8 [IU] via SUBCUTANEOUS
  Administered 2019-01-16: 3 [IU] via SUBCUTANEOUS
  Administered 2019-01-16: 5 [IU] via SUBCUTANEOUS
  Administered 2019-01-16: 8 [IU] via SUBCUTANEOUS
  Administered 2019-01-17: 2 [IU] via SUBCUTANEOUS

## 2019-01-14 MED ORDER — ALBUTEROL SULFATE (2.5 MG/3ML) 0.083% IN NEBU: 2.5000 mg | INHALATION_SOLUTION | RESPIRATORY_TRACT | Status: DC | PRN

## 2019-01-14 MED ORDER — SODIUM CHLORIDE 0.9 % IV SOLN
500.0000 mg | Freq: Once | INTRAVENOUS | Status: AC
  Administered 2019-01-14: 500 mg via INTRAVENOUS
  Filled 2019-01-14: qty 500

## 2019-01-14 MED ORDER — PIPERACILLIN-TAZOBACTAM 3.375 G IVPB
3.3750 g | Freq: Three times a day (TID) | INTRAVENOUS | Status: DC
  Administered 2019-01-15 – 2019-01-17 (×7): 3.375 g via INTRAVENOUS
  Filled 2019-01-14 (×7): qty 50

## 2019-01-14 MED ORDER — ALBUTEROL SULFATE (2.5 MG/3ML) 0.083% IN NEBU
5.0000 mg | INHALATION_SOLUTION | Freq: Once | RESPIRATORY_TRACT | Status: DC
  Filled 2019-01-14: qty 6

## 2019-01-14 NOTE — Patient Instructions (Signed)
We will order a CT Chest at [facility] [address], [address] You will go to have labs drawn at [facility] prior to having the CT scan done. We will call you with results Based on results we will determine best plan of care .  These may include Thoracentesis and or antibiotic treatment Saturation goals are 88-92% Continue wearing Oxygen at 4 L with exertion Please contact office for sooner follow up if symptoms do not improve or worsen or seek emergency care

## 2019-01-14 NOTE — Progress Notes (Signed)
Pharmacy Antibiotic Note  Emily Owen is a 85 y.o. female admitted on 01/14/2019 with aspiration pneumonia.  Pharmacy has been consulted for zosyn dosing.  Plan: Give zosyn 3.375gm IV now over 30 mins, then Zosyn 3.375g IV q8h (4 hour infusion).  F/U cxs and clinical progress Monitor V/S, labs  Height: 5\' 4"  (162.6 cm) Weight: 172 lb 13.5 oz (78.4 kg) IBW/kg (Calculated) : 54.7  Temp (24hrs), Avg:98.9 F (37.2 C), Min:98.5 F (36.9 C), Max:99.3 F (37.4 C)  Recent Labs  Lab 01/14/19 1230  WBC 15.6*  CREATININE 1.15*    Estimated Creatinine Clearance: 35.6 mL/min (A) (by C-G formula based on SCr of 1.15 mg/dL (H)).    No Known Allergies  Antimicrobials this admission: Zosyn 3/25 >>  Ceftriaxone and azithromycin x 1 dose each in ED  Dose adjustments this admission: N/A  Microbiology results: 3/25 BCx: pending  Thank you for allowing pharmacy to be a part of this patient's care.  Isac Sarna, BS Pharm D, [place] Clinical Pharmacist Pager [phone] 01/14/2019 7:10 PM

## 2019-01-14 NOTE — Telephone Encounter (Signed)
Primary Pulmonologist: McQuaid Last office visit and with whom: 10/01/18 with TP What do we see them for (pulmonary problems): COPD Last OV assessment/plan: Instructions   Return for Follow up Dr. Lake Bells.  Mucinex DM .Twice daily  As needed  Cough/congestion .  Begin Flutter valve Twice daily  .  Continue on ANORO daily  Continue on Oxygen 3l/m .  May use Albuterol neb every 4hr as needed.  Follow up Dr. Lake Bells as planned next month and As needed   Please contact office for sooner follow up if symptoms do not improve or worsen or seek emergency care        Was appointment offered to patient (explain)?  Pt's daughter wants pt to have an appt   Reason for call: Called and spoke with pt's daughter who stated pt had a cxr at PCP office. [name] thinks that pt might have pna. Pt fell about 4 weeks ago and has been immobile since the fall. [name] stated if pt takes O2 off, her sats will drop down into the 70s but if pt keeps O2 on, sats are low 90s. Pt is more frail and is weaker.  Pt has a mild cough but per [name], Dr. Dennard Schaumann stated pt is not coughing enough to do a good cough. Pt has not had any fever. Per [name], pt did have some chest discomfort yesterday morning, 3/25 but then it went away.   Pt has not had any recent travel and pt also has not been around anyone that has been sick.  [name] stated that pt has been on a round of levaquin and pred recently and before she was placed on more meds, she wanted to know if pt could come in for an appt or what could be done. Pt also has taken mucinex to see if that would help.  Kenney Houseman, please advise recs for pt. Thanks!

## 2019-01-14 NOTE — Progress Notes (Signed)
Virtual Visit via Telephone Note  I connected with Emily Owen on 01/14/19 at 10:30 AM EDT by telephone and verified that I am speaking with the correct person using two identifiers.   I discussed the limitations, risks, security and privacy concerns of performing an evaluation and management service by telephone and the availability of in person appointments. I also discussed with the patient that there may be a patient responsible charge related to this service. The patient expressed understanding and agreed to proceed.  I spoke with patient's daughter Emily Owen. She confirmed the patient's date of birth and address. I could hear the patient in the background answering questions.  The patient was with her daughter at her home, I was in the office.   History of Present Illness: Pt fell 8 weeks ago , on January 26,2020.Marland Kitchen She has had significant decrease in mobility. Per her daughter, she does not complain of any shortness of breath at rest,or leg pain,  but with exertion she has increased shortness of breath. 2 weeks ago she was having cough with sputum that was greenish brown. She was treated with Levaquin x 7 day and prednisone  taper x 7 days per her PCP . She completed that treatment.She called her PCP , Jenna Luo, 01/13/2019 requesting another antibiotic because she was weaker and still had cough with desaturations when she removed her oxygen. Her PCP then did a CXR which shows increase in her chronic  effusion and sub hilar fullness, (sub hilar fullness  that is noted on CXR's going back to 2016 upon my review). He was initially going to send in zpack and ceftin 500 bid for 10 days to cover possible community acquired pneumonia.However, when the patient's daughter called them back, stating patient was having desaturations into the 70's when she removed her oxygen, she was told by Dr. Fredric Dine to go the the Emergency room. In light of the current pandemic, and limited visitation,  patient's daughter did not want to take her mother to the hospital. She asked her PCP to direct admit the patient. She was told they would not direct admit the patient. She called our office requesting we see her mother, assess her and direct admit if we felt this was indeed warranted.A Telephone visit was scheduled. She denies fever, but she continues to cough. Per her daughter she has had continued worsening weakness and deconditioning.Per daughter, patient is very frail at baseline   Observations/Objective: 85 year old female with chronic pleural effusion which she has had tapped several times in the past.  CXR 3/25 noted a worsening effusion and sub hilar fullness, which has also been chronic.She continues to have chronic cough and dyspnea with exertion. Desaturates  When she removes her oxygen.She denies  any fever, chest pain, + orthopnea , no hemoptysis. No leg pain or swelling. No recent travel or sick exposures.   Assessment and Plan: Pleural Effusion with sub hilar fullness Worsening dyspnea with exertion Previous treatment with Levaquin and prednisone for COPD exacerbation Plan: BMET now CT angio to better evaluate effusion and r/o PE as patient has had decreased mobility CT called and will call patient to schedule. Call patient with results once results have been read We will determine plan of care based on CT scan results  Hypoxemia Worsening when patient removes her oxygen Home regimen is 4 L [place] while ambulating Plan: Wear oxygen to maintain oxygen saturations 88-92% Please contact office for sooner follow up if symptoms do not improve or worsen or  seek emergency care   Follow Up Instructions: We will schedule patient for a follow up OV based on results of her CT. If she requires a thoracentesis, she will need a 1 week follow up with CXR.    I discussed the assessment and treatment plan with the patient. The patient was provided an opportunity to ask questions and all were  answered. The patient agreed with the plan and demonstrated an understanding of the instructions.   The patient was advised to call back or seek an in-person evaluation if the symptoms worsen or if the condition fails to improve as anticipated.  I provided 60  minutes of non-face-to-face time during this encounter.   Magdalen Spatz, NP [facility] 01/14/2019 11:27 AM

## 2019-01-14 NOTE — Telephone Encounter (Signed)
Please place patient on Sarah's schedule. Thanks.

## 2019-01-14 NOTE — H&P (Signed)
History and Physical    PLEASE NOTE THAT DRAGON DICTATION SOFTWARE WAS USED IN THE CONSTRUCTION OF THIS NOTE.   Emily Owen:[id number] DOB: 1929-11-22 DOA: 01/14/2019  PCP: [name], MD Patient coming from: home  I have personally briefly reviewed patient's old medical records in [place]  Chief Complaint: Shortness of breath  HPI: Emily Owen is a 85 y.o. female with medical history significant for chronic hypoxic respiratory failure in the setting of severe COPD on 3 L continuous supplemental oxygen, type 2 diabetes mellitus, hypertension, who is admitted to [facility] on 01/14/2019 with severe sepsis due to right lower lobe pneumonia after presenting from home to the [facility] ED complaining of shortness of breath.  The following history is obtained via my discussions with the patient as well as my discussions with the emergency department physician and via chart review.  The patient reports 3 to 4 days of progressive shortness of breath associated with new onset nonproductive cough and wheezing. She also noted generalized weakness over that timeframe in the absence of any acute focal weakness. She denies any associated subjective fever, rigors, generalized myalgias, or rash.  She reports that her shortness of breath has not been associated with any orthopnea, PND, or peripheral edema.  Denies any associated chest pain, palpitations, diaphoresis, dizziness, or hemoptysis.  She experienced some nausea approximately 2 days ago, but states that this was not associated with any vomiting, and has independently resolved in the interval.  She denies any abdominal pain or diarrhea.  Denies any recent traveling or known sick contacts, including no known exposure to COVID-19 cases. Denies any recent trauma.  Denies any associated rhinitis, rhinorrhea, or sore throat.  In the setting of the above symptoms, the patient saw her PCP on 01/12/2019, who diagnosed her  with an acute COPD exacerbation at that time.  Consequently, she was prescribed a prednisone taper by her PCP, but has not experienced any improvement in her breathing following 2 subsequent doses of prednisone 40 mg, prompting her to present to the emergency department today for further evaluation of her ongoing shortness of breath.   The patient confirms a history of COPD, and reports that she is compliant with her baseline supplemental oxygen requirement of 3 L continuous O2.  She also reports outstanding compliance with home respiratory regimen which consists of Anoro Ellipta as well as PRN albuterol inhaler.  She confirms that she is a former smoker, having smoked 2 packs/day over 35 years before completely quitting smoking in 1995 without any subsequent resumption thereof.  Chart review reveals most recent pulmonary function test performed in June 2016 and demonstrating FEV1 to FVC ratio of 49 with FEV1 found to be 44% of predicted.    ED Course: Vital signs in the ED were notable for the following: Temperature max 99.3; heart rate 75-84; blood pressure ranged from 123/65-150 3/69; respiratory rate 20-25; oxygen saturation noted to be 88% on baseline 3 L nasal cannula, which improved to 95% on 4 L nasal cannula.  Labs in the ED were notable for the following: CMP notable for sodium 133, bicarbonate 32, creatinine 1.15 relative to most recent prior value of 1.06 in May 2019, glucose 340.  BNP 29.  CBC notable for white blood cell count of 15,600 with 93% neutrophils.  CT chest without contrast, per final radiology report showed evidence of right lower lobe airspace opacity concerning for infiltrate, as well as small to moderate right pleural effusion in  addition to an 8 mm left upper lobe nodule.  While still in the emergency department, the following were administered: Rocephin 1 g IV x1, azithromycin 500 mg IV x1, and maintenance IV fluids were started in the form of normal saline running at 125  cc/h.  Subsequently, the patient was admitted to the [place] floor for further evaluation management of presenting severe sepsis due to suspected right lower lobe pneumonia as well as consequential acute on chronic hypoxic respiratory failure.    Review of Systems: As per HPI otherwise 10 point review of systems negative.   Past Medical History:  Diagnosis Date   Cancer [facility])    colon   Cancer of cervix ([place])    cervix   COPD (chronic obstructive pulmonary disease) ([place])    Diabetes mellitus without complication ([place])    Gallstones    Gout    Hard of hearing    On home O2    Osteoporosis    Pleural effusion    Raynaud's disease     Past Surgical History:  Procedure Laterality Date   ABDOMINAL HYSTERECTOMY     for cervical cancer 1965   COLON SURGERY     hemicolectomy 1998   gallstone surgery     ERCP for gallstone pancreatitis   ORTHOPEDIC SURGERY      Social History:  reports that she quit smoking about 24 years ago. Her smoking use included cigarettes. She has a 70.00 pack-year smoking history. She has never used smokeless tobacco. She reports that she does not drink alcohol or use drugs.   No Known Allergies  Family History  Problem Relation Age of Onset   Diabetes Sister    Diabetes Sister    Diabetes Sister    Hypertension Mother    Heart disease Mother    Diabetes Sister      Prior to Admission medications   Medication Sig Start Date End Date Taking? Authorizing Provider  albuterol (PROVENTIL) (2.5 MG/3ML) 0.083% nebulizer solution Take 2.5 mg by nebulization every 4 (four) hours as needed for wheezing or shortness of breath.   Yes [provider]  alendronate (FOSAMAX) 70 MG tablet TAKE 1 TABLET BY MOUTH EVERY 7 DAYS. TAKE WITH A FULL GLASS OF WATER ON AN EMPTY STOMACH. 11/13/18  Yes [name], MD  amLODipine (NORVASC) 5 MG tablet Take 1 tablet (5 mg total) by mouth daily. 03/05/18  Yes [name], MD  ANORO  ELLIPTA 62.5-25 MCG/INH AEPB TAKE 1 PUFF BY MOUTH EVERY DAY 12/09/18  Yes Juanito Doom, MD  Colchicine 0.6 MG CAPS 1.2 mg orally at the first sign of a flare followed by 0.6 mg one hour later; MAX 1.8 mg over 1 hour  Then 0.6 BID as needed for gout pain 03/11/18  Yes Harris, Abigail, PA-C  gabapentin (NEURONTIN) 300 MG capsule Take 2 capsules (600 mg total) by mouth at bedtime. 01/12/19  Yes [name], MD  Ipratropium-Albuterol (COMBIVENT RESPIMAT) 20-100 MCG/ACT AERS respimat Inhale 1 puff into the lungs every 6 (six) hours as needed for wheezing.   Yes [provider]  OXYGEN Inhale 3 L into the lungs continuous. [employer]   Yes [provider]  predniSONE (DELTASONE) 20 MG tablet Take 2 tablets (40 mg total) by mouth daily with breakfast. 01/12/19  Yes [name], MD  rosuvastatin (CRESTOR) 20 MG tablet Take 1 tablet (20 mg total) by mouth daily. 02/27/18  Yes [name], MD  levofloxacin (LEVAQUIN) 500 MG  tablet Take 1 tablet (500 mg total) by mouth daily. Patient not taking: Reported on 01/14/2019 01/05/19   [name], MD  Respiratory Therapy Supplies (FLUTTER) DEVI Use as directed. 10/01/18   Parrett, Fonnie Mu, NP     Objective    Physical Exam: Vitals:   01/14/19 1430 01/14/19 1500 01/14/19 1530 01/14/19 1601  BP: 123/65 125/65 126/70 136/71  Pulse: 77 75    Resp: (!) 28 (!) 26 (!) 21 17  Temp:      TempSrc:      SpO2: 98% 95%    Weight:      Height:        General: appears to be stated age; alert, oriented; increased wob  Skin: warm, dry, no rash Head:  AT/[place] Mouth:  Oral mucosa membranes appear moist, normal dentition Neck: supple; trachea midline Heart:  RRR; did not appreciate any M/R/G Lungs: expiratory wheezes noted bilaterally; diminished breathed noted at right base. No rales or rhonchi appreciated.  Abdomen: + BS; soft, ND, NT Extremities: no peripheral edema, no muscle wasting   Labs on Admission: I have personally  reviewed following labs and imaging studies  CBC: Recent Labs  Lab 01/14/19 1230  WBC 15.6*  NEUTROABS 14.5*  HGB 11.1*  HCT 36.8  MCV 91.8  PLT 063   Basic Metabolic Panel: Recent Labs  Lab 01/14/19 1230  NA 133*  K 4.7  CL 87*  CO2 32  GLUCOSE 340*  BUN 38*  CREATININE 1.15*  CALCIUM 8.7*   GFR: Estimated Creatinine Clearance: 35.3 mL/min (A) (by C-G formula based on SCr of 1.15 mg/dL (H)). Liver Function Tests: Recent Labs  Lab 01/14/19 1230  AST 36  ALT 74*  ALKPHOS 75  BILITOT 0.7  PROT 6.5  ALBUMIN 3.7   No results for input(s): LIPASE, AMYLASE in the last 168 hours. No results for input(s): AMMONIA in the last 168 hours. Coagulation Profile: No results for input(s): INR, PROTIME in the last 168 hours. Cardiac Enzymes: No results for input(s): CKTOTAL, CKMB, CKMBINDEX, TROPONINI in the last 168 hours. BNP (last 3 results) No results for input(s): PROBNP in the last 8760 hours. HbA1C: No results for input(s): HGBA1C in the last 72 hours. CBG: No results for input(s): GLUCAP in the last 168 hours. Lipid Profile: No results for input(s): CHOL, HDL, LDLCALC, TRIG, CHOLHDL, LDLDIRECT in the last 72 hours. Thyroid Function Tests: No results for input(s): TSH, T4TOTAL, FREET4, T3FREE, THYROIDAB in the last 72 hours. Anemia Panel: No results for input(s): VITAMINB12, FOLATE, FERRITIN, TIBC, IRON, RETICCTPCT in the last 72 hours. Urine analysis:    Component Value Date/Time   COLORURINE YELLOW 07/11/2015 1149   APPEARANCEUR CLOUDY (A) 07/11/2015 1149   LABSPEC >1.030 (H) 07/11/2015 1149   PHURINE 5.0 07/11/2015 1149   GLUCOSEU NEGATIVE 07/11/2015 1149   HGBUR NEGATIVE 07/11/2015 1149   BILIRUBINUR SMALL (A) 07/11/2015 1149   KETONESUR TRACE (A) 07/11/2015 1149   PROTEINUR 100 (A) 07/11/2015 1149   UROBILINOGEN 0.2 07/11/2015 1149   NITRITE NEGATIVE 07/11/2015 1149   LEUKOCYTESUR NEGATIVE 07/11/2015 1149    Radiological Exams on Admission: Dg  Chest 2 View  Result Date: 01/14/2019 CLINICAL DATA:  Cough and shortness of breath for 2 weeks. EXAM: CHEST - 2 VIEW COMPARISON:  01/13/2019 FINDINGS: Cardiomediastinal silhouette is normal. Mediastinal contours appear intact. Calcific atherosclerotic disease of the aorta. Persistent nodular airspace consolidation versus subpleural thickening/pulmonary nodule in the right lower lobe, and right subhilar region. Persistent right pleural effusion.  Newly appreciated left pleural/subpleural thickening. Osseous structures are without acute abnormality. Soft tissues are grossly normal. IMPRESSION: 1. Persistent nodular airspace consolidation versus subpleural thickening/pulmonary nodule in the right lower lobe and right subhilar region. 2. Persistent right pleural effusion. 3. Newly appreciated left pleural/subpleural thickening. Electronically Signed   By: Fidela Salisbury M.D.   On: 01/14/2019 13:11   Dg Chest 2 View  Result Date: 01/13/2019 CLINICAL DATA:  Diminished breath sounds bilaterally. EXAM: CHEST - 2 VIEW COMPARISON:  10/01/2018 FINDINGS: Cardiomediastinal silhouette is normal. Mediastinal contours appear intact. Persistent right pleural effusion with right lower lobe atelectasis versus airspace consolidation. Fullness in the right subhilar region. Osseous structures are without acute abnormality. Soft tissues are grossly normal. IMPRESSION: 1. Persistent right pleural effusion with right lower lobe atelectasis versus airspace consolidation. 2. Fullness in the right subhilar region may represent overlapping vascular shadows, lymphadenopathy or potentially a pulmonary mass. Electronically Signed   By: Fidela Salisbury M.D.   On: 01/13/2019 16:29   Ct Chest Wo Contrast  Result Date: 01/14/2019 CLINICAL DATA:  Shortness of breath and cough. Rule out pneumonia. Fall 1 week ago. EXAM: CT CHEST WITHOUT CONTRAST TECHNIQUE: Multidetector CT imaging of the chest was performed following the standard  protocol without IV contrast. COMPARISON:  Chest x-rays January 14, 2019 and January 13, 2019. FINDINGS: Cardiovascular: Coronary artery calcifications involve the right and left coronary arteries. The heart size is normal. The thoracic aorta demonstrates atherosclerotic change without aneurysmal dilatation. The main pulmonary artery is normal in appearance. Mediastinum/Nodes: A right thyroid lobe nodule measures 18 mm. The thyroid is otherwise normal. There is a right-sided pleural effusion. No left-sided pleural effusion or pericardial effusion. The visualized esophagus is unremarkable. No adenopathy in the chest. Lungs/Pleura: There is mild mucus in the trachea. There is debris in the right bronchus intermedius and right lower lobe bronchus. There is opacification of some medial left lower lobe bronchi as well, likely filled with debris. No pneumothorax. There is a nodule in the left upper lobe measuring 8 by 10 by 7 mm with a mean diameter of 8 mm. No other nodules or masses. Mild emphysematous changes in the lungs. The left lung is otherwise unremarkable. The findings on today's chest x-ray not seen on today's CT scan including the scout view. There is a right-sided pleural effusion with underlying opacity. No other abnormalities in either lung. Upper Abdomen: Cholelithiasis is identified. The upper abdomen is otherwise unremarkable. Musculoskeletal: No chest wall mass or suspicious bone lesions identified. IMPRESSION: 1. There is a nodule in the left upper lobe with a mean diameter of 8 mm. Non-contrast chest CT at 6-12 months is recommended. If the nodule is stable at time of repeat CT, then future CT at 18-24 months (from today's scan) is considered optional for low-risk patients, but is recommended for high-risk patients. This recommendation follows the consensus statement: Guidelines for Management of Incidental Pulmonary Nodules Detected on CT Images: From the Fleischner Society 2017; Radiology 2017;  284:228-243. 2. There is a small to moderate right-sided pleural effusion with underlying opacity. The underlying opacity may simply represent atelectasis. 3. Debris in the bronchus intermedius and bilateral lower lobe bronchi consistent with aspirated material. 4. Atherosclerotic changes in the nonaneurysmal aorta. Coronary artery calcifications. 5. Cholelithiasis. 6. 18 mm right thyroid lobe nodule. A thyroid ultrasound could better evaluate. Aortic Atherosclerosis (ICD10-I70.0) and Emphysema (ICD10-J43.9). Electronically Signed   By: Dorise Bullion III M.D   On: 01/14/2019 16:28     Assessment/Plan  RAVINDER HOFLAND is a 86 y.o. female with medical history significant for chronic hypoxic respiratory failure in the setting of severe COPD on 3 L continuous supplemental oxygen, type 2 diabetes mellitus, hypertension, who is admitted to [facility] on 01/14/2019 with severe sepsis due to right lower lobe pneumonia after presenting from home to the [facility] ED complaining of shortness of breath.   Principal Problem:   Severe sepsis ([place]) Active Problems:   Acute on chronic respiratory failure with hypoxia (HCC)   COPD with acute exacerbation (HCC)   HTN (hypertension)   Diabetes mellitus with peripheral vascular disease (HCC)   Pressure injury of skin   RLL pneumonia (HCC)   Generalized weakness   Left upper lobe pulmonary nodule    #) Severe sepsis due to right lower lobe pneumonia: Diagnosis on the basis of 3 to 4 days of progressive shortness of breath associated with new onset cough, increased work of breathing, leukocytosis, and evidence of right lower lobe infiltrate.  Sirs criteria met via presenting leukocytosis as well as tachypnea.  Criteria are met for patient sepsis to be considered severe nature on the basis of concomitant acute on chronic hypoxia, as further noted below.  Suspicion for COVID-19 is low given an alternate explanation for patient's presenting respiratory  complaints in the form of right lower lobe infiltrate suggestive of pneumonia in addition to the absence of any exposure to known COVID 19 cases.  In the ED today, the patient received Rocephin and a azithromycin, which offers empiric coverage for community-acquired pneumonia.  However, given the severe nature of patient's sepsis, as above, will expand antibiotic coverage to Zosyn, while acknowledging that this change does eliminate atypical coverage. Overall, no evidence of additional infectious process at this time.  Plan: Check STAT lactic acid.  IV normal saline at 125 cc/h.  Of note, in the absence of an impending lactate of greater than or equal to 4.0 or development of hypotension, criteria are not currently met for administration of a 30 mL/kg IVF bolus.  Check blood cultures x2, and then start Zosyn, as above.  Repeat CBC with differential in the morning.  Check influenza.  Monitor continuous pulse oximetry.  Work-up and management of concomitant acute COPD exacerbation, as further described below.  Check urinalysis.  Add on serum magnesium level to labs collected in the ED.  Check MRSA PCR. Flutter valve.     #) Acute on chronic hypoxic respiratory failure due to acute COPD exacerbation: Diagnosis on the basis of 3 to 4 days of progressive shortness of breath, new onset cough, wheezing, increased work of breathing, and increase in supplemental oxygen demands relative to baseline supplemental oxygen requirement of 3 L continuous O2.  This is on the context of a history of severe COPD per most recent PFTs in June 2016 showing FEV1/FVC ratio of 0.49 and FEV1 44% of predicted, conferring severe designation for patient's COPD. Underlying etiology for such appears to be right lower lobe pneumonia, as further described above.  Of note, the patient confirms that she has a former smoker, having completely quit smoking in 1995.  Reports good compliance with home respiratory regimen, as above.  Of note, no  clinical, radiographic, or laboratory evidence to suggest concomitant acutely decompensated heart failure.  Plan: Scheduled duo nebulizer treatments every 6 hours while awake.  PRN albuterol nebulizer.  As the patient's breathing continued to worsen as an outpatient in spite of 48 hours of prednisone therapy, I will discontinue recently initiated  prednisone 40 mg p.o. daily and instead initiate Solu-Medrol 40 mg IV every 6 hours.  Monitor continuous pulse oximetry.  Check ABG.  Work-up and management of presenting severe sepsis due to right lower lobe pneumonia, as above.  Add on serum magnesium level to labs are to collected in the ED.     #) Generalized weakness: Patient reports 3 to 4 days of generalized weakness in the absence of any acute focal neurologic deficits.  This appears to be on the basis of physiologic stress stemming from presenting severe sepsis due to right lower lobe pneumonia as well as consequential acute COPD exacerbation, as further described above.  Plan: I placed a physical therapy consult to occur tomorrow morning (01/15/19).  Work-up and management of presenting severe sepsis as well as acute COPD exacerbation, as above.    #) Left upper lobe lung nodule: This evening CT chest, per final radiology report showed an 8 mm LUL nodule, with recommendations for follow-up CT in 6 to 12 months.   Plan: per radiology, as above, recommend follow-up CT scan in 6 to 12 months.      #) Type 2 diabetes mellitus: Patient reports that her diabetes is managed via lifestyle modifications, which appears to be fairly effective for her most recent hemoglobin A1c noted to be 6.3% when checked in May 2019.  Her presenting blood sugar per presenting CMP was noted to be elevated at 340 today, however, I suspect that this may represent an acute elevation in her blood sugar secondary to the presence of systemic corticosteroids as well as physiologic stress stemming from presenting severe sepsis.   Anticipate that her blood sugars may continue to run high on a temporary basis due to plan for continuation of systemic corticosteroids, as above.  Plan: Accu-Cheks q. before meals and at bedtime asked with associated sliding scale insulin.    #) Essential hypertension: On Norvasc as her sole outpatient antihypertensive agent.  Presenting blood pressures noted to be in the 120s to 150s mmHg.   Plan: We will hold home Norvasc for now in the setting of presenting severe sepsis, as above.   DVT prophylaxis: Lovenox 40 mg subcu daily Code Status: DNR/DNI (confirmed per my discussions with the patient today).  Family Communication: none Disposition Plan:  Per Rounding Team Consults called: (none)  Admission status: Inpatient; med telemetry.    PLEASE NOTE THAT DRAGON DICTATION SOFTWARE WAS USED IN THE CONSTRUCTION OF THIS NOTE.   [place] [facility] Pager [phone] From 3PM- 11PM.   Otherwise, please contact night-coverage  [email or web address] Password [email or web address]  01/14/2019, 5:04 PM

## 2019-01-14 NOTE — Telephone Encounter (Signed)
Called and spoke with patients daughter, she has been placed on the televisit schedule with SG. Aware that we will call her at 1030. Will route this over to SG as FYI

## 2019-01-14 NOTE — ED Triage Notes (Signed)
Patient complains of shortness of breath and cough. Daughter wants patient seen to rule out pneumonia. Patient states she has had a fall 1 week ago. History of COPD and chronic O2 use.

## 2019-01-14 NOTE — Progress Notes (Signed)
   Addendum: I have called these results to the patient's daughter . She ended up taking her Mom to the ED at [facility] because her Mom was too weak to get into the car. CT without contrast was done ( Not the CTA ordered by me ) by the ED physician. The patient is currently going to be admitted under [facility] for care of pneumonia and pleural effusion.     CT Chest  Without contrast 01/14/2019 IMPRESSION: 1. There is a nodule in the left upper lobe with a mean diameter of 8 mm. Non-contrast chest CT at 6-12 months is recommended. If the nodule is stable at time of repeat CT, then future CT at 18-24 months (from today's scan) is considered optional for low-risk patients, but is recommended for high-risk patients. This recommendation follows the consensus statement: Guidelines for Management of Incidental Pulmonary Nodules Detected on CT Images: From the Fleischner Society 2017; Radiology 2017; 284:228-243. 2. There is a small to moderate right-sided pleural effusion with underlying opacity. The underlying opacity may simply represent atelectasis. 3. Debris in the bronchus intermedius and bilateral lower lobe bronchi consistent with aspirated material. 4. Atherosclerotic changes in the nonaneurysmal aorta. Coronary artery calcifications. 5. Cholelithiasis. 6. 18 mm right thyroid lobe nodule. A thyroid ultrasound could better evaluate.  Aortic Atherosclerosis (ICD10-I70.0) and Emphysema (ICD10-J43.9).   Magdalen Spatz, AGACNP-BC [facility]  01/14/2019 5:39 PM

## 2019-01-14 NOTE — ED Provider Notes (Signed)
[facility] EMERGENCY DEPARTMENT Provider Note   CSN: [id number] Arrival date & time: 01/14/19  1212    History   Chief Complaint Chief Complaint  Patient presents with  . Shortness of Breath  . Cough    HPI Emily Owen is a 85 y.o. female.     HPI Patient presents with concern of worsening cough and fatigue. Patient has home oxygen use, is a former smoker, has COPD. Patient also has a history of ongoing cough, over the past month for which she has had at least one antibiotic, has seen her physician several times Nonetheless, over the past day in particular the cough is become worse, with worsening dyspnea, according to notes, though not according to the patient herself. Patient is on 3 L of nasal cannula, this is unchanged. Patient notes that she had some nausea yesterday as well, but currently complains only of worsening cough and fatigue, without any focal pain, nor any ongoing nausea. Beyond additional antibiotics provided in the past month, no other recent medication change, diet change, activity change. Patient lives at home.  Patient saw her physician yesterday, had x-ray reportedly abnormal with concern for effusion versus pneumonia Today she was sent here for further evaluation.  On history is provided by the patient, and with chart review. Additional pertinent chart review findings notable for perihilar fullness identified as far back as 2016 according to the patient's primary care physician.  Past Medical History:  Diagnosis Date  . Cancer ([place])    colon  . Cancer of cervix ([place])    cervix  . COPD (chronic obstructive pulmonary disease) ([place])   . Diabetes mellitus without complication ([place])   . Gallstones   . Gout   . Hard of hearing   . On home O2   . Osteoporosis   . Pleural effusion   . Raynaud's disease     Patient Active Problem List   Diagnosis Date Noted  . Raynaud's disease   . Pancreatitis 02/13/2018  . Routine general medical  examination at a health care facility 07/19/2017  . Gout 03/14/2016  . Atherosclerosis of aorta ([place]) 06/11/2015  . Diabetes mellitus with peripheral vascular disease ([place])   . Chronic respiratory failure with hypercapnia ([place]) 06/02/2015  . COPD, severe ([place]) 03/25/2015  . Pleural effusion on right 03/25/2015  . HTN (hypertension) 08/11/2014  . Hyperlipidemia 08/20/2012    Past Surgical History:  Procedure Laterality Date  . ABDOMINAL HYSTERECTOMY     for cervical cancer 1965  . COLON SURGERY     hemicolectomy 1998  . gallstone surgery     ERCP for gallstone pancreatitis  . ORTHOPEDIC SURGERY       OB History    Gravida  6   Para  6   Term  6   Preterm      AB      Living  5     SAB      TAB      Ectopic      Multiple      Live Births               Home Medications    Prior to Admission medications   Medication Sig Start Date End Date Taking? Authorizing Provider  albuterol (PROVENTIL) (2.5 MG/3ML) 0.083% nebulizer solution Take 2.5 mg by nebulization every 4 (four) hours as needed for wheezing or shortness of breath.   Yes [provider]  alendronate (FOSAMAX) 70 MG tablet TAKE 1 TABLET  BY MOUTH EVERY 7 DAYS. TAKE WITH A FULL GLASS OF WATER ON AN EMPTY STOMACH. 11/13/18  Yes [name], MD  amLODipine (NORVASC) 5 MG tablet Take 1 tablet (5 mg total) by mouth daily. 03/05/18  Yes [name], MD  ANORO ELLIPTA 62.5-25 MCG/INH AEPB TAKE 1 PUFF BY MOUTH EVERY DAY 12/09/18  Yes Juanito Doom, MD  Colchicine 0.6 MG CAPS 1.2 mg orally at the first sign of a flare followed by 0.6 mg one hour later; MAX 1.8 mg over 1 hour  Then 0.6 BID as needed for gout pain 03/11/18  Yes Harris, Abigail, PA-C  gabapentin (NEURONTIN) 300 MG capsule Take 2 capsules (600 mg total) by mouth at bedtime. 01/12/19  Yes [name], MD  Ipratropium-Albuterol (COMBIVENT RESPIMAT) 20-100 MCG/ACT AERS respimat Inhale 1 puff into the lungs every 6 (six)  hours as needed for wheezing.   Yes [provider]  OXYGEN Inhale 3 L into the lungs continuous. [employer]   Yes [provider]  predniSONE (DELTASONE) 20 MG tablet Take 2 tablets (40 mg total) by mouth daily with breakfast. 01/12/19  Yes [name], MD  rosuvastatin (CRESTOR) 20 MG tablet Take 1 tablet (20 mg total) by mouth daily. 02/27/18  Yes [name], MD  levofloxacin (LEVAQUIN) 500 MG tablet Take 1 tablet (500 mg total) by mouth daily. Patient not taking: Reported on 01/14/2019 01/05/19   [name], MD  Respiratory Therapy Supplies (FLUTTER) DEVI Use as directed. 10/01/18   Parrett, Fonnie Mu, NP    Family History Family History  Problem Relation Age of Onset  . Diabetes Sister   . Diabetes Sister   . Diabetes Sister   . Hypertension Mother   . Heart disease Mother   . Diabetes Sister     Social History Social History   Tobacco Use  . Smoking status: Former Smoker    Packs/day: 2.00    Years: 35.00    Pack years: 70.00    Types: Cigarettes    Last attempt to quit: 08/11/1994    Years since quitting: 24.4  . Smokeless tobacco: Never Used  Substance Use Topics  . Alcohol use: No    Alcohol/week: 0.0 standard drinks  . Drug use: No     Allergies   Patient has no known allergies.   Review of Systems Review of Systems  Constitutional:       Per HPI, otherwise negative  HENT:       Per HPI, otherwise negative  Respiratory:       Per HPI, otherwise negative  Cardiovascular:       Per HPI, otherwise negative  Gastrointestinal: Positive for nausea. Negative for vomiting.  Endocrine:       Negative aside from HPI  Genitourinary:       Neg aside from HPI   Musculoskeletal:       Per HPI, otherwise negative  Skin: Negative.   Neurological: Positive for weakness. Negative for syncope.     Physical Exam Updated Vital Signs BP 136/71   Pulse 75   Temp 99.3 F (37.4 C) (Oral)   Resp 17   Ht 5\' 4"  (1.626 m)   Wt 77.1 kg    SpO2 95%   BMI 29.18 kg/m   Physical Exam Vitals signs and nursing note reviewed.  Constitutional:      Appearance: She is ill-appearing.  HENT:     Head: Normocephalic and atraumatic.  Eyes:  Conjunctiva/sclera: Conjunctivae normal.  Cardiovascular:     Rate and Rhythm: Normal rate and regular rhythm.  Pulmonary:     Effort: Pulmonary effort is normal.     Breath sounds: Decreased breath sounds present. No wheezing.  Abdominal:     General: There is no distension.     Palpations: Abdomen is soft.     Tenderness: There is no abdominal tenderness.  Skin:    General: Skin is warm and dry.  Neurological:     Mental Status: She is alert and oriented to person, place, and time.     Cranial Nerves: No cranial nerve deficit.      ED Treatments / Results  Labs (all labs ordered are listed, but only abnormal results are displayed) Labs Reviewed  COMPREHENSIVE METABOLIC PANEL - Abnormal; Notable for the following components:      Result Value   Sodium 133 (*)    Chloride 87 (*)    Glucose, Bld 340 (*)    BUN 38 (*)    Creatinine, Ser 1.15 (*)    Calcium 8.7 (*)    ALT 74 (*)    GFR calc non Af Amer 43 (*)    GFR calc Af Amer 50 (*)    All other components within normal limits  CBC WITH DIFFERENTIAL/PLATELET - Abnormal; Notable for the following components:   WBC 15.6 (*)    Hemoglobin 11.1 (*)    RDW 17.2 (*)    Neutro Abs 14.5 (*)    Abs Immature Granulocytes 0.13 (*)    All other components within normal limits  BRAIN NATRIURETIC PEPTIDE    EKG EKG Interpretation  Date/Time:  Wednesday January 14 2019 12:33:06 EDT Ventricular Rate:  85 PR Interval:    QRS Duration: 90 QT Interval:  360 QTC Calculation: 428 R Axis:   49 Text Interpretation:  Sinus rhythm Abnormal R-wave progression, early transition Baseline wander Abnormal ekg Confirmed by Carmin Muskrat [phone]) on 01/14/2019 12:36:44 PM   Radiology Dg Chest 2 View  Result Date: 01/14/2019 CLINICAL  DATA:  Cough and shortness of breath for 2 weeks. EXAM: CHEST - 2 VIEW COMPARISON:  01/13/2019 FINDINGS: Cardiomediastinal silhouette is normal. Mediastinal contours appear intact. Calcific atherosclerotic disease of the aorta. Persistent nodular airspace consolidation versus subpleural thickening/pulmonary nodule in the right lower lobe, and right subhilar region. Persistent right pleural effusion. Newly appreciated left pleural/subpleural thickening. Osseous structures are without acute abnormality. Soft tissues are grossly normal. IMPRESSION: 1. Persistent nodular airspace consolidation versus subpleural thickening/pulmonary nodule in the right lower lobe and right subhilar region. 2. Persistent right pleural effusion. 3. Newly appreciated left pleural/subpleural thickening. Electronically Signed   By: Fidela Salisbury M.D.   On: 01/14/2019 13:11   Dg Chest 2 View  Result Date: 01/13/2019 CLINICAL DATA:  Diminished breath sounds bilaterally. EXAM: CHEST - 2 VIEW COMPARISON:  10/01/2018 FINDINGS: Cardiomediastinal silhouette is normal. Mediastinal contours appear intact. Persistent right pleural effusion with right lower lobe atelectasis versus airspace consolidation. Fullness in the right subhilar region. Osseous structures are without acute abnormality. Soft tissues are grossly normal. IMPRESSION: 1. Persistent right pleural effusion with right lower lobe atelectasis versus airspace consolidation. 2. Fullness in the right subhilar region may represent overlapping vascular shadows, lymphadenopathy or potentially a pulmonary mass. Electronically Signed   By: Fidela Salisbury M.D.   On: 01/13/2019 16:29   Ct Chest Wo Contrast  Result Date: 01/14/2019 CLINICAL DATA:  Shortness of breath and cough. Rule out pneumonia. Fall 1 week ago.  EXAM: CT CHEST WITHOUT CONTRAST TECHNIQUE: Multidetector CT imaging of the chest was performed following the standard protocol without IV contrast. COMPARISON:  Chest  x-rays January 14, 2019 and January 13, 2019. FINDINGS: Cardiovascular: Coronary artery calcifications involve the right and left coronary arteries. The heart size is normal. The thoracic aorta demonstrates atherosclerotic change without aneurysmal dilatation. The main pulmonary artery is normal in appearance. Mediastinum/Nodes: A right thyroid lobe nodule measures 18 mm. The thyroid is otherwise normal. There is a right-sided pleural effusion. No left-sided pleural effusion or pericardial effusion. The visualized esophagus is unremarkable. No adenopathy in the chest. Lungs/Pleura: There is mild mucus in the trachea. There is debris in the right bronchus intermedius and right lower lobe bronchus. There is opacification of some medial left lower lobe bronchi as well, likely filled with debris. No pneumothorax. There is a nodule in the left upper lobe measuring 8 by 10 by 7 mm with a mean diameter of 8 mm. No other nodules or masses. Mild emphysematous changes in the lungs. The left lung is otherwise unremarkable. The findings on today's chest x-ray not seen on today's CT scan including the scout view. There is a right-sided pleural effusion with underlying opacity. No other abnormalities in either lung. Upper Abdomen: Cholelithiasis is identified. The upper abdomen is otherwise unremarkable. Musculoskeletal: No chest wall mass or suspicious bone lesions identified. IMPRESSION: 1. There is a nodule in the left upper lobe with a mean diameter of 8 mm. Non-contrast chest CT at 6-12 months is recommended. If the nodule is stable at time of repeat CT, then future CT at 18-24 months (from today's scan) is considered optional for low-risk patients, but is recommended for high-risk patients. This recommendation follows the consensus statement: Guidelines for Management of Incidental Pulmonary Nodules Detected on CT Images: From the Fleischner Society 2017; Radiology 2017; 284:228-243. 2. There is a small to moderate right-sided  pleural effusion with underlying opacity. The underlying opacity may simply represent atelectasis. 3. Debris in the bronchus intermedius and bilateral lower lobe bronchi consistent with aspirated material. 4. Atherosclerotic changes in the nonaneurysmal aorta. Coronary artery calcifications. 5. Cholelithiasis. 6. 18 mm right thyroid lobe nodule. A thyroid ultrasound could better evaluate. Aortic Atherosclerosis (ICD10-I70.0) and Emphysema (ICD10-J43.9). Electronically Signed   By: Dorise Bullion III M.D   On: 01/14/2019 16:28    Procedures Procedures (including critical care time)  Medications Ordered in ED Medications  0.9 %  sodium chloride infusion ( Intravenous New Bag/Given 01/14/19 1418)  azithromycin (ZITHROMAX) 500 mg in sodium chloride 0.9 % 250 mL IVPB (0 mg Intravenous Stopped 01/14/19 1610)  cefTRIAXone (ROCEPHIN) 1 g in sodium chloride 0.9 % 100 mL IVPB ( Intravenous Stopped 01/14/19 1451)     Initial Impression / Assessment and Plan / ED Course  I have reviewed the triage vital signs and the nursing notes.  Pertinent labs & imaging results that were available during my care of the patient were reviewed by me and considered in my medical decision making (see chart for details).        4:43 PM I discussed the patient's results with her daughter, who is an employee of our health system. Patient remains in similar condition, hemodynamically unremarkable, though with increased work of breathing. Evaluation notable for concerns for both pleural effusion and aspiration pneumonia. Given the patient's underlying pulmonary disease, home oxygen dependency, with new oxygen requirement, the patient will require admission. Prior to admission the patient received antibiotics, empirically, fluids for elevated BUN, as well  as continuous cardiac monitoring and oxygen supplementation.  Final Clinical Impressions(s) / ED Diagnoses  Community-acquired pneumonia, right lower lobe Pleural  effusion, right   Carmin Muskrat, MD 01/14/19 1645

## 2019-01-15 DIAGNOSIS — A419 Sepsis, unspecified organism: Secondary | ICD-10-CM | POA: Diagnosis present

## 2019-01-15 DIAGNOSIS — J189 Pneumonia, unspecified organism: Secondary | ICD-10-CM | POA: Diagnosis present

## 2019-01-15 DIAGNOSIS — J181 Lobar pneumonia, unspecified organism: Secondary | ICD-10-CM

## 2019-01-15 DIAGNOSIS — R531 Weakness: Secondary | ICD-10-CM

## 2019-01-15 DIAGNOSIS — R911 Solitary pulmonary nodule: Secondary | ICD-10-CM | POA: Diagnosis present

## 2019-01-15 DIAGNOSIS — L8990 Pressure ulcer of unspecified site, unspecified stage: Secondary | ICD-10-CM | POA: Diagnosis present

## 2019-01-15 DIAGNOSIS — R6520 Severe sepsis without septic shock: Secondary | ICD-10-CM

## 2019-01-15 LAB — CBC WITH DIFFERENTIAL/PLATELET
Abs Immature Granulocytes: 0.09 10*3/uL — ABNORMAL HIGH (ref 0.00–0.07)
BASOS ABS: 0 10*3/uL (ref 0.0–0.1)
Basophils Relative: 0 %
Eosinophils Absolute: 0 10*3/uL (ref 0.0–0.5)
Eosinophils Relative: 0 %
HCT: 35.3 % — ABNORMAL LOW (ref 36.0–46.0)
Hemoglobin: 10.3 g/dL — ABNORMAL LOW (ref 12.0–15.0)
Immature Granulocytes: 1 %
Lymphocytes Relative: 7 %
Lymphs Abs: 0.6 10*3/uL — ABNORMAL LOW (ref 0.7–4.0)
MCH: 26.8 pg (ref 26.0–34.0)
MCHC: 29.2 g/dL — ABNORMAL LOW (ref 30.0–36.0)
MCV: 91.9 fL (ref 80.0–100.0)
Monocytes Absolute: 0.1 10*3/uL (ref 0.1–1.0)
Monocytes Relative: 1 %
NRBC: 0 % (ref 0.0–0.2)
Neutro Abs: 8 10*3/uL — ABNORMAL HIGH (ref 1.7–7.7)
Neutrophils Relative %: 91 %
Platelets: 185 10*3/uL (ref 150–400)
RBC: 3.84 MIL/uL — ABNORMAL LOW (ref 3.87–5.11)
RDW: 17.1 % — ABNORMAL HIGH (ref 11.5–15.5)
WBC: 8.7 10*3/uL (ref 4.0–10.5)

## 2019-01-15 LAB — BASIC METABOLIC PANEL
Anion gap: 12 (ref 5–15)
BUN: 31 mg/dL — AB (ref 8–23)
CO2: 33 mmol/L — ABNORMAL HIGH (ref 22–32)
Calcium: 7.9 mg/dL — ABNORMAL LOW (ref 8.9–10.3)
Chloride: 95 mmol/L — ABNORMAL LOW (ref 98–111)
Creatinine, Ser: 0.99 mg/dL (ref 0.44–1.00)
GFR calc Af Amer: 60 mL/min — ABNORMAL LOW (ref >60)
GFR calc non Af Amer: 52 mL/min — ABNORMAL LOW (ref >60)
GLUCOSE: 301 mg/dL — AB (ref 70–99)
Potassium: 4.5 mmol/L (ref 3.5–5.1)
Sodium: 140 mmol/L (ref 135–145)

## 2019-01-15 LAB — URINALYSIS, ROUTINE W REFLEX MICROSCOPIC
Bacteria, UA: NONE SEEN
Bilirubin Urine: NEGATIVE
Glucose, UA: >=500 mg/dL — AB
Hgb urine dipstick: NEGATIVE
Ketones, ur: NEGATIVE mg/dL
Leukocytes,Ua: NEGATIVE
Nitrite: NEGATIVE
Protein, ur: NEGATIVE mg/dL
Specific Gravity, Urine: 1.01 (ref 1.005–1.030)
pH: 6 (ref 5.0–8.0)

## 2019-01-15 LAB — BLOOD GAS, ARTERIAL
Acid-Base Excess: 11.5 mmol/L — ABNORMAL HIGH (ref 0.0–2.0)
Bicarbonate: 34.1 mmol/L — ABNORMAL HIGH (ref 20.0–28.0)
Drawn by: 21310
FIO2: 32
O2 Content: 3 L/min
O2 Saturation: 93 %
Patient temperature: 37
pCO2 arterial: 68.6 mmHg (ref 32.0–48.0)
pH, Arterial: 7.355 (ref 7.350–7.450)
pO2, Arterial: 72.4 mmHg — ABNORMAL LOW (ref 83.0–108.0)

## 2019-01-15 LAB — GLUCOSE, CAPILLARY
GLUCOSE-CAPILLARY: 284 mg/dL — AB (ref 70–99)
Glucose-Capillary: 292 mg/dL — ABNORMAL HIGH (ref 70–99)
Glucose-Capillary: 261 mg/dL — ABNORMAL HIGH (ref 70–99)
Glucose-Capillary: 288 mg/dL — ABNORMAL HIGH (ref 70–99)
Glucose-Capillary: 263 mg/dL — ABNORMAL HIGH (ref 70–99)

## 2019-01-15 LAB — MAGNESIUM: Magnesium: 1.8 mg/dL (ref 1.7–2.4)

## 2019-01-15 LAB — STREP PNEUMONIAE URINARY ANTIGEN: Strep Pneumo Urinary Antigen: NEGATIVE

## 2019-01-15 LAB — LACTIC ACID, PLASMA
Lactic Acid, Venous: 3.2 mmol/L (ref 0.5–1.9)
Lactic Acid, Venous: 2.6 mmol/L (ref 0.5–1.9)

## 2019-01-15 MED ORDER — ACETAZOLAMIDE 250 MG PO TABS
250.0000 mg | ORAL_TABLET | Freq: Two times a day (BID) | ORAL | Status: DC
  Filled 2019-01-15 (×3): qty 1

## 2019-01-15 MED ORDER — SODIUM CHLORIDE 0.9 % IV SOLN
500.0000 mg | INTRAVENOUS | Status: DC
  Administered 2019-01-15 – 2019-01-16 (×2): 500 mg via INTRAVENOUS
  Filled 2019-01-15 (×2): qty 500

## 2019-01-15 MED ORDER — SODIUM CHLORIDE 0.9 % IV BOLUS
1000.0000 mL | Freq: Once | INTRAVENOUS | Status: AC
  Administered 2019-01-15: 1000 mL via INTRAVENOUS

## 2019-01-15 MED ORDER — METHYLPREDNISOLONE SODIUM SUCC 40 MG IJ SOLR
40.0000 mg | Freq: Two times a day (BID) | INTRAMUSCULAR | Status: DC
  Administered 2019-01-15 – 2019-01-16 (×2): 40 mg via INTRAVENOUS
  Filled 2019-01-15 (×2): qty 1

## 2019-01-15 NOTE — Progress Notes (Signed)
CRITICAL VALUE ALERT  Critical Value:  Lactic Acid 2.6  Date & Time Notied:  01/14/19 at 1950  Provider Notified:Dr. Velia Meyer  Orders Received/Actions taken: Orders given

## 2019-01-15 NOTE — Progress Notes (Signed)
PT Cancellation Note  Patient Details Name: Emily Owen MRN: [id number] DOB: Oct 31, 1929   Cancelled Treatment:    Reason Eval/Treat Not Completed: Patient declined, no reason specified(PT woke pt up from sleeping and asked about participating in therapy but pt stated she was tired and wanted to sleep and asked that PT come back at a later time. Will check back at a later time as able/appropriate.)   Geraldine Solar PT, DPT

## 2019-01-15 NOTE — Telephone Encounter (Signed)
Judson Roch took care of this yesterday. Message will be closed.

## 2019-01-15 NOTE — Progress Notes (Signed)
PROGRESS NOTE    Emily Owen  POE:[id number] DOB: 07-29-1930 DOA: 01/14/2019 PCP: [name], MD   Brief Narrative:  Per HPI: Emily Owen is a 85 y.o. female with medical history significant for chronic hypoxic respiratory failure in the setting of severe COPD on 3 L continuous supplemental oxygen, type 2 diabetes mellitus, hypertension, who is admitted to [facility] on 01/14/2019 with severe sepsis due to right lower lobe pneumonia after presenting from home to the [facility] ED complaining of shortness of breath.  The patient reports 3 to 4 days of progressive shortness of breath associated with new onset nonproductive cough and wheezing. She also noted generalized weakness over that timeframe in the absence of any acute focal weakness. She denies any associated subjective fever, rigors, generalized myalgias, or rash.  She reports that her shortness of breath has not been associated with any orthopnea, PND, or peripheral edema.  Denies any associated chest pain, palpitations, diaphoresis, dizziness, or hemoptysis.  She experienced some nausea approximately 2 days ago, but states that this was not associated with any vomiting, and has independently resolved in the interval.  She denies any abdominal pain or diarrhea.  Denies any recent traveling or known sick contacts, including no known exposure to COVID-19 cases. Denies any recent trauma.  Denies any associated rhinitis, rhinorrhea, or sore throat.  In the setting of the above symptoms, the patient saw her PCP on 01/12/2019, who diagnosed her with an acute COPD exacerbation at that time.  Consequently, she was prescribed a prednisone taper by her PCP, but has not experienced any improvement in her breathing following 2 subsequent doses of prednisone 40 mg, prompting her to present to the emergency department today for further evaluation of her ongoing shortness of breath.   Patient was admitted with severe sepsis due to  right lower lobe pneumonia along with associated acute on chronic hypoxemic respiratory failure and acute COPD exacerbation.  Assessment & Plan:   Principal Problem:   Severe sepsis ([place]) Active Problems:   Acute on chronic respiratory failure with hypoxia (HCC)   COPD with acute exacerbation (HCC)   HTN (hypertension)   Diabetes mellitus with peripheral vascular disease (HCC)   Pressure injury of skin   RLL pneumonia (HCC)   Generalized weakness   Left upper lobe pulmonary nodule  1. Severe sepsis secondary to right lower lobe pneumonia.  Maintain on IV normal saline for now at higher rate and recheck lactic acid levels in a.m. to ensure downward trend.  Maintain on Zosyn for aspiration concerns and add back azithromycin for atypical coverage.  Will check urine strep pneumonia and Legionella as well.  MRSA negative.  Continue to monitor blood cultures. 2. Acute on chronic hypoxemic respiratory failure secondary to COPD exacerbation related to above.  Continue DuoNebs every 6 hours as well as IV steroids as outlined.  ABG with normal pH and findings of chronic hypercapnia with no changes to mentation noted.  Will discontinue acetazolamide. 3. Generalized weakness-likely secondary to above.  No acute focal neurological deficits identified on general exam.  PT evaluation pending. 4. Left upper lobe lung nodule.  Follow-up CT scan in 6 to 12 months. 5. Type 2 diabetes.  Blood glucose remains quite elevated.  Will ensure that there is SSI and reduce Solu-Medrol dosing to 40 mg IV twice daily. 6. Essential hypertension.  Soft blood pressures identified in the setting of sepsis.  Will hold Norvasc for now and reinitiate as blood pressures trend upwards.  DVT prophylaxis: Lovenox Code Status: DNR/DNI Family Communication: None at bedside Disposition Plan: Continue IV antibiotics and treatment for COPD exacerbation as outlined above and monitor response.   Consultants:   None  Procedures:    None  Antimicrobials:   Zosyn 3/25->  Azithromycin 3/26->   Subjective: Patient seen and evaluated today with no new acute complaints or concerns. No acute concerns or events noted overnight.  She appears to feel somewhat better and less short of breath this morning and is resting.  Not near her baseline as of yet.  Objective: Vitals:   01/14/19 2017 01/14/19 2108 01/15/19 0527 01/15/19 0808  BP:  (!) 124/55 112/60   Pulse:  96 78   Resp:  20 20   Temp:  98.4 F (36.9 C) 97.8 F (36.6 C)   TempSrc:  Oral Oral   SpO2: 95% 98% 97% 95%  Weight:      Height:        Intake/Output Summary (Last 24 hours) at 01/15/2019 0832 Last data filed at 01/15/2019 0606 Gross per 24 hour  Intake 1862.39 ml  Output 900 ml  Net 962.39 ml   Filed Weights   01/14/19 1228 01/14/19 1815  Weight: 77.1 kg 78.4 kg    Examination:  General exam: Appears calm and comfortable  Respiratory system: Clear to auscultation. Respiratory effort normal.  Currently on 2 L nasal cannula. Cardiovascular system: S1 & S2 heard, RRR. No JVD, murmurs, rubs, gallops or clicks. No pedal edema. Gastrointestinal system: Abdomen is nondistended, soft and nontender. No organomegaly or masses felt. Normal bowel sounds heard. Central nervous system: Alert and oriented. No focal neurological deficits. Extremities: Symmetric 5 x 5 power. Skin: No rashes, lesions or ulcers Psychiatry: Judgement and insight appear normal. Mood & affect appropriate.     Data Reviewed: I have personally reviewed following labs and imaging studies  CBC: Recent Labs  Lab 01/14/19 1230 01/15/19 0427  WBC 15.6* 8.7  NEUTROABS 14.5* 8.0*  HGB 11.1* 10.3*  HCT 36.8 35.3*  MCV 91.8 91.9  PLT 205 563   Basic Metabolic Panel: Recent Labs  Lab 01/14/19 1230 01/15/19 0427  NA 133* 140  K 4.7 4.5  CL 87* 95*  CO2 32 33*  GLUCOSE 340* 301*  BUN 38* 31*  CREATININE 1.15* 0.99  CALCIUM 8.7* 7.9*  MG 1.8 1.8   GFR: Estimated  Creatinine Clearance: 41.3 mL/min (by C-G formula based on SCr of 0.99 mg/dL). Liver Function Tests: Recent Labs  Lab 01/14/19 1230  AST 36  ALT 74*  ALKPHOS 75  BILITOT 0.7  PROT 6.5  ALBUMIN 3.7   No results for input(s): LIPASE, AMYLASE in the last 168 hours. No results for input(s): AMMONIA in the last 168 hours. Coagulation Profile: No results for input(s): INR, PROTIME in the last 168 hours. Cardiac Enzymes: No results for input(s): CKTOTAL, CKMB, CKMBINDEX, TROPONINI in the last 168 hours. BNP (last 3 results) No results for input(s): PROBNP in the last 8760 hours. HbA1C: No results for input(s): HGBA1C in the last 72 hours. CBG: Recent Labs  Lab 01/14/19 2104 01/15/19 0723  GLUCAP 255* 292*   Lipid Profile: No results for input(s): CHOL, HDL, LDLCALC, TRIG, CHOLHDL, LDLDIRECT in the last 72 hours. Thyroid Function Tests: No results for input(s): TSH, T4TOTAL, FREET4, T3FREE, THYROIDAB in the last 72 hours. Anemia Panel: No results for input(s): VITAMINB12, FOLATE, FERRITIN, TIBC, IRON, RETICCTPCT in the last 72 hours. Sepsis Labs: Recent Labs  Lab 01/14/19 1901 01/14/19 2226  01/15/19 0427  LATICACIDVEN 2.6* 3.2* 2.6*    Recent Results (from the past 240 hour(s))  Culture, blood (routine x 2)     Status: None (Preliminary result)   Collection Time: 01/14/19 12:45 PM  Result Value Ref Range Status   Specimen Description BLOOD RIGHT ANTECUBITAL  Final   Special Requests   Final    BOTTLES DRAWN AEROBIC AND ANAEROBIC Blood Culture results may not be optimal due to an inadequate volume of blood received in culture bottles   Culture   Final    NO GROWTH < 12 HOURS Performed at [facility], [address] [place], [place] [address]    Report Status PENDING  Incomplete  Culture, blood (routine x 2)     Status: None (Preliminary result)   Collection Time: 01/14/19  7:01 PM  Result Value Ref Range Status   Specimen Description BLOOD RIGHT ANTECUBITAL  Final    Special Requests   Final    BOTTLES DRAWN AEROBIC AND ANAEROBIC Blood Culture adequate volume   Culture   Final    NO GROWTH < 12 HOURS Performed at [facility], [address], [place], [place] [address]    Report Status PENDING  Incomplete  MRSA PCR Screening     Status: None   Collection Time: 01/14/19  8:05 PM  Result Value Ref Range Status   MRSA by PCR NEGATIVE NEGATIVE Final    Comment:        The GeneXpert MRSA Assay (FDA approved for NASAL specimens only), is one component of a comprehensive MRSA colonization surveillance program. It is not intended to diagnose MRSA infection nor to guide or monitor treatment for MRSA infections. Performed at [facility], [address] [place], [place] [address]          Radiology Studies: Dg Chest 2 View  Result Date: 01/14/2019 CLINICAL DATA:  Cough and shortness of breath for 2 weeks. EXAM: CHEST - 2 VIEW COMPARISON:  01/13/2019 FINDINGS: Cardiomediastinal silhouette is normal. Mediastinal contours appear intact. Calcific atherosclerotic disease of the aorta. Persistent nodular airspace consolidation versus subpleural thickening/pulmonary nodule in the right lower lobe, and right subhilar region. Persistent right pleural effusion. Newly appreciated left pleural/subpleural thickening. Osseous structures are without acute abnormality. Soft tissues are grossly normal. IMPRESSION: 1. Persistent nodular airspace consolidation versus subpleural thickening/pulmonary nodule in the right lower lobe and right subhilar region. 2. Persistent right pleural effusion. 3. Newly appreciated left pleural/subpleural thickening. Electronically Signed   By: Fidela Salisbury M.D.   On: 01/14/2019 13:11   Dg Chest 2 View  Result Date: 01/13/2019 CLINICAL DATA:  Diminished breath sounds bilaterally. EXAM: CHEST - 2 VIEW COMPARISON:  10/01/2018 FINDINGS: Cardiomediastinal silhouette is normal. Mediastinal contours appear intact. Persistent right pleural  effusion with right lower lobe atelectasis versus airspace consolidation. Fullness in the right subhilar region. Osseous structures are without acute abnormality. Soft tissues are grossly normal. IMPRESSION: 1. Persistent right pleural effusion with right lower lobe atelectasis versus airspace consolidation. 2. Fullness in the right subhilar region may represent overlapping vascular shadows, lymphadenopathy or potentially a pulmonary mass. Electronically Signed   By: Fidela Salisbury M.D.   On: 01/13/2019 16:29   Ct Chest Wo Contrast  Result Date: 01/14/2019 CLINICAL DATA:  Shortness of breath and cough. Rule out pneumonia. Fall 1 week ago. EXAM: CT CHEST WITHOUT CONTRAST TECHNIQUE: Multidetector CT imaging of the chest was performed following the standard protocol without IV contrast. COMPARISON:  Chest x-rays January 14, 2019 and January 13, 2019. FINDINGS: Cardiovascular: Coronary artery calcifications involve the right and left coronary arteries. The heart size is normal. The thoracic aorta demonstrates atherosclerotic change without aneurysmal dilatation. The main pulmonary artery is normal in appearance. Mediastinum/Nodes: A right thyroid lobe nodule measures 18 mm. The thyroid is otherwise normal. There is a right-sided pleural effusion. No left-sided pleural effusion or pericardial effusion. The visualized esophagus is unremarkable. No adenopathy in the chest. Lungs/Pleura: There is mild mucus in the trachea. There is debris in the right bronchus intermedius and right lower lobe bronchus. There is opacification of some medial left lower lobe bronchi as well, likely filled with debris. No pneumothorax. There is a nodule in the left upper lobe measuring 8 by 10 by 7 mm with a mean diameter of 8 mm. No other nodules or masses. Mild emphysematous changes in the lungs. The left lung is otherwise unremarkable. The findings on today's chest x-ray not seen on today's CT scan including the scout view. There is a  right-sided pleural effusion with underlying opacity. No other abnormalities in either lung. Upper Abdomen: Cholelithiasis is identified. The upper abdomen is otherwise unremarkable. Musculoskeletal: No chest wall mass or suspicious bone lesions identified. IMPRESSION: 1. There is a nodule in the left upper lobe with a mean diameter of 8 mm. Non-contrast chest CT at 6-12 months is recommended. If the nodule is stable at time of repeat CT, then future CT at 18-24 months (from today's scan) is considered optional for low-risk patients, but is recommended for high-risk patients. This recommendation follows the consensus statement: Guidelines for Management of Incidental Pulmonary Nodules Detected on CT Images: From the Fleischner Society 2017; Radiology 2017; 284:228-243. 2. There is a small to moderate right-sided pleural effusion with underlying opacity. The underlying opacity may simply represent atelectasis. 3. Debris in the bronchus intermedius and bilateral lower lobe bronchi consistent with aspirated material. 4. Atherosclerotic changes in the nonaneurysmal aorta. Coronary artery calcifications. 5. Cholelithiasis. 6. 18 mm right thyroid lobe nodule. A thyroid ultrasound could better evaluate. Aortic Atherosclerosis (ICD10-I70.0) and Emphysema (ICD10-J43.9). Electronically Signed   By: Dorise Bullion III M.D   On: 01/14/2019 16:28        Scheduled Meds: . chlorhexidine  15 mL Mouth Rinse BID  . enoxaparin (LOVENOX) injection  40 mg Subcutaneous Q24H  . insulin aspart  0-15 Units Subcutaneous TID WC  . insulin aspart  0-5 Units Subcutaneous QHS  . ipratropium-albuterol  3 mL Nebulization Q6H WA  . mouth rinse  15 mL Mouth Rinse q12n4p  . methylPREDNISolone (SOLU-MEDROL) injection  40 mg Intravenous Q6H  . rosuvastatin  20 mg Oral q1800   Continuous Infusions: . sodium chloride 125 mL/hr at 01/15/19 0000  . azithromycin    . piperacillin-tazobactam (ZOSYN)  IV 3.375 g (01/15/19 0352)      LOS: 1 day    Time spent: 30 minutes     Darleen Crocker, DO [facility] Pager [phone]  If 7PM-7AM, please contact night-coverage [email or web address] Password [facility] 01/15/2019, 8:32 AM

## 2019-01-15 NOTE — Progress Notes (Signed)
CRITICAL VALUE ALERT  Critical Value:  Lactic Acid 3.2  Date & Time Notied: 01/12/19 at [place]  Provider Notified: Dr. Olevia Bowens  Orders Received/Actions taken: Orders for NS bolus given

## 2019-01-15 NOTE — Progress Notes (Signed)
CRITICAL VALUE ALERT  Critical Value:  Lactic Acid 2.6 and pC02 68.6  Date & Time Notied:  01/15/19 at 0600  Provider Notified: Dr. Olevia Bowens  Orders Received/Actions taken: yes

## 2019-01-15 NOTE — Progress Notes (Signed)
PT Cancellation Note  Patient Details Name: Emily Owen MRN: [id number] DOB: 03-Mar-1930   Cancelled Treatment:    Reason Eval/Treat Not Completed: Patient declined, no reason specified(RN in room and stated she just got pt to the chair for lunch; pt declined working with therapy again stating "I don't want to walk, I'm hungry." PT will check back at a later time/day as appropriate for evaluation of pt.)   Geraldine Solar PT, DPT 01/15/2019, 11:48 AM

## 2019-01-16 LAB — BASIC METABOLIC PANEL
Anion gap: 9 (ref 5–15)
BUN: 29 mg/dL — ABNORMAL HIGH (ref 8–23)
CO2: 32 mmol/L (ref 22–32)
Calcium: 7.4 mg/dL — ABNORMAL LOW (ref 8.9–10.3)
Chloride: 100 mmol/L (ref 98–111)
Creatinine, Ser: 0.93 mg/dL (ref 0.44–1.00)
GFR calc Af Amer: >60 mL/min (ref >60)
GFR calc non Af Amer: 56 mL/min — ABNORMAL LOW (ref >60)
Glucose, Bld: 242 mg/dL — ABNORMAL HIGH (ref 70–99)
POTASSIUM: 4.3 mmol/L (ref 3.5–5.1)
Sodium: 141 mmol/L (ref 135–145)

## 2019-01-16 LAB — CBC
HCT: 33.8 % — ABNORMAL LOW (ref 36.0–46.0)
Hemoglobin: 9.8 g/dL — ABNORMAL LOW (ref 12.0–15.0)
MCH: 26.9 pg (ref 26.0–34.0)
MCHC: 29 g/dL — ABNORMAL LOW (ref 30.0–36.0)
MCV: 92.9 fL (ref 80.0–100.0)
PLATELETS: 227 10*3/uL (ref 150–400)
RBC: 3.64 MIL/uL — ABNORMAL LOW (ref 3.87–5.11)
RDW: 17.4 % — ABNORMAL HIGH (ref 11.5–15.5)
WBC: 12.4 10*3/uL — AB (ref 4.0–10.5)
nRBC: 0 % (ref 0.0–0.2)

## 2019-01-16 LAB — GLUCOSE, CAPILLARY
GLUCOSE-CAPILLARY: 212 mg/dL — AB (ref 70–99)
Glucose-Capillary: 152 mg/dL — ABNORMAL HIGH (ref 70–99)
Glucose-Capillary: 268 mg/dL — ABNORMAL HIGH (ref 70–99)
Glucose-Capillary: 235 mg/dL — ABNORMAL HIGH (ref 70–99)

## 2019-01-16 LAB — LACTIC ACID, PLASMA: Lactic Acid, Venous: 2.1 mmol/L (ref 0.5–1.9)

## 2019-01-16 MED ORDER — IPRATROPIUM-ALBUTEROL 0.5-2.5 (3) MG/3ML IN SOLN
3.0000 mL | RESPIRATORY_TRACT | Status: DC | PRN
  Administered 2019-01-16: 3 mL via RESPIRATORY_TRACT
  Filled 2019-01-16: qty 3

## 2019-01-16 MED ORDER — AMLODIPINE BESYLATE 5 MG PO TABS
5.0000 mg | ORAL_TABLET | Freq: Every day | ORAL | Status: DC
  Administered 2019-01-16 – 2019-01-17 (×2): 5 mg via ORAL
  Filled 2019-01-16 (×2): qty 1

## 2019-01-16 MED ORDER — PREDNISONE 20 MG PO TABS
40.0000 mg | ORAL_TABLET | Freq: Every day | ORAL | Status: DC
  Administered 2019-01-16 – 2019-01-17 (×2): 40 mg via ORAL
  Filled 2019-01-16 (×2): qty 2

## 2019-01-16 MED ORDER — IPRATROPIUM-ALBUTEROL 20-100 MCG/ACT IN AERS: 1.0000 | INHALATION_SPRAY | RESPIRATORY_TRACT | Status: DC | PRN

## 2019-01-16 MED ORDER — SODIUM CHLORIDE 0.9 % IV BOLUS: 500.0000 mL | Freq: Once | INTRAVENOUS | Status: DC

## 2019-01-16 NOTE — Progress Notes (Signed)
PROGRESS NOTE    Emily Owen  DXI:[id number] DOB: Sep 01, 1930 DOA: 01/14/2019 PCP: [name], MD   Brief Narrative:  Per HPI: Emily Legros Morrisonis a 85 y.o.femalewith medical history significant forchronic hypoxic respiratory failure in the setting of severe COPD on 3 L continuous supplemental oxygen, type 2 diabetes mellitus, hypertension, who is admitted [facility] on3/25/2020 with severe sepsis due to right lower lobe pneumonia after [facility].  The patient reports 3 to 4 days of progressive shortness of breath associated with new onset nonproductive cough and wheezing.She also noted generalized weakness over that timeframe in the absence of any acute focal weakness.She denies any associated subjective fever, rigors, generalized myalgias, or rash. She reports that her shortness of breath has not been associated with any orthopnea, PND, or peripheral edema.Denies any associated chest pain, palpitations, diaphoresis, dizziness,or hemoptysis.She experienced some nausea approximately 2 days ago, but states that this was not associated with any vomiting, and has independently resolved in the interval. She denies any abdominal pain or diarrhea. Denies any recent traveling or known sick contacts,including noknownexposure toCOVID-19 cases. Denies any recent trauma. Denies any associated rhinitis, rhinorrhea, or sore throat. In the setting of the above symptoms, the patient saw her PCP on 01/12/2019, who diagnosed her with an acute COPD exacerbation at that time.Consequently, she was prescribed a prednisone taper by her PCP, but has not experienced any improvement in her breathing following 2 subsequent doses of prednisone 40 mg,prompting her to present to the emergency department today for further evaluation of her ongoing shortness of breath.  Patient was admitted with severe sepsis due to  right lower lobe pneumonia along with associated acute on chronic hypoxemic respiratory failure and acute COPD exacerbation.   Assessment & Plan:   Principal Problem:   Severe sepsis ([place]) Active Problems:   Acute on chronic respiratory failure with hypoxia (HCC)   COPD with acute exacerbation (HCC)   HTN (hypertension)   Diabetes mellitus with peripheral vascular disease (HCC)   Pressure injury of skin   RLL pneumonia (HCC)   Generalized weakness   Left upper lobe pulmonary nodule  1. Severe sepsis secondary to right lower lobe pneumonia.  Maintain on IV normal saline for now at higher rate and recheck lactic acid levels in a.m. to ensure downward trend.  Maintain on Zosyn for aspiration concerns and add back azithromycin for atypical coverage.    Urine strep pneumonia negative and Legionella pending. MRSA negative.  Continue to monitor blood cultures.  Anticipate change to Augmentin on discharge. 2. Acute on chronic hypoxemic respiratory failure secondary to COPD exacerbation related to above.  Continue on metered-dose inhalers, now as needed with change to oral prednisone today..  ABG with normal pH and findings of chronic hypercapnia with no changes to mentation noted.   3. Generalized weakness-likely secondary to above.  No acute focal neurological deficits identified on general exam.  PT evaluation pending. 4. Left upper lobe lung nodule.  Follow-up CT scan in 6 to 12 months. 5. Type 2 diabetes.  Blood glucose remains quite elevated.  Will ensure that there is SSI and reduce to prednisone 40 mg oral daily today. 6. Essential hypertension.    Blood pressure is up trending.  Will reinitiate Norvasc.  DVT prophylaxis: Lovenox Code Status: DNR/DNI Family Communication: None at bedside Disposition Plan: Continue IV antibiotics and treatment for COPD exacerbation as outlined above and monitor response.   Consultants:   None  Procedures:   None  Antimicrobials:   Zosyn  3/25->  Azithromycin 3/26->  Subjective: Patient seen and evaluated today with no new acute complaints or concerns. No acute concerns or events noted overnight.  She is starting to approach her baseline and is willing to participate in physical therapy today.  Objective: Vitals:   01/15/19 2004 01/15/19 2106 01/15/19 2107 01/16/19 0534  BP:  138/62 138/62 134/71  Pulse: 71 88 87 79  Resp: 18 18 18 18   Temp:  98.1 F (36.7 C) 98.1 F (36.7 C) 98.6 F (37 C)  TempSrc:  Oral Oral Oral  SpO2: 97% 100% 100% 98%  Weight:      Height:        Intake/Output Summary (Last 24 hours) at 01/16/2019 0835 Last data filed at 01/16/2019 0641 Gross per 24 hour  Intake 2891.09 ml  Output 1200 ml  Net 1691.09 ml   Filed Weights   01/14/19 1228 01/14/19 1815  Weight: 77.1 kg 78.4 kg    Examination:  General exam: Appears calm and comfortable  Respiratory system: Clear to auscultation. Respiratory effort normal.  Currently on 2 L nasal cannula. Cardiovascular system: S1 & S2 heard, RRR. No JVD, murmurs, rubs, gallops or clicks. No pedal edema. Gastrointestinal system: Abdomen is nondistended, soft and nontender. No organomegaly or masses felt. Normal bowel sounds heard. Central nervous system: Alert and oriented. No focal neurological deficits. Extremities: Symmetric 5 x 5 power. Skin: No rashes, lesions or ulcers Psychiatry: Judgement and insight appear normal. Mood & affect appropriate.     Data Reviewed: I have personally reviewed following labs and imaging studies  CBC: Recent Labs  Lab 01/14/19 1230 01/15/19 0427 01/16/19 0423  WBC 15.6* 8.7 12.4*  NEUTROABS 14.5* 8.0*  --   HGB 11.1* 10.3* 9.8*  HCT 36.8 35.3* 33.8*  MCV 91.8 91.9 92.9  PLT 205 185 664   Basic Metabolic Panel: Recent Labs  Lab 01/14/19 1230 01/15/19 0427 01/16/19 0423  NA 133* 140 141  K 4.7 4.5 4.3  CL 87* 95* 100  CO2 32 33* 32  GLUCOSE 340* 301* 242*  BUN 38* 31* 29*  CREATININE 1.15* 0.99  0.93  CALCIUM 8.7* 7.9* 7.4*  MG 1.8 1.8  --    GFR: Estimated Creatinine Clearance: 44 mL/min (by C-G formula based on SCr of 0.93 mg/dL). Liver Function Tests: Recent Labs  Lab 01/14/19 1230  AST 36  ALT 74*  ALKPHOS 75  BILITOT 0.7  PROT 6.5  ALBUMIN 3.7   No results for input(s): LIPASE, AMYLASE in the last 168 hours. No results for input(s): AMMONIA in the last 168 hours. Coagulation Profile: No results for input(s): INR, PROTIME in the last 168 hours. Cardiac Enzymes: No results for input(s): CKTOTAL, CKMB, CKMBINDEX, TROPONINI in the last 168 hours. BNP (last 3 results) No results for input(s): PROBNP in the last 8760 hours. HbA1C: No results for input(s): HGBA1C in the last 72 hours. CBG: Recent Labs  Lab 01/15/19 1057 01/15/19 1632 01/15/19 1921 01/15/19 2124 01/16/19 0737  GLUCAP 261* 284* 288* 263* 212*   Lipid Profile: No results for input(s): CHOL, HDL, LDLCALC, TRIG, CHOLHDL, LDLDIRECT in the last 72 hours. Thyroid Function Tests: No results for input(s): TSH, T4TOTAL, FREET4, T3FREE, THYROIDAB in the last 72 hours. Anemia Panel: No results for input(s): VITAMINB12, FOLATE, FERRITIN, TIBC, IRON, RETICCTPCT in the last 72 hours. Sepsis Labs: Recent Labs  Lab 01/14/19 1901 01/14/19 2226 01/15/19 0427 01/16/19 0423  LATICACIDVEN 2.6* 3.2* 2.6*  2.1*    Recent Results (from the past 240 hour(s))  Culture, blood (routine x 2)     Status: None (Preliminary result)   Collection Time: 01/14/19 12:45 PM  Result Value Ref Range Status   Specimen Description BLOOD RIGHT ANTECUBITAL  Final   Special Requests   Final    BOTTLES DRAWN AEROBIC AND ANAEROBIC Blood Culture results may not be optimal due to an inadequate volume of blood received in culture bottles   Culture   Final    NO GROWTH 2 DAYS Performed at [facility], [address] [place], [place] [address]    Report Status PENDING  Incomplete  Culture, blood (routine x 2)     Status: None  (Preliminary result)   Collection Time: 01/14/19  7:01 PM  Result Value Ref Range Status   Specimen Description BLOOD RIGHT ANTECUBITAL  Final   Special Requests   Final    BOTTLES DRAWN AEROBIC AND ANAEROBIC Blood Culture adequate volume   Culture   Final    NO GROWTH 2 DAYS Performed at [facility], [address] [place], [place] [address]    Report Status PENDING  Incomplete  MRSA PCR Screening     Status: None   Collection Time: 01/14/19  8:05 PM  Result Value Ref Range Status   MRSA by PCR NEGATIVE NEGATIVE Final    Comment:        The GeneXpert MRSA Assay (FDA approved for NASAL specimens only), is one component of a comprehensive MRSA colonization surveillance program. It is not intended to diagnose MRSA infection nor to guide or monitor treatment for MRSA infections. Performed at [facility], [address] [place], [place] [address]          Radiology Studies: Dg Chest 2 View  Result Date: 01/14/2019 CLINICAL DATA:  Cough and shortness of breath for 2 weeks. EXAM: CHEST - 2 VIEW COMPARISON:  01/13/2019 FINDINGS: Cardiomediastinal silhouette is normal. Mediastinal contours appear intact. Calcific atherosclerotic disease of the aorta. Persistent nodular airspace consolidation versus subpleural thickening/pulmonary nodule in the right lower lobe, and right subhilar region. Persistent right pleural effusion. Newly appreciated left pleural/subpleural thickening. Osseous structures are without acute abnormality. Soft tissues are grossly normal. IMPRESSION: 1. Persistent nodular airspace consolidation versus subpleural thickening/pulmonary nodule in the right lower lobe and right subhilar region. 2. Persistent right pleural effusion. 3. Newly appreciated left pleural/subpleural thickening. Electronically Signed   By: Fidela Salisbury M.D.   On: 01/14/2019 13:11   Ct Chest Wo Contrast  Result Date: 01/14/2019 CLINICAL DATA:  Shortness of breath and cough. Rule out  pneumonia. Fall 1 week ago. EXAM: CT CHEST WITHOUT CONTRAST TECHNIQUE: Multidetector CT imaging of the chest was performed following the standard protocol without IV contrast. COMPARISON:  Chest x-rays January 14, 2019 and January 13, 2019. FINDINGS: Cardiovascular: Coronary artery calcifications involve the right and left coronary arteries. The heart size is normal. The thoracic aorta demonstrates atherosclerotic change without aneurysmal dilatation. The main pulmonary artery is normal in appearance. Mediastinum/Nodes: A right thyroid lobe nodule measures 18 mm. The thyroid is otherwise normal. There is a right-sided pleural effusion. No left-sided pleural effusion or pericardial effusion. The visualized esophagus is unremarkable. No adenopathy in the chest. Lungs/Pleura: There is mild mucus in the trachea. There is debris in the right bronchus intermedius and right lower lobe bronchus. There is opacification of some medial left lower lobe bronchi as well, likely filled with debris. No pneumothorax. There is a nodule in the  left upper lobe measuring 8 by 10 by 7 mm with a mean diameter of 8 mm. No other nodules or masses. Mild emphysematous changes in the lungs. The left lung is otherwise unremarkable. The findings on today's chest x-ray not seen on today's CT scan including the scout view. There is a right-sided pleural effusion with underlying opacity. No other abnormalities in either lung. Upper Abdomen: Cholelithiasis is identified. The upper abdomen is otherwise unremarkable. Musculoskeletal: No chest wall mass or suspicious bone lesions identified. IMPRESSION: 1. There is a nodule in the left upper lobe with a mean diameter of 8 mm. Non-contrast chest CT at 6-12 months is recommended. If the nodule is stable at time of repeat CT, then future CT at 18-24 months (from today's scan) is considered optional for low-risk patients, but is recommended for high-risk patients. This recommendation follows the consensus  statement: Guidelines for Management of Incidental Pulmonary Nodules Detected on CT Images: From the Fleischner Society 2017; Radiology 2017; 284:228-243. 2. There is a small to moderate right-sided pleural effusion with underlying opacity. The underlying opacity may simply represent atelectasis. 3. Debris in the bronchus intermedius and bilateral lower lobe bronchi consistent with aspirated material. 4. Atherosclerotic changes in the nonaneurysmal aorta. Coronary artery calcifications. 5. Cholelithiasis. 6. 18 mm right thyroid lobe nodule. A thyroid ultrasound could better evaluate. Aortic Atherosclerosis (ICD10-I70.0) and Emphysema (ICD10-J43.9). Electronically Signed   By: Dorise Bullion III M.D   On: 01/14/2019 16:28        Scheduled Meds:  chlorhexidine  15 mL Mouth Rinse BID   enoxaparin (LOVENOX) injection  40 mg Subcutaneous Q24H   insulin aspart  0-15 Units Subcutaneous TID WC   insulin aspart  0-5 Units Subcutaneous QHS   ipratropium-albuterol  3 mL Nebulization Q6H WA   mouth rinse  15 mL Mouth Rinse q12n4p   predniSONE  40 mg Oral Q breakfast   rosuvastatin  20 mg Oral q1800   Continuous Infusions:  sodium chloride 125 mL/hr at 01/16/19 0814   azithromycin 500 mg (01/15/19 1610)   piperacillin-tazobactam (ZOSYN)  IV 3.375 g (01/16/19 0541)     LOS: 2 days    Time spent: 30 minutes    Sadee Osland Darleen Crocker, DO [facility] Pager [phone]  If 7PM-7AM, please contact night-coverage [email or web address] Password [facility] 01/16/2019, 8:35 AM

## 2019-01-16 NOTE — TOC Progression Note (Addendum)
Transition of Care [facility]) - Progression Note    Patient Details  Name: Emily Owen MRN: [id number] Date of Birth: [birth year]  [facility] CM/SW Contact  Giamarie Bueche, Chauncey Reading, RN Phone Number: 01/16/2019, 3:34 PM  Clinical Narrative:   Patient has accepted bed offer from [facility].  Anticipate DC 01/17/19. Kerri at [facility] aware. Patient is calling daughter to notify her.   Expected Discharge Plan: [place] Barriers to Discharge: No Barriers Identified  Expected Discharge Plan and Services Expected Discharge Plan: [place]   Discharge Planning Services: CM Consult   Living arrangements for the past 2 months: [place] Expected Discharge Date: 01/17/19                         Social Determinants of Health (SDOH) Interventions    Readmission Risk Interventions No flowsheet data found.

## 2019-01-16 NOTE — Plan of Care (Signed)
  Problem: Acute Rehab PT Goals(only PT should resolve) Goal: Pt Will Go Supine/Side To Sit Outcome: Progressing Flowsheets (Taken 01/16/2019 0920) Pt will go Supine/Side to Sit: with modified independence Goal: Patient Will Transfer Sit To/From Stand Outcome: Progressing Flowsheets (Taken 01/16/2019 0920) Patient will transfer sit to/from stand: with modified independence Goal: Pt Will Transfer Bed To Chair/Chair To Bed Outcome: Progressing Flowsheets (Taken 01/16/2019 0920) Pt will Transfer Bed to Chair/Chair to Bed: with modified independence Goal: Pt Will Ambulate Outcome: Progressing Flowsheets (Taken 01/16/2019 0920) Pt will Ambulate: 75 feet; with modified independence  Clarene Critchley PT, DPT 9:20 AM, 01/16/19 [phone]

## 2019-01-16 NOTE — TOC Initial Note (Signed)
Transition of Care [facility]) - Initial/Assessment Note    Patient Details  Name: Emily Owen MRN: [id number] Date of Birth: 1929/12/18  Transition of Care [facility]) CM/SW Contact:    Berea Majkowski, Chauncey Reading, RN Phone Number: 01/16/2019, 1:55 PM  Clinical Narrative:   Patient recently moved from [place]. Living with her daughter. Independent, walks with RW. Has continuous oxygen with [employer].  Has been recommended for SNF. She is agreeable. CMS list provided of SNF facilities. Agreeable to referrals in [facility]                Expected Discharge Plan: Skilled Nursing Facility Barriers to Discharge: No Barriers Identified   Patient Goals and CMS Choice Patient states their goals for this hospitalization and ongoing recovery are:: go to rehab for PT and get back home CMS Medicare.gov Compare Post Acute Care list provided to:: Patient Choice offered to / list presented to : Patient  Expected Discharge Plan and Services Expected Discharge Plan: [place]   Discharge Planning Services: CM Consult   Living arrangements for the past 2 months: [place] Expected Discharge Date: 01/17/19                   Prior Living Arrangements/Services Living arrangements for the past 2 months: [place] Lives with:: Adult Children Patient language and need for interpreter reviewed:: No Do you feel safe going back to the place where you live?: Yes      Need for Family Participation in Patient Care: Yes (Comment) Care giver support system in place?: Yes (comment)   Criminal Activity/Legal Involvement Pertinent to Current Situation/Hospitalization: No - Comment as needed  Activities of Daily Living Home Assistive Devices/Equipment: Gilford Rile (specify type) ADL Screening (condition at time of admission) Patient's cognitive ability adequate to safely complete daily activities?: Yes Is the patient deaf or have difficulty hearing?: Yes Does the  patient have difficulty seeing, even when wearing glasses/contacts?: 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) Does the patient have difficulty concentrating, remembering, or making decisions?: No Patient able to express need for assistance with ADLs?: Yes Does the patient have difficulty dressing or bathing?: Yes Independently performs ADLs?: Yes (appropriate for developmental age) Does the patient have difficulty walking or climbing stairs?: Yes Weakness of Legs: Both Weakness of Arms/Hands: None  Permission Sought/Granted   Permission granted to share information with : Yes, Verbal Permission Granted  Share Information with NAME: SNF agencies in [facility]           Emotional Assessment Appearance:: Appears stated age, Well-Groomed Attitude/Demeanor/Rapport: Gracious, Engaged Affect (typically observed): Accepting, Calm Orientation: : Oriented to Self, Oriented to Situation, Oriented to Place, Oriented to  Time      Admission diagnosis:  Pleural effusion, right [J90] Community acquired pneumonia of right lower lobe of lung ([place]) [J18.1] Patient Active Problem List   Diagnosis Date Noted  . Pressure injury of skin 01/15/2019  . Severe sepsis ([place]) 01/15/2019  . RLL pneumonia ([place]) 01/15/2019  . Generalized weakness 01/15/2019  . Left upper lobe pulmonary nodule 01/15/2019  . Pneumonia 01/14/2019  . Raynaud's disease   . Pancreatitis 02/13/2018  . Routine general medical examination at a health care facility 07/19/2017  . Gout 03/14/2016  . Atherosclerosis of aorta ([place]) 06/11/2015  . Diabetes mellitus with peripheral vascular disease ([place])   . Chronic respiratory failure with hypercapnia ([place]) 06/02/2015  . COPD, severe ([place]) 03/25/2015  . Pleural effusion on right 03/25/2015  . COPD with acute exacerbation ([place]) 08/11/2014  .  HTN (hypertension) 08/11/2014  . Acute on chronic respiratory failure with hypoxia ([place]) 08/22/2012  . Hyperlipidemia 08/20/2012   PCP:  [name], MD Pharmacy:   [id number] - [place], [place] [address] Phone: [phone] Fax: [phone]    Readmission Risk Interventions No flowsheet data found.

## 2019-01-16 NOTE — Evaluation (Signed)
Physical Therapy Evaluation Patient Details Name: Emily Owen MRN: [id number] DOB: [birth year] Today's Date: 01/16/2019   History of Present Illness  Emily Owen is a 85 y.o. female with medical history significant for chronic hypoxic respiratory failure in the setting of severe COPD on 3 L continuous supplemental oxygen, type 2 diabetes mellitus, hypertension, who is admitted to [facility] on 01/14/2019 with severe sepsis due to right lower lobe pneumonia after presenting from home to the [facility] ED complaining of shortness of breath.     Clinical Impression  Patient found awake and alert sitting on bedside commode with nurse in room. Upon completing toileting, patient performed stand pivot transfer with a few steps to go to the bedside chair with Min A. Patient was on 3 LPM via nasal Canula of O2 throughout session. Patient reported SOB upon sitting in chair and stated she did not think she could ambulate further at this time. Patient would benefit from continued skilled physical therapy in order to continue progressing activity tolerance, ambulation, strength, and overall functional mobility while in the hospital as well as the at the recommended venue below.     Follow Up Recommendations SNF    Equipment Recommendations       Recommendations for Other Services       Precautions / Restrictions Precautions Precautions: Fall Restrictions Weight Bearing Restrictions: No      Mobility  Bed Mobility Overal bed mobility: (Patient in bedside commode upon entering)                Transfers Overall transfer level: Needs assistance Equipment used: Rolling walker (2 wheeled) Transfers: Stand Pivot Transfers   Stand pivot transfers: Min assist       General transfer comment: Patient performed stand pivot transfer with RW to bedside chair using RW and min A  Ambulation/Gait Ambulation/Gait assistance: Min assist(Limited due to patient reporting  SOB) Gait Distance (Feet): 2 Feet Assistive device: Rolling walker (2 wheeled) Gait Pattern/deviations: Decreased stride length;Shuffle Gait velocity: Decreased velocity   General Gait Details: Patient ambulated with hesitancy and with shuffling gait  Stairs            Wheelchair Mobility    Modified Rankin (Stroke Patients Only)       Balance Overall balance assessment: Needs assistance         Standing balance support: Bilateral upper extremity supported;During functional activity Standing balance-Leahy Scale: Fair Standing balance comment: Patient required bilateral upper extremity support while standing and with functional movement                             Pertinent Vitals/Pain Pain Assessment: No/denies pain    Home Living Family/patient expects to be discharged to:: Private residence Living Arrangements: Children(Daughter) Available Help at Discharge: Family;Available 24 hours/day Type of Home: House Home Access: Ramped entrance     Home Layout: One level Home Equipment: Walker - 2 wheels;Cane - single point;Bedside commode;Grab bars - toilet Additional Comments: Lives with daughter who is supportive and other daughter who works for [employer]    Prior Function Level of Independence: Independent with assistive device(s);Needs assistance   Gait / Transfers Assistance Needed: Uses RW at baseline and stated she is able to walk short distances at home  ADL's / Homemaking Assistance Needed: Patient reported her daughter helps her take a bath once a week        Hand Dominance  Extremity/Trunk Assessment   Upper Extremity Assessment Upper Extremity Assessment: Generalized weakness    Lower Extremity Assessment Lower Extremity Assessment: Generalized weakness    Cervical / Trunk Assessment Cervical / Trunk Assessment: Normal  Communication   Communication: No difficulties  Cognition Arousal/Alertness: Awake/alert Behavior During  Therapy: WFL for tasks assessed/performed Overall Cognitive Status: Within Functional Limits for tasks assessed                                        General Comments      Exercises     Assessment/Plan    PT Assessment Patient needs continued PT services  PT Problem List Decreased strength;Decreased activity tolerance;Decreased balance;Decreased mobility;Cardiopulmonary status limiting activity       PT Treatment Interventions DME instruction;Gait training;Stair training;Functional mobility training;Therapeutic activities;Therapeutic exercise;Balance training;Neuromuscular re-education;Patient/family education    PT Goals (Current goals can be found in the Care Plan section)  Acute Rehab PT Goals Patient Stated Goal: Agreeable to continue therapy at a SNF before returning home PT Goal Formulation: With patient Time For Goal Achievement: 01/23/19 Potential to Achieve Goals: Good    Frequency Min 3X/week   Barriers to discharge        Co-evaluation               AM-PAC PT "6 Clicks" Mobility  Outcome Measure Help needed turning from your back to your side while in a flat bed without using bedrails?: A Little Help needed moving from lying on your back to sitting on the side of a flat bed without using bedrails?: A Lot Help needed moving to and from a bed to a chair (including a wheelchair)?: A Little Help needed standing up from a chair using your arms (e.g., wheelchair or bedside chair)?: A Little Help needed to walk in hospital room?: A Lot Help needed climbing 3-5 steps with a railing? : A Lot 6 Click Score: 15    End of Session Equipment Utilized During Treatment: Oxygen(3 LPM) Activity Tolerance: Patient limited by fatigue Patient left: in chair;with call bell/phone within reach;with chair alarm set Nurse Communication: Mobility status PT Visit Diagnosis: Unsteadiness on feet (R26.81);Other abnormalities of gait and mobility (R26.89);Muscle  weakness (generalized) (M62.81)    Time: 3762-8315 PT Time Calculation (min) (ACUTE ONLY): 15 min   Charges:   PT Evaluation $PT Eval Low Complexity: 1 Low        Clarene Critchley PT, DPT 9:19 AM, 01/16/19 [phone]

## 2019-01-16 NOTE — NC FL2 (Signed)
[place] LEVEL OF CARE SCREENING TOOL     IDENTIFICATION  Patient Name: Emily Owen Birthdate: 1930-03-02 Sex: female Admission Date (Current Location): 01/14/2019  [facility] and [place] Number:  [employer] and Address:  [place] [address] [place]      Provider Number: [id number]  Attending Physician Name and Address:  Rodena Goldmann, DO  Relative Name and Phone Number:  Orson Ape [phone]    Current Level of Care: Hospital Recommended Level of Care: [place] Prior Approval Number:    Date Approved/Denied: 01/16/19 PASRR Number: [id number]  Discharge Plan: SNF    Current Diagnoses: Patient Active Problem List   Diagnosis Date Noted  . Pressure injury of skin 01/15/2019  . Severe sepsis ([place]) 01/15/2019  . RLL pneumonia ([place]) 01/15/2019  . Generalized weakness 01/15/2019  . Left upper lobe pulmonary nodule 01/15/2019  . Pneumonia 01/14/2019  . Raynaud's disease   . Pancreatitis 02/13/2018  . Routine general medical examination at a health care facility 07/19/2017  . Gout 03/14/2016  . Atherosclerosis of aorta ([place]) 06/11/2015  . Diabetes mellitus with peripheral vascular disease ([place])   . Chronic respiratory failure with hypercapnia ([place]) 06/02/2015  . COPD, severe ([place]) 03/25/2015  . Pleural effusion on right 03/25/2015  . COPD with acute exacerbation ([place]) 08/11/2014  . HTN (hypertension) 08/11/2014  . Acute on chronic respiratory failure with hypoxia ([place]) 08/22/2012  . Hyperlipidemia 08/20/2012    Orientation RESPIRATION BLADDER Height & Weight     Self, Time, Situation  O2 Continent Weight: 78.4 kg Height:  5\' 4"  (162.6 cm)  BEHAVIORAL SYMPTOMS/MOOD NEUROLOGICAL BOWEL NUTRITION STATUS      Continent Diet(carb modified)  AMBULATORY STATUS COMMUNICATION OF NEEDS Skin   Extensive Assist Verbally PU Stage and Appropriate Care                        Personal Care Assistance Level of Assistance  Bathing, Dressing, Feeding Bathing Assistance: Limited assistance Feeding assistance: Independent Dressing Assistance: Limited assistance     Functional Limitations Info  Sight, Hearing, Speech Sight Info: Adequate Hearing Info: Adequate Speech Info: Adequate    SPECIAL CARE FACTORS FREQUENCY  PT (By licensed PT)     PT Frequency: 5x/week              Contractures Contractures Info: Not present    Additional Factors Info  Code Status, Allergies, Insulin Sliding Scale Code Status Info: DNR Allergies Info: No Known Allergies   Insulin Sliding Scale Info: on sliding scale currently-see DC summary       Current Medications (01/16/2019):  This is the current hospital active medication list Current Facility-Administered Medications  Medication Dose Route Frequency Provider Last Rate Last Dose  . 0.9 %  sodium chloride infusion   Intravenous Continuous Howerter, Justin B, DO 125 mL/hr at 01/16/19 8127    . acetaminophen (TYLENOL) tablet 650 mg  650 mg Oral Q6H PRN Howerter, Justin B, DO       Or  . acetaminophen (TYLENOL) suppository 650 mg  650 mg Rectal Q6H PRN Howerter, Justin B, DO      . amLODipine (NORVASC) tablet 5 mg  5 mg Oral Daily Manuella Ghazi, Pratik D, DO   5 mg at 01/16/19 0956  . azithromycin (ZITHROMAX) 500 mg in sodium chloride 0.9 % 250 mL IVPB  500 mg Intravenous Q24H Manuella Ghazi, Pratik D, DO 250 mL/hr at 01/15/19 1610 500 mg  at 01/15/19 1610  . chlorhexidine (PERIDEX) 0.12 % solution 15 mL  15 mL Mouth Rinse BID Howerter, Justin B, DO   15 mL at 01/16/19 0957  . enoxaparin (LOVENOX) injection 40 mg  40 mg Subcutaneous Q24H Howerter, Justin B, DO   40 mg at 01/15/19 2121  . insulin aspart (novoLOG) injection 0-15 Units  0-15 Units Subcutaneous TID WC Howerter, Justin B, DO   3 Units at 01/16/19 1250  . insulin aspart (novoLOG) injection 0-5 Units  0-5 Units Subcutaneous QHS Howerter, Justin B, DO   3 Units at 01/15/19 2126   . ipratropium-albuterol (DUONEB) 0.5-2.5 (3) MG/3ML nebulizer solution 3 mL  3 mL Nebulization Q4H PRN Manuella Ghazi, Pratik D, DO   3 mL at 01/16/19 1335  . MEDLINE mouth rinse  15 mL Mouth Rinse q12n4p Howerter, Justin B, DO   15 mL at 01/16/19 1250  . piperacillin-tazobactam (ZOSYN) IVPB 3.375 g  3.375 g Intravenous Q8H Howerter, Justin B, DO 12.5 mL/hr at 01/16/19 0541 3.375 g at 01/16/19 0541  . predniSONE (DELTASONE) tablet 40 mg  40 mg Oral Q breakfast Heath Lark D, DO   40 mg at 01/16/19 0956  . rosuvastatin (CRESTOR) tablet 20 mg  20 mg Oral q1800 Howerter, Justin B, DO   20 mg at 01/15/19 1715     Discharge Medications: Please see discharge summary for a list of discharge medications.  Relevant Imaging Results:  Relevant Lab Results:   Additional Information SSN # [id number]  Jodene Polyak, Chauncey [place], RN

## 2019-01-16 NOTE — Progress Notes (Addendum)
Inpatient Diabetes Program Recommendations  AACE/ADA: New Consensus Statement on Inpatient Glycemic Control (2015)  Target Ranges:  Prepandial:   less than 140 mg/dL      Peak postprandial:   less than 180 mg/dL (1-2 hours)      Critically ill patients:  140 - 180 mg/dL   Lab Results  Component Value Date   GLUCAP 212 (H) 01/16/2019   HGBA1C 6.3 (H) 02/26/2018    Review of Glycemic Control Results for Emily Owen, Emily Owen (MRN [id number]) as of 01/16/2019 09:58  Ref. Range 01/15/2019 10:57 01/15/2019 16:32 01/15/2019 19:21 01/15/2019 21:24 01/16/2019 07:37  Glucose-Capillary Latest Ref Range: 70 - 99 mg/dL 261 (H) 284 (H) 288 (H) 263 (H) 212 (H)   Diabetes history: DM2 Outpatient Diabetes medications: None Current orders for Inpatient glycemic control: Novolog moderate correction tid + hs 0-5 units  Inpatient Diabetes Program Recommendations:   CBGs consistently >200 and noted  Now Prednisone 40 mg qd -Consider adding Novolog 4 units tid meal coverage if eats 50% while CBGs elevated and adjust as needed  Thank you, Bethena Roys E. Kardell Virgil, RN, MSN, CDE  Diabetes Coordinator Inpatient Glycemic Control Team Team Pager [phone] (8am-5pm) 01/16/2019 10:01 AM

## 2019-01-17 ENCOUNTER — Inpatient Hospital Stay
Admission: RE | Admit: 2019-01-17 | Discharge: 2019-01-24 | Disposition: A | Discharge status: Home / Self Care | Payer: Medicare HMO | Source: Ambulatory Visit | Attending: Internal Medicine | Admitting: Internal Medicine

## 2019-01-17 DIAGNOSIS — A419 Sepsis, unspecified organism: Secondary | ICD-10-CM | POA: Diagnosis not present

## 2019-01-17 DIAGNOSIS — E785 Hyperlipidemia, unspecified: Secondary | ICD-10-CM | POA: Diagnosis not present

## 2019-01-17 DIAGNOSIS — J9611 Chronic respiratory failure with hypoxia: Secondary | ICD-10-CM | POA: Diagnosis not present

## 2019-01-17 DIAGNOSIS — J181 Lobar pneumonia, unspecified organism: Secondary | ICD-10-CM | POA: Diagnosis not present

## 2019-01-17 DIAGNOSIS — M109 Gout, unspecified: Secondary | ICD-10-CM | POA: Diagnosis not present

## 2019-01-17 DIAGNOSIS — E1159 Type 2 diabetes mellitus with other circulatory complications: Secondary | ICD-10-CM | POA: Diagnosis not present

## 2019-01-17 DIAGNOSIS — J189 Pneumonia, unspecified organism: Secondary | ICD-10-CM | POA: Diagnosis not present

## 2019-01-17 DIAGNOSIS — J449 Chronic obstructive pulmonary disease, unspecified: Secondary | ICD-10-CM | POA: Diagnosis not present

## 2019-01-17 DIAGNOSIS — I10 Essential (primary) hypertension: Secondary | ICD-10-CM | POA: Diagnosis not present

## 2019-01-17 DIAGNOSIS — I7300 Raynaud's syndrome without gangrene: Secondary | ICD-10-CM | POA: Diagnosis not present

## 2019-01-17 DIAGNOSIS — R6520 Severe sepsis without septic shock: Secondary | ICD-10-CM | POA: Diagnosis not present

## 2019-01-17 DIAGNOSIS — R69 Illness, unspecified: Secondary | ICD-10-CM | POA: Diagnosis not present

## 2019-01-17 DIAGNOSIS — J90 Pleural effusion, not elsewhere classified: Secondary | ICD-10-CM | POA: Diagnosis not present

## 2019-01-17 DIAGNOSIS — E1151 Type 2 diabetes mellitus with diabetic peripheral angiopathy without gangrene: Secondary | ICD-10-CM | POA: Diagnosis not present

## 2019-01-17 DIAGNOSIS — M810 Age-related osteoporosis without current pathological fracture: Secondary | ICD-10-CM | POA: Diagnosis not present

## 2019-01-17 DIAGNOSIS — R911 Solitary pulmonary nodule: Secondary | ICD-10-CM | POA: Diagnosis not present

## 2019-01-17 LAB — GLUCOSE, CAPILLARY: Glucose-Capillary: 124 mg/dL — ABNORMAL HIGH (ref 70–99)

## 2019-01-17 LAB — BASIC METABOLIC PANEL
Anion gap: 10 (ref 5–15)
BUN: 21 mg/dL (ref 8–23)
CO2: 31 mmol/L (ref 22–32)
Calcium: 6.9 mg/dL — ABNORMAL LOW (ref 8.9–10.3)
Chloride: 100 mmol/L (ref 98–111)
Creatinine, Ser: 0.92 mg/dL (ref 0.44–1.00)
GFR calc Af Amer: >60 mL/min (ref >60)
GFR calc non Af Amer: 56 mL/min — ABNORMAL LOW (ref >60)
Glucose, Bld: 141 mg/dL — ABNORMAL HIGH (ref 70–99)
Potassium: 3.5 mmol/L (ref 3.5–5.1)
Sodium: 141 mmol/L (ref 135–145)

## 2019-01-17 LAB — CBC
HCT: 33.2 % — ABNORMAL LOW (ref 36.0–46.0)
Hemoglobin: 9.8 g/dL — ABNORMAL LOW (ref 12.0–15.0)
MCH: 27.1 pg (ref 26.0–34.0)
MCHC: 29.5 g/dL — ABNORMAL LOW (ref 30.0–36.0)
MCV: 91.7 fL (ref 80.0–100.0)
Platelets: 251 10*3/uL (ref 150–400)
RBC: 3.62 MIL/uL — AB (ref 3.87–5.11)
RDW: 17.6 % — ABNORMAL HIGH (ref 11.5–15.5)
WBC: 11.8 10*3/uL — ABNORMAL HIGH (ref 4.0–10.5)
nRBC: 0 % (ref 0.0–0.2)

## 2019-01-17 LAB — LEGIONELLA PNEUMOPHILA SEROGP 1 UR AG: L. pneumophila Serogp 1 Ur Ag: NEGATIVE

## 2019-01-17 LAB — LACTIC ACID, PLASMA: Lactic Acid, Venous: 2 mmol/L (ref 0.5–1.9)

## 2019-01-17 MED ORDER — PREDNISONE 20 MG PO TABS: 40.0000 mg | ORAL_TABLET | Freq: Every day | ORAL | 0 refills | Status: DC

## 2019-01-17 MED ORDER — AMOXICILLIN-POT CLAVULANATE 875-125 MG PO TABS: 1.0000 | ORAL_TABLET | Freq: Two times a day (BID) | ORAL | 0 refills | Status: AC

## 2019-01-17 MED ORDER — AMOXICILLIN-POT CLAVULANATE 875-125 MG PO TABS: 1.0000 | ORAL_TABLET | Freq: Two times a day (BID) | ORAL | 0 refills | Status: DC

## 2019-01-17 MED ORDER — PREDNISONE 20 MG PO TABS: 40.0000 mg | ORAL_TABLET | Freq: Every day | ORAL | 0 refills | Status: AC

## 2019-01-17 NOTE — TOC Transition Note (Signed)
Transition of Care [facility]) - CM/SW Discharge Note   Patient Details  Name: Emily Owen MRN: [id number] Date of Birth: Feb 11, 1930  [facility] CM/SW Contact:  Zettie Pho, LCSW Phone Number: 01/17/2019, 10:43 AM   Clinical Narrative:   The CSW contacted [facility] to confirm that the patient can transfer, today. The acting supervisor confirmed such and that they have access to the [facility]. The CSW has sent the discharge summary via [place]. The CSW contacted the patient's daughter, [name], who asked if she is supposed to pick up the filled prescription from [facility] to bring to the facility. The CSW confirmed with the patient's attending RN that is the case. The CSW called Ms. Jessee Avers back to advise to pick up the prescription and to provide the contact information for the facility for additional questions regarding the ability to bring food for the patient.   The CSW has completed TOC documentation and is signing off. Please consult should needs arise.    Final next level of care: [place] Barriers to Discharge: No Barriers Identified   Patient Goals and CMS Choice Patient states their goals for this hospitalization and ongoing recovery are:: "I want to get back home and be able to walk like before." CMS Medicare.gov Compare Post Acute Care list provided to:: Patient Choice offered to / list presented to : Patient  Discharge Placement   Existing PASRR number confirmed : 01/17/19          Patient chooses bed at: [facility] Patient to be transferred to facility by: [facility] Name of family member notified: Audrie Lia Patient and family notified of of transfer: 01/17/19  Discharge Plan and Services   Discharge Planning Services: CM Consult                      Social Determinants of Health (SDOH) Interventions     Readmission Risk Interventions No flowsheet data found.

## 2019-01-17 NOTE — Progress Notes (Signed)
IV removed by Lovena Le, NT, patient tolerated well.  Report called to Renea, LPN at [facility] and all questions answered.  Patient's daughter Orson Ape made aware that patient was being discharged to the [facility] and she verbalized understanding.

## 2019-01-17 NOTE — Progress Notes (Signed)
CRITICAL VALUE ALERT  Critical Value:  Lactic acid 2.0  Date & Time Notied:  01/17/2019 0721  Provider Notified: Dr. Manuella Ghazi   Orders Received/Actions taken: awaiting orders/callback

## 2019-01-17 NOTE — Discharge Summary (Signed)
Physician Discharge Summary  Emily Owen:[id number] DOB: 01-29-1930 DOA: 01/14/2019  PCP: [name], MD  Admit date: 01/14/2019  Discharge date: 01/17/2019  Admitted From:Home    Disposition:  SNF  Recommendations for Outpatient Follow-up:  1. Follow up with PCP in 1-2 weeks 2. Continue on Augmentin as prescribed for 4 more days to finish course of treatment. 3. Continue on prednisone for 4 more days to finish course of treatment. 4. Recommend follow-up CBC/lactic acid in 3 to 5 days.  Home Health: None  Equipment/Devices: Chronic 3 L home O2 nasal cannula  Discharge Condition: Stable  CODE STATUS: DNR  Diet recommendation: Heart Healthy/carb modified  Brief/Interim Summary: Per HPI: Emily Kershaw Morrisonis a 85 y.o.femalewith medical history significant forchronic hypoxic respiratory failure in the setting of severe COPD on 3 L continuous supplemental oxygen, type 2 diabetes mellitus, hypertension, who is admitted [facility] on3/25/2020 with severe sepsis due to right lower lobe pneumonia [facility].  The patient reports 3 to 4 days of progressive shortness of breath associated with new onset nonproductive cough and wheezing.She also noted generalized weakness over that timeframe in the absence of any acute focal weakness.She denies any associated subjective fever, rigors, generalized myalgias, or rash. She reports that her shortness of breath has not been associated with any orthopnea, PND, or peripheral edema.Denies any associated chest pain, palpitations, diaphoresis, dizziness,or hemoptysis.She experienced some nausea approximately 2 days ago, but states that this was not associated with any vomiting, and has independently resolved in the interval. She denies any abdominal pain or diarrhea. Denies any recent traveling or known sick contacts,including noknownexposure  toCOVID-19 cases. Denies any recent trauma. Denies any associated rhinitis, rhinorrhea, or sore throat. In the setting of the above symptoms, the patient saw her PCP on 01/12/2019, who diagnosed her with an acute COPD exacerbation at that time.Consequently, she was prescribed a prednisone taper by her PCP, but has not experienced any improvement in her breathing following 2 subsequent doses of prednisone 40 mg,prompting her to present to the emergency department today for further evaluation of her ongoing shortness of breath.  Patient was admitted with severe sepsis due to right lower lobe pneumonia along with associated acute on chronic hypoxemic respiratory failure and acute COPD exacerbation.  She was treated with IV Zosyn and azithromycin while here but can be transitioned to oral Augmentin at this time for 4 more days to finish a 7-day total course of treatment.  She has also had vast improvements in her shortness of breath and wheezing and her COPD exacerbation has resolved.  She will remain on duo nebs as needed as well as oral prednisone for 4 more days.  She has been seen by physical therapy on 3/27 with recommendations for discharge to skilled nursing facility at this time for aggressive rehabilitation.  She is agreeable to this plan and has had no other acute events or concerns during the course of this brief admission.  She is otherwise stable for discharge today.  Notably, her lactic acid on day of discharge is 2, but has been downtrending.  Clinically she is much improved and there are no other signs of endorgan injury.  Discharge Diagnoses:  Principal Problem:   Severe sepsis ([place]) Active Problems:   Acute on chronic respiratory failure with hypoxia (HCC)   COPD with acute exacerbation (HCC)   HTN (hypertension)   Diabetes mellitus with peripheral vascular disease (HCC)   Pressure  injury of skin   RLL pneumonia (HCC)   Generalized weakness   Left upper lobe pulmonary  nodule  Principal discharge diagnosis: Severe sepsis secondary to right lower lobe pneumonia with associated COPD exacerbation.  Discharge Instructions  Discharge Instructions    Diet - low sodium heart healthy   Complete by:  As directed    Increase activity slowly   Complete by:  As directed      Allergies as of 01/17/2019   No Known Allergies     Medication List    STOP taking these medications   levofloxacin 500 MG tablet Commonly known as:  LEVAQUIN     TAKE these medications   albuterol (2.5 MG/3ML) 0.083% nebulizer solution Commonly known as:  PROVENTIL Take 2.5 mg by nebulization every 4 (four) hours as needed for wheezing or shortness of breath.   alendronate 70 MG tablet Commonly known as:  FOSAMAX TAKE 1 TABLET BY MOUTH EVERY 7 DAYS. TAKE WITH A FULL GLASS OF WATER ON AN EMPTY STOMACH.   amLODipine 5 MG tablet Commonly known as:  NORVASC Take 1 tablet (5 mg total) by mouth daily.   amoxicillin-clavulanate 875-125 MG tablet Commonly known as:  Augmentin Take 1 tablet by mouth 2 (two) times daily for 4 days.   Anoro Ellipta 62.5-25 MCG/INH Aepb Generic drug:  umeclidinium-vilanterol TAKE 1 PUFF BY MOUTH EVERY DAY   Colchicine 0.6 MG Caps 1.2 mg orally at the first sign of a flare followed by 0.6 mg one hour later; MAX 1.8 mg over 1 hour  Then 0.6 BID as needed for gout pain   Combivent Respimat 20-100 MCG/ACT Aers respimat Generic drug:  Ipratropium-Albuterol Inhale 1 puff into the lungs every 6 (six) hours as needed for wheezing.   Flutter Devi Use as directed.   gabapentin 300 MG capsule Commonly known as:  NEURONTIN Take 2 capsules (600 mg total) by mouth at bedtime.   OXYGEN Inhale 3 L into the lungs continuous. [employer]   predniSONE 20 MG tablet Commonly known as:  DELTASONE Take 2 tablets (40 mg total) by mouth daily with breakfast for 4 days.   rosuvastatin 20 MG tablet Commonly known as:  Crestor Take 1 tablet (20 mg total) by mouth  daily.       Contact information for follow-up providers    [name], MD Follow up in 2 week(s).   Specialty:  Family Medicine Contact information: [address] [place] [address] [phone]            Contact information for after-discharge care    [facility] .   Service:  Skilled Nursing Contact information: [address] [place] [phone]                 No Known Allergies  Consultations:  None   Procedures/Studies: Dg Chest 2 View  Result Date: 01/14/2019 CLINICAL DATA:  Cough and shortness of breath for 2 weeks. EXAM: CHEST - 2 VIEW COMPARISON:  01/13/2019 FINDINGS: Cardiomediastinal silhouette is normal. Mediastinal contours appear intact. Calcific atherosclerotic disease of the aorta. Persistent nodular airspace consolidation versus subpleural thickening/pulmonary nodule in the right lower lobe, and right subhilar region. Persistent right pleural effusion. Newly appreciated left pleural/subpleural thickening. Osseous structures are without acute abnormality. Soft tissues are grossly normal. IMPRESSION: 1. Persistent nodular airspace consolidation versus subpleural thickening/pulmonary nodule in the right lower lobe and right subhilar region. 2. Persistent right  pleural effusion. 3. Newly appreciated left pleural/subpleural thickening. Electronically Signed   By: Fidela Salisbury M.D.   On: 01/14/2019 13:11   Dg Chest 2 View  Result Date: 01/13/2019 CLINICAL DATA:  Diminished breath sounds bilaterally. EXAM: CHEST - 2 VIEW COMPARISON:  10/01/2018 FINDINGS: Cardiomediastinal silhouette is normal. Mediastinal contours appear intact. Persistent right pleural effusion with right lower lobe atelectasis versus airspace consolidation. Fullness in the right subhilar region. Osseous structures are without acute abnormality. Soft tissues are grossly normal. IMPRESSION: 1.  Persistent right pleural effusion with right lower lobe atelectasis versus airspace consolidation. 2. Fullness in the right subhilar region may represent overlapping vascular shadows, lymphadenopathy or potentially a pulmonary mass. Electronically Signed   By: Fidela Salisbury M.D.   On: 01/13/2019 16:29   Ct Chest Wo Contrast  Result Date: 01/14/2019 CLINICAL DATA:  Shortness of breath and cough. Rule out pneumonia. Fall 1 week ago. EXAM: CT CHEST WITHOUT CONTRAST TECHNIQUE: Multidetector CT imaging of the chest was performed following the standard protocol without IV contrast. COMPARISON:  Chest x-rays January 14, 2019 and January 13, 2019. FINDINGS: Cardiovascular: Coronary artery calcifications involve the right and left coronary arteries. The heart size is normal. The thoracic aorta demonstrates atherosclerotic change without aneurysmal dilatation. The main pulmonary artery is normal in appearance. Mediastinum/Nodes: A right thyroid lobe nodule measures 18 mm. The thyroid is otherwise normal. There is a right-sided pleural effusion. No left-sided pleural effusion or pericardial effusion. The visualized esophagus is unremarkable. No adenopathy in the chest. Lungs/Pleura: There is mild mucus in the trachea. There is debris in the right bronchus intermedius and right lower lobe bronchus. There is opacification of some medial left lower lobe bronchi as well, likely filled with debris. No pneumothorax. There is a nodule in the left upper lobe measuring 8 by 10 by 7 mm with a mean diameter of 8 mm. No other nodules or masses. Mild emphysematous changes in the lungs. The left lung is otherwise unremarkable. The findings on today's chest x-ray not seen on today's CT scan including the scout view. There is a right-sided pleural effusion with underlying opacity. No other abnormalities in either lung. Upper Abdomen: Cholelithiasis is identified. The upper abdomen is otherwise unremarkable. Musculoskeletal: No chest  wall mass or suspicious bone lesions identified. IMPRESSION: 1. There is a nodule in the left upper lobe with a mean diameter of 8 mm. Non-contrast chest CT at 6-12 months is recommended. If the nodule is stable at time of repeat CT, then future CT at 18-24 months (from today's scan) is considered optional for low-risk patients, but is recommended for high-risk patients. This recommendation follows the consensus statement: Guidelines for Management of Incidental Pulmonary Nodules Detected on CT Images: From the Fleischner Society 2017; Radiology 2017; 284:228-243. 2. There is a small to moderate right-sided pleural effusion with underlying opacity. The underlying opacity may simply represent atelectasis. 3. Debris in the bronchus intermedius and bilateral lower lobe bronchi consistent with aspirated material. 4. Atherosclerotic changes in the nonaneurysmal aorta. Coronary artery calcifications. 5. Cholelithiasis. 6. 18 mm right thyroid lobe nodule. A thyroid ultrasound could better evaluate. Aortic Atherosclerosis (ICD10-I70.0) and Emphysema (ICD10-J43.9). Electronically Signed   By: Dorise Bullion III M.D   On: 01/14/2019 16:28    Discharge Exam: Vitals:   01/16/19 2132 01/17/19 0516  BP: (!) 145/72 130/75  Pulse: 89 75  Resp: 20 18  Temp: 98.2 F (36.8 C) 97.6 F (36.4 C)  SpO2: 97% 97%   Vitals:   01/16/19  1335 01/16/19 1455 01/16/19 2132 01/17/19 0516  BP:  (!) 123/59 (!) 145/72 130/75  Pulse:  87 89 75  Resp:  19 20 18   Temp:  98.2 F (36.8 C) 98.2 F (36.8 C) 97.6 F (36.4 C)  TempSrc:  Oral Oral Oral  SpO2: 95% 99% 97% 97%  Weight:    83.7 kg  Height:        General: Pt is alert, awake, not in acute distress Cardiovascular: RRR, S1/S2 +, no rubs, no gallops Respiratory: CTA bilaterally, no wheezing, no rhonchi, currently on 3 L nasal cannula. Abdominal: Soft, NT, ND, bowel sounds + Extremities: no edema, no cyanosis    The results of significant diagnostics from this  hospitalization (including imaging, microbiology, ancillary and laboratory) are listed below for reference.     Microbiology: Recent Results (from the past 240 hour(s))  Culture, blood (routine x 2)     Status: None (Preliminary result)   Collection Time: 01/14/19 12:45 PM  Result Value Ref Range Status   Specimen Description BLOOD RIGHT ANTECUBITAL  Final   Special Requests   Final    BOTTLES DRAWN AEROBIC AND ANAEROBIC Blood Culture results may not be optimal due to an inadequate volume of blood received in culture bottles   Culture   Final    NO GROWTH 3 DAYS Performed at [facility], [address] [place], [place] [address]    Report Status PENDING  Incomplete  Culture, blood (routine x 2)     Status: None (Preliminary result)   Collection Time: 01/14/19  7:01 PM  Result Value Ref Range Status   Specimen Description BLOOD RIGHT ANTECUBITAL  Final   Special Requests   Final    BOTTLES DRAWN AEROBIC AND ANAEROBIC Blood Culture adequate volume   Culture   Final    NO GROWTH 3 DAYS Performed at [facility], [address], [place], [place] [address]    Report Status PENDING  Incomplete  MRSA PCR Screening     Status: None   Collection Time: 01/14/19  8:05 PM  Result Value Ref Range Status   MRSA by PCR NEGATIVE NEGATIVE Final    Comment:        The GeneXpert MRSA Assay (FDA approved for NASAL specimens only), is one component of a comprehensive MRSA colonization surveillance program. It is not intended to diagnose MRSA infection nor to guide or monitor treatment for MRSA infections. Performed at [facility], [address], [place],  [address]      Labs: BNP (last 3 results) Recent Labs    01/14/19 1230  BNP 23.7   Basic Metabolic Panel: Recent Labs  Lab 01/14/19 1230 01/15/19 0427 01/16/19 0423 01/17/19 0611  NA 133* 140 141 141  K 4.7 4.5 4.3 3.5  CL 87* 95* 100 100  CO2 32 33* 32 31  GLUCOSE 340* 301* 242* 141*  BUN 38* 31* 29* 21   CREATININE 1.15* 0.99 0.93 0.92  CALCIUM 8.7* 7.9* 7.4* 6.9*  MG 1.8 1.8  --   --    Liver Function Tests: Recent Labs  Lab 01/14/19 1230  AST 36  ALT 74*  ALKPHOS 75  BILITOT 0.7  PROT 6.5  ALBUMIN 3.7   No results for input(s): LIPASE, AMYLASE in the last 168 hours. No results for input(s): AMMONIA in the last 168 hours. CBC: Recent Labs  Lab 01/14/19 1230 01/15/19 0427 01/16/19 0423 01/17/19 0611  WBC 15.6* 8.7 12.4* 11.8*  NEUTROABS 14.5* 8.0*  --   --  HGB 11.1* 10.3* 9.8* 9.8*  HCT 36.8 35.3* 33.8* 33.2*  MCV 91.8 91.9 92.9 91.7  PLT 205 185 227 251   Cardiac Enzymes: No results for input(s): CKTOTAL, CKMB, CKMBINDEX, TROPONINI in the last 168 hours. BNP: Invalid input(s): POCBNP CBG: Recent Labs  Lab 01/16/19 0737 01/16/19 1134 01/16/19 1753 01/16/19 2103 01/17/19 0726  GLUCAP 212* 152* 268* 235* 124*   D-Dimer No results for input(s): DDIMER in the last 72 hours. Hgb A1c No results for input(s): HGBA1C in the last 72 hours. Lipid Profile No results for input(s): CHOL, HDL, LDLCALC, TRIG, CHOLHDL, LDLDIRECT in the last 72 hours. Thyroid function studies No results for input(s): TSH, T4TOTAL, T3FREE, THYROIDAB in the last 72 hours.  Invalid input(s): FREET3 Anemia work up No results for input(s): VITAMINB12, FOLATE, FERRITIN, TIBC, IRON, RETICCTPCT in the last 72 hours. Urinalysis    Component Value Date/Time   COLORURINE STRAW (A) 01/15/2019 0410   APPEARANCEUR CLEAR 01/15/2019 0410   LABSPEC 1.010 01/15/2019 0410   PHURINE 6.0 01/15/2019 0410   GLUCOSEU >=500 (A) 01/15/2019 0410   HGBUR NEGATIVE 01/15/2019 0410   BILIRUBINUR NEGATIVE 01/15/2019 0410   KETONESUR NEGATIVE 01/15/2019 0410   PROTEINUR NEGATIVE 01/15/2019 0410   UROBILINOGEN 0.2 07/11/2015 1149   NITRITE NEGATIVE 01/15/2019 0410   LEUKOCYTESUR NEGATIVE 01/15/2019 0410   Sepsis Labs Invalid input(s): PROCALCITONIN,  WBC,  LACTICIDVEN Microbiology Recent Results (from  the past 240 hour(s))  Culture, blood (routine x 2)     Status: None (Preliminary result)   Collection Time: 01/14/19 12:45 PM  Result Value Ref Range Status   Specimen Description BLOOD RIGHT ANTECUBITAL  Final   Special Requests   Final    BOTTLES DRAWN AEROBIC AND ANAEROBIC Blood Culture results may not be optimal due to an inadequate volume of blood received in culture bottles   Culture   Final    NO GROWTH 3 DAYS Performed at [facility], [address], [place], [place] [address]    Report Status PENDING  Incomplete  Culture, blood (routine x 2)     Status: None (Preliminary result)   Collection Time: 01/14/19  7:01 PM  Result Value Ref Range Status   Specimen Description BLOOD RIGHT ANTECUBITAL  Final   Special Requests   Final    BOTTLES DRAWN AEROBIC AND ANAEROBIC Blood Culture adequate volume   Culture   Final    NO GROWTH 3 DAYS Performed at [facility], [address], [place], [place] [address]    Report Status PENDING  Incomplete  MRSA PCR Screening     Status: None   Collection Time: 01/14/19  8:05 PM  Result Value Ref Range Status   MRSA by PCR NEGATIVE NEGATIVE Final    Comment:        The GeneXpert MRSA Assay (FDA approved for NASAL specimens only), is one component of a comprehensive MRSA colonization surveillance program. It is not intended to diagnose MRSA infection nor to guide or monitor treatment for MRSA infections. Performed at [facility], [address], [place], [place] [address]      Time coordinating discharge: 35 minutes  SIGNED:   Rodena Goldmann, DO [facility] 01/17/2019, 8:47 AM  If 7PM-7AM, please contact night-coverage [email or web address] Password [email or web address]

## 2019-01-19 ENCOUNTER — Non-Acute Institutional Stay (SKILLED_NURSING_FACILITY): Payer: Medicare HMO | Admitting: Internal Medicine

## 2019-01-19 ENCOUNTER — Encounter: Payer: Self-pay | Admitting: Internal Medicine

## 2019-01-19 DIAGNOSIS — E785 Hyperlipidemia, unspecified: Secondary | ICD-10-CM

## 2019-01-19 DIAGNOSIS — J449 Chronic obstructive pulmonary disease, unspecified: Secondary | ICD-10-CM | POA: Diagnosis not present

## 2019-01-19 DIAGNOSIS — J189 Pneumonia, unspecified organism: Secondary | ICD-10-CM

## 2019-01-19 DIAGNOSIS — I10 Essential (primary) hypertension: Secondary | ICD-10-CM | POA: Diagnosis not present

## 2019-01-19 DIAGNOSIS — E1151 Type 2 diabetes mellitus with diabetic peripheral angiopathy without gangrene: Secondary | ICD-10-CM | POA: Diagnosis not present

## 2019-01-19 DIAGNOSIS — J181 Lobar pneumonia, unspecified organism: Secondary | ICD-10-CM | POA: Diagnosis not present

## 2019-01-19 DIAGNOSIS — J90 Pleural effusion, not elsewhere classified: Secondary | ICD-10-CM

## 2019-01-19 DIAGNOSIS — M109 Gout, unspecified: Secondary | ICD-10-CM

## 2019-01-19 LAB — CULTURE, BLOOD (ROUTINE X 2)
Culture: NO GROWTH
Culture: NO GROWTH
Special Requests: ADEQUATE

## 2019-01-19 NOTE — Progress Notes (Signed)
Provider:  Veleta Miners, MD Location:  [facility] Room Number: 132 P Place of Service:  SNF ([phone])  PCP: [name], MD Patient Care Team: [name], MD as PCP - General (Family Medicine)  Extended Emergency Contact Information Primary Emergency Contact: Wandra Arthurs [place] Phone: [phone] Mobile Phone: [phone] Relation: Daughter Secondary Emergency Contact: Paula,Emily Address: [place], [place] Phone: [phone] Work Phone: [phone] Mobile Phone: [phone] Relation: Daughter  Code Status: DNR Goals of Care: Advanced Directive information Advanced Directives 01/19/2019  Does Patient Have a Medical Advance Directive? Yes  Type of Advance Directive Out of facility DNR (pink MOST or yellow form)  Does patient want to make changes to medical advance directive? No - Patient declined  Copy of [place] in Chart? -  Would patient like information on creating a medical advance directive? No - Patient declined  Pre-existing out of facility DNR order (yellow form or pink MOST form) -      Chief Complaint  Patient presents with   New Admit To SNF    Admission    HPI: Patient is a 85 y.o. female seen today for admission to SNF for therapy Patient was in the hospital from 03/25- 03/28 for Right Lower Lobe Pneumonia, COPD and Hypoxia  Patient has a past medical history of COPD on 3 L of oxygen, type 2 diabetes, hypertension, osteoporosis, hypertension, PAD, hyperlipidemia, chronic diarrhea and recent Left Distal Radius fracture due to mechanical Fall in 01/19  She was getting outpatient treatment for her COPD with antibiotics and prednisone.  But patient continued to get weak, hypoxic.  She had a chest x-ray done which showed right lower lobe pneumonia.  Was treated with Zosyn and azithromycin and was changed to oral Augmentin to  finish a 7-day course. She also received prednisone taper. Patient improved but continued to be weak and so it was decided to send her to SNF for rehab. Patient says she she is back to baseline with her breathing.  She has shortness of breath with mild exertion.is on Chronic Oxygen  She does have cough.  Denies any fever or chills or chest pain. Patient lives with her daughter who is there 24-hour caregiver.  She said that she does not ambulate a lot at home and is dependent for her ADLs.  Past Medical History:  Diagnosis Date   Cancer [facility])    colon   Cancer of cervix ([place])    cervix   COPD (chronic obstructive pulmonary disease) ([place])    Diabetes mellitus without complication ([place])    Gallstones    Gout    Hard of hearing    On home O2    Osteoporosis    Pleural effusion    Raynaud's disease    Past Surgical History:  Procedure Laterality Date   ABDOMINAL HYSTERECTOMY     for cervical cancer 1965   COLON SURGERY     hemicolectomy 1998   gallstone surgery     ERCP for gallstone pancreatitis   ORTHOPEDIC SURGERY      reports that she quit smoking about 24 years ago. Her smoking use included cigarettes. She has a 70.00 pack-year smoking history. She has never used smokeless tobacco. She reports that she does not drink alcohol or use drugs. Social History   Socioeconomic History   Marital status: Widowed  Spouse name: Not on file   Number of children: Not on file   Years of education: Not on file   Highest education level: Not on file  Occupational History   Not on file  Social Needs   Financial resource strain: Not on file   Food insecurity:    Worry: Not on file    Inability: Not on file   Transportation needs:    Medical: Not on file    Non-medical: Not on file  Tobacco Use   Smoking status: Former Smoker    Packs/day: 2.00    Years: 35.00    Pack years: 70.00    Types: Cigarettes    Last attempt to quit: 08/11/1994    Years since  quitting: 24.4   Smokeless tobacco: Never Used  Substance and Sexual Activity   Alcohol use: No    Alcohol/week: 0.0 standard drinks   Drug use: No   Sexual activity: Not on file  Lifestyle   Physical activity:    Days per week: Not on file    Minutes per session: Not on file   Stress: Not on file  Relationships   Social connections:    Talks on phone: Not on file    Gets together: Not on file    Attends religious service: Not on file    Active member of club or organization: Not on file    Attends meetings of clubs or organizations: Not on file    Relationship status: Not on file   Intimate partner violence:    Fear of current or ex partner: Not on file    Emotionally abused: Not on file    Physically abused: Not on file    Forced sexual activity: Not on file  Other Topics Concern   Not on file  Social History Narrative   Not on file    Functional Status Survey:    Family History  Problem Relation Age of Onset   Diabetes Sister    Diabetes Sister    Diabetes Sister    Hypertension Mother    Heart disease Mother    Diabetes Sister     Health Maintenance  Topic Date Due   URINE MICROALBUMIN  02/27/2019   FOOT EXAM  03/20/2019 (Originally 01/14/2018)   OPHTHALMOLOGY EXAM  03/20/2019 (Originally 08/05/2016)   TETANUS/TDAP  03/20/2019 (Originally 08/08/1949)   INFLUENZA VACCINE  Completed   PNA vac Low Risk Adult  Completed   DEXA SCAN  Addressed    No Known Allergies  Outpatient Encounter Medications as of 01/19/2019  Medication Sig   alendronate (FOSAMAX) 70 MG tablet TAKE 1 TABLET BY MOUTH EVERY 7 DAYS. TAKE WITH A FULL GLASS OF WATER ON AN EMPTY STOMACH.   amLODipine (NORVASC) 5 MG tablet Take 1 tablet (5 mg total) by mouth daily.   amoxicillin-clavulanate (AUGMENTIN) 875-125 MG tablet Take 1 tablet by mouth 2 (two) times daily for 4 days.   ANORO ELLIPTA 62.5-25 MCG/INH AEPB TAKE 1 PUFF BY MOUTH EVERY DAY   colchicine 0.6 MG  tablet Take 0.6 mg by mouth 2 (two) times daily as needed.   gabapentin (NEURONTIN) 300 MG capsule Take 300 mg by mouth at bedtime.   NON FORMULARY Diet Type: Liquids:  Regular,  NAS, Heart Healthy   OXYGEN Inhale 3 L into the lungs continuous. [employer]   predniSONE (DELTASONE) 20 MG tablet Take 2 tablets (40 mg total) by mouth daily with breakfast for 4 days.   Respiratory Therapy Supplies (FLUTTER)  DEVI Use as directed.   rosuvastatin (CRESTOR) 20 MG tablet Take 1 tablet (20 mg total) by mouth daily.   [DISCONTINUED] albuterol (PROVENTIL) (2.5 MG/3ML) 0.083% nebulizer solution Take 2.5 mg by nebulization every 4 (four) hours as needed for wheezing or shortness of breath.   [DISCONTINUED] Colchicine 0.6 MG CAPS 1.2 mg orally at the first sign of a flare followed by 0.6 mg one hour later; MAX 1.8 mg over 1 hour  Then 0.6 BID as needed for gout pain (Patient not taking: Reported on 01/19/2019)   [DISCONTINUED] gabapentin (NEURONTIN) 300 MG capsule Take 2 capsules (600 mg total) by mouth at bedtime. (Patient not taking: Reported on 01/19/2019)   [DISCONTINUED] Ipratropium-Albuterol (COMBIVENT RESPIMAT) 20-100 MCG/ACT AERS respimat Inhale 1 puff into the lungs every 6 (six) hours as needed for wheezing.   No facility-administered encounter medications on file as of 01/19/2019.     Review of Systems  Constitutional: Negative.   HENT: Negative.   Respiratory: Positive for cough and shortness of breath.   Cardiovascular: Negative for leg swelling.  Gastrointestinal: Positive for diarrhea.  Genitourinary: Negative.   Musculoskeletal: Negative.   Skin: Negative.   Neurological: Positive for weakness.  Psychiatric/Behavioral: Negative.   All other systems reviewed and are negative.   Vitals:   01/19/19 1019  BP: 138/73  Pulse: 80  Resp: 20  Temp: 97.9 F (36.6 C)  SpO2: 96%  Weight: 180 lb 12.8 oz (82 kg)  Height: 5\' 4"  (1.626 m)   Body mass index is 31.03 kg/m. Physical  Exam Vitals signs reviewed.  Constitutional:      Appearance: Normal appearance.  HENT:     Head: Normocephalic.     Nose: Nose normal.     Mouth/Throat:     Mouth: Mucous membranes are moist.     Pharynx: Oropharynx is clear.  Eyes:     Pupils: Pupils are equal, round, and reactive to light.  Neck:     Musculoskeletal: Neck supple.  Cardiovascular:     Rate and Rhythm: Normal rate and regular rhythm.     Pulses: Normal pulses.     Heart sounds: Normal heart sounds.  Pulmonary:     Comments: Decreased Breadth Sounds Bilateral but no wheezing or rales Abdominal:     General: Abdomen is flat. Bowel sounds are normal.     Palpations: Abdomen is soft.  Musculoskeletal:     Comments: Mild swelling bilateral Redness in LE due to PAD Has Left Hand in Brace  Skin:    General: Skin is warm and dry.  Neurological:     General: No focal deficit present.     Mental Status: She is alert and oriented to person, place, and time.  Psychiatric:        Mood and Affect: Mood normal.        Thought Content: Thought content normal.        Judgment: Judgment normal.     Labs reviewed: Basic Metabolic Panel: Recent Labs    01/14/19 1230 01/15/19 0427 01/16/19 0423 01/17/19 0611  NA 133* 140 141 141  K 4.7 4.5 4.3 3.5  CL 87* 95* 100 100  CO2 32 33* 32 31  GLUCOSE 340* 301* 242* 141*  BUN 38* 31* 29* 21  CREATININE 1.15* 0.99 0.93 0.92  CALCIUM 8.7* 7.9* 7.4* 6.9*  MG 1.8 1.8  --   --    Liver Function Tests: Recent Labs    02/26/18 0829 01/14/19 1230  AST 20 36  ALT 14 74*  ALKPHOS  --  75  BILITOT 0.5 0.7  PROT 6.8 6.5  ALBUMIN  --  3.7   No results for input(s): LIPASE, AMYLASE in the last 8760 hours. No results for input(s): AMMONIA in the last 8760 hours. CBC: Recent Labs    03/11/18 1759 01/14/19 1230 01/15/19 0427 01/16/19 0423 01/17/19 0611  WBC 9.1 15.6* 8.7 12.4* 11.8*  NEUTROABS 6.7 14.5* 8.0*  --   --   HGB 11.7* 11.1* 10.3* 9.8* 9.8*  HCT 36.2  36.8 35.3* 33.8* 33.2*  MCV 88.3 91.8 91.9 92.9 91.7  PLT 217 205 185 227 251   Cardiac Enzymes: No results for input(s): CKTOTAL, CKMB, CKMBINDEX, TROPONINI in the last 8760 hours. BNP: Invalid input(s): POCBNP Lab Results  Component Value Date   HGBA1C 6.3 (H) 02/26/2018   Lab Results  Component Value Date   TSH 1.392 03/22/2015   No results found for: VITAMINB12 No results found for: FOLATE No results found for: IRON, TIBC, FERRITIN  Imaging and Procedures obtained prior to SNF admission: Dg Chest 2 View  Result Date: 01/14/2019 CLINICAL DATA:  Cough and shortness of breath for 2 weeks. EXAM: CHEST - 2 VIEW COMPARISON:  01/13/2019 FINDINGS: Cardiomediastinal silhouette is normal. Mediastinal contours appear intact. Calcific atherosclerotic disease of the aorta. Persistent nodular airspace consolidation versus subpleural thickening/pulmonary nodule in the right lower lobe, and right subhilar region. Persistent right pleural effusion. Newly appreciated left pleural/subpleural thickening. Osseous structures are without acute abnormality. Soft tissues are grossly normal. IMPRESSION: 1. Persistent nodular airspace consolidation versus subpleural thickening/pulmonary nodule in the right lower lobe and right subhilar region. 2. Persistent right pleural effusion. 3. Newly appreciated left pleural/subpleural thickening. Electronically Signed   By: Fidela Salisbury M.D.   On: 01/14/2019 13:11   Ct Chest Wo Contrast  Result Date: 01/14/2019 CLINICAL DATA:  Shortness of breath and cough. Rule out pneumonia. Fall 1 week ago. EXAM: CT CHEST WITHOUT CONTRAST TECHNIQUE: Multidetector CT imaging of the chest was performed following the standard protocol without IV contrast. COMPARISON:  Chest x-rays January 14, 2019 and January 13, 2019. FINDINGS: Cardiovascular: Coronary artery calcifications involve the right and left coronary arteries. The heart size is normal. The thoracic aorta demonstrates  atherosclerotic change without aneurysmal dilatation. The main pulmonary artery is normal in appearance. Mediastinum/Nodes: A right thyroid lobe nodule measures 18 mm. The thyroid is otherwise normal. There is a right-sided pleural effusion. No left-sided pleural effusion or pericardial effusion. The visualized esophagus is unremarkable. No adenopathy in the chest. Lungs/Pleura: There is mild mucus in the trachea. There is debris in the right bronchus intermedius and right lower lobe bronchus. There is opacification of some medial left lower lobe bronchi as well, likely filled with debris. No pneumothorax. There is a nodule in the left upper lobe measuring 8 by 10 by 7 mm with a mean diameter of 8 mm. No other nodules or masses. Mild emphysematous changes in the lungs. The left lung is otherwise unremarkable. The findings on today's chest x-ray not seen on today's CT scan including the scout view. There is a right-sided pleural effusion with underlying opacity. No other abnormalities in either lung. Upper Abdomen: Cholelithiasis is identified. The upper abdomen is otherwise unremarkable. Musculoskeletal: No chest wall mass or suspicious bone lesions identified. IMPRESSION: 1. There is a nodule in the left upper lobe with a mean diameter of 8 mm. Non-contrast chest CT at 6-12 months is recommended. If the nodule is stable at time of  repeat CT, then future CT at 18-24 months (from today's scan) is considered optional for low-risk patients, but is recommended for high-risk patients. This recommendation follows the consensus statement: Guidelines for Management of Incidental Pulmonary Nodules Detected on CT Images: From the Fleischner Society 2017; Radiology 2017; 284:228-243. 2. There is a small to moderate right-sided pleural effusion with underlying opacity. The underlying opacity may simply represent atelectasis. 3. Debris in the bronchus intermedius and bilateral lower lobe bronchi consistent with aspirated  material. 4. Atherosclerotic changes in the nonaneurysmal aorta. Coronary artery calcifications. 5. Cholelithiasis. 6. 18 mm right thyroid lobe nodule. A thyroid ultrasound could better evaluate. Aortic Atherosclerosis (ICD10-I70.0) and Emphysema (ICD10-J43.9). Electronically Signed   By: Dorise Bullion III M.D   On: 01/14/2019 16:28    Assessment/Plan  Pneumonia of right lower lobe with Pleural Effusion Was treated with IV antibiotics in the hospital Finishing Augmentin Had CT scan done had  showed Left Upper lobe nodule and thyroid nodule which needs outpatient follow up Will need Follow up of her Chest xray also to follow resolution of her Right Pleural Effusion Closed fracture of Left distal Radius in 01/20 Due to mechanical fall Has brace on  Per ortho it is almost heal Pain seems to be controlled Essential hypertension On Norvasc  Diabetes mellitus  Not on any Meds Will follow Accu checks  COPD, severe ([place]) On Prednisone taper Continue on Oxygen and ANoro Also on duo Nebs PRN  Hyperlipidemia Continue on Statin  Gout of left foot,  On PRN colchicine per PCP  Peripheral Neuropathy On Neurontin Dose reduced as per reqest of family to 200 mg Osteoporosis On Fosamax  Family/ staff Communication: With her daughter  Labs/tests ordered:BMP and CBC Total time spent in this patient care encounter was 45_ minutes; greater than 50% of the visit spent counseling patient, reviewing records , Labs and coordinating care for problems addressed at this encounter.

## 2019-01-23 ENCOUNTER — Encounter: Payer: Self-pay | Admitting: Internal Medicine

## 2019-01-23 ENCOUNTER — Encounter: Payer: Self-pay | Admitting: Family Medicine

## 2019-01-23 ENCOUNTER — Non-Acute Institutional Stay (SKILLED_NURSING_FACILITY): Payer: Medicare HMO | Admitting: Internal Medicine

## 2019-01-23 ENCOUNTER — Encounter (HOSPITAL_COMMUNITY)
Admission: RE | Admit: 2019-01-23 | Discharge: 2019-01-23 | Disposition: A | Discharge status: Home / Self Care | Payer: Medicare HMO | Source: Skilled Nursing Facility | Attending: Internal Medicine | Admitting: Internal Medicine

## 2019-01-23 DIAGNOSIS — J189 Pneumonia, unspecified organism: Secondary | ICD-10-CM

## 2019-01-23 DIAGNOSIS — I10 Essential (primary) hypertension: Secondary | ICD-10-CM | POA: Diagnosis not present

## 2019-01-23 DIAGNOSIS — E1151 Type 2 diabetes mellitus with diabetic peripheral angiopathy without gangrene: Secondary | ICD-10-CM

## 2019-01-23 DIAGNOSIS — J181 Lobar pneumonia, unspecified organism: Secondary | ICD-10-CM | POA: Diagnosis not present

## 2019-01-23 DIAGNOSIS — J449 Chronic obstructive pulmonary disease, unspecified: Secondary | ICD-10-CM

## 2019-01-23 LAB — CBC
HCT: 34.7 % — ABNORMAL LOW (ref 36.0–46.0)
Hemoglobin: 10.4 g/dL — ABNORMAL LOW (ref 12.0–15.0)
MCH: 27.5 pg (ref 26.0–34.0)
MCHC: 30 g/dL (ref 30.0–36.0)
MCV: 91.8 fL (ref 80.0–100.0)
Platelets: 164 10*3/uL (ref 150–400)
RBC: 3.78 MIL/uL — ABNORMAL LOW (ref 3.87–5.11)
RDW: 16.1 % — ABNORMAL HIGH (ref 11.5–15.5)
WBC: 7.8 10*3/uL (ref 4.0–10.5)
nRBC: 0 % (ref 0.0–0.2)

## 2019-01-23 LAB — BASIC METABOLIC PANEL
Anion gap: 10 (ref 5–15)
BUN: 22 mg/dL (ref 8–23)
CO2: 37 mmol/L — ABNORMAL HIGH (ref 22–32)
Calcium: 8.4 mg/dL — ABNORMAL LOW (ref 8.9–10.3)
Chloride: 98 mmol/L (ref 98–111)
Creatinine, Ser: 0.79 mg/dL (ref 0.44–1.00)
GFR calc Af Amer: >60 mL/min (ref >60)
GFR calc non Af Amer: >60 mL/min (ref >60)
Glucose, Bld: 118 mg/dL — ABNORMAL HIGH (ref 70–99)
Potassium: 3.5 mmol/L (ref 3.5–5.1)
Sodium: 145 mmol/L (ref 135–145)

## 2019-01-23 LAB — LACTIC ACID, PLASMA: Lactic Acid, Venous: 2.4 mmol/L (ref 0.5–1.9)

## 2019-01-23 NOTE — Progress Notes (Signed)
Location:    [facility] Room Number: 132/P Place of Service:  SNF [phone])  Provider: Veleta Miners MD  PCP: [name], MD Patient Care Team: [name], MD as PCP - General (Family Medicine)  Extended Emergency Contact Information Primary Emergency Contact: Wandra Arthurs [place] Phone: [phone] Mobile Phone: [phone] Relation: Daughter Secondary Emergency Contact: Paula,Emily Address: [address], [place] Phone: [phone] Work Phone: [phone] Mobile Phone: [phone] Relation: Daughter  Code Status: DNR Goals of care:  Advanced Directive information Advanced Directives 01/23/2019  Does Patient Have a Medical Advance Directive? Yes  Type of Advance Directive Out of facility DNR (pink MOST or yellow form)  Does patient want to make changes to medical advance directive? No - Patient declined  Copy of [place] in Chart? -  Would patient like information on creating a medical advance directive? No - Patient declined  Pre-existing out of facility DNR order (yellow form or pink MOST form) -     No Known Allergies  Chief Complaint  Patient presents with   Discharge Note    Discharge Visit    HPI:  85 y.o. female seen today for discharge from facility. Patient was admitted to facility for Therapy.  Patient was in the hospital from 03/25- 03/28 for Right Lower Lobe Pneumonia, COPD and Hypoxia  Patient has a past medical history of COPD on 3 L of oxygen, type 2 diabetes, hypertension, osteoporosis, hypertension, PAD, hyperlipidemia, chronic diarrhea and recent Left Distal Radius fracture due to mechanical Fall in 01/19  She was getting outpatient treatment for her COPD with antibiotics and prednisone.  But patient continued to get weak, hypoxic.  She had a chest x-ray done which showed right lower lobe pneumonia.  Was treated with  Zosyn and azithromycin and was changed to oral Augmentin to finish a 7-day course. She also received prednisone taper. Patient improved but continued to be weak and so it was decided to send her to SNF for rehab. She did really good in the facility.  She is already independent in her transfers.  She still need mild assist with walking with a walker. Patient denied any cough, fever, chest pain.  She does have shortness of breath on exertion which is her baseline.  She denied any dysuria.  Nausea or vomiting.  She is eating well.  Had any new issues in the facility  Past Medical History:  Diagnosis Date   Cancer [facility])    colon   Cancer of cervix ([place])    cervix   COPD (chronic obstructive pulmonary disease) ([place])    Diabetes mellitus without complication ([place])    Gallstones    Gout    Hard of hearing    On home O2    Osteoporosis    Pleural effusion    Raynaud's disease     Past Surgical History:  Procedure Laterality Date   ABDOMINAL HYSTERECTOMY     for cervical cancer 1965   COLON SURGERY     hemicolectomy 1998   gallstone surgery     ERCP for gallstone pancreatitis   ORTHOPEDIC SURGERY        reports that she quit smoking about 24 years ago. Her smoking use included cigarettes. She has a 70.00 pack-year smoking history. She has never used smokeless tobacco. She reports that she does not drink alcohol or use drugs.  Social History   Socioeconomic History   Marital status: Widowed    Spouse name: Not on file   Number of children: Not on file   Years of education: Not on file   Highest education level: Not on file  Occupational History   Not on file  Social Needs   Financial resource strain: Not on file   Food insecurity:    Worry: Not on file    Inability: Not on file   Transportation needs:    Medical: Not on file    Non-medical: Not on file  Tobacco Use   Smoking status: Former Smoker    Packs/day: 2.00    Years: 35.00    Pack years:  70.00    Types: Cigarettes    Last attempt to quit: 08/11/1994    Years since quitting: 24.4   Smokeless tobacco: Never Used  Substance and Sexual Activity   Alcohol use: No    Alcohol/week: 0.0 standard drinks   Drug use: No   Sexual activity: Not on file  Lifestyle   Physical activity:    Days per week: Not on file    Minutes per session: Not on file   Stress: Not on file  Relationships   Social connections:    Talks on phone: Not on file    Gets together: Not on file    Attends religious service: Not on file    Active member of club or organization: Not on file    Attends meetings of clubs or organizations: Not on file    Relationship status: Not on file   Intimate partner violence:    Fear of current or ex partner: Not on file    Emotionally abused: Not on file    Physically abused: Not on file    Forced sexual activity: Not on file  Other Topics Concern   Not on file  Social History Narrative   Not on file   Functional Status Survey:    No Known Allergies  Pertinent  Health Maintenance Due  Topic Date Due   URINE MICROALBUMIN  02/27/2019   FOOT EXAM  03/20/2019 (Originally 01/14/2018)   OPHTHALMOLOGY EXAM  03/20/2019 (Originally 08/05/2016)   INFLUENZA VACCINE  05/23/2019   PNA vac Low Risk Adult  Completed   DEXA SCAN  Addressed    Medications: Outpatient Encounter Medications as of 01/23/2019  Medication Sig   alendronate (FOSAMAX) 70 MG tablet TAKE 1 TABLET BY MOUTH EVERY 7 DAYS. TAKE WITH A FULL GLASS OF WATER ON AN EMPTY STOMACH.   amLODipine (NORVASC) 5 MG tablet Take 1 tablet (5 mg total) by mouth daily.   ANORO ELLIPTA 62.5-25 MCG/INH AEPB TAKE 1 PUFF BY MOUTH EVERY DAY   Balsam [place]-Castor Oil (VENELEX) OINT Apply to bilateral buttocks and sacrum qshift and prn for shear to buttocks and prevention. Every shift   colchicine 0.6 MG tablet Take 0.6 mg by mouth 2 (two) times daily as needed.   gabapentin (NEURONTIN) 100 MG capsule  Take 200 mg by mouth at bedtime.   Ipratropium-Albuterol (COMBIVENT RESPIMAT) 20-100 MCG/ACT AERS respimat Inhale 1 puff into the lungs every 4 (four) hours as needed for wheezing.   NON FORMULARY Diet Type: Liquids:  Regular,  NAS, Heart Healthy   OXYGEN Inhale 3 L into the lungs continuous. [employer]   Respiratory Therapy Supplies (FLUTTER) DEVI Use as directed.   rosuvastatin (CRESTOR) 20 MG tablet Take 1 tablet (20 mg total) by mouth daily.   [DISCONTINUED] gabapentin (NEURONTIN)  300 MG capsule Take 300 mg by mouth at bedtime.   No facility-administered encounter medications on file as of 01/23/2019.      Review of Systems  There were no vitals filed for this visit. There is no height or weight on file to calculate BMI. Physical Exam Vitals signs reviewed.  HENT:     Head: Normocephalic.     Nose: Nose normal.     Mouth/Throat:     Mouth: Mucous membranes are moist.     Pharynx: Oropharynx is clear.  Eyes:     Pupils: Pupils are equal, round, and reactive to light.  Neck:     Musculoskeletal: Neck supple.  Cardiovascular:     Rate and Rhythm: Normal rate and regular rhythm.     Pulses: Normal pulses.     Heart sounds: Normal heart sounds.  Pulmonary:     Effort: Pulmonary effort is normal. No respiratory distress.     Breath sounds: Normal breath sounds. No wheezing or rales.  Abdominal:     General: Abdomen is flat. Bowel sounds are normal.     Palpations: Abdomen is soft.  Musculoskeletal:        General: Swelling present.  Skin:    General: Skin is warm and dry.  Neurological:     General: No focal deficit present.     Mental Status: She is alert and oriented to person, place, and time.  Psychiatric:        Mood and Affect: Mood normal.        Thought Content: Thought content normal.        Judgment: Judgment normal.     Labs reviewed: Basic Metabolic Panel: Recent Labs    01/14/19 1230 01/15/19 0427 01/16/19 0423 01/17/19 0611 01/23/19 0718  NA  133* 140 141 141 145  K 4.7 4.5 4.3 3.5 3.5  CL 87* 95* 100 100 98  CO2 32 33* 32 31 37*  GLUCOSE 340* 301* 242* 141* 118*  BUN 38* 31* 29* 21 22  CREATININE 1.15* 0.99 0.93 0.92 0.79  CALCIUM 8.7* 7.9* 7.4* 6.9* 8.4*  MG 1.8 1.8  --   --   --    Liver Function Tests: Recent Labs    02/26/18 0829 01/14/19 1230  AST 20 36  ALT 14 74*  ALKPHOS  --  75  BILITOT 0.5 0.7  PROT 6.8 6.5  ALBUMIN  --  3.7   No results for input(s): LIPASE, AMYLASE in the last 8760 hours. No results for input(s): AMMONIA in the last 8760 hours. CBC: Recent Labs    03/11/18 1759 01/14/19 1230 01/15/19 0427 01/16/19 0423 01/17/19 0611 01/23/19 0718  WBC 9.1 15.6* 8.7 12.4* 11.8* 7.8  NEUTROABS 6.7 14.5* 8.0*  --   --   --   HGB 11.7* 11.1* 10.3* 9.8* 9.8* 10.4*  HCT 36.2 36.8 35.3* 33.8* 33.2* 34.7*  MCV 88.3 91.8 91.9 92.9 91.7 91.8  PLT 217 205 185 227 251 164   Cardiac Enzymes: No results for input(s): CKTOTAL, CKMB, CKMBINDEX, TROPONINI in the last 8760 hours. BNP: Invalid input(s): POCBNP CBG: Recent Labs    01/16/19 1753 01/16/19 2103 01/17/19 0726  GLUCAP 268* 235* 124*    Procedures and Imaging Studies During Stay: Dg Chest 2 View  Result Date: 01/14/2019 CLINICAL DATA:  Cough and shortness of breath for 2 weeks. EXAM: CHEST - 2 VIEW COMPARISON:  01/13/2019 FINDINGS: Cardiomediastinal silhouette is normal. Mediastinal contours appear intact. Calcific atherosclerotic disease of the aorta.  Persistent nodular airspace consolidation versus subpleural thickening/pulmonary nodule in the right lower lobe, and right subhilar region. Persistent right pleural effusion. Newly appreciated left pleural/subpleural thickening. Osseous structures are without acute abnormality. Soft tissues are grossly normal. IMPRESSION: 1. Persistent nodular airspace consolidation versus subpleural thickening/pulmonary nodule in the right lower lobe and right subhilar region. 2. Persistent right pleural  effusion. 3. Newly appreciated left pleural/subpleural thickening. Electronically Signed   By: Fidela Salisbury M.D.   On: 01/14/2019 13:11   Dg Chest 2 View  Result Date: 01/13/2019 CLINICAL DATA:  Diminished breath sounds bilaterally. EXAM: CHEST - 2 VIEW COMPARISON:  10/01/2018 FINDINGS: Cardiomediastinal silhouette is normal. Mediastinal contours appear intact. Persistent right pleural effusion with right lower lobe atelectasis versus airspace consolidation. Fullness in the right subhilar region. Osseous structures are without acute abnormality. Soft tissues are grossly normal. IMPRESSION: 1. Persistent right pleural effusion with right lower lobe atelectasis versus airspace consolidation. 2. Fullness in the right subhilar region may represent overlapping vascular shadows, lymphadenopathy or potentially a pulmonary mass. Electronically Signed   By: Fidela Salisbury M.D.   On: 01/13/2019 16:29   Ct Chest Wo Contrast  Result Date: 01/14/2019 CLINICAL DATA:  Shortness of breath and cough. Rule out pneumonia. Fall 1 week ago. EXAM: CT CHEST WITHOUT CONTRAST TECHNIQUE: Multidetector CT imaging of the chest was performed following the standard protocol without IV contrast. COMPARISON:  Chest x-rays January 14, 2019 and January 13, 2019. FINDINGS: Cardiovascular: Coronary artery calcifications involve the right and left coronary arteries. The heart size is normal. The thoracic aorta demonstrates atherosclerotic change without aneurysmal dilatation. The main pulmonary artery is normal in appearance. Mediastinum/Nodes: A right thyroid lobe nodule measures 18 mm. The thyroid is otherwise normal. There is a right-sided pleural effusion. No left-sided pleural effusion or pericardial effusion. The visualized esophagus is unremarkable. No adenopathy in the chest. Lungs/Pleura: There is mild mucus in the trachea. There is debris in the right bronchus intermedius and right lower lobe bronchus. There is opacification  of some medial left lower lobe bronchi as well, likely filled with debris. No pneumothorax. There is a nodule in the left upper lobe measuring 8 by 10 by 7 mm with a mean diameter of 8 mm. No other nodules or masses. Mild emphysematous changes in the lungs. The left lung is otherwise unremarkable. The findings on today's chest x-ray not seen on today's CT scan including the scout view. There is a right-sided pleural effusion with underlying opacity. No other abnormalities in either lung. Upper Abdomen: Cholelithiasis is identified. The upper abdomen is otherwise unremarkable. Musculoskeletal: No chest wall mass or suspicious bone lesions identified. IMPRESSION: 1. There is a nodule in the left upper lobe with a mean diameter of 8 mm. Non-contrast chest CT at 6-12 months is recommended. If the nodule is stable at time of repeat CT, then future CT at 18-24 months (from today's scan) is considered optional for low-risk patients, but is recommended for high-risk patients. This recommendation follows the consensus statement: Guidelines for Management of Incidental Pulmonary Nodules Detected on CT Images: From the Fleischner Society 2017; Radiology 2017; 284:228-243. 2. There is a small to moderate right-sided pleural effusion with underlying opacity. The underlying opacity may simply represent atelectasis. 3. Debris in the bronchus intermedius and bilateral lower lobe bronchi consistent with aspirated material. 4. Atherosclerotic changes in the nonaneurysmal aorta. Coronary artery calcifications. 5. Cholelithiasis. 6. 18 mm right thyroid lobe nodule. A thyroid ultrasound could better evaluate. Aortic Atherosclerosis (ICD10-I70.0) and Emphysema (ICD10-J43.9). Electronically  Signed   By: Dorise Bullion III M.D   On: 01/14/2019 16:28    Assessment/Plan:   Pneumonia of right lower lobe with Pleural Effusion Was treated with IV antibiotics in the hospital Finished Augmentin Had CT scan done had  showed Left Upper lobe  nodule and thyroid nodule which needs outpatient follow up Will need Follow up of her Chest xray also to follow resolution of her Right Pleural Effusion Closed fracture of Left distal Radius in 01/20 Due to mechanical fall Has brace on  Per ortho it is almost heal Pain was controlled in facility Essential hypertension On Norvasc Diabetes mellitus  Not on any Meds BS controlled COPD, severe (HCC) Finished Prednisone taper Continue on Oxygen and ANoro Also on duo Nebs PRN  Hyperlipidemia Continue on Statin  Gout of left foot,  On PRN colchicine per PCP  Peripheral Neuropathy On Neurontin Dose reduced as per reqest of family to 200 mg Osteoporosis On Fosamax  Future labs/tests needed:  Patient needs follow up of her elevated Lactic acid . Chest XRAY for Right Lower Lobe pneumonia Also Need CT scan follow up for her Lung and Thyroid Nodule She was provided with DME [facility] and Home PT/OT will follow her Also follow with PCP  Discharge Planning for more then 35 min

## 2019-01-25 DIAGNOSIS — R5381 Other malaise: Secondary | ICD-10-CM | POA: Diagnosis not present

## 2019-01-25 DIAGNOSIS — I10 Essential (primary) hypertension: Secondary | ICD-10-CM | POA: Diagnosis not present

## 2019-01-25 DIAGNOSIS — J90 Pleural effusion, not elsewhere classified: Secondary | ICD-10-CM | POA: Diagnosis not present

## 2019-01-25 DIAGNOSIS — E785 Hyperlipidemia, unspecified: Secondary | ICD-10-CM | POA: Diagnosis not present

## 2019-01-25 DIAGNOSIS — I700 Atherosclerosis of aorta: Secondary | ICD-10-CM | POA: Diagnosis not present

## 2019-01-25 DIAGNOSIS — M109 Gout, unspecified: Secondary | ICD-10-CM | POA: Diagnosis not present

## 2019-01-25 DIAGNOSIS — I7300 Raynaud's syndrome without gangrene: Secondary | ICD-10-CM | POA: Diagnosis not present

## 2019-01-25 DIAGNOSIS — J9612 Chronic respiratory failure with hypercapnia: Secondary | ICD-10-CM | POA: Diagnosis not present

## 2019-01-25 DIAGNOSIS — L89321 Pressure ulcer of left buttock, stage 1: Secondary | ICD-10-CM | POA: Diagnosis not present

## 2019-01-25 DIAGNOSIS — J441 Chronic obstructive pulmonary disease with (acute) exacerbation: Secondary | ICD-10-CM | POA: Diagnosis not present

## 2019-01-26 ENCOUNTER — Other Ambulatory Visit: Payer: Self-pay

## 2019-01-26 ENCOUNTER — Ambulatory Visit (INDEPENDENT_AMBULATORY_CARE_PROVIDER_SITE_OTHER): Payer: Medicare HMO | Admitting: Family Medicine

## 2019-01-26 ENCOUNTER — Encounter: Payer: Self-pay | Admitting: Family Medicine

## 2019-01-26 VITALS — BP 110/64 | HR 86 | Temp 98.5°F | Resp 18

## 2019-01-26 DIAGNOSIS — Z09 Encounter for follow-up examination after completed treatment for conditions other than malignant neoplasm: Secondary | ICD-10-CM

## 2019-01-26 DIAGNOSIS — I10 Essential (primary) hypertension: Secondary | ICD-10-CM | POA: Diagnosis not present

## 2019-01-26 DIAGNOSIS — T17908A Unspecified foreign body in respiratory tract, part unspecified causing other injury, initial encounter: Secondary | ICD-10-CM

## 2019-01-26 DIAGNOSIS — J441 Chronic obstructive pulmonary disease with (acute) exacerbation: Secondary | ICD-10-CM | POA: Diagnosis not present

## 2019-01-26 NOTE — Progress Notes (Signed)
Subjective:    Patient ID: Emily Owen, female    DOB: 06/19/1930, 85 y.o.   MRN: [id number]  HPI Patient was recently admitted to the hospital and sepsis.  I have copied relevant portions of the discharge summary and included them below for my reference  Admit date: 01/14/2019  Discharge date: 01/17/2019  Admitted From:Home                         Disposition:  SNF  Recommendations for Outpatient Follow-up:  1. Follow up with PCP in 1-2 weeks 2. Continue on Augmentin as prescribed for 4 more days to finish course of treatment. 3. Continue on prednisone for 4 more days to finish course of treatment. 4. Recommend follow-up CBC/lactic acid in 3 to 5 days.  Home Health: None  Equipment/Devices: Chronic 3 L home O2 nasal cannula  Discharge Condition: Stable  CODE STATUS: DNR  Diet recommendation: Heart Healthy/carb modified  Brief/Interim Summary: Per HPI: Emily Wisenbaker Morrisonis a 85 y.o.femalewith medical history significant forchronic hypoxic respiratory failure in the setting of severe COPD on 3 L continuous supplemental oxygen, type 2 diabetes mellitus, hypertension, who is admitted [facility] on3/25/2020 with severe sepsis due to right lower lobe pneumonia [facility]  The patient reports 3 to 4 days of progressive shortness of breath associated with new onset nonproductive cough and wheezing.She also noted generalized weakness over that timeframe in the absence of any acute focal weakness.She denies any associated subjective fever, rigors, generalized myalgias, or rash. She reports that her shortness of breath has not been associated with any orthopnea, PND, or peripheral edema.Denies any associated chest pain, palpitations, diaphoresis, dizziness,or hemoptysis.She experienced some nausea approximately 2 days ago, but states that this was not associated with any  vomiting, and has independently resolved in the interval. She denies any abdominal pain or diarrhea. Denies any recent traveling or known sick contacts,including noknownexposure toCOVID-19 cases. Denies any recent trauma. Denies any associated rhinitis, rhinorrhea, or sore throat. In the setting of the above symptoms, the patient saw her PCP on 01/12/2019, who diagnosed her with an acute COPD exacerbation at that time.Consequently, she was prescribed a prednisone taper by her PCP, but has not experienced any improvement in her breathing following 2 subsequent doses of prednisone 40 mg,prompting her to present to the emergency department today for further evaluation of her ongoing shortness of breath.  Patient was admitted with severe sepsis due to right lower lobe pneumonia along with associated acute on chronic hypoxemic respiratory failure and acute COPD exacerbation.  She was treated with IV Zosyn and azithromycin while here but can be transitioned to oral Augmentin at this time for 4 more days to finish a 7-day total course of treatment.  She has also had vast improvements in her shortness of breath and wheezing and her COPD exacerbation has resolved.  She will remain on duo nebs as needed as well as oral prednisone for 4 more days.  She has been seen by physical therapy on 3/27 with recommendations for discharge to skilled nursing facility at this time for aggressive rehabilitation.  She is agreeable to this plan and has had no other acute events or concerns during the course of this brief admission.  She is otherwise stable for discharge today.  Notably, her lactic acid on day of discharge is 2, but has been downtrending.  Clinically she is much improved and there  are no other signs of endorgan injury.  Discharge Diagnoses:  Principal Problem:   Severe sepsis ([place]) Active Problems:   Acute on chronic respiratory failure with hypoxia (HCC)   COPD with acute exacerbation (HCC)   HTN  (hypertension)   Diabetes mellitus with peripheral vascular disease (HCC)   Pressure injury of skin   RLL pneumonia (HCC)   Generalized weakness   Left upper lobe pulmonary nodule  Principal discharge diagnosis: Severe sepsis secondary to right lower lobe pneumonia with associated COPD exacerbation.  Had CT in the hospital with the results listed below: IMPRESSION: 1. There is a nodule in the left upper lobe with a mean diameter of 8 mm. Non-contrast chest CT at 6-12 months is recommended. If the nodule is stable at time of repeat CT, then future CT at 18-24 months (from today's scan) is considered optional for low-risk patients, but is recommended for high-risk patients. This recommendation follows the consensus statement: Guidelines for Management of Incidental Pulmonary Nodules Detected on CT Images: From the Fleischner Society 2017; Radiology 2017; 284:228-243. 2. There is a small to moderate right-sided pleural effusion with underlying opacity. The underlying opacity may simply represent atelectasis. 3. Debris in the bronchus intermedius and bilateral lower lobe bronchi consistent with aspirated material. 4. Atherosclerotic changes in the nonaneurysmal aorta. Coronary artery calcifications. 5. Cholelithiasis. 6. 18 mm right thyroid lobe nodule. A thyroid ultrasound could better evaluate.  Patient is here today with her daughter for follow-up.  We discussed the nodule in the right lobe of her thyroid.  Given her age, her frailty, and her medical comorbidities, I have recommended against pursuing this with a thyroid ultrasound and fine-needle aspiration biopsy.  Patient is comfortable with this.  We also discussed the 8 mm nodule seen in the left upper lobe.  They have recommended a repeat CT scan in 6 months.  However the patient states that if she has cancer, she would not pursue treatment at this time and therefore, we have tentatively agreed not to pursue additional imaging of  this location.  We also discussed the debris that was seen in the bronchi bilaterally consistent with aspirated material.  The patient denies any history of choking or aspirating prior to the event however it appears that she may be experiencing silent aspiration that could put her at high risk for future pneumonias.  We discussed performing a swallowing study.  Overall the patient has done better.  Her breathing has improved considerably since she has discharge from the hospital.  She states that she is now back to her baseline.  She denies any fevers.  She denies any chest pain.  She denies any shortness of breath beyond her baseline.  On examination today, I cannot appreciate any rales in either lung.  However the patient does have diminished breath sounds bilaterally and poor airflow.  Past Medical History:  Diagnosis Date   Cancer [facility])    colon   Cancer of cervix ([place])    cervix   COPD (chronic obstructive pulmonary disease) ([place])    Diabetes mellitus without complication ([place])    Gallstones    Gout    Hard of hearing    On home O2    Osteoporosis    Pleural effusion    Raynaud's disease    Past Surgical History:  Procedure Laterality Date   ABDOMINAL HYSTERECTOMY     for cervical cancer 1965   COLON SURGERY     hemicolectomy 1998   gallstone surgery  ERCP for gallstone pancreatitis   ORTHOPEDIC SURGERY     Current Outpatient Medications on File Prior to Visit  Medication Sig Dispense Refill   alendronate (FOSAMAX) 70 MG tablet TAKE 1 TABLET BY MOUTH EVERY 7 DAYS. TAKE WITH A FULL GLASS OF WATER ON AN EMPTY STOMACH. 12 tablet 3   amLODipine (NORVASC) 5 MG tablet Take 1 tablet (5 mg total) by mouth daily. 90 tablet 3   ANORO ELLIPTA 62.5-25 MCG/INH AEPB TAKE 1 PUFF BY MOUTH EVERY DAY 60 each 5   Balsam [place]-Castor Oil (VENELEX) OINT Apply to bilateral buttocks and sacrum qshift and prn for shear to buttocks and prevention. Every shift     colchicine 0.6  MG tablet Take 0.6 mg by mouth 2 (two) times daily as needed.     gabapentin (NEURONTIN) 100 MG capsule Take 200 mg by mouth at bedtime.     Ipratropium-Albuterol (COMBIVENT RESPIMAT) 20-100 MCG/ACT AERS respimat Inhale 1 puff into the lungs every 4 (four) hours as needed for wheezing.     NON FORMULARY Diet Type: Liquids:  Regular,  NAS, Heart Healthy     OXYGEN Inhale 3 L into the lungs continuous. [facility]     Respiratory Therapy Supplies (FLUTTER) DEVI Use as directed. 1 each 0   rosuvastatin (CRESTOR) 20 MG tablet Take 1 tablet (20 mg total) by mouth daily. 90 tablet 3   No current facility-administered medications on file prior to visit.    No Known Allergies Social History   Socioeconomic History   Marital status: Widowed    Spouse name: Not on file   Number of children: Not on file   Years of education: Not on file   Highest education level: Not on file  Occupational History   Not on file  Social Needs   Financial resource strain: Not on file   Food insecurity:    Worry: Not on file    Inability: Not on file   Transportation needs:    Medical: Not on file    Non-medical: Not on file  Tobacco Use   Smoking status: Former Smoker    Packs/day: 2.00    Years: 35.00    Pack years: 70.00    Types: Cigarettes    Last attempt to quit: 08/11/1994    Years since quitting: 24.4   Smokeless tobacco: Never Used  Substance and Sexual Activity   Alcohol use: No    Alcohol/week: 0.0 standard drinks   Drug use: No   Sexual activity: Not on file  Lifestyle   Physical activity:    Days per week: Not on file    Minutes per session: Not on file   Stress: Not on file  Relationships   Social connections:    Talks on phone: Not on file    Gets together: Not on file    Attends religious service: Not on file    Active member of club or organization: Not on file    Attends meetings of clubs or organizations: Not on file    Relationship status: Not on file    Intimate partner violence:    Fear of current or ex partner: Not on file    Emotionally abused: Not on file    Physically abused: Not on file    Forced sexual activity: Not on file  Other Topics Concern   Not on file  Social History Narrative   Not on file     Review of Systems  All other systems reviewed and  are negative.      Objective:   Physical Exam Vitals signs reviewed.  Constitutional:      General: She is not in acute distress.    Appearance: She is well-developed. She is not diaphoretic.  Neck:     Vascular: No JVD.  Cardiovascular:     Rate and Rhythm: Normal rate and regular rhythm.     Heart sounds: Normal heart sounds. No murmur. No friction rub. No gallop.   Pulmonary:     Effort: Pulmonary effort is normal. No respiratory distress.     Breath sounds: Decreased air movement present. Examination of the right-upper field reveals decreased breath sounds. Examination of the left-upper field reveals decreased breath sounds. Examination of the right-middle field reveals decreased breath sounds. Examination of the left-middle field reveals decreased breath sounds. Examination of the right-lower field reveals decreased breath sounds. Examination of the left-lower field reveals decreased breath sounds. Decreased breath sounds present. No wheezing, rhonchi or rales.  Abdominal:     General: Bowel sounds are normal. There is no distension.     Palpations: Abdomen is soft.           Assessment & Plan:  Hospital discharge follow-up - Plan: Lactic Acid, Plasma, BASIC METABOLIC PANEL WITH GFR, CBC with Differential/Platelet, SLP modified barium swallow  COPD exacerbation (HCC)  Aspiration into airway, initial encounter - Plan: SLP modified barium swallow  Patient appears to have suffered aspiration pneumonia.  I will repeat the CBC and lactic acid as recommended at the hospital.  Clinically the patient has improved and appears to be back to her baseline.  After  having a long discussion with the patient and her daughter, we have elected not to pursue any work-up of the nodule seen in her thyroid gland.  We have also decided against any further CT imaging of the 8 mm left upper lobe pulmonary nodule seen on the CT scan.  However we will proceed with a speech therapy consult for a modified barium swallow to evaluate for aspiration risk.  The patient may need a modified diet to help prevent future episodes of pneumonia.  More than 25 minutes were spent today with the patient and her family discussing the situation and answering questions.

## 2019-01-27 ENCOUNTER — Encounter: Payer: Self-pay | Admitting: Family Medicine

## 2019-01-27 ENCOUNTER — Other Ambulatory Visit: Payer: Self-pay | Admitting: Family Medicine

## 2019-01-27 DIAGNOSIS — Z66 Do not resuscitate: Secondary | ICD-10-CM | POA: Insufficient documentation

## 2019-01-27 MED ORDER — POTASSIUM CHLORIDE CRYS ER 20 MEQ PO TBCR: 20.0000 meq | EXTENDED_RELEASE_TABLET | Freq: Every day | ORAL | 0 refills | Status: DC

## 2019-01-28 DIAGNOSIS — R5381 Other malaise: Secondary | ICD-10-CM | POA: Diagnosis not present

## 2019-01-28 DIAGNOSIS — L89321 Pressure ulcer of left buttock, stage 1: Secondary | ICD-10-CM | POA: Diagnosis not present

## 2019-01-28 DIAGNOSIS — E785 Hyperlipidemia, unspecified: Secondary | ICD-10-CM | POA: Diagnosis not present

## 2019-01-28 DIAGNOSIS — J90 Pleural effusion, not elsewhere classified: Secondary | ICD-10-CM | POA: Diagnosis not present

## 2019-01-28 DIAGNOSIS — I700 Atherosclerosis of aorta: Secondary | ICD-10-CM | POA: Diagnosis not present

## 2019-01-28 DIAGNOSIS — I7300 Raynaud's syndrome without gangrene: Secondary | ICD-10-CM | POA: Diagnosis not present

## 2019-01-28 DIAGNOSIS — M109 Gout, unspecified: Secondary | ICD-10-CM | POA: Diagnosis not present

## 2019-01-28 DIAGNOSIS — J441 Chronic obstructive pulmonary disease with (acute) exacerbation: Secondary | ICD-10-CM | POA: Diagnosis not present

## 2019-01-28 DIAGNOSIS — I10 Essential (primary) hypertension: Secondary | ICD-10-CM | POA: Diagnosis not present

## 2019-01-28 DIAGNOSIS — J9612 Chronic respiratory failure with hypercapnia: Secondary | ICD-10-CM | POA: Diagnosis not present

## 2019-01-28 LAB — CBC WITH DIFFERENTIAL/PLATELET
Absolute Monocytes: 397 cells/uL (ref 200–950)
Basophils Absolute: 19 cells/uL (ref 0–200)
Basophils Relative: 0.3 %
Eosinophils Absolute: 239 cells/uL (ref 15–500)
Eosinophils Relative: 3.8 %
HCT: 33 % — ABNORMAL LOW (ref 35.0–45.0)
Hemoglobin: 10.7 g/dL — ABNORMAL LOW (ref 11.7–15.5)
Lymphs Abs: 1077 cells/uL (ref 850–3900)
MCH: 28.2 pg (ref 27.0–33.0)
MCHC: 32.4 g/dL (ref 32.0–36.0)
MCV: 87.1 fL (ref 80.0–100.0)
MPV: 9.4 fL (ref 7.5–12.5)
Monocytes Relative: 6.3 %
Neutro Abs: 4568 cells/uL (ref 1500–7800)
Neutrophils Relative %: 72.5 %
Platelets: 145 10*3/uL (ref 140–400)
RBC: 3.79 10*6/uL — ABNORMAL LOW (ref 3.80–5.10)
RDW: 15.7 % — ABNORMAL HIGH (ref 11.0–15.0)
Total Lymphocyte: 17.1 %
WBC: 6.3 10*3/uL (ref 3.8–10.8)

## 2019-01-28 LAB — BASIC METABOLIC PANEL WITH GFR
BUN: 16 mg/dL (ref 7–25)
CO2: 38 mmol/L — ABNORMAL HIGH (ref 20–32)
Calcium: 10.1 mg/dL (ref 8.6–10.4)
Chloride: 95 mmol/L — ABNORMAL LOW (ref 98–110)
Creat: 0.84 mg/dL (ref 0.60–0.88)
GFR, Est African American: 73 mL/min/{1.73_m2} (ref >=60)
GFR, Est Non African American: 63 mL/min/{1.73_m2} (ref >=60)
Glucose, Bld: 165 mg/dL — ABNORMAL HIGH (ref 65–99)
Potassium: 3.4 mmol/L — ABNORMAL LOW (ref 3.5–5.3)
Sodium: 141 mmol/L (ref 135–146)

## 2019-01-28 LAB — LACTIC ACID, PLASMA: LACTIC ACID: 1.6 mmol/L (ref 0.4–1.8)

## 2019-01-29 ENCOUNTER — Encounter (INDEPENDENT_AMBULATORY_CARE_PROVIDER_SITE_OTHER): Payer: Self-pay | Admitting: Orthopaedic Surgery

## 2019-01-29 ENCOUNTER — Telehealth (INDEPENDENT_AMBULATORY_CARE_PROVIDER_SITE_OTHER): Payer: Self-pay

## 2019-01-29 NOTE — Telephone Encounter (Signed)
Talked with patient's daughter and appointment has been canceled for Monday, 02/02/2019. Stated that she will call back at a later time to R/S.

## 2019-01-30 DIAGNOSIS — J90 Pleural effusion, not elsewhere classified: Secondary | ICD-10-CM | POA: Diagnosis not present

## 2019-01-30 DIAGNOSIS — E785 Hyperlipidemia, unspecified: Secondary | ICD-10-CM | POA: Diagnosis not present

## 2019-01-30 DIAGNOSIS — R5381 Other malaise: Secondary | ICD-10-CM | POA: Diagnosis not present

## 2019-01-30 DIAGNOSIS — I700 Atherosclerosis of aorta: Secondary | ICD-10-CM | POA: Diagnosis not present

## 2019-01-30 DIAGNOSIS — J9612 Chronic respiratory failure with hypercapnia: Secondary | ICD-10-CM | POA: Diagnosis not present

## 2019-01-30 DIAGNOSIS — I10 Essential (primary) hypertension: Secondary | ICD-10-CM | POA: Diagnosis not present

## 2019-01-30 DIAGNOSIS — J441 Chronic obstructive pulmonary disease with (acute) exacerbation: Secondary | ICD-10-CM | POA: Diagnosis not present

## 2019-01-30 DIAGNOSIS — I7300 Raynaud's syndrome without gangrene: Secondary | ICD-10-CM | POA: Diagnosis not present

## 2019-01-30 DIAGNOSIS — L89321 Pressure ulcer of left buttock, stage 1: Secondary | ICD-10-CM | POA: Diagnosis not present

## 2019-01-30 DIAGNOSIS — M109 Gout, unspecified: Secondary | ICD-10-CM | POA: Diagnosis not present

## 2019-02-02 ENCOUNTER — Ambulatory Visit (INDEPENDENT_AMBULATORY_CARE_PROVIDER_SITE_OTHER): Payer: Medicare HMO | Admitting: Orthopaedic Surgery

## 2019-02-03 DIAGNOSIS — E785 Hyperlipidemia, unspecified: Secondary | ICD-10-CM | POA: Diagnosis not present

## 2019-02-03 DIAGNOSIS — L89321 Pressure ulcer of left buttock, stage 1: Secondary | ICD-10-CM | POA: Diagnosis not present

## 2019-02-03 DIAGNOSIS — R5381 Other malaise: Secondary | ICD-10-CM | POA: Diagnosis not present

## 2019-02-03 DIAGNOSIS — I7300 Raynaud's syndrome without gangrene: Secondary | ICD-10-CM | POA: Diagnosis not present

## 2019-02-03 DIAGNOSIS — J90 Pleural effusion, not elsewhere classified: Secondary | ICD-10-CM | POA: Diagnosis not present

## 2019-02-03 DIAGNOSIS — M109 Gout, unspecified: Secondary | ICD-10-CM | POA: Diagnosis not present

## 2019-02-03 DIAGNOSIS — I10 Essential (primary) hypertension: Secondary | ICD-10-CM | POA: Diagnosis not present

## 2019-02-03 DIAGNOSIS — J441 Chronic obstructive pulmonary disease with (acute) exacerbation: Secondary | ICD-10-CM | POA: Diagnosis not present

## 2019-02-03 DIAGNOSIS — J9612 Chronic respiratory failure with hypercapnia: Secondary | ICD-10-CM | POA: Diagnosis not present

## 2019-02-03 DIAGNOSIS — I700 Atherosclerosis of aorta: Secondary | ICD-10-CM | POA: Diagnosis not present

## 2019-02-04 DIAGNOSIS — J90 Pleural effusion, not elsewhere classified: Secondary | ICD-10-CM | POA: Diagnosis not present

## 2019-02-04 DIAGNOSIS — I7300 Raynaud's syndrome without gangrene: Secondary | ICD-10-CM | POA: Diagnosis not present

## 2019-02-04 DIAGNOSIS — I10 Essential (primary) hypertension: Secondary | ICD-10-CM | POA: Diagnosis not present

## 2019-02-04 DIAGNOSIS — L89321 Pressure ulcer of left buttock, stage 1: Secondary | ICD-10-CM | POA: Diagnosis not present

## 2019-02-04 DIAGNOSIS — R5381 Other malaise: Secondary | ICD-10-CM | POA: Diagnosis not present

## 2019-02-04 DIAGNOSIS — J441 Chronic obstructive pulmonary disease with (acute) exacerbation: Secondary | ICD-10-CM | POA: Diagnosis not present

## 2019-02-04 DIAGNOSIS — J9612 Chronic respiratory failure with hypercapnia: Secondary | ICD-10-CM | POA: Diagnosis not present

## 2019-02-04 DIAGNOSIS — M109 Gout, unspecified: Secondary | ICD-10-CM | POA: Diagnosis not present

## 2019-02-04 DIAGNOSIS — I700 Atherosclerosis of aorta: Secondary | ICD-10-CM | POA: Diagnosis not present

## 2019-02-04 DIAGNOSIS — E785 Hyperlipidemia, unspecified: Secondary | ICD-10-CM | POA: Diagnosis not present

## 2019-02-05 ENCOUNTER — Telehealth: Payer: Self-pay | Admitting: Family Medicine

## 2019-02-05 DIAGNOSIS — R5381 Other malaise: Secondary | ICD-10-CM | POA: Diagnosis not present

## 2019-02-05 DIAGNOSIS — J9612 Chronic respiratory failure with hypercapnia: Secondary | ICD-10-CM | POA: Diagnosis not present

## 2019-02-05 DIAGNOSIS — J90 Pleural effusion, not elsewhere classified: Secondary | ICD-10-CM | POA: Diagnosis not present

## 2019-02-05 DIAGNOSIS — I700 Atherosclerosis of aorta: Secondary | ICD-10-CM | POA: Diagnosis not present

## 2019-02-05 DIAGNOSIS — I10 Essential (primary) hypertension: Secondary | ICD-10-CM | POA: Diagnosis not present

## 2019-02-05 DIAGNOSIS — J441 Chronic obstructive pulmonary disease with (acute) exacerbation: Secondary | ICD-10-CM | POA: Diagnosis not present

## 2019-02-05 DIAGNOSIS — M109 Gout, unspecified: Secondary | ICD-10-CM | POA: Diagnosis not present

## 2019-02-05 DIAGNOSIS — L89321 Pressure ulcer of left buttock, stage 1: Secondary | ICD-10-CM | POA: Diagnosis not present

## 2019-02-05 DIAGNOSIS — I7300 Raynaud's syndrome without gangrene: Secondary | ICD-10-CM | POA: Diagnosis not present

## 2019-02-05 DIAGNOSIS — E785 Hyperlipidemia, unspecified: Secondary | ICD-10-CM | POA: Diagnosis not present

## 2019-02-05 NOTE — Telephone Encounter (Signed)
[place] Nurse called and states that the gabapentin is not helping with her leg pain at night and wanted to know if there was something else she could take? She is taking per [employer] 600mg  QHS.

## 2019-02-06 ENCOUNTER — Other Ambulatory Visit: Payer: Self-pay | Admitting: Family Medicine

## 2019-02-06 DIAGNOSIS — L89321 Pressure ulcer of left buttock, stage 1: Secondary | ICD-10-CM | POA: Diagnosis not present

## 2019-02-06 DIAGNOSIS — I10 Essential (primary) hypertension: Secondary | ICD-10-CM | POA: Diagnosis not present

## 2019-02-06 DIAGNOSIS — M109 Gout, unspecified: Secondary | ICD-10-CM | POA: Diagnosis not present

## 2019-02-06 DIAGNOSIS — I7300 Raynaud's syndrome without gangrene: Secondary | ICD-10-CM | POA: Diagnosis not present

## 2019-02-06 DIAGNOSIS — J441 Chronic obstructive pulmonary disease with (acute) exacerbation: Secondary | ICD-10-CM | POA: Diagnosis not present

## 2019-02-06 DIAGNOSIS — E785 Hyperlipidemia, unspecified: Secondary | ICD-10-CM | POA: Diagnosis not present

## 2019-02-06 DIAGNOSIS — J90 Pleural effusion, not elsewhere classified: Secondary | ICD-10-CM | POA: Diagnosis not present

## 2019-02-06 DIAGNOSIS — J9612 Chronic respiratory failure with hypercapnia: Secondary | ICD-10-CM | POA: Diagnosis not present

## 2019-02-06 DIAGNOSIS — I700 Atherosclerosis of aorta: Secondary | ICD-10-CM | POA: Diagnosis not present

## 2019-02-06 DIAGNOSIS — R5381 Other malaise: Secondary | ICD-10-CM | POA: Diagnosis not present

## 2019-02-06 MED ORDER — TRAMADOL HCL 50 MG PO TABS: 50.0000 mg | ORAL_TABLET | Freq: Three times a day (TID) | ORAL | 0 refills | Status: AC | PRN

## 2019-02-06 NOTE — Telephone Encounter (Signed)
Pt's daughter aware.

## 2019-02-06 NOTE — Telephone Encounter (Signed)
Lets try adding tramadol 50 mg po q 8 hrs prn pain

## 2019-02-07 ENCOUNTER — Emergency Department (HOSPITAL_COMMUNITY): Payer: Medicare HMO

## 2019-02-07 ENCOUNTER — Inpatient Hospital Stay (HOSPITAL_COMMUNITY)
Admission: EM | Admit: 2019-02-07 | Discharge: 2019-02-10 | DRG: 177 | Disposition: A | Discharge status: Home Health | Payer: Medicare HMO | Attending: Internal Medicine | Admitting: Internal Medicine

## 2019-02-07 ENCOUNTER — Encounter (HOSPITAL_COMMUNITY): Payer: Self-pay | Admitting: Emergency Medicine

## 2019-02-07 ENCOUNTER — Other Ambulatory Visit: Payer: Self-pay

## 2019-02-07 DIAGNOSIS — J441 Chronic obstructive pulmonary disease with (acute) exacerbation: Secondary | ICD-10-CM | POA: Diagnosis present

## 2019-02-07 DIAGNOSIS — I700 Atherosclerosis of aorta: Secondary | ICD-10-CM | POA: Diagnosis present

## 2019-02-07 DIAGNOSIS — Z833 Family history of diabetes mellitus: Secondary | ICD-10-CM

## 2019-02-07 DIAGNOSIS — Z90710 Acquired absence of both cervix and uterus: Secondary | ICD-10-CM

## 2019-02-07 DIAGNOSIS — R0602 Shortness of breath: Secondary | ICD-10-CM | POA: Diagnosis not present

## 2019-02-07 DIAGNOSIS — Z515 Encounter for palliative care: Secondary | ICD-10-CM | POA: Diagnosis not present

## 2019-02-07 DIAGNOSIS — R296 Repeated falls: Secondary | ICD-10-CM | POA: Diagnosis present

## 2019-02-07 DIAGNOSIS — Z8701 Personal history of pneumonia (recurrent): Secondary | ICD-10-CM

## 2019-02-07 DIAGNOSIS — I720 Aneurysm of carotid artery: Secondary | ICD-10-CM | POA: Diagnosis not present

## 2019-02-07 DIAGNOSIS — E872 Acidosis: Secondary | ICD-10-CM | POA: Diagnosis not present

## 2019-02-07 DIAGNOSIS — R509 Fever, unspecified: Secondary | ICD-10-CM | POA: Diagnosis not present

## 2019-02-07 DIAGNOSIS — R29810 Facial weakness: Secondary | ICD-10-CM | POA: Diagnosis not present

## 2019-02-07 DIAGNOSIS — G9341 Metabolic encephalopathy: Secondary | ICD-10-CM | POA: Diagnosis present

## 2019-02-07 DIAGNOSIS — R911 Solitary pulmonary nodule: Secondary | ICD-10-CM | POA: Diagnosis present

## 2019-02-07 DIAGNOSIS — J9622 Acute and chronic respiratory failure with hypercapnia: Secondary | ICD-10-CM | POA: Diagnosis present

## 2019-02-07 DIAGNOSIS — J90 Pleural effusion, not elsewhere classified: Secondary | ICD-10-CM | POA: Diagnosis present

## 2019-02-07 DIAGNOSIS — M810 Age-related osteoporosis without current pathological fracture: Secondary | ICD-10-CM | POA: Diagnosis present

## 2019-02-07 DIAGNOSIS — Z8249 Family history of ischemic heart disease and other diseases of the circulatory system: Secondary | ICD-10-CM

## 2019-02-07 DIAGNOSIS — Z66 Do not resuscitate: Secondary | ICD-10-CM | POA: Diagnosis not present

## 2019-02-07 DIAGNOSIS — Z20828 Contact with and (suspected) exposure to other viral communicable diseases: Secondary | ICD-10-CM | POA: Diagnosis not present

## 2019-02-07 DIAGNOSIS — J189 Pneumonia, unspecified organism: Secondary | ICD-10-CM

## 2019-02-07 DIAGNOSIS — Z7189 Other specified counseling: Secondary | ICD-10-CM | POA: Diagnosis not present

## 2019-02-07 DIAGNOSIS — H9190 Unspecified hearing loss, unspecified ear: Secondary | ICD-10-CM | POA: Diagnosis present

## 2019-02-07 DIAGNOSIS — Z87891 Personal history of nicotine dependence: Secondary | ICD-10-CM

## 2019-02-07 DIAGNOSIS — Z7983 Long term (current) use of bisphosphonates: Secondary | ICD-10-CM

## 2019-02-07 DIAGNOSIS — J9621 Acute and chronic respiratory failure with hypoxia: Secondary | ICD-10-CM

## 2019-02-07 DIAGNOSIS — Z6830 Body mass index (BMI) 30.0-30.9, adult: Secondary | ICD-10-CM

## 2019-02-07 DIAGNOSIS — Z8541 Personal history of malignant neoplasm of cervix uteri: Secondary | ICD-10-CM

## 2019-02-07 DIAGNOSIS — J9811 Atelectasis: Secondary | ICD-10-CM | POA: Diagnosis not present

## 2019-02-07 DIAGNOSIS — R4182 Altered mental status, unspecified: Secondary | ICD-10-CM | POA: Diagnosis not present

## 2019-02-07 DIAGNOSIS — E669 Obesity, unspecified: Secondary | ICD-10-CM | POA: Diagnosis present

## 2019-02-07 DIAGNOSIS — T17908A Unspecified foreign body in respiratory tract, part unspecified causing other injury, initial encounter: Secondary | ICD-10-CM | POA: Diagnosis present

## 2019-02-07 DIAGNOSIS — E041 Nontoxic single thyroid nodule: Secondary | ICD-10-CM | POA: Diagnosis present

## 2019-02-07 DIAGNOSIS — Z9981 Dependence on supplemental oxygen: Secondary | ICD-10-CM

## 2019-02-07 DIAGNOSIS — Z79899 Other long term (current) drug therapy: Secondary | ICD-10-CM

## 2019-02-07 DIAGNOSIS — T17908D Unspecified foreign body in respiratory tract, part unspecified causing other injury, subsequent encounter: Secondary | ICD-10-CM | POA: Diagnosis not present

## 2019-02-07 DIAGNOSIS — R0902 Hypoxemia: Secondary | ICD-10-CM | POA: Diagnosis not present

## 2019-02-07 DIAGNOSIS — E119 Type 2 diabetes mellitus without complications: Secondary | ICD-10-CM | POA: Diagnosis not present

## 2019-02-07 DIAGNOSIS — I7300 Raynaud's syndrome without gangrene: Secondary | ICD-10-CM | POA: Diagnosis present

## 2019-02-07 DIAGNOSIS — E785 Hyperlipidemia, unspecified: Secondary | ICD-10-CM | POA: Diagnosis present

## 2019-02-07 DIAGNOSIS — J690 Pneumonitis due to inhalation of food and vomit: Secondary | ICD-10-CM | POA: Diagnosis not present

## 2019-02-07 DIAGNOSIS — I10 Essential (primary) hypertension: Secondary | ICD-10-CM | POA: Diagnosis present

## 2019-02-07 DIAGNOSIS — Z79891 Long term (current) use of opiate analgesic: Secondary | ICD-10-CM

## 2019-02-07 DIAGNOSIS — R404 Transient alteration of awareness: Secondary | ICD-10-CM | POA: Diagnosis not present

## 2019-02-07 DIAGNOSIS — M109 Gout, unspecified: Secondary | ICD-10-CM | POA: Diagnosis present

## 2019-02-07 DIAGNOSIS — R1310 Dysphagia, unspecified: Secondary | ICD-10-CM | POA: Diagnosis present

## 2019-02-07 DIAGNOSIS — E1165 Type 2 diabetes mellitus with hyperglycemia: Secondary | ICD-10-CM | POA: Diagnosis not present

## 2019-02-07 DIAGNOSIS — Z85038 Personal history of other malignant neoplasm of large intestine: Secondary | ICD-10-CM

## 2019-02-07 DIAGNOSIS — Z9049 Acquired absence of other specified parts of digestive tract: Secondary | ICD-10-CM

## 2019-02-07 DIAGNOSIS — I4891 Unspecified atrial fibrillation: Secondary | ICD-10-CM | POA: Diagnosis not present

## 2019-02-07 LAB — LACTIC ACID, PLASMA
Lactic Acid, Venous: 1.9 mmol/L (ref 0.5–1.9)
Lactic Acid, Venous: 1.8 mmol/L (ref 0.5–1.9)

## 2019-02-07 LAB — CBC
HCT: 30.7 % — ABNORMAL LOW (ref 36.0–46.0)
Hemoglobin: 9.2 g/dL — ABNORMAL LOW (ref 12.0–15.0)
MCH: 27.7 pg (ref 26.0–34.0)
MCHC: 30 g/dL (ref 30.0–36.0)
MCV: 92.5 fL (ref 80.0–100.0)
Platelets: 188 10*3/uL (ref 150–400)
RBC: 3.32 MIL/uL — ABNORMAL LOW (ref 3.87–5.11)
RDW: 14.3 % (ref 11.5–15.5)
WBC: 5.5 10*3/uL (ref 4.0–10.5)
nRBC: 0 % (ref 0.0–0.2)

## 2019-02-07 LAB — SARS CORONAVIRUS 2 BY RT PCR (HOSPITAL ORDER, PERFORMED IN ~~LOC~~ HOSPITAL LAB): SARS Coronavirus 2: NEGATIVE

## 2019-02-07 LAB — RAPID URINE DRUG SCREEN, HOSP PERFORMED
Amphetamines: NOT DETECTED
Barbiturates: NOT DETECTED
Benzodiazepines: NOT DETECTED
Cocaine: NOT DETECTED
Opiates: NOT DETECTED
Tetrahydrocannabinol: NOT DETECTED

## 2019-02-07 LAB — URINALYSIS, ROUTINE W REFLEX MICROSCOPIC
Bilirubin Urine: NEGATIVE
Glucose, UA: 50 mg/dL — AB
Hgb urine dipstick: NEGATIVE
Ketones, ur: NEGATIVE mg/dL
Leukocytes,Ua: NEGATIVE
Nitrite: NEGATIVE
Protein, ur: 30 mg/dL — AB
Specific Gravity, Urine: 1.019 (ref 1.005–1.030)
pH: 5 (ref 5.0–8.0)

## 2019-02-07 LAB — COMPREHENSIVE METABOLIC PANEL
ALT: 20 U/L (ref 0–44)
AST: 20 U/L (ref 15–41)
Albumin: 3 g/dL — ABNORMAL LOW (ref 3.5–5.0)
Alkaline Phosphatase: 71 U/L (ref 38–126)
Anion gap: 8 (ref 5–15)
BUN: 27 mg/dL — ABNORMAL HIGH (ref 8–23)
CO2: 38 mmol/L — ABNORMAL HIGH (ref 22–32)
Calcium: 8.7 mg/dL — ABNORMAL LOW (ref 8.9–10.3)
Chloride: 92 mmol/L — ABNORMAL LOW (ref 98–111)
Creatinine, Ser: 0.96 mg/dL (ref 0.44–1.00)
GFR calc Af Amer: >60 mL/min (ref >60)
GFR calc non Af Amer: 54 mL/min — ABNORMAL LOW (ref >60)
Glucose, Bld: 249 mg/dL — ABNORMAL HIGH (ref 70–99)
Potassium: 4.9 mmol/L (ref 3.5–5.1)
Sodium: 138 mmol/L (ref 135–145)
Total Bilirubin: 0.4 mg/dL (ref 0.3–1.2)
Total Protein: 6 g/dL — ABNORMAL LOW (ref 6.5–8.1)

## 2019-02-07 LAB — DIFFERENTIAL
Abs Immature Granulocytes: 0.05 10*3/uL (ref 0.00–0.07)
Basophils Absolute: 0 10*3/uL (ref 0.0–0.1)
Basophils Relative: 1 %
Eosinophils Absolute: 0.1 10*3/uL (ref 0.0–0.5)
Eosinophils Relative: 1 %
Immature Granulocytes: 1 %
Lymphocytes Relative: 22 %
Lymphs Abs: 1.2 10*3/uL (ref 0.7–4.0)
Monocytes Absolute: 0.5 10*3/uL (ref 0.1–1.0)
Monocytes Relative: 9 %
Neutro Abs: 3.6 10*3/uL (ref 1.7–7.7)
Neutrophils Relative %: 66 %

## 2019-02-07 LAB — PROTIME-INR
INR: 1.1 (ref 0.8–1.2)
Prothrombin Time: 13.7 seconds (ref 11.4–15.2)

## 2019-02-07 LAB — BLOOD GAS, ARTERIAL
Acid-Base Excess: 13.6 mmol/L — ABNORMAL HIGH (ref 0.0–2.0)
Bicarbonate: 36.1 mmol/L — ABNORMAL HIGH (ref 20.0–28.0)
FIO2: 36
O2 Saturation: 99.4 %
Patient temperature: 37
pCO2 arterial: 72.8 mmHg (ref 32.0–48.0)
pH, Arterial: 7.355 (ref 7.350–7.450)
pO2, Arterial: 196 mmHg — ABNORMAL HIGH (ref 83.0–108.0)

## 2019-02-07 LAB — TROPONIN I
Troponin I: 0.05 ng/mL (ref <0.03)
Troponin I: 0.05 ng/mL (ref <0.03)

## 2019-02-07 LAB — ETHANOL: Alcohol, Ethyl (B): <10 mg/dL (ref <10)

## 2019-02-07 LAB — I-STAT CREATININE, ED: Creatinine, Ser: 1.1 mg/dL — ABNORMAL HIGH (ref 0.44–1.00)

## 2019-02-07 LAB — GLUCOSE, CAPILLARY: Glucose-Capillary: 127 mg/dL — ABNORMAL HIGH (ref 70–99)

## 2019-02-07 LAB — APTT: aPTT: 39 seconds — ABNORMAL HIGH (ref 24–36)

## 2019-02-07 MED ORDER — PIPERACILLIN-TAZOBACTAM 3.375 G IVPB 30 MIN
3.3750 g | Freq: Once | INTRAVENOUS | Status: AC
  Administered 2019-02-07: 3.375 g via INTRAVENOUS
  Filled 2019-02-07: qty 50

## 2019-02-07 MED ORDER — ENOXAPARIN SODIUM 40 MG/0.4ML ~~LOC~~ SOLN
40.0000 mg | SUBCUTANEOUS | Status: DC
  Administered 2019-02-07 – 2019-02-09 (×3): 40 mg via SUBCUTANEOUS
  Filled 2019-02-07 (×3): qty 0.4

## 2019-02-07 MED ORDER — UMECLIDINIUM-VILANTEROL 62.5-25 MCG/INH IN AEPB
1.0000 | INHALATION_SPRAY | Freq: Every day | RESPIRATORY_TRACT | Status: DC
  Administered 2019-02-09 – 2019-02-10 (×2): 1 via RESPIRATORY_TRACT
  Filled 2019-02-07: qty 14

## 2019-02-07 MED ORDER — ROSUVASTATIN CALCIUM 20 MG PO TABS
20.0000 mg | ORAL_TABLET | Freq: Every day | ORAL | Status: DC
  Administered 2019-02-08 – 2019-02-10 (×3): 20 mg via ORAL
  Filled 2019-02-07 (×3): qty 1

## 2019-02-07 MED ORDER — ACETAMINOPHEN 500 MG PO TABS
1000.0000 mg | ORAL_TABLET | Freq: Once | ORAL | Status: DC
  Filled 2019-02-07: qty 2

## 2019-02-07 MED ORDER — ACETAMINOPHEN 325 MG RE SUPP
RECTAL | Status: AC
  Administered 2019-02-07: 325 mg
  Filled 2019-02-07: qty 1

## 2019-02-07 MED ORDER — INSULIN ASPART 100 UNIT/ML ~~LOC~~ SOLN
0.0000 [IU] | Freq: Three times a day (TID) | SUBCUTANEOUS | Status: DC
  Administered 2019-02-08: 1 [IU] via SUBCUTANEOUS
  Administered 2019-02-08 (×2): 2 [IU] via SUBCUTANEOUS
  Administered 2019-02-09: 3 [IU] via SUBCUTANEOUS
  Administered 2019-02-09: 2 [IU] via SUBCUTANEOUS
  Administered 2019-02-09: 1 [IU] via SUBCUTANEOUS
  Administered 2019-02-10: 2 [IU] via SUBCUTANEOUS
  Administered 2019-02-10: 1 [IU] via SUBCUTANEOUS

## 2019-02-07 MED ORDER — POLYETHYLENE GLYCOL 3350 17 G PO PACK: 17.0000 g | PACK | Freq: Every day | ORAL | Status: DC | PRN

## 2019-02-07 MED ORDER — ACETAMINOPHEN 650 MG RE SUPP
975.0000 mg | Freq: Once | RECTAL | Status: AC
  Administered 2019-02-07: 650 mg via RECTAL

## 2019-02-07 MED ORDER — IOHEXOL 350 MG/ML SOLN
75.0000 mL | Freq: Once | INTRAVENOUS | Status: AC | PRN
  Administered 2019-02-07: 75 mL via INTRAVENOUS

## 2019-02-07 MED ORDER — SODIUM CHLORIDE 0.9 % IV SOLN
INTRAVENOUS | Status: AC
  Administered 2019-02-07: 22:00:00 via INTRAVENOUS

## 2019-02-07 MED ORDER — PIPERACILLIN-TAZOBACTAM 3.375 G IVPB
3.3750 g | Freq: Three times a day (TID) | INTRAVENOUS | Status: DC
  Administered 2019-02-07 – 2019-02-08 (×3): 3.375 g via INTRAVENOUS
  Filled 2019-02-07 (×3): qty 50

## 2019-02-07 MED ORDER — ACETAMINOPHEN 650 MG RE SUPP
RECTAL | Status: AC
  Filled 2019-02-07: qty 1

## 2019-02-07 MED ORDER — IPRATROPIUM-ALBUTEROL 0.5-2.5 (3) MG/3ML IN SOLN: 3.0000 mL | RESPIRATORY_TRACT | Status: DC | PRN

## 2019-02-07 MED ORDER — SODIUM CHLORIDE 0.9 % IV BOLUS
1000.0000 mL | Freq: Once | INTRAVENOUS | Status: AC
  Administered 2019-02-07: 1000 mL via INTRAVENOUS

## 2019-02-07 MED ORDER — ONDANSETRON HCL 4 MG PO TABS: 4.0000 mg | ORAL_TABLET | Freq: Four times a day (QID) | ORAL | Status: DC | PRN

## 2019-02-07 MED ORDER — ONDANSETRON HCL 4 MG/2ML IJ SOLN: 4.0000 mg | Freq: Four times a day (QID) | INTRAMUSCULAR | Status: DC | PRN

## 2019-02-07 NOTE — ED Notes (Signed)
Call from lab- critical   PCO2  72.8  Dr Lemmie Evens informed

## 2019-02-07 NOTE — Progress Notes (Signed)
Pharmacy Antibiotic Note  Emily Owen is a 85 y.o. female admitted on 02/07/2019 with sepsis.  Pharmacy has been consulted for zosyn dosing.  Plan: Zosyn 3.375g IV q8h (4 hour infusion).  Height: 5\' 4"  (162.6 cm) Weight: 175 lb 7.8 oz (79.6 kg) IBW/kg (Calculated) : 54.7  Temp (24hrs), Avg:99.3 F (37.4 C), Min:98.3 F (36.8 C), Max:101.5 F (38.6 C)  Recent Labs  Lab 02/07/19 1348 02/07/19 1414 02/07/19 1813  WBC 5.5  --   --   CREATININE 0.96 1.10*  --   LATICACIDVEN 1.9  --  1.8    Estimated Creatinine Clearance: 37.5 mL/min (A) (by C-G formula based on SCr of 1.1 mg/dL (H)).    No Known Allergies  Antimicrobials this admission: 4/18 zosyn >>  Microbiology results: 4/18 BCx: sent 4/18 UCx: sent  4/18 MRSA PCR: sent   Thank you for allowing pharmacy to be a part of this patient's care.  Donna Christen Sherrine Salberg 02/07/2019 8:54 PM

## 2019-02-07 NOTE — H&P (Addendum)
History and Physical    Emily Owen KLK:[id number] DOB: 1930-06-04 DOA: 02/07/2019  PCP: [name], MD   Patient coming from: home  I have personally briefly reviewed patient's old medical records in [place]  Chief Complaint: AMS  HPI: Emily Owen is a 85 y.o. female with medical history significant for acute on chronic respiratory failure with hypoxia chronic 3 L O2 at home, COPD, hypertension, DM, who was brought to the ED via EMS with reports of change in mental status.  Patient refused sides with her daughter Emily Owen but has a second daughter called Emily Owen.  Per patient's daughter Emily Owen, last night patient was refusing to wear her home O2 refused to wear it for at least 7 hours..  She was also complaining of some chest pain but refused to come to the ED.  This morning patient was hanging of her recliner, and would not sit up straight.  Daughter was finally able to put O2 on patient later today.  Sats were not checked while patient was off O2. With decrease in patient's responsiveness, EMS was called. Also reported by EMS and ED provider was that patient had a facial droop. Both daughters deny seeing any facial droop. At the time of my evaluation patient was on BiPAP with eyes closed but responds to voice, becomes very much awake.  Answers simple questions appropriately, and co-operates with exam.  She denies Pain.  Recent hospitalization- 3/25- 3/28 -severe sepsis secondary to right lower lobe pneumonia.  Subsequently discharged to skilled nursing facility.  ED Course: Febrile to 101.5.  Blood pressure systolic 979- 480.  Normal WBC 5.5, chronic anemia 9.2.  Serum bicarb high at 38-1 week, lactic acid 1.9.  UA rare bacteria.  Mildly elevated troponin 0 0.05.  Gait safe.  Head CT negative for acute abnormality, CTA head and neck done for concern for possible CVA, negative for emergent large vessel occlusion or arterial stenosis in head and neck.  Chest CT shows small  right pleural effusion with bibasilar atelectasis.  Incidental finding of pulmonary nodule.   Review of Systems: As per HPI all other systems reviewed and negative.  Past Medical History:  Diagnosis Date  . Cancer ([place])    colon  . Cancer of cervix ([place])    cervix  . COPD (chronic obstructive pulmonary disease) ([place])   . Diabetes mellitus without complication ([place])   . Gallstones   . Gout   . Hard of hearing   . On home O2   . Osteoporosis   . Pleural effusion   . Raynaud's disease     Past Surgical History:  Procedure Laterality Date  . ABDOMINAL HYSTERECTOMY     for cervical cancer 1965  . COLON SURGERY     hemicolectomy 1998  . gallstone surgery     ERCP for gallstone pancreatitis  . ORTHOPEDIC SURGERY       reports that she quit smoking about 24 years ago. Her smoking use included cigarettes. She has a 70.00 pack-year smoking history. She has never used smokeless tobacco. She reports that she does not drink alcohol or use drugs.  No Known Allergies  Family History  Problem Relation Age of Onset  . Diabetes Sister   . Diabetes Sister   . Diabetes Sister   . Hypertension Mother   . Heart disease Mother   . Diabetes Sister     Prior to Admission medications   Medication Sig Start Date End Date Taking? Authorizing Provider  alendronate (FOSAMAX) 70 MG tablet TAKE 1 TABLET BY MOUTH EVERY 7 DAYS. TAKE WITH A FULL GLASS OF WATER ON AN EMPTY STOMACH. 11/13/18  Yes [name], MD  amLODipine (NORVASC) 5 MG tablet Take 1 tablet (5 mg total) by mouth daily. 03/05/18  Yes [name], MD  ANORO ELLIPTA 62.5-25 MCG/INH AEPB TAKE 1 PUFF BY MOUTH EVERY DAY 12/09/18  Yes Juanito Doom, MD  Balsam [place]) OINT Apply to bilateral buttocks and sacrum qshift and prn for shear to buttocks and prevention. Every shift   Yes [provider]  gabapentin (NEURONTIN) 100 MG capsule Take 200 mg by mouth at bedtime.   Yes [provider]   Ipratropium-Albuterol (COMBIVENT RESPIMAT) 20-100 MCG/ACT AERS respimat Inhale 1 puff into the lungs every 4 (four) hours as needed for wheezing.   Yes [provider]  OXYGEN Inhale 3 L into the lungs continuous. [employer]   Yes [provider]  Respiratory Therapy Supplies (FLUTTER) DEVI Use as directed. 10/01/18  Yes Parrett, Tammy S, NP  rosuvastatin (CRESTOR) 20 MG tablet Take 1 tablet (20 mg total) by mouth daily. 02/27/18  Yes [name], MD  traMADol (ULTRAM) 50 MG tablet Take 1 tablet (50 mg total) by mouth every 8 (eight) hours as needed for up to 5 days. 02/06/19 02/11/19 Yes [name], MD  colchicine 0.6 MG tablet Take 0.6 mg by mouth 2 (two) times daily as needed. 01/17/19   [provider]  potassium chloride SA (K-DUR,KLOR-CON) 20 MEQ tablet Take 1 tablet (20 mEq total) by mouth daily for 5 days. 01/27/19 02/01/19  [name], MD    Physical Exam: Vitals:   02/07/19 1630 02/07/19 1638 02/07/19 1700 02/07/19 1730  BP: (!) 111/59  (!) 107/58 102/65  Pulse: 89 82 82 78  Resp: 16 15 14 16   Temp:      TempSrc:      SpO2: 100% 100% 96% 94%  Weight:      Height:        Constitutional: NAD, calm, comfortable, on BiPAP Vitals:   02/07/19 1630 02/07/19 1638 02/07/19 1700 02/07/19 1730  BP: (!) 111/59  (!) 107/58 102/65  Pulse: 89 82 82 78  Resp: 16 15 14 16   Temp:      TempSrc:      SpO2: 100% 100% 96% 94%  Weight:      Height:       Eyes: PERRL, lids and conjunctivae normal ENMT: On BIPAP,  Neck: normal, supple, no masses, no thyromegaly Respiratory: clear to auscultation bilaterally, no wheezing, no crackles. Normal respiratory effort. No accessory muscle use.  Cardiovascular: Regular rate and rhythm, no murmurs / rubs / gallops. No extremity edema. 2+ pedal pulses.  Abdomen: no tenderness, no masses palpated. No hepatosplenomegaly. Bowel sounds positive.  Musculoskeletal: no clubbing / cyanosis. No joint deformity upper and  lower extremities. Good ROM, no contractures. Normal muscle tone.  Skin: no rashes, lesions, ulcers. No induration Neurologic: Reexamined after BiPAP was removed, her right nasolabial fold is slightly more prominent than the left at rest but her smile is symmetric, with intact cranial nerves II through XII, sensation intact, Strength 5/5 in all 4.  Psychiatric: Normal judgment and insight. Alert and oriented x 3. Normal mood.   Labs on Admission: I have personally reviewed following labs and imaging studies  CBC: Recent Labs  Lab 02/07/19 1348  WBC 5.5  NEUTROABS 3.6  HGB 9.2*  HCT 30.7*  MCV 92.5  PLT 154   Basic Metabolic Panel: Recent Labs  Lab 02/07/19 1348 02/07/19 1414  NA 138  --   K 4.9  --   CL 92*  --   CO2 38*  --   GLUCOSE 249*  --   BUN 27*  --   CREATININE 0.96 1.10*  CALCIUM 8.7*  --    Liver Function Tests: Recent Labs  Lab 02/07/19 1348  AST 20  ALT 20  ALKPHOS 71  BILITOT 0.4  PROT 6.0*  ALBUMIN 3.0*   Coagulation Profile: Recent Labs  Lab 02/07/19 1348  INR 1.1   Cardiac Enzymes: Recent Labs  Lab 02/07/19 1348  TROPONINI 0.05*   Urine analysis:    Component Value Date/Time   COLORURINE YELLOW 02/07/2019 1324   APPEARANCEUR CLOUDY (A) 02/07/2019 1324   LABSPEC 1.019 02/07/2019 1324   PHURINE 5.0 02/07/2019 1324   GLUCOSEU 50 (A) 02/07/2019 1324   HGBUR NEGATIVE 02/07/2019 1324   BILIRUBINUR NEGATIVE 02/07/2019 1324   KETONESUR NEGATIVE 02/07/2019 1324   PROTEINUR 30 (A) 02/07/2019 1324   UROBILINOGEN 0.2 07/11/2015 1149   NITRITE NEGATIVE 02/07/2019 1324   LEUKOCYTESUR NEGATIVE 02/07/2019 1324    Radiological Exams on Admission: Ct Angio Head W Or Wo Contrast  Result Date: 02/07/2019 CLINICAL DATA:  85 year old female with altered mental status, confusion. EXAM: CT ANGIOGRAPHY HEAD AND NECK TECHNIQUE: Multidetector CT imaging of the head and neck was performed using the standard protocol during bolus administration of  intravenous contrast. Multiplanar CT image reconstructions and MIPs were obtained to evaluate the vascular anatomy. Carotid stenosis measurements (when applicable) are obtained utilizing NASCET criteria, using the distal internal carotid diameter as the denominator. CONTRAST:  61mL OMNIPAQUE IOHEXOL 350 MG/ML SOLN COMPARISON:  Head CT without contrast 1329 hours today. Chest CTA today reported separately. CT head and cervical spine 05/31/2015. FINDINGS: CTA NECK Skeleton: Absent dentition. Osteopenia. No acute osseous abnormality identified. Upper chest: Reported separately today. Other neck: Mixed density 15 millimeter right thyroid nodule, stable to minimally increased since 2016 and significance doubtful. Chronic asymmetric density along the posterior right vocal fold is stable since 2016 and probably postoperative in nature. Otherwise negative. Aortic arch: 3 vessel arch configuration. Mild for age Calcified aortic atherosclerosis. Right carotid system: Mild plaque at the brachiocephalic artery and right CCA origin without stenosis. Mildly tortuous right CCA. Minimal plaque at the right carotid bifurcation. Mild tortuosity of the cervical right ICA without stenosis. Left carotid system: Mild plaque at the left CCA origin without stenosis. Minimal to mild plaque at the left carotid bifurcation without stenosis. Retropharyngeal course of the left ICA with mild tortuosity just below the skull base. Vertebral arteries: Mild proximal right subclavian artery plaque with no stenosis there or at the right vertebral artery origin. Patent and negative right vertebral artery to the skull base. Proximal left subclavian artery plaque without stenosis. Normal left vertebral artery origin. Codominant vertebral arteries, the left is patent to the skull base without stenosis. CTA HEAD Posterior circulation: Codominant and normal distal vertebral arteries. Normal PICA origins and vertebrobasilar junction. Patent basilar artery  without stenosis. Fetal type bilateral PCA origins. Patent SCA and AICA origins. Mildly tortuous posterior communicating arteries. Bilateral PCA branches are within normal limits. Anterior circulation:  Both ICA siphons are patent. Mild calcified plaque on the left without stenosis. Normal left ophthalmic and posterior communicating artery origins, although opposite the left Pcomm origin elected directed cephalad near the left ICA terminus there is a subtle 2-3  millimeter aneurysm with a broad neck best demonstrated on series 8, image 115. See also series 7, image 91. Right ICA siphon is patent with mild ectasia and minimal plaque. Normal right posterior communicating artery origin. Patent carotid termini. Normal MCA and ACA origins. Anterior communicating artery and bilateral ACA branches are within normal limits. Left MCA M1 segment and trifurcation are patent without stenosis. Left MCA branches are within normal limits. Right MCA M1 and bifurcation are patent without stenosis. Right MCA branches are within normal limits. Venous sinuses: Patent. Anatomic variants: Fetal type bilateral PCA origins. Review of the MIP images confirms the above findings IMPRESSION: 1. Negative for emergent large vessel occlusion or arterial stenosis in the head or neck. 2. Positive for a 3 mm Left ICA terminus aneurysm, subtle and directed cephalad opposite the left posterior communicating artery origin. 3. Chest CT today reported separately. Electronically Signed   By: Genevie Ann M.D.   On: 02/07/2019 16:59   Ct Head Wo Contrast  Result Date: 02/07/2019 CLINICAL DATA:  Altered mental status EXAM: CT HEAD WITHOUT CONTRAST TECHNIQUE: Contiguous axial images were obtained from the base of the skull through the vertex without intravenous contrast. COMPARISON:  11/08/2018 FINDINGS: Brain: No evidence of acute infarction, hemorrhage, hydrocephalus, extra-axial collection or mass lesion/mass effect. Periventricular white matter low  attenuation as can be seen with microvascular disease. Mild generalized cerebral atrophy. Vascular: No hyperdense vessel or unexpected calcification. Skull: No osseous abnormality. Sinuses/Orbits: Visualized paranasal sinuses are clear. Visualized mastoid sinuses are clear. Visualized orbits demonstrate no focal abnormality. Other: None IMPRESSION: No acute intracranial pathology. Electronically Signed   By: Kathreen Devoid   On: 02/07/2019 13:49   Ct Angio Neck W And/or Wo Contrast  Result Date: 02/07/2019 CLINICAL DATA:  85 year old female with altered mental status, confusion. EXAM: CT ANGIOGRAPHY HEAD AND NECK TECHNIQUE: Multidetector CT imaging of the head and neck was performed using the standard protocol during bolus administration of intravenous contrast. Multiplanar CT image reconstructions and MIPs were obtained to evaluate the vascular anatomy. Carotid stenosis measurements (when applicable) are obtained utilizing NASCET criteria, using the distal internal carotid diameter as the denominator. CONTRAST:  52mL OMNIPAQUE IOHEXOL 350 MG/ML SOLN COMPARISON:  Head CT without contrast 1329 hours today. Chest CTA today reported separately. CT head and cervical spine 05/31/2015. FINDINGS: CTA NECK Skeleton: Absent dentition. Osteopenia. No acute osseous abnormality identified. Upper chest: Reported separately today. Other neck: Mixed density 15 millimeter right thyroid nodule, stable to minimally increased since 2016 and significance doubtful. Chronic asymmetric density along the posterior right vocal fold is stable since 2016 and probably postoperative in nature. Otherwise negative. Aortic arch: 3 vessel arch configuration. Mild for age Calcified aortic atherosclerosis. Right carotid system: Mild plaque at the brachiocephalic artery and right CCA origin without stenosis. Mildly tortuous right CCA. Minimal plaque at the right carotid bifurcation. Mild tortuosity of the cervical right ICA without stenosis. Left  carotid system: Mild plaque at the left CCA origin without stenosis. Minimal to mild plaque at the left carotid bifurcation without stenosis. Retropharyngeal course of the left ICA with mild tortuosity just below the skull base. Vertebral arteries: Mild proximal right subclavian artery plaque with no stenosis there or at the right vertebral artery origin. Patent and negative right vertebral artery to the skull base. Proximal left subclavian artery plaque without stenosis. Normal left vertebral artery origin. Codominant vertebral arteries, the left is patent to the skull base without stenosis. CTA HEAD Posterior circulation: Codominant and normal distal vertebral arteries.  Normal PICA origins and vertebrobasilar junction. Patent basilar artery without stenosis. Fetal type bilateral PCA origins. Patent SCA and AICA origins. Mildly tortuous posterior communicating arteries. Bilateral PCA branches are within normal limits. Anterior circulation:  Both ICA siphons are patent. Mild calcified plaque on the left without stenosis. Normal left ophthalmic and posterior communicating artery origins, although opposite the left Pcomm origin elected directed cephalad near the left ICA terminus there is a subtle 2-3 millimeter aneurysm with a broad neck best demonstrated on series 8, image 115. See also series 7, image 91. Right ICA siphon is patent with mild ectasia and minimal plaque. Normal right posterior communicating artery origin. Patent carotid termini. Normal MCA and ACA origins. Anterior communicating artery and bilateral ACA branches are within normal limits. Left MCA M1 segment and trifurcation are patent without stenosis. Left MCA branches are within normal limits. Right MCA M1 and bifurcation are patent without stenosis. Right MCA branches are within normal limits. Venous sinuses: Patent. Anatomic variants: Fetal type bilateral PCA origins. Review of the MIP images confirms the above findings IMPRESSION: 1. Negative  for emergent large vessel occlusion or arterial stenosis in the head or neck. 2. Positive for a 3 mm Left ICA terminus aneurysm, subtle and directed cephalad opposite the left posterior communicating artery origin. 3. Chest CT today reported separately. Electronically Signed   By: Genevie Ann M.D.   On: 02/07/2019 16:59   Ct Chest Wo Contrast  Addendum Date: 02/07/2019   ADDENDUM REPORT: 02/07/2019 17:27 ADDENDUM: Not mentioned in the impression. 15 mm right thyroid nodule slightly larger compared with the prior examination of 03/25/2015. If there is further clinical concern, recommend a thyroid ultrasound. Electronically Signed   By: Kathreen Devoid   On: 02/07/2019 17:27   Result Date: 02/07/2019 CLINICAL DATA:  Altered mental status, abnormal chest x-ray EXAM: CT CHEST WITHOUT CONTRAST TECHNIQUE: Multidetector CT imaging of the chest was performed following the standard protocol without IV contrast. COMPARISON:  01/14/2019 FINDINGS: Cardiovascular: No significant vascular findings. Normal heart size. No pericardial effusion. Multi vessel coronary artery atherosclerosis. Thoracic aortic atherosclerosis. Mediastinum/Nodes: No enlarged mediastinal or axillary lymph nodes. Trachea, and esophagus demonstrate no significant findings. 15 mm right thyroid nodule. Lungs/Pleura: 8 x 4 mm left apical pulmonary nodule. Bilateral centrilobular emphysema. Small right pleural effusion. Mild right basilar atelectasis. No pneumothorax. No focal consolidation to suggest pneumonia. Upper Abdomen: Cholelithiasis. No acute upper abdominal abnormality. Musculoskeletal: No acute osseous abnormality. No aggressive osseous lesion. Syndesmophytes and ossification of the disc spaces throughout the thoracic spine as can be seen with ankylosing spondylitis. IMPRESSION: 1. Small right pleural effusion with bibasilar atelectasis. 2. Stable 9 x 4 mm left apical pulmonary nodule. Non-contrast chest CT at 6-12 months is recommended. If the nodule  is stable at time of repeat CT, then future CT at 18-24 months (from today's scan) is considered optional for low-risk patients, but is recommended for high-risk patients. This recommendation follows the consensus statement: Guidelines for Management of Incidental Pulmonary Nodules Detected on CT Images: From the Fleischner Society 2017; Radiology 2017; 284:228-243. 3. Aortic Atherosclerosis (ICD10-I70.0) and Emphysema (ICD10-J43.9). 4. Cholelithiasis. Electronically Signed: By: Kathreen Devoid On: 02/07/2019 17:02   Dg Chest Portable 1 View  Result Date: 02/07/2019 CLINICAL DATA:  Acute shortness of breath EXAM: PORTABLE CHEST 1 VIEW COMPARISON:  01/14/2019 CT, chest radiograph and prior studies FINDINGS: Patient is rotated and leaning towards the RIGHT. Increased RIGHT basilar opacity noted. The LEFT lung is clear. No pneumothorax or acute bony abnormality.  IMPRESSION: Increased RIGHT basilar opacity-favor atelectasis. Electronically Signed   By: Margarette Canada M.D.   On: 02/07/2019 14:40    EKG: Regular rate, but multiple P waves like fibrillation-type waves.  Rate-85. QRS- 78. Read by EKG device as accelerated junctional rhythm.  Assessment/Plan Active Problems:   AMS (altered mental status)   Metabolic encephalopathy- with fever of 101.5 in ED. WBC 5.5.  Normal lactic acid.  Does not meet sepsis criteria.  At this time fever etiology is not identified.  Neck is supple.  UA with rare bacteria.  Head CT negative for acute abnormality.  CT chest negative for infiltrate.  Covid test negative.  IV Zosyn started in ED.  Encephalopathy likely secondary to hypoxia 2/2 chronic respiratory failure- on 3 L of O2 at baseline, which she had refused to use. -Follow-up blood cultures -Obtain urine cultures -Follow-up respiratory virus panel. -Continue IV Zosyn at this time (will hold off on IV vancomycin considering negative recent MRSA PCR) -CBC, BMP a.m. - N/s 100cc/hr x 12 hrs  Advanced COPD with acute on  chronic respiratory failure- ABG shows acidosis, that is compensated with chronically elevated PCo2 of 72.8, pH of 7.35.  Serum Bicarbonate chronically elevated at 38.  F/w Dr. [place] Bells.  Placed on BiPAP in ED.  She is currently awake and alert. -At this time will discontinue BiPAP and continue home O2 -Respiratory protocol -Continue home nasal cannula 3 L -Cont home bronchodilators  Facial droop- reported by EMS and ED provider.  Patient off BiPAP, neuro exam without evident deficits.  Head CT, CTA head and neck negative for large vessel occlusion or acute abnormality.  Initial plan to obtain MRI to rule out CVA/TIA, which would require transfer to [facility].  Spoke to both daughters, Emily Owen declined transfer to [facility] and said even if patient had a stroke we were not going to intervene.  Patient is DNR and at this point in patient's life, both patient and daughters do not want anything aggressive done. -MRI deferred -Cont Statins  Chest pain-reported yesterday.  On re-examination patient denies pain yesterday or today.  No Difficulty breathing.  Troponin X 2 - 0.05.  EKG shows regular rate with multiple fibrillation type P waves. Repeat EKG- showing possibly junctional rhythm, Regular, and no P waves identified.  Prior EKG readings in epic are sinus.  No prior cardiac history. -EKG a.m -Trend Troponins  Hypertension-blood pressure systolic 315-176H. -Hold home Norvasc 5 mg, for now.  Diabetes-random glucose 249.  Not on antihyperglycemic agents. - SSI  Pulmonary nodules- Stable 9 x 4 mm left apical pulmonary nodule. Non-contrast chest CT at 6-12 months is recommended. If the nodule is stable at time of repeat CT, then future CT at 18-24 months (from today's scan) is considered optional for low-risk patients, but is recommended for high-risk patients.  Diet- Per chart, PCP reports patient might have had an aspiration pneumonia.  Swallow evaluation, planned but yet to be done.  -Full liquid  diet -Swallow evaluation  DVT prophylaxis: Lovenox Code Status: DNR- confirmed with both daughters. Family Communication: Both daughters- Nevin Bloodgood and Emily Owen. Disposition Plan: Per rounding team Consults called: None Admission status: Obs, Step down  Bethena Roys MD [facility]  02/07/2019, 8:41 PM

## 2019-02-07 NOTE — ED Notes (Signed)
Call to resp  Pt sats decreased to 60 per cent  Noted to be on O2 stretcher Which was out   Connected to room Oxygen at 6 L with rreturn of sats to 95 per cent  Resp called and informed

## 2019-02-07 NOTE — ED Notes (Signed)
Lab in to assess

## 2019-02-07 NOTE — ED Provider Notes (Signed)
[facility] EMERGENCY DEPARTMENT Provider Note   CSN: [id number] Arrival date & time: 02/07/19  1318    History   Chief Complaint Chief Complaint  Patient presents with  . Altered Mental Status    HPI JERIANN SAYRES is a 85 y.o. female.     Pt presents to the ED today with sob.  When EMS arrived, they noticed that pt had a left facial droop.  Pt was LSN sometime yesterday.  Pt has been admitted recently (3/25-28) for PNA.  She went from there to the [facility].  Per EMS, she was tested for Covid at the [facility] and it was negative, although we don't have those results.  However, her daughter said she was not tested.  Pt's daughter, [name], said she started acting goofy yesterday evening.  She lives with another daughter who said she was more confused than normal.  Her oxygen was off overnight for about 6 to 8 hours.  She refused to put her oxygen back in this morning.  Her daughter said she had decreased responsiveness this afternoon which is what prompted them to call EMS.   They did notice any facial droop.     Past Medical History:  Diagnosis Date  . Cancer ([place])    colon  . Cancer of cervix ([place])    cervix  . COPD (chronic obstructive pulmonary disease) ([place])   . Diabetes mellitus without complication ([place])   . Gallstones   . Gout   . Hard of hearing   . On home O2   . Osteoporosis   . Pleural effusion   . Raynaud's disease     Patient Active Problem List   Diagnosis Date Noted  . DNR (do not resuscitate) 01/27/2019  . Pressure injury of skin 01/15/2019  . Severe sepsis ([place]) 01/15/2019  . RLL pneumonia ([place]) 01/15/2019  . Generalized weakness 01/15/2019  . Left upper lobe pulmonary nodule 01/15/2019  . Raynaud's disease   . Pancreatitis 02/13/2018  . Routine general medical examination at a health care facility 07/19/2017  . Gout 03/14/2016  . Atherosclerosis of aorta ([place]) 06/11/2015  . Diabetes mellitus with peripheral vascular disease ([place])   .  Chronic respiratory failure with hypercapnia ([place]) 06/02/2015  . COPD, severe ([place]) 03/25/2015  . Pleural effusion on right 03/25/2015  . COPD with acute exacerbation ([place]) 08/11/2014  . HTN (hypertension) 08/11/2014  . Acute on chronic respiratory failure with hypoxia ([place]) 08/22/2012  . Hyperlipidemia 08/20/2012    Past Surgical History:  Procedure Laterality Date  . ABDOMINAL HYSTERECTOMY     for cervical cancer 1965  . COLON SURGERY     hemicolectomy 1998  . gallstone surgery     ERCP for gallstone pancreatitis  . ORTHOPEDIC SURGERY       OB History    Gravida  6   Para  6   Term  6   Preterm      AB      Living  5     SAB      TAB      Ectopic      Multiple      Live Births               Home Medications    Prior to Admission medications   Medication Sig Start Date End Date Taking? Authorizing Provider  alendronate (FOSAMAX) 70 MG tablet TAKE 1 TABLET BY MOUTH EVERY 7 DAYS. TAKE WITH A FULL GLASS OF WATER ON  AN EMPTY STOMACH. 11/13/18  Yes [name], MD  amLODipine (NORVASC) 5 MG tablet Take 1 tablet (5 mg total) by mouth daily. 03/05/18  Yes [name], MD  ANORO ELLIPTA 62.5-25 MCG/INH AEPB TAKE 1 PUFF BY MOUTH EVERY DAY 12/09/18  Yes Juanito Doom, MD  Balsam [place]-Castor Oil [facility]) OINT Apply to bilateral buttocks and sacrum qshift and prn for shear to buttocks and prevention. Every shift   Yes [provider]  gabapentin (NEURONTIN) 100 MG capsule Take 200 mg by mouth at bedtime.   Yes [provider]  Ipratropium-Albuterol (COMBIVENT RESPIMAT) 20-100 MCG/ACT AERS respimat Inhale 1 puff into the lungs every 4 (four) hours as needed for wheezing.   Yes [provider]  OXYGEN Inhale 3 L into the lungs continuous. [employer]   Yes [provider]  Respiratory Therapy Supplies (FLUTTER) DEVI Use as directed. 10/01/18  Yes Parrett, Tammy S, NP  rosuvastatin (CRESTOR) 20 MG tablet Take 1 tablet (20  mg total) by mouth daily. 02/27/18  Yes [name], MD  traMADol (ULTRAM) 50 MG tablet Take 1 tablet (50 mg total) by mouth every 8 (eight) hours as needed for up to 5 days. 02/06/19 02/11/19 Yes [name], MD  colchicine 0.6 MG tablet Take 0.6 mg by mouth 2 (two) times daily as needed. 01/17/19   [provider]  potassium chloride SA (K-DUR,KLOR-CON) 20 MEQ tablet Take 1 tablet (20 mEq total) by mouth daily for 5 days. 01/27/19 02/01/19  [name], MD    Family History Family History  Problem Relation Age of Onset  . Diabetes Sister   . Diabetes Sister   . Diabetes Sister   . Hypertension Mother   . Heart disease Mother   . Diabetes Sister     Social History Social History   Tobacco Use  . Smoking status: Former Smoker    Packs/day: 2.00    Years: 35.00    Pack years: 70.00    Types: Cigarettes    Last attempt to quit: 08/11/1994    Years since quitting: 24.5  . Smokeless tobacco: Never Used  Substance Use Topics  . Alcohol use: No    Alcohol/week: 0.0 standard drinks  . Drug use: No     Allergies   Patient has no known allergies.   Review of Systems Review of Systems  Respiratory: Positive for cough and shortness of breath.   Neurological:       Left facial droop     Physical Exam Updated Vital Signs BP (!) 111/59   Pulse 82   Temp 98.7 F (37.1 C) (Oral)   Resp 15   Ht 5\' 4"  (1.626 m)   Wt 82 kg   SpO2 100%   BMI 31.03 kg/m   Physical Exam Vitals signs and nursing note reviewed.  Constitutional:      Appearance: Normal appearance.  HENT:     Head: Normocephalic and atraumatic.     Right Ear: External ear normal.     Left Ear: External ear normal.     Nose: Nose normal.     Mouth/Throat:     Mouth: Mucous membranes are moist.  Eyes:     Extraocular Movements: Extraocular movements intact.     Pupils: Pupils are equal, round, and reactive to light.  Neck:     Musculoskeletal: Normal range of motion.   Cardiovascular:     Rate and Rhythm: Normal rate and regular rhythm.  Pulmonary:  Effort: Pulmonary effort is normal.     Breath sounds: Normal breath sounds.  Abdominal:     General: Abdomen is flat.  Musculoskeletal: Normal range of motion.  Skin:    General: Skin is warm.     Capillary Refill: Capillary refill takes less than 2 seconds.  Neurological:     Mental Status: She is alert.     Comments: Left facial droop  She is oriented only to person.  Psychiatric:        Mood and Affect: Mood normal.        Behavior: Behavior normal.      ED Treatments / Results  Labs (all labs ordered are listed, but only abnormal results are displayed) Labs Reviewed  APTT - Abnormal; Notable for the following components:      Result Value   aPTT 39 (*)    All other components within normal limits  CBC - Abnormal; Notable for the following components:   RBC 3.32 (*)    Hemoglobin 9.2 (*)    HCT 30.7 (*)    All other components within normal limits  COMPREHENSIVE METABOLIC PANEL - Abnormal; Notable for the following components:   Chloride 92 (*)    CO2 38 (*)    Glucose, Bld 249 (*)    BUN 27 (*)    Calcium 8.7 (*)    Total Protein 6.0 (*)    Albumin 3.0 (*)    GFR calc non Af Amer 54 (*)    All other components within normal limits  URINALYSIS, ROUTINE W REFLEX MICROSCOPIC - Abnormal; Notable for the following components:   APPearance CLOUDY (*)    Glucose, UA 50 (*)    Protein, ur 30 (*)    Bacteria, UA RARE (*)    All other components within normal limits  TROPONIN I - Abnormal; Notable for the following components:   Troponin I 0.05 (*)    All other components within normal limits  BLOOD GAS, ARTERIAL - Abnormal; Notable for the following components:   pCO2 arterial 72.8 (*)    pO2, Arterial 196 (*)    Bicarbonate 36.1 (*)    Acid-Base Excess 13.6 (*)    All other components within normal limits  I-STAT CREATININE, ED - Abnormal; Notable for the following  components:   Creatinine, Ser 1.10 (*)    All other components within normal limits  CULTURE, BLOOD (ROUTINE X 2)  CULTURE, BLOOD (ROUTINE X 2)  SARS CORONAVIRUS 2 (HOSPITAL ORDER, [place] [facility])  RESPIRATORY PANEL BY PCR  ETHANOL  PROTIME-INR  DIFFERENTIAL  RAPID URINE DRUG SCREEN, HOSP PERFORMED  LACTIC ACID, PLASMA  LACTIC ACID, PLASMA  TROPONIN I    EKG EKG Interpretation  Date/Time:  Saturday February 07 2019 14:19:54 EDT Ventricular Rate:  85 PR Interval:    QRS Duration: 78 QT Interval:  390 QTC Calculation: 464 R Axis:   40 Text Interpretation:  Accelerated Junctional rhythm ST & T wave abnormality, consider inferolateral ischemia Abnormal ECG No significant change since last tracing Confirmed by Isla Pence [phone]) on 02/07/2019 2:30:10 PM   Radiology Ct Angio Head W Or Wo Contrast  Result Date: 02/07/2019 CLINICAL DATA:  85 year old female with altered mental status, confusion. EXAM: CT ANGIOGRAPHY HEAD AND NECK TECHNIQUE: Multidetector CT imaging of the head and neck was performed using the standard protocol during bolus administration of intravenous contrast. Multiplanar CT image reconstructions and MIPs were obtained to evaluate the vascular anatomy. Carotid stenosis measurements (  when applicable) are obtained utilizing NASCET criteria, using the distal internal carotid diameter as the denominator. CONTRAST:  12mL OMNIPAQUE IOHEXOL 350 MG/ML SOLN COMPARISON:  Head CT without contrast 1329 hours today. Chest CTA today reported separately. CT head and cervical spine 05/31/2015. FINDINGS: CTA NECK Skeleton: Absent dentition. Osteopenia. No acute osseous abnormality identified. Upper chest: Reported separately today. Other neck: Mixed density 15 millimeter right thyroid nodule, stable to minimally increased since 2016 and significance doubtful. Chronic asymmetric density along the posterior right vocal fold is stable since 2016 and probably  postoperative in nature. Otherwise negative. Aortic arch: 3 vessel arch configuration. Mild for age Calcified aortic atherosclerosis. Right carotid system: Mild plaque at the brachiocephalic artery and right CCA origin without stenosis. Mildly tortuous right CCA. Minimal plaque at the right carotid bifurcation. Mild tortuosity of the cervical right ICA without stenosis. Left carotid system: Mild plaque at the left CCA origin without stenosis. Minimal to mild plaque at the left carotid bifurcation without stenosis. Retropharyngeal course of the left ICA with mild tortuosity just below the skull base. Vertebral arteries: Mild proximal right subclavian artery plaque with no stenosis there or at the right vertebral artery origin. Patent and negative right vertebral artery to the skull base. Proximal left subclavian artery plaque without stenosis. Normal left vertebral artery origin. Codominant vertebral arteries, the left is patent to the skull base without stenosis. CTA HEAD Posterior circulation: Codominant and normal distal vertebral arteries. Normal PICA origins and vertebrobasilar junction. Patent basilar artery without stenosis. Fetal type bilateral PCA origins. Patent SCA and AICA origins. Mildly tortuous posterior communicating arteries. Bilateral PCA branches are within normal limits. Anterior circulation:  Both ICA siphons are patent. Mild calcified plaque on the left without stenosis. Normal left ophthalmic and posterior communicating artery origins, although opposite the left Pcomm origin elected directed cephalad near the left ICA terminus there is a subtle 2-3 millimeter aneurysm with a broad neck best demonstrated on series 8, image 115. See also series 7, image 91. Right ICA siphon is patent with mild ectasia and minimal plaque. Normal right posterior communicating artery origin. Patent carotid termini. Normal MCA and ACA origins. Anterior communicating artery and bilateral ACA branches are within normal  limits. Left MCA M1 segment and trifurcation are patent without stenosis. Left MCA branches are within normal limits. Right MCA M1 and bifurcation are patent without stenosis. Right MCA branches are within normal limits. Venous sinuses: Patent. Anatomic variants: Fetal type bilateral PCA origins. Review of the MIP images confirms the above findings IMPRESSION: 1. Negative for emergent large vessel occlusion or arterial stenosis in the head or neck. 2. Positive for a 3 mm Left ICA terminus aneurysm, subtle and directed cephalad opposite the left posterior communicating artery origin. 3. Chest CT today reported separately. Electronically Signed   By: Genevie Ann M.D.   On: 02/07/2019 16:59   Ct Head Wo Contrast  Result Date: 02/07/2019 CLINICAL DATA:  Altered mental status EXAM: CT HEAD WITHOUT CONTRAST TECHNIQUE: Contiguous axial images were obtained from the base of the skull through the vertex without intravenous contrast. COMPARISON:  11/08/2018 FINDINGS: Brain: No evidence of acute infarction, hemorrhage, hydrocephalus, extra-axial collection or mass lesion/mass effect. Periventricular white matter low attenuation as can be seen with microvascular disease. Mild generalized cerebral atrophy. Vascular: No hyperdense vessel or unexpected calcification. Skull: No osseous abnormality. Sinuses/Orbits: Visualized paranasal sinuses are clear. Visualized mastoid sinuses are clear. Visualized orbits demonstrate no focal abnormality. Other: None IMPRESSION: No acute intracranial pathology. Electronically Signed  By: Kathreen Devoid   On: 02/07/2019 13:49   Ct Angio Neck W And/or Wo Contrast  Result Date: 02/07/2019 CLINICAL DATA:  85 year old female with altered mental status, confusion. EXAM: CT ANGIOGRAPHY HEAD AND NECK TECHNIQUE: Multidetector CT imaging of the head and neck was performed using the standard protocol during bolus administration of intravenous contrast. Multiplanar CT image reconstructions and MIPs were  obtained to evaluate the vascular anatomy. Carotid stenosis measurements (when applicable) are obtained utilizing NASCET criteria, using the distal internal carotid diameter as the denominator. CONTRAST:  5mL OMNIPAQUE IOHEXOL 350 MG/ML SOLN COMPARISON:  Head CT without contrast 1329 hours today. Chest CTA today reported separately. CT head and cervical spine 05/31/2015. FINDINGS: CTA NECK Skeleton: Absent dentition. Osteopenia. No acute osseous abnormality identified. Upper chest: Reported separately today. Other neck: Mixed density 15 millimeter right thyroid nodule, stable to minimally increased since 2016 and significance doubtful. Chronic asymmetric density along the posterior right vocal fold is stable since 2016 and probably postoperative in nature. Otherwise negative. Aortic arch: 3 vessel arch configuration. Mild for age Calcified aortic atherosclerosis. Right carotid system: Mild plaque at the brachiocephalic artery and right CCA origin without stenosis. Mildly tortuous right CCA. Minimal plaque at the right carotid bifurcation. Mild tortuosity of the cervical right ICA without stenosis. Left carotid system: Mild plaque at the left CCA origin without stenosis. Minimal to mild plaque at the left carotid bifurcation without stenosis. Retropharyngeal course of the left ICA with mild tortuosity just below the skull base. Vertebral arteries: Mild proximal right subclavian artery plaque with no stenosis there or at the right vertebral artery origin. Patent and negative right vertebral artery to the skull base. Proximal left subclavian artery plaque without stenosis. Normal left vertebral artery origin. Codominant vertebral arteries, the left is patent to the skull base without stenosis. CTA HEAD Posterior circulation: Codominant and normal distal vertebral arteries. Normal PICA origins and vertebrobasilar junction. Patent basilar artery without stenosis. Fetal type bilateral PCA origins. Patent SCA and AICA  origins. Mildly tortuous posterior communicating arteries. Bilateral PCA branches are within normal limits. Anterior circulation:  Both ICA siphons are patent. Mild calcified plaque on the left without stenosis. Normal left ophthalmic and posterior communicating artery origins, although opposite the left Pcomm origin elected directed cephalad near the left ICA terminus there is a subtle 2-3 millimeter aneurysm with a broad neck best demonstrated on series 8, image 115. See also series 7, image 91. Right ICA siphon is patent with mild ectasia and minimal plaque. Normal right posterior communicating artery origin. Patent carotid termini. Normal MCA and ACA origins. Anterior communicating artery and bilateral ACA branches are within normal limits. Left MCA M1 segment and trifurcation are patent without stenosis. Left MCA branches are within normal limits. Right MCA M1 and bifurcation are patent without stenosis. Right MCA branches are within normal limits. Venous sinuses: Patent. Anatomic variants: Fetal type bilateral PCA origins. Review of the MIP images confirms the above findings IMPRESSION: 1. Negative for emergent large vessel occlusion or arterial stenosis in the head or neck. 2. Positive for a 3 mm Left ICA terminus aneurysm, subtle and directed cephalad opposite the left posterior communicating artery origin. 3. Chest CT today reported separately. Electronically Signed   By: Genevie Ann M.D.   On: 02/07/2019 16:59   Ct Chest Wo Contrast  Addendum Date: 02/07/2019   ADDENDUM REPORT: 02/07/2019 17:27 ADDENDUM: Not mentioned in the impression. 15 mm right thyroid nodule slightly larger compared with the prior examination of 03/25/2015.  If there is further clinical concern, recommend a thyroid ultrasound. Electronically Signed   By: Kathreen Devoid   On: 02/07/2019 17:27   Result Date: 02/07/2019 CLINICAL DATA:  Altered mental status, abnormal chest x-ray EXAM: CT CHEST WITHOUT CONTRAST TECHNIQUE: Multidetector CT  imaging of the chest was performed following the standard protocol without IV contrast. COMPARISON:  01/14/2019 FINDINGS: Cardiovascular: No significant vascular findings. Normal heart size. No pericardial effusion. Multi vessel coronary artery atherosclerosis. Thoracic aortic atherosclerosis. Mediastinum/Nodes: No enlarged mediastinal or axillary lymph nodes. Trachea, and esophagus demonstrate no significant findings. 15 mm right thyroid nodule. Lungs/Pleura: 8 x 4 mm left apical pulmonary nodule. Bilateral centrilobular emphysema. Small right pleural effusion. Mild right basilar atelectasis. No pneumothorax. No focal consolidation to suggest pneumonia. Upper Abdomen: Cholelithiasis. No acute upper abdominal abnormality. Musculoskeletal: No acute osseous abnormality. No aggressive osseous lesion. Syndesmophytes and ossification of the disc spaces throughout the thoracic spine as can be seen with ankylosing spondylitis. IMPRESSION: 1. Small right pleural effusion with bibasilar atelectasis. 2. Stable 9 x 4 mm left apical pulmonary nodule. Non-contrast chest CT at 6-12 months is recommended. If the nodule is stable at time of repeat CT, then future CT at 18-24 months (from today's scan) is considered optional for low-risk patients, but is recommended for high-risk patients. This recommendation follows the consensus statement: Guidelines for Management of Incidental Pulmonary Nodules Detected on CT Images: From the Fleischner Society 2017; Radiology 2017; 284:228-243. 3. Aortic Atherosclerosis (ICD10-I70.0) and Emphysema (ICD10-J43.9). 4. Cholelithiasis. Electronically Signed: By: Kathreen Devoid On: 02/07/2019 17:02   Dg Chest Portable 1 View  Result Date: 02/07/2019 CLINICAL DATA:  Acute shortness of breath EXAM: PORTABLE CHEST 1 VIEW COMPARISON:  01/14/2019 CT, chest radiograph and prior studies FINDINGS: Patient is rotated and leaning towards the RIGHT. Increased RIGHT basilar opacity noted. The LEFT lung is  clear. No pneumothorax or acute bony abnormality. IMPRESSION: Increased RIGHT basilar opacity-favor atelectasis. Electronically Signed   By: Margarette Canada M.D.   On: 02/07/2019 14:40    Procedures Procedures (including critical care time)  Medications Ordered in ED Medications  acetaminophen (TYLENOL) 650 MG suppository (has no administration in time range)  acetaminophen (TYLENOL) suppository 975 mg (650 mg Rectal Given 02/07/19 1422)  acetaminophen (TYLENOL) 325 MG suppository (325 mg  Given 02/07/19 1422)  sodium chloride 0.9 % bolus 1,000 mL (1,000 mLs Intravenous New Bag/Given 02/07/19 1533)  piperacillin-tazobactam (ZOSYN) IVPB 3.375 g (0 g Intravenous Stopped 02/07/19 1612)  iohexol (OMNIPAQUE) 350 MG/ML injection 75 mL (75 mLs Intravenous Contrast Given 02/07/19 1557)     Initial Impression / Assessment and Plan / ED Course  I have reviewed the triage vital signs and the nursing notes.  Pertinent labs & imaging results that were available during my care of the patient were reviewed by me and considered in my medical decision making (see chart for details).   Pt was LSN last night, so a code stroke was not called.  CT head neg.  CTA head and neck shows no large vessel occlusion.  MRI not available currently.  The pt's Covid test was negative.  She has a fever which was treated with tylenol.  She was given IVFs.  CO2 was elevated, so she was put on bipap which has helped.  Lactic acid was negative.  Vitals have been stable, so doubt sepsis.  Troponin is slightly elevated.  Will need trending.  Source of fever is unclear, RVP also sent which is pending.  Cultures pending.  Pt's daughter  updated and questions answered.  Pt d/w Dr. Denton Brick (triad) for admission.  CRITICAL CARE Performed by: Isla Pence   Total critical care time: 30 minutes  Critical care time was exclusive of separately billable procedures and treating other patients.  Critical care was necessary to treat or  prevent imminent or life-threatening deterioration.  Critical care was time spent personally by me on the following activities: development of treatment plan with patient and/or surrogate as well as nursing, discussions with consultants, evaluation of patient's response to treatment, examination of patient, obtaining history from patient or surrogate, ordering and performing treatments and interventions, ordering and review of laboratory studies, ordering and review of radiographic studies, pulse oximetry and re-evaluation of patient's condition.    AZARA GEMME was evaluated in Emergency Department on 02/07/2019 for the symptoms described in the history of present illness. She was evaluated in the context of the global COVID-19 pandemic, which necessitated consideration that the patient might be at risk for infection with the SARS-CoV-2 virus that causes COVID-19. Institutional protocols and algorithms that pertain to the evaluation of patients at risk for COVID-19 are in a state of rapid change based on information released by regulatory bodies including the CDC and federal and state organizations. These policies and algorithms were followed during the patient's care in the ED.  Final Clinical Impressions(s) / ED Diagnoses   Final diagnoses:  Chronic obstructive pulmonary disease with acute exacerbation ([place])  Acute on chronic respiratory failure with hypoxia and hypercapnia ([facility])  Fever, unspecified fever cause    ED Discharge Orders    None       Isla Pence, MD 02/07/19 1733

## 2019-02-07 NOTE — ED Notes (Signed)
Date and time results received: 02/07/19 1500 (use smartphrase ".now" to insert current time)  Test: Troponin Critical Value: 0.05  Name of Provider Notified: [place]  Orders Received? Or Actions Taken?: Orders Received - See Orders for details

## 2019-02-07 NOTE — ED Notes (Signed)
Call to floor   Will call after shift change for pt to be transported

## 2019-02-07 NOTE — ED Notes (Signed)
From CT 

## 2019-02-07 NOTE — ED Notes (Signed)
O2 dropped to 3L [place] with sats maintaining in high 90s   [facility] informed

## 2019-02-07 NOTE — ED Notes (Signed)
Call to resp for B-pap upon arrival from CT

## 2019-02-07 NOTE — ED Notes (Signed)
Report to James, RN.

## 2019-02-07 NOTE — ED Notes (Addendum)
Rectal acetamenophen given in 2 rectal suppositories in 325 mg and 650 mg

## 2019-02-07 NOTE — ED Notes (Signed)
Pt is resting, appears to be breathing easier-   She is awakened for Neuro check  Lab is called re: repeat trop

## 2019-02-07 NOTE — ED Notes (Signed)
Pt appears more alert

## 2019-02-07 NOTE — ED Notes (Signed)
To CT via stretcher

## 2019-02-07 NOTE — ED Triage Notes (Signed)
Pt brought in by ems for altered mental status.  Spoke with pt's daughter, [name], who states pt was last seen normal yesterday afternoon. States she began to get confused yesterday around bedtime and did not wear her oxygen at all last night.  This morning her sister called and told [name] to get over to the house as Emily Owen would not keep oxygen on, was trying to get up, and would not listen to her.  [name] found her lying across her recliner very confused.

## 2019-02-07 NOTE — ED Notes (Signed)
Pt's daughter, [name], can be reached at [phone].

## 2019-02-07 NOTE — ED Notes (Signed)
Troubles with the EKG machine, attempting portable now

## 2019-02-08 DIAGNOSIS — J441 Chronic obstructive pulmonary disease with (acute) exacerbation: Secondary | ICD-10-CM

## 2019-02-08 DIAGNOSIS — R4182 Altered mental status, unspecified: Secondary | ICD-10-CM

## 2019-02-08 DIAGNOSIS — R509 Fever, unspecified: Secondary | ICD-10-CM

## 2019-02-08 DIAGNOSIS — J9621 Acute and chronic respiratory failure with hypoxia: Secondary | ICD-10-CM

## 2019-02-08 DIAGNOSIS — J9622 Acute and chronic respiratory failure with hypercapnia: Secondary | ICD-10-CM

## 2019-02-08 LAB — CBC
HCT: 32.4 % — ABNORMAL LOW (ref 36.0–46.0)
Hemoglobin: 9.7 g/dL — ABNORMAL LOW (ref 12.0–15.0)
MCH: 27.8 pg (ref 26.0–34.0)
MCHC: 29.9 g/dL — ABNORMAL LOW (ref 30.0–36.0)
MCV: 92.8 fL (ref 80.0–100.0)
Platelets: 177 10*3/uL (ref 150–400)
RBC: 3.49 MIL/uL — ABNORMAL LOW (ref 3.87–5.11)
RDW: 14.6 % (ref 11.5–15.5)
WBC: 4.8 10*3/uL (ref 4.0–10.5)
nRBC: 0 % (ref 0.0–0.2)

## 2019-02-08 LAB — GLUCOSE, CAPILLARY
Glucose-Capillary: 136 mg/dL — ABNORMAL HIGH (ref 70–99)
Glucose-Capillary: 180 mg/dL — ABNORMAL HIGH (ref 70–99)
Glucose-Capillary: 156 mg/dL — ABNORMAL HIGH (ref 70–99)
Glucose-Capillary: 132 mg/dL — ABNORMAL HIGH (ref 70–99)

## 2019-02-08 LAB — RESPIRATORY PANEL BY PCR

## 2019-02-08 LAB — BASIC METABOLIC PANEL
Anion gap: 9 (ref 5–15)
BUN: 22 mg/dL (ref 8–23)
CO2: 37 mmol/L — ABNORMAL HIGH (ref 22–32)
Calcium: 8.3 mg/dL — ABNORMAL LOW (ref 8.9–10.3)
Chloride: 96 mmol/L — ABNORMAL LOW (ref 98–111)
Creatinine, Ser: 0.97 mg/dL (ref 0.44–1.00)
GFR calc Af Amer: >60 mL/min (ref >60)
GFR calc non Af Amer: 53 mL/min — ABNORMAL LOW (ref >60)
Glucose, Bld: 142 mg/dL — ABNORMAL HIGH (ref 70–99)
Potassium: 4.7 mmol/L (ref 3.5–5.1)
Sodium: 142 mmol/L (ref 135–145)

## 2019-02-08 LAB — MRSA PCR SCREENING: MRSA by PCR: NEGATIVE

## 2019-02-08 MED ORDER — CHLORHEXIDINE GLUCONATE CLOTH 2 % EX PADS
6.0000 | MEDICATED_PAD | Freq: Every day | CUTANEOUS | Status: DC
  Administered 2019-02-09: 6 via TOPICAL

## 2019-02-08 MED ORDER — SODIUM CHLORIDE 0.9 % IV SOLN
INTRAVENOUS | Status: DC | PRN
  Administered 2019-02-08: 13:00:00 via INTRAVENOUS

## 2019-02-08 MED ORDER — AMOXICILLIN-POT CLAVULANATE 875-125 MG PO TABS
1.0000 | ORAL_TABLET | Freq: Two times a day (BID) | ORAL | Status: DC
  Administered 2019-02-08 – 2019-02-10 (×4): 1 via ORAL
  Filled 2019-02-08 (×4): qty 1

## 2019-02-08 NOTE — Progress Notes (Signed)
PROGRESS NOTE    Emily Owen  XBM:[id number] DOB: Jun 03, 1930 DOA: 02/07/2019 PCP: [name], MD     Brief Narrative:  85 y.o. female with medical history significant for acute on chronic respiratory failure with hypoxia chronic 3 L O2 at home, COPD, hypertension, DM, who was brought to the ED via EMS with reports of change in mental status. Per patient's daughter [name], last night patient was refusing to wear her home O2 refused to wear it for at least 7 hours..  She was also complaining of some chest pain but refused to come to the ED.  This morning patient was hanging of her recliner, and would not sit up straight.  Daughter was finally able to put O2 on patient later today.  Sats were not checked while patient was off O2. With decrease in patient's responsiveness, EMS was called. Also reported by EMS and ED provider was that patient had a facial droop.   Assessment & Plan: 1-metabolic encephalopathy -Most likely associated with low oxygen saturation while in the waiting supplementation home -Patient was also noted to be febrile; and there is on bronchitic changes appreciated on CT chest and chest x-ray without any frank infiltrates. -Patient was started on Zosyn -Cultures remain without any growth -After gentle fluid resuscitation, IV antibiotics and supportive care patient has no longer experienced any signs of active infection and is essentially back to her baseline. -COVID-19 check and negative.  2-acute on chronic respiratory failure with hypoxia -Patient ended requiring BiPAP on admission -She is supposed to be chronically on 3 L nasal cannula; ended experiencing most likely multiple hours without any oxygen supplementation prior to being brought into the emergency department -She is currently no wheezing -Off BiPAP for almost 24 hours -No respiratory distress -Will continue home bronchodilator regimen -ABG and elevated bicarb on basic metabolic panel suggesting  chronic CO2 retention and compensated respiratory acidosis.  3-facial droop -Appreciated by EMS and ED provider -CT without contrast negative for acute intracranial abnormalities -CT angiogram head and neck negative for LVO or acute stenosis -Patient neurologic exam is unremarkable -Mentation back to baseline -MRI was appropriately deflated at this time -Continue statins for secondary prevention.  4-hyperlipidemia -Continue statins  5-chest pain -Patient denies chest pain and at this moment feeling that her breathing is back to baseline -EKG without acute ischemic changes and troponin with flat elevation (which appears to be chronically in this range based on records). -Doubt acute ischemic process -Telemetry will be discontinued.  6-essential hypertension -Soft blood pressure on presentation -Continue holding Norvasc for now -Follow-up vital signs and adjust antihypertensive regimen as needed.  7-type 2 diabetes mellitus -Blood sugar on admission 249 -Patient was not using any hypoglycemic regimen prior to admission -Check A1c -Continue sliding scale insulin.  8-pulmonary nodules -Stable 9 x 4 mm left apical pulmonary nodule.  Noncontrast chest CT 6-73-month is recommended.    DVT prophylaxis: Lovenox Code Status: DNR Family Communication: Daughter Nevin Bloodgood over the phone. Disposition Plan: Transition antibiotics to oral regimen, increase physical activity; transfer out of stepdown to MedSurg bed and follow speech therapy recommendation.  Consultants:   None  Procedures:   See below for x-ray reports.  Antimicrobials:  Anti-infectives (From admission, onward)   Start     Dose/Rate Route Frequency Ordered Stop   02/07/19 2200  piperacillin-tazobactam (ZOSYN) IVPB 3.375 g     3.375 g 12.5 mL/hr over 240 Minutes Intravenous Every 8 hours 02/07/19 2054     02/07/19 1515  piperacillin-tazobactam (  ZOSYN) IVPB 3.375 g     3.375 g 100 mL/hr over 30 Minutes Intravenous   Once 02/07/19 1509 02/07/19 1612     Subjective: Afebrile, denies chest pain, no nausea, no vomiting.  Patient expressed breathing easier and feeling back to her baseline.  Denies difficulty swallowing at this moment.  Objective: Vitals:   02/08/19 1215 02/08/19 1300 02/08/19 1400 02/08/19 1600  BP: (!) 118/54 (!) 117/56 (!) 114/55 107/87  Pulse: 80 83 76 79  Resp: 17 16 15 20   Temp:      TempSrc:      SpO2: 99% 99% 99% 98%  Weight:      Height:        Intake/Output Summary (Last 24 hours) at 02/08/2019 1613 Last data filed at 02/08/2019 1400 Gross per 24 hour  Intake 1062.11 ml  Output 1000 ml  Net 62.11 ml   Filed Weights   02/07/19 1326 02/07/19 1953 02/08/19 0500  Weight: 82 kg 79.6 kg 79.6 kg    Examination: General exam: Alert, awake, oriented x 3; answering questions appropriately and in no distress.  Reports breathing back to baseline.  No choking episodes or difficulty swallowing.  She is hungry might have something to eat. Respiratory system: No wheezing, positive scattered rhonchi. Respiratory effort normal.  Wearing chronic 3 L nasal cannula supplementation. Cardiovascular system:RRR. No murmurs, rubs, gallops. Gastrointestinal system: Abdomen is obese, nondistended, soft and nontender. No organomegaly or masses felt. Normal bowel sounds heard. Central nervous system: Alert and oriented. No focal neurological deficits. Extremities: No cyanosis, clubbing or edema on exam. Skin: No rashes, lesions or ulcers appreciated on exam. Psychiatry: Judgement and insight appear normal. Mood & affect appropriate.    Data Reviewed: I have personally reviewed following labs and imaging studies  CBC: Recent Labs  Lab 02/07/19 1348 02/08/19 0439  WBC 5.5 4.8  NEUTROABS 3.6  --   HGB 9.2* 9.7*  HCT 30.7* 32.4*  MCV 92.5 92.8  PLT 188 643   Basic Metabolic Panel: Recent Labs  Lab 02/07/19 1348 02/07/19 1414 02/08/19 0439  NA 138  --  142  K 4.9  --  4.7  CL 92*   --  96*  CO2 38*  --  37*  GLUCOSE 249*  --  142*  BUN 27*  --  22  CREATININE 0.96 1.10* 0.97  CALCIUM 8.7*  --  8.3*   GFR: Estimated Creatinine Clearance: 42.5 mL/min (by C-G formula based on SCr of 0.97 mg/dL).   Liver Function Tests: Recent Labs  Lab 02/07/19 1348  AST 20  ALT 20  ALKPHOS 71  BILITOT 0.4  PROT 6.0*  ALBUMIN 3.0*   Coagulation Profile: Recent Labs  Lab 02/07/19 1348  INR 1.1   Cardiac Enzymes: Recent Labs  Lab 02/07/19 1348 02/07/19 1813  TROPONINI 0.05* 0.05*   CBG: Recent Labs  Lab 02/07/19 2212 02/08/19 0739 02/08/19 1130  GLUCAP 127* 136* 180*   Urine analysis:    Component Value Date/Time   COLORURINE YELLOW 02/07/2019 1324   APPEARANCEUR CLOUDY (A) 02/07/2019 1324   LABSPEC 1.019 02/07/2019 1324   PHURINE 5.0 02/07/2019 1324   GLUCOSEU 50 (A) 02/07/2019 1324   HGBUR NEGATIVE 02/07/2019 1324   BILIRUBINUR NEGATIVE 02/07/2019 1324   KETONESUR NEGATIVE 02/07/2019 1324   PROTEINUR 30 (A) 02/07/2019 1324   UROBILINOGEN 0.2 07/11/2015 1149   NITRITE NEGATIVE 02/07/2019 1324   LEUKOCYTESUR NEGATIVE 02/07/2019 1324    Recent Results (from the past 240 hour(s))  Culture,  blood (routine x 2)     Status: None (Preliminary result)   Collection Time: 02/07/19  1:50 PM  Result Value Ref Range Status   Specimen Description BLOOD LEFT ARM BOTTLES DRAWN AEROBIC AND ANAEROBIC  Final   Special Requests   Final    Blood Culture results may not be optimal due to an inadequate volume of blood received in culture bottles   Culture   Final    NO GROWTH < 24 HOURS Performed at [facility], [address] [place], [place] [address]    Report Status PENDING  Incomplete  Culture, blood (routine x 2)     Status: None (Preliminary result)   Collection Time: 02/07/19  2:00 PM  Result Value Ref Range Status   Specimen Description   Final    BLOOD LEFT WRIST BOTTLES DRAWN AEROBIC AND ANAEROBIC   Special Requests   Final    Blood Culture results  may not be optimal due to an inadequate volume of blood received in culture bottles   Culture   Final    NO GROWTH < 24 HOURS Performed at [facility], [address], [place], [place] [address]    Report Status PENDING  Incomplete  SARS Coronavirus 2 [facility] order, Performed in [place] hospital lab)     Status: None   Collection Time: 02/07/19  2:15 PM  Result Value Ref Range Status   SARS Coronavirus 2 NEGATIVE NEGATIVE Final    Comment: (NOTE) If result is NEGATIVE SARS-CoV-2 target nucleic acids are NOT DETECTED. The SARS-CoV-2 RNA is generally detectable in upper and lower  respiratory specimens during the acute phase of infection. The lowest  concentration of SARS-CoV-2 viral copies this assay can detect is 250  copies / mL. A negative result does not preclude SARS-CoV-2 infection  and should not be used as the sole basis for treatment or other  patient management decisions.  A negative result may occur with  improper specimen collection / handling, submission of specimen other  than nasopharyngeal swab, presence of viral mutation(s) within the  areas targeted by this assay, and inadequate number of viral copies  (<250 copies / mL). A negative result must be combined with clinical  observations, patient history, and epidemiological information. If result is POSITIVE SARS-CoV-2 target nucleic acids are DETECTED. The SARS-CoV-2 RNA is generally detectable in upper and lower  respiratory specimens dur ing the acute phase of infection.  Positive  results are indicative of active infection with SARS-CoV-2.  Clinical  correlation with patient history and other diagnostic information is  necessary to determine patient infection status.  Positive results do  not rule out bacterial infection or co-infection with other viruses. If result is PRESUMPTIVE POSTIVE SARS-CoV-2 nucleic acids MAY BE PRESENT.   A presumptive positive result was obtained on the submitted specimen  and  confirmed on repeat testing.  While 2019 novel coronavirus  (SARS-CoV-2) nucleic acids may be present in the submitted sample  additional confirmatory testing may be necessary for epidemiological  and / or clinical management purposes  to differentiate between  SARS-CoV-2 and other Sarbecovirus currently known to infect humans.  If clinically indicated additional testing with an alternate test  methodology [phone]) is advised. The SARS-CoV-2 RNA is generally  detectable in upper and lower respiratory sp ecimens during the acute  phase of infection. The expected result is Negative. Fact Sheet for Patients:  StrictlyIdeas.no Fact Sheet for Healthcare Providers: BankingDealers.co.za This test is not yet approved or cleared by  the [employer] and has been authorized for detection and/or diagnosis of SARS-CoV-2 by FDA under an Emergency Use Authorization (EUA).  This EUA will remain in effect (meaning this test can be used) for the duration of the COVID-19 declaration under Section 564(b)(1) of the Act, 21 U.S.C. section 360bbb-3(b)(1), unless the authorization is terminated or revoked sooner. Performed at [facility], [address] [place], [place] [address]   MRSA PCR Screening     Status: None   Collection Time: 02/07/19  7:39 PM  Result Value Ref Range Status   MRSA by PCR NEGATIVE NEGATIVE Final    Comment:        The GeneXpert MRSA Assay (FDA approved for NASAL specimens only), is one component of a comprehensive MRSA colonization surveillance program. It is not intended to diagnose MRSA infection nor to guide or monitor treatment for MRSA infections. Performed at [facility], [address] [place], [place] [address]      Radiology Studies: Ct Angio Head W Or Wo Contrast  Result Date: 02/07/2019 CLINICAL DATA:  85 year old female with altered mental status, confusion. EXAM: CT ANGIOGRAPHY HEAD AND NECK TECHNIQUE:  Multidetector CT imaging of the head and neck was performed using the standard protocol during bolus administration of intravenous contrast. Multiplanar CT image reconstructions and MIPs were obtained to evaluate the vascular anatomy. Carotid stenosis measurements (when applicable) are obtained utilizing NASCET criteria, using the distal internal carotid diameter as the denominator. CONTRAST:  44mL OMNIPAQUE IOHEXOL 350 MG/ML SOLN COMPARISON:  Head CT without contrast 1329 hours today. Chest CTA today reported separately. CT head and cervical spine 05/31/2015. FINDINGS: CTA NECK Skeleton: Absent dentition. Osteopenia. No acute osseous abnormality identified. Upper chest: Reported separately today. Other neck: Mixed density 15 millimeter right thyroid nodule, stable to minimally increased since 2016 and significance doubtful. Chronic asymmetric density along the posterior right vocal fold is stable since 2016 and probably postoperative in nature. Otherwise negative. Aortic arch: 3 vessel arch configuration. Mild for age Calcified aortic atherosclerosis. Right carotid system: Mild plaque at the brachiocephalic artery and right CCA origin without stenosis. Mildly tortuous right CCA. Minimal plaque at the right carotid bifurcation. Mild tortuosity of the cervical right ICA without stenosis. Left carotid system: Mild plaque at the left CCA origin without stenosis. Minimal to mild plaque at the left carotid bifurcation without stenosis. Retropharyngeal course of the left ICA with mild tortuosity just below the skull base. Vertebral arteries: Mild proximal right subclavian artery plaque with no stenosis there or at the right vertebral artery origin. Patent and negative right vertebral artery to the skull base. Proximal left subclavian artery plaque without stenosis. Normal left vertebral artery origin. Codominant vertebral arteries, the left is patent to the skull base without stenosis. CTA HEAD Posterior circulation:  Codominant and normal distal vertebral arteries. Normal PICA origins and vertebrobasilar junction. Patent basilar artery without stenosis. Fetal type bilateral PCA origins. Patent SCA and AICA origins. Mildly tortuous posterior communicating arteries. Bilateral PCA branches are within normal limits. Anterior circulation:  Both ICA siphons are patent. Mild calcified plaque on the left without stenosis. Normal left ophthalmic and posterior communicating artery origins, although opposite the left Pcomm origin elected directed cephalad near the left ICA terminus there is a subtle 2-3 millimeter aneurysm with a broad neck best demonstrated on series 8, image 115. See also series 7, image 91. Right ICA siphon is patent with mild ectasia and minimal plaque. Normal right posterior communicating artery origin. Patent carotid termini. Normal MCA  and ACA origins. Anterior communicating artery and bilateral ACA branches are within normal limits. Left MCA M1 segment and trifurcation are patent without stenosis. Left MCA branches are within normal limits. Right MCA M1 and bifurcation are patent without stenosis. Right MCA branches are within normal limits. Venous sinuses: Patent. Anatomic variants: Fetal type bilateral PCA origins. Review of the MIP images confirms the above findings IMPRESSION: 1. Negative for emergent large vessel occlusion or arterial stenosis in the head or neck. 2. Positive for a 3 mm Left ICA terminus aneurysm, subtle and directed cephalad opposite the left posterior communicating artery origin. 3. Chest CT today reported separately. Electronically Signed   By: Genevie Ann M.D.   On: 02/07/2019 16:59   Ct Head Wo Contrast  Result Date: 02/07/2019 CLINICAL DATA:  Altered mental status EXAM: CT HEAD WITHOUT CONTRAST TECHNIQUE: Contiguous axial images were obtained from the base of the skull through the vertex without intravenous contrast. COMPARISON:  11/08/2018 FINDINGS: Brain: No evidence of acute  infarction, hemorrhage, hydrocephalus, extra-axial collection or mass lesion/mass effect. Periventricular white matter low attenuation as can be seen with microvascular disease. Mild generalized cerebral atrophy. Vascular: No hyperdense vessel or unexpected calcification. Skull: No osseous abnormality. Sinuses/Orbits: Visualized paranasal sinuses are clear. Visualized mastoid sinuses are clear. Visualized orbits demonstrate no focal abnormality. Other: None IMPRESSION: No acute intracranial pathology. Electronically Signed   By: Kathreen Devoid   On: 02/07/2019 13:49   Ct Angio Neck W And/or Wo Contrast  Result Date: 02/07/2019 CLINICAL DATA:  85 year old female with altered mental status, confusion. EXAM: CT ANGIOGRAPHY HEAD AND NECK TECHNIQUE: Multidetector CT imaging of the head and neck was performed using the standard protocol during bolus administration of intravenous contrast. Multiplanar CT image reconstructions and MIPs were obtained to evaluate the vascular anatomy. Carotid stenosis measurements (when applicable) are obtained utilizing NASCET criteria, using the distal internal carotid diameter as the denominator. CONTRAST:  52mL OMNIPAQUE IOHEXOL 350 MG/ML SOLN COMPARISON:  Head CT without contrast 1329 hours today. Chest CTA today reported separately. CT head and cervical spine 05/31/2015. FINDINGS: CTA NECK Skeleton: Absent dentition. Osteopenia. No acute osseous abnormality identified. Upper chest: Reported separately today. Other neck: Mixed density 15 millimeter right thyroid nodule, stable to minimally increased since 2016 and significance doubtful. Chronic asymmetric density along the posterior right vocal fold is stable since 2016 and probably postoperative in nature. Otherwise negative. Aortic arch: 3 vessel arch configuration. Mild for age Calcified aortic atherosclerosis. Right carotid system: Mild plaque at the brachiocephalic artery and right CCA origin without stenosis. Mildly tortuous  right CCA. Minimal plaque at the right carotid bifurcation. Mild tortuosity of the cervical right ICA without stenosis. Left carotid system: Mild plaque at the left CCA origin without stenosis. Minimal to mild plaque at the left carotid bifurcation without stenosis. Retropharyngeal course of the left ICA with mild tortuosity just below the skull base. Vertebral arteries: Mild proximal right subclavian artery plaque with no stenosis there or at the right vertebral artery origin. Patent and negative right vertebral artery to the skull base. Proximal left subclavian artery plaque without stenosis. Normal left vertebral artery origin. Codominant vertebral arteries, the left is patent to the skull base without stenosis. CTA HEAD Posterior circulation: Codominant and normal distal vertebral arteries. Normal PICA origins and vertebrobasilar junction. Patent basilar artery without stenosis. Fetal type bilateral PCA origins. Patent SCA and AICA origins. Mildly tortuous posterior communicating arteries. Bilateral PCA branches are within normal limits. Anterior circulation:  Both ICA siphons are patent.  Mild calcified plaque on the left without stenosis. Normal left ophthalmic and posterior communicating artery origins, although opposite the left Pcomm origin elected directed cephalad near the left ICA terminus there is a subtle 2-3 millimeter aneurysm with a broad neck best demonstrated on series 8, image 115. See also series 7, image 91. Right ICA siphon is patent with mild ectasia and minimal plaque. Normal right posterior communicating artery origin. Patent carotid termini. Normal MCA and ACA origins. Anterior communicating artery and bilateral ACA branches are within normal limits. Left MCA M1 segment and trifurcation are patent without stenosis. Left MCA branches are within normal limits. Right MCA M1 and bifurcation are patent without stenosis. Right MCA branches are within normal limits. Venous sinuses: Patent.  Anatomic variants: Fetal type bilateral PCA origins. Review of the MIP images confirms the above findings IMPRESSION: 1. Negative for emergent large vessel occlusion or arterial stenosis in the head or neck. 2. Positive for a 3 mm Left ICA terminus aneurysm, subtle and directed cephalad opposite the left posterior communicating artery origin. 3. Chest CT today reported separately. Electronically Signed   By: Genevie Ann M.D.   On: 02/07/2019 16:59   Ct Chest Wo Contrast  Addendum Date: 02/07/2019   ADDENDUM REPORT: 02/07/2019 17:27 ADDENDUM: Not mentioned in the impression. 15 mm right thyroid nodule slightly larger compared with the prior examination of 03/25/2015. If there is further clinical concern, recommend a thyroid ultrasound. Electronically Signed   By: Kathreen Devoid   On: 02/07/2019 17:27   Result Date: 02/07/2019 CLINICAL DATA:  Altered mental status, abnormal chest x-ray EXAM: CT CHEST WITHOUT CONTRAST TECHNIQUE: Multidetector CT imaging of the chest was performed following the standard protocol without IV contrast. COMPARISON:  01/14/2019 FINDINGS: Cardiovascular: No significant vascular findings. Normal heart size. No pericardial effusion. Multi vessel coronary artery atherosclerosis. Thoracic aortic atherosclerosis. Mediastinum/Nodes: No enlarged mediastinal or axillary lymph nodes. Trachea, and esophagus demonstrate no significant findings. 15 mm right thyroid nodule. Lungs/Pleura: 8 x 4 mm left apical pulmonary nodule. Bilateral centrilobular emphysema. Small right pleural effusion. Mild right basilar atelectasis. No pneumothorax. No focal consolidation to suggest pneumonia. Upper Abdomen: Cholelithiasis. No acute upper abdominal abnormality. Musculoskeletal: No acute osseous abnormality. No aggressive osseous lesion. Syndesmophytes and ossification of the disc spaces throughout the thoracic spine as can be seen with ankylosing spondylitis. IMPRESSION: 1. Small right pleural effusion with bibasilar  atelectasis. 2. Stable 9 x 4 mm left apical pulmonary nodule. Non-contrast chest CT at 6-12 months is recommended. If the nodule is stable at time of repeat CT, then future CT at 18-24 months (from today's scan) is considered optional for low-risk patients, but is recommended for high-risk patients. This recommendation follows the consensus statement: Guidelines for Management of Incidental Pulmonary Nodules Detected on CT Images: From the Fleischner Society 2017; Radiology 2017; 284:228-243. 3. Aortic Atherosclerosis (ICD10-I70.0) and Emphysema (ICD10-J43.9). 4. Cholelithiasis. Electronically Signed: By: Kathreen Devoid On: 02/07/2019 17:02   Dg Chest Portable 1 View  Result Date: 02/07/2019 CLINICAL DATA:  Acute shortness of breath EXAM: PORTABLE CHEST 1 VIEW COMPARISON:  01/14/2019 CT, chest radiograph and prior studies FINDINGS: Patient is rotated and leaning towards the RIGHT. Increased RIGHT basilar opacity noted. The LEFT lung is clear. No pneumothorax or acute bony abnormality. IMPRESSION: Increased RIGHT basilar opacity-favor atelectasis. Electronically Signed   By: Margarette Canada M.D.   On: 02/07/2019 14:40   Scheduled Meds: . [START ON 02/09/2019] Chlorhexidine Gluconate Cloth  6 each Topical Q0600  . enoxaparin (LOVENOX) injection  40 mg Subcutaneous Q24H  . insulin aspart  0-9 Units Subcutaneous TID WC  . rosuvastatin  20 mg Oral Daily  . umeclidinium-vilanterol  1 puff Inhalation Daily   Continuous Infusions: . sodium chloride Stopped (02/08/19 1329)  . piperacillin-tazobactam (ZOSYN)  IV 12.5 mL/hr at 02/08/19 1400     LOS: 0 days    Time spent: 30 minutes    Barton Dubois, MD [facility] Pager [phone]  02/08/2019, 4:13 PM

## 2019-02-08 NOTE — Progress Notes (Signed)
Report given to 300 RN at this time.  

## 2019-02-09 ENCOUNTER — Observation Stay (HOSPITAL_COMMUNITY): Payer: Medicare HMO

## 2019-02-09 DIAGNOSIS — J9621 Acute and chronic respiratory failure with hypoxia: Secondary | ICD-10-CM | POA: Diagnosis not present

## 2019-02-09 DIAGNOSIS — Z515 Encounter for palliative care: Secondary | ICD-10-CM

## 2019-02-09 DIAGNOSIS — R296 Repeated falls: Secondary | ICD-10-CM | POA: Diagnosis present

## 2019-02-09 DIAGNOSIS — E119 Type 2 diabetes mellitus without complications: Secondary | ICD-10-CM | POA: Diagnosis present

## 2019-02-09 DIAGNOSIS — J9622 Acute and chronic respiratory failure with hypercapnia: Secondary | ICD-10-CM

## 2019-02-09 DIAGNOSIS — H9190 Unspecified hearing loss, unspecified ear: Secondary | ICD-10-CM | POA: Diagnosis present

## 2019-02-09 DIAGNOSIS — R1310 Dysphagia, unspecified: Secondary | ICD-10-CM | POA: Diagnosis present

## 2019-02-09 DIAGNOSIS — E872 Acidosis: Secondary | ICD-10-CM | POA: Diagnosis present

## 2019-02-09 DIAGNOSIS — R911 Solitary pulmonary nodule: Secondary | ICD-10-CM | POA: Diagnosis present

## 2019-02-09 DIAGNOSIS — T17908A Unspecified foreign body in respiratory tract, part unspecified causing other injury, initial encounter: Secondary | ICD-10-CM | POA: Diagnosis present

## 2019-02-09 DIAGNOSIS — Z7189 Other specified counseling: Secondary | ICD-10-CM | POA: Diagnosis not present

## 2019-02-09 DIAGNOSIS — I7300 Raynaud's syndrome without gangrene: Secondary | ICD-10-CM | POA: Diagnosis present

## 2019-02-09 DIAGNOSIS — J90 Pleural effusion, not elsewhere classified: Secondary | ICD-10-CM | POA: Diagnosis present

## 2019-02-09 DIAGNOSIS — E041 Nontoxic single thyroid nodule: Secondary | ICD-10-CM | POA: Diagnosis present

## 2019-02-09 DIAGNOSIS — I700 Atherosclerosis of aorta: Secondary | ICD-10-CM | POA: Diagnosis present

## 2019-02-09 DIAGNOSIS — J439 Emphysema, unspecified: Secondary | ICD-10-CM | POA: Diagnosis not present

## 2019-02-09 DIAGNOSIS — T17908D Unspecified foreign body in respiratory tract, part unspecified causing other injury, subsequent encounter: Secondary | ICD-10-CM | POA: Diagnosis not present

## 2019-02-09 DIAGNOSIS — I10 Essential (primary) hypertension: Secondary | ICD-10-CM | POA: Diagnosis present

## 2019-02-09 DIAGNOSIS — J690 Pneumonitis due to inhalation of food and vomit: Secondary | ICD-10-CM | POA: Diagnosis present

## 2019-02-09 DIAGNOSIS — E669 Obesity, unspecified: Secondary | ICD-10-CM | POA: Diagnosis present

## 2019-02-09 DIAGNOSIS — Z20828 Contact with and (suspected) exposure to other viral communicable diseases: Secondary | ICD-10-CM | POA: Diagnosis present

## 2019-02-09 DIAGNOSIS — M810 Age-related osteoporosis without current pathological fracture: Secondary | ICD-10-CM | POA: Diagnosis present

## 2019-02-09 DIAGNOSIS — J441 Chronic obstructive pulmonary disease with (acute) exacerbation: Secondary | ICD-10-CM | POA: Diagnosis not present

## 2019-02-09 DIAGNOSIS — E785 Hyperlipidemia, unspecified: Secondary | ICD-10-CM | POA: Diagnosis present

## 2019-02-09 DIAGNOSIS — G9341 Metabolic encephalopathy: Secondary | ICD-10-CM | POA: Diagnosis present

## 2019-02-09 DIAGNOSIS — M109 Gout, unspecified: Secondary | ICD-10-CM | POA: Diagnosis present

## 2019-02-09 DIAGNOSIS — Z66 Do not resuscitate: Secondary | ICD-10-CM | POA: Diagnosis present

## 2019-02-09 DIAGNOSIS — Z6830 Body mass index (BMI) 30.0-30.9, adult: Secondary | ICD-10-CM | POA: Diagnosis not present

## 2019-02-09 DIAGNOSIS — J189 Pneumonia, unspecified organism: Secondary | ICD-10-CM | POA: Diagnosis not present

## 2019-02-09 DIAGNOSIS — Z833 Family history of diabetes mellitus: Secondary | ICD-10-CM | POA: Diagnosis not present

## 2019-02-09 LAB — URINE CULTURE: Culture: NO GROWTH

## 2019-02-09 LAB — GLUCOSE, CAPILLARY
Glucose-Capillary: 126 mg/dL — ABNORMAL HIGH (ref 70–99)
Glucose-Capillary: 199 mg/dL — ABNORMAL HIGH (ref 70–99)
Glucose-Capillary: 214 mg/dL — ABNORMAL HIGH (ref 70–99)
Glucose-Capillary: 115 mg/dL — ABNORMAL HIGH (ref 70–99)

## 2019-02-09 NOTE — Evaluation (Signed)
Physical Therapy Evaluation Patient Details Name: Emily Owen MRN: [id number] DOB: 18-Sep-1930 Today's Date: 02/09/2019   History of Present Illness  Emily Owen is a 85 y.o. female with medical history significant for acute on chronic respiratory failure with hypoxia chronic 3 L O2 at home, COPD, hypertension, DM, who was brought to the ED via EMS with reports of change in mental status.  Patient refused sides with her daughter [name] but has a second daughter called [name].  Per patient's daughter [name], last night patient was refusing to wear her home O2 refused to wear it for at least 7 hours..  She was also complaining of some chest pain but refused to come to the ED.  This morning patient was hanging of her recliner, and would not sit up straight.  Daughter was finally able to put O2 on patient later today.  Sats were not checked while patient was off O2. With decrease in patient's responsiveness, EMS was called.    Clinical Impression  Patient limited for functional mobility as stated below secondary to BLE weakness, fatigue and fair/poor standing balance.  Patient transferred to commode in bathroom with most difficulty sit to standing from commode, limited for ambulation due to fatigue, SpO2 at 92-93% throughout visit and patient tolerated sitting up in chair after therapy.   Patient will benefit from continued physical therapy in hospital and recommended venue below to increase strength, balance, endurance for safe ADLs and gait.     Follow Up Recommendations Home health PT;Supervision for mobility/OOB;Supervision - Intermittent    Equipment Recommendations  None recommended by PT    Recommendations for Other Services       Precautions / Restrictions Precautions Precautions: Fall Restrictions Weight Bearing Restrictions: No      Mobility  Bed Mobility Overal bed mobility: Needs Assistance Bed Mobility: Supine to Sit     Supine to sit: Min assist     General bed  mobility comments: increased time, labored movement  Transfers Overall transfer level: Needs assistance Equipment used: Rolling walker (2 wheeled) Transfers: Sit to/from [facility] Sit to Stand: Min guard;Min assist Stand pivot transfers: Min guard       General transfer comment: required Min/mod assist for sit to stand from commode in bathroom, otherwise mostly Min guard from bedside/chair  Ambulation/Gait Ambulation/Gait assistance: Min guard;Min assist Gait Distance (Feet): 30 Feet Assistive device: Rolling walker (2 wheeled) Gait Pattern/deviations: Decreased step length - right;Decreased step length - left;Decreased stride length Gait velocity: decreased   General Gait Details: slightly labored slow cadence without loss of balance, limited mostly due to c/o fatigue  Stairs            Wheelchair Mobility    Modified Rankin (Stroke Patients Only)       Balance Overall balance assessment: Needs assistance Sitting-balance support: Feet supported;No upper extremity supported Sitting balance-Leahy Scale: Good     Standing balance support: Bilateral upper extremity supported;During functional activity Standing balance-Leahy Scale: Fair Standing balance comment: using RW                             Pertinent Vitals/Pain Pain Assessment: No/denies pain    Home Living Family/patient expects to be discharged to:: Private residence Living Arrangements: Children Available Help at Discharge: Family;Available 24 hours/day Type of Home: House Home Access: Ramped entrance     Home Layout: One level Home Equipment: Walker - 2 wheels;Cane - single point;Bedside commode;Grab bars -  toilet;Walker - 4 wheels      Prior Function Level of Independence: Independent with assistive device(s)   Gait / Transfers Assistance Needed: household and short distanced community ambulator  ADL's / Homemaking Assistance Needed: assisted by daughter         Journalist, newspaper        Extremity/Trunk Assessment   Upper Extremity Assessment Upper Extremity Assessment: Generalized weakness;RUE deficits/detail;LUE deficits/detail RUE Deficits / Details: grossly -4/5 LUE Deficits / Details: grossly 3+/5    Lower Extremity Assessment Lower Extremity Assessment: Generalized weakness    Cervical / Trunk Assessment Cervical / Trunk Assessment: Normal  Communication   Communication: No difficulties  Cognition Arousal/Alertness: Awake/alert Behavior During Therapy: WFL for tasks assessed/performed Overall Cognitive Status: Within Functional Limits for tasks assessed                                        General Comments      Exercises     Assessment/Plan    PT Assessment Patient needs continued PT services  PT Problem List Decreased strength;Decreased activity tolerance;Decreased balance;Decreased mobility;Cardiopulmonary status limiting activity       PT Treatment Interventions Therapeutic exercise;Gait training;Stair training;Functional mobility training;Therapeutic activities;Patient/family education    PT Goals (Current goals can be found in the Care Plan section)  Acute Rehab PT Goals Patient Stated Goal: return home with family to assist PT Goal Formulation: With patient Time For Goal Achievement: 02/16/19 Potential to Achieve Goals: Good    Frequency Min 3X/week   Barriers to discharge        Co-evaluation               AM-PAC PT "6 Clicks" Mobility  Outcome Measure Help needed turning from your back to your side while in a flat bed without using bedrails?: None Help needed moving from lying on your back to sitting on the side of a flat bed without using bedrails?: A Little Help needed moving to and from a bed to a chair (including a wheelchair)?: A Little Help needed standing up from a chair using your arms (e.g., wheelchair or bedside chair)?: A Little Help needed to walk in hospital  room?: A Little Help needed climbing 3-5 steps with a railing? : A Little 6 Click Score: 19    End of Session   Activity Tolerance: Patient tolerated treatment well;Patient limited by fatigue Patient left: in chair;with call bell/phone within reach Nurse Communication: Mobility status PT Visit Diagnosis: Unsteadiness on feet (R26.81);Other abnormalities of gait and mobility (R26.89);Muscle weakness (generalized) (M62.81)    Time: 6812-7517 PT Time Calculation (min) (ACUTE ONLY): 32 min   Charges:   PT Evaluation $PT Eval Moderate Complexity: 1 Mod PT Treatments $Therapeutic Activity: 23-37 mins        12:54 PM, 02/09/19 Lonell Grandchild, MPT Physical Therapist with [facility] [phone] office [phone] mobile phone

## 2019-02-09 NOTE — Progress Notes (Signed)
Modified Barium Swallow Progress Note  Patient Details  Name: Emily Owen MRN: [id number] Date of Birth: [birth year]  Today's Date: 02/09/2019  Modified Barium Swallow completed.  Full report located under Chart Review in the Imaging Section.  Brief recommendations include the following:  Clinical Impression  MBSS completed. Pt presents with normal oropharyngeal swallow for age when assessed with barium tinged thin, puree, and regular textures. Pt with a single episode of flash penetration (underepiglottic coating) of thins when taking mixed consistencies (thin and barium tablet), which was not immediately removed. It was removed after subsequent swallows of foods/liquids. The barium tablet was transiently delayed in the distal esophagus, but cleared with a bite of puree. Pt's risk factors for aspiration include advanced age and reduced respiratory support (COPD with chronic O2 at home). Pt reportedly does not often wear her dentures when eating so she was educated on the needs to chop foods and/or masticate foods well prior to swallow. SLP also provided education regarding role of COPD related to swallowing and risks for aspiration given compromised swallow/respiration reciprocity. Recommend D3/mech soft and thin liquids with standard aspiration and reflux precautions. No further SLP services indicated at this time. SLP will sign off.   Swallow Evaluation Recommendations       SLP Diet Recommendations: Dysphagia 3 (Mech soft) solids;Thin liquid(due to edentulous status)   Liquid Administration via: Cup;Straw   Medication Administration: Whole meds with liquid   Supervision: Patient able to self feed;Intermittent supervision to cue for compensatory strategies   Compensations: Slow rate;Small sips/bites   Postural Changes: Remain semi-upright after after feeds/meals (Comment);Seated upright at 90 degrees   Oral Care Recommendations: Oral care BID   Other Recommendations: Clarify  dietary restrictions   Thank you,  Genene Churn, [place] 02/09/2019,4:13 PM

## 2019-02-09 NOTE — Progress Notes (Addendum)
[facility] PRO[facility]ESS NOTE  Emily Owen VZD:[id number] DOB: 05-Nov-1929 DOA: 02/07/2019 PCP: [name], MD   Narrative: 85 y/o ? Severe COPD/ 3L cont--Prior R Pleural effusion [chr.] DM ty ii Htn Undisplaced L3 fracture in the past --Probable Osteoporosis  Recent # L distal radial  12/10/2018 Prior RLL PNa 3/25-->3/28  Patient admitted 02/07/2019 with chest tightness as well as refusal to wear oxygen-she was placed on BiPAP coming into the ED and was able to answer simple questions? facial droop   Initially had a T-max of 101 low normal blood pressures bicarb high troponin slightly elevated CT head and neck negative CT chest showed small effusion and nodules  A & Plan Acute metabolic encephalopathy likely secondary to either pneumonia or acute hypoxemia Strep pneumo and Legionella and COVID were all negative Some concerns from PCP notes about aspiration-await speech therapy input, continue dysphagia 3 diet Desat screen needed Note admission 3/25-3/28 2024 right lower lobe pneumonia leading me to think this is predominantly aspiration pneumonia--- would continue Augmentin will probably need   10 days total ending on 4/27 Severe COPD on 3 L continuously with prior right pleural effusion Probably will still need 3 L on discharge Type 2 diabetes mellitus Home medications include only gabapentin it does not appear patient is on insulin Sliding scale here shows sugars 130s to 200 range Facial droop CT head and other work-up negative Patient not on an aggressive resuscitation path and is DNR Reported chest pain Troponin trend flat no further work-up Pulmonary nodules, thyroid nodule Outpatient further characterization of both needed please ensure that this is on the discharge summary Body mass index is 30.12 kg/m.    DVT Lovenox code Status: Presumed: full communication: Long discussion with family, Daughter EMILY is an Therapist, sports at [place] disposition Plan:  Inpatient   Canyon Lohr, MD  [facility] [address] [place] [email or web address] 7PM-7AM contact night coverage as above 02/09/2019, 11:39 AM  LOS: 0 days   Consultants:  None  Procedures:  n  Antimicrobials:  Currently Augmenti  Interval history/Subjective: Awake alert pleasant no distress mild cough Does not cough when she eats she says No chest pain no overt fever today per her  Objective:  Vitals:  Vitals:   02/09/19 0633 02/09/19 0720  BP: 120/78   Pulse: 90   Resp:    Temp:    SpO2: 92% 96%    Exam:  Pleasant oriented no distress EOMI NCAT Oxygen by nasal cannula Coherent No distress S1-S2 no murmur No rales no rhonchi No lower extremity edema   I have personally reviewed the following:  DATA   Labs:   no recent labs so need to repeat in a.m.  Imaging studies:  2 view chest x-ray showed no change in small right pleural effusion and bibasilar disease  MBS is pending   Scheduled Meds: . amoxicillin-clavulanate  1 tablet Oral Q12H  . Chlorhexidine Gluconate Cloth  6 each Topical Q0600  . enoxaparin (LOVENOX) injection  40 mg Subcutaneous Q24H  . insulin aspart  0-9 Units Subcutaneous TID WC  . rosuvastatin  20 mg Oral Daily  . umeclidinium-vilanterol  1 puff Inhalation Daily   Continuous Infusions: . sodium chloride Stopped (02/08/19 1736)    Active Problems:   AMS (altered mental status)   LOS: 0 days

## 2019-02-09 NOTE — Evaluation (Signed)
Clinical/Bedside Swallow Evaluation Patient Details  Name: Emily Owen MRN: [id number] Date of Birth: 04/13/1930  Today's Date: 02/09/2019 Time: SLP Start Time (ACUTE ONLY): 1400 SLP Stop Time (ACUTE ONLY): 1423 SLP Time Calculation (min) (ACUTE ONLY): 23 min  Past Medical History:  Past Medical History:  Diagnosis Date  . Cancer ([place])    colon  . Cancer of cervix ([place])    cervix  . COPD (chronic obstructive pulmonary disease) ([place])   . Diabetes mellitus without complication ([place])   . Gallstones   . Gout   . Hard of hearing   . On home O2   . Osteoporosis   . Pleural effusion   . Raynaud's disease    Past Surgical History:  Past Surgical History:  Procedure Laterality Date  . ABDOMINAL HYSTERECTOMY     for cervical cancer 1965  . COLON SURGERY     hemicolectomy 1998  . gallstone surgery     ERCP for gallstone pancreatitis  . ORTHOPEDIC SURGERY     HPI:  Emily Owen is a 85 y.o. female with medical history significant for acute on chronic respiratory failure with hypoxia chronic 3 L O2 at home, COPD, hypertension, DM, who was brought to the ED via EMS with reports of change in mental status.  Patient refused sides with her daughter [name] but has a second daughter called [name].  Per patient's daughter [name], last night patient was refusing to wear her home O2 refused to wear it for at least 7 hours..  She was also complaining of some chest pain but refused to come to the ED.  This morning patient was hanging of her recliner, and would not sit up straight.  Daughter was finally able to put O2 on patient later today.  Sats were not checked while patient was off O2. With decrease in patient's responsiveness, EMS was called. BSE requested.   Assessment / Plan / Recommendation Clinical Impression  Pt seen in room for a clinical swallow evaluation. Pt seated upright in chair. Oral motor examination is WNL in setting of edentulous status. Pt has dentures, but does not have  them presently and frequently eats without them. Elicited cough is congested and vocal quality is with reduced vocal intensity. SLP provided hand over hand assist for self feeding of ice chips, thin water via cup/straw, applesauce, and graham crackers. Pt without over signs or symptoms of reduced airway protection/aspiration at this time, however chart review reveals severe COPD and h/o PNA. Her PCP had also planned to order MBSS in early April due to concerns for aspiration. Pt is at risk for aspiration given severity of COPD and cognitive deficits. OK to continue diet as ordered (D3 and thin) and will proceed with MBSS to objectively evaluate swallow function. Will see if radiology can take [place] today.   SLP Visit Diagnosis: Dysphagia, unspecified (R13.10)    Aspiration Risk  Mild aspiration risk    Diet Recommendation Dysphagia 3 (Mech soft);Thin liquid   Liquid Administration via: Cup;Straw Medication Administration: Whole meds with liquid Supervision: Patient able to self feed;Intermittent supervision to cue for compensatory strategies Compensations: Slow rate;Small sips/bites Postural Changes: Seated upright at 90 degrees;Remain upright for at least 30 minutes after po intake    Other  Recommendations Oral Care Recommendations: Oral care BID;Staff/trained caregiver to provide oral care Other Recommendations: Clarify dietary restrictions   Follow up Recommendations None      Frequency and Duration min 2x/week  1 week  Prognosis Prognosis for Safe Diet Advancement: Fair Barriers to Reach Goals: Cognitive deficits      Swallow Study   General Date of Onset: 02/07/19 HPI: Emily BARB is a 85 y.o. female with medical history significant for acute on chronic respiratory failure with hypoxia chronic 3 L O2 at home, COPD, hypertension, DM, who was brought to the ED via EMS with reports of change in mental status.  Patient refused sides with her daughter [name] but has a second  daughter called [name].  Per patient's daughter [name], last night patient was refusing to wear her home O2 refused to wear it for at least 7 hours..  She was also complaining of some chest pain but refused to come to the ED.  This morning patient was hanging of her recliner, and would not sit up straight.  Daughter was finally able to put O2 on patient later today.  Sats were not checked while patient was off O2. With decrease in patient's responsiveness, EMS was called. BSE requested. Type of Study: Bedside Swallow Evaluation Previous Swallow Assessment: None on record Diet Prior to this Study: Dysphagia 3 (soft);Thin liquids Temperature Spikes Noted: No Respiratory Status: Nasal cannula History of Recent Intubation: No Behavior/Cognition: Alert;Cooperative;Pleasant mood Oral Cavity Assessment: Within Functional Limits Oral Care Completed by SLP: No Oral Cavity - Dentition: Edentulous;Dentures, not available Vision: Functional for self-feeding Self-Feeding Abilities: Able to feed self Patient Positioning: Upright in chair Baseline Vocal Quality: Normal;Low vocal intensity Volitional Cough: Congested Volitional Swallow: Able to elicit    Oral/Motor/Sensory Function Overall Oral Motor/Sensory Function: Within functional limits   Ice Chips Ice chips: Within functional limits Presentation: Spoon   Thin Liquid Thin Liquid: Within functional limits Presentation: Cup;Self Fed;Straw    Nectar Thick Nectar Thick Liquid: Not tested   Honey Thick Honey Thick Liquid: Not tested   Puree Puree: Within functional limits Presentation: Self Fed;Spoon   Solid     Solid: Within functional limits Presentation: Self Fed     Thank you,  Genene Churn, [place] 02/09/2019,2:49 PM

## 2019-02-09 NOTE — Plan of Care (Signed)
  Problem: Acute Rehab PT Goals(only PT should resolve) Goal: Pt Will Go Supine/Side To Sit Outcome: Progressing Flowsheets (Taken 02/09/2019 1257) Pt will go Supine/Side to Sit: with supervision Goal: Patient Will Transfer Sit To/From Stand Outcome: Progressing Flowsheets (Taken 02/09/2019 1257) Patient will transfer sit to/from stand: with supervision Goal: Pt Will Transfer Bed To Chair/Chair To Bed Outcome: Progressing Flowsheets (Taken 02/09/2019 1257) Pt will Transfer Bed to Chair/Chair to Bed: with supervision Goal: Pt Will Ambulate Outcome: Progressing Flowsheets (Taken 02/09/2019 1257) Pt will Ambulate: 50 feet; with supervision; with rolling walker   12:57 PM, 02/09/19 Lonell Grandchild, MPT Physical Therapist with [facility] 336 [phone] office [phone] mobile phone

## 2019-02-09 NOTE — Clinical Social Work Note (Signed)
Patient is active with [facility]. Vaughan Basta with [facility] made aware that patient will need PT and ST at discharge. She is currently active with a RN only.      Abhay Godbolt, Clydene Pugh, LCSW

## 2019-02-09 NOTE — Consult Note (Signed)
Consultation Note Date: 02/09/2019   Patient Name: Emily Owen  DOB: 28-Apr-1930  MRN: [id number]  Age / Sex: 85 y.o., female  PCP: [name], MD Referring Physician: Nita Sells, MD  Reason for Consultation: Establishing goals of care  HPI/Patient Profile: 85 y.o. female  with past medical history of COPD, osteoporosis, DMII, recent admission for pneumonia, frequent falls admitted on 02/07/2019 with chest tightness/SOB, altered mental status after not wearing oxygen at home. Workup revealed recurrent pneumonia- ?aspiration- SLP eval with slight aspiration risk, now on soft dysphagia diet with precautions,  COPD exacerbation, pulmonary nodules. Palliative medicine consulted per request of family for [place] and advanced directives.   Clinical Assessment and Goals of Care: I met with patient at the bedside and with her daughter- Emily Owen via speakerphone. Patient was awake and oriented and able to participate in Emily Owen discussion.  Life review was conducted- she has several children- Emily Owen helps with medical decision making. 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   Prior to admission she was living at home with her daughter Emily Owen. Able to ambulate small distances in the home- mostly living bed to chair. Emily Owen bathes her twice per week. She toilets herself. She feeds herself, others prepare her meals for her. She enjoys reading and watching tv in her free time. She feels she currently has good quality of life and would not say she has any form of suffering.   When discussing EOL wishes- she states she hopes she dies peacefully like her husband did. She says she has had a good life and raising her children has been the most important thing to her.    Advanced care planning and GOC were discussed. She wishes to continue life prolonging measures with limits of  DNR which is in place. Electronic MOST form via [employer]  was completed with choices indicating DNR, limited medical interventions (return to hospital OK), antibiotics if indicated, IV fluids if indicated, no feeding tube.  Patient and daughter agree to follow up from Palliative provider at home.   Primary Decision Maker PATIENT    SUMMARY OF RECOMMENDATIONS -Continue current scope of care -D/C home with palliative and home health  Prognosis:    Unable to determine  Primary Diagnoses: Present on Admission: . AMS (altered mental status) . Aspiration into airway   I have reviewed the medical record, interviewed the patient and family, and examined the patient. The following aspects are pertinent.  Past Medical History:  Diagnosis Date  . Cancer ([place])    colon  . Cancer of cervix ([place])    cervix  . COPD (chronic obstructive pulmonary disease) ([place])   . Diabetes mellitus without complication ([place])   . Gallstones   . Gout   . Hard of hearing   . On home O2   . Osteoporosis   . Pleural effusion   . Raynaud's disease    Social History   Socioeconomic History  . Marital status: Widowed    Spouse name: Not on  file  . Number of children: Not on file  . Years of education: Not on file  . Highest education level: Not on file  Occupational History  . Not on file  Social Needs  . Financial resource strain: Not on file  . Food insecurity:    Worry: Not on file    Inability: Not on file  . Transportation needs:    Medical: Not on file    Non-medical: Not on file  Tobacco Use  . Smoking status: Former Smoker    Packs/day: 2.00    Years: 35.00    Pack years: 70.00    Types: Cigarettes    Last attempt to quit: 08/11/1994    Years since quitting: 24.5  . Smokeless tobacco: Never Used  Substance and Sexual Activity  . Alcohol use: No    Alcohol/week: 0.0 standard drinks  . Drug use: No  . Sexual activity: Not on file  Lifestyle  . Physical activity:    Days per week:  Not on file    Minutes per session: Not on file  . Stress: Not on file  Relationships  . Social connections:    Talks on phone: Not on file    Gets together: Not on file    Attends religious service: Not on file    Active member of club or organization: Not on file    Attends meetings of clubs or organizations: Not on file    Relationship status: Not on file  Other Topics Concern  . Not on file  Social History Narrative  . Not on file   Family History  Problem Relation Age of Onset  . Diabetes Sister   . Diabetes Sister   . Diabetes Sister   . Hypertension Mother   . Heart disease Mother   . Diabetes Sister    Scheduled Meds: . amoxicillin-clavulanate  1 tablet Oral Q12H  . Chlorhexidine Gluconate Cloth  6 each Topical Q0600  . enoxaparin (LOVENOX) injection  40 mg Subcutaneous Q24H  . insulin aspart  0-9 Units Subcutaneous TID WC  . rosuvastatin  20 mg Oral Daily  . umeclidinium-vilanterol  1 puff Inhalation Daily   Continuous Infusions: . sodium chloride Stopped (02/08/19 1736)   PRN Meds:.sodium chloride, ipratropium-albuterol, ondansetron **OR** ondansetron (ZOFRAN) IV, polyethylene glycol Medications Prior to Admission:  Prior to Admission medications   Medication Sig Start Date End Date Taking? Authorizing Provider  alendronate (FOSAMAX) 70 MG tablet TAKE 1 TABLET BY MOUTH EVERY 7 DAYS. TAKE WITH A FULL GLASS OF WATER ON AN EMPTY STOMACH. 11/13/18  Yes [name], MD  amLODipine (NORVASC) 5 MG tablet Take 1 tablet (5 mg total) by mouth daily. 03/05/18  Yes [name], MD  ANORO ELLIPTA 62.5-25 MCG/INH AEPB TAKE 1 PUFF BY MOUTH EVERY DAY 12/09/18  Yes Juanito Doom, MD  Balsam [place]-Castor Oil [facility]) OINT Apply to bilateral buttocks and sacrum qshift and prn for shear to buttocks and prevention. Every shift   Yes [provider]  gabapentin (NEURONTIN) 100 MG capsule Take 200 mg by mouth at bedtime.   Yes [provider]   Ipratropium-Albuterol (COMBIVENT RESPIMAT) 20-100 MCG/ACT AERS respimat Inhale 1 puff into the lungs every 4 (four) hours as needed for wheezing.   Yes [provider]  OXYGEN Inhale 3 L into the lungs continuous. [employer]   Yes [provider]  Respiratory Therapy Supplies (FLUTTER) DEVI Use as directed. 10/01/18  Yes Parrett, Fonnie Mu, NP  rosuvastatin (CRESTOR)  20 MG tablet Take 1 tablet (20 mg total) by mouth daily. 02/27/18  Yes [name], MD  traMADol (ULTRAM) 50 MG tablet Take 1 tablet (50 mg total) by mouth every 8 (eight) hours as needed for up to 5 days. 02/06/19 02/11/19 Yes [name], MD  colchicine 0.6 MG tablet Take 0.6 mg by mouth 2 (two) times daily as needed. 01/17/19   [provider]  potassium chloride SA (K-DUR,KLOR-CON) 20 MEQ tablet Take 1 tablet (20 mEq total) by mouth daily for 5 days. 01/27/19 02/01/19  [name], MD   No Known Allergies Review of Systems  Constitutional: Positive for fatigue. Negative for unexpected weight change.  Respiratory: Positive for shortness of breath.   Psychiatric/Behavioral: Positive for decreased concentration. Negative for sleep disturbance.    Physical Exam Vitals signs and nursing note reviewed.  Constitutional:      Appearance: Normal appearance.  Pulmonary:     Effort: Pulmonary effort is normal.  Neurological:     Mental Status: She is alert and oriented to person, place, and time.  Psychiatric:        Mood and Affect: Mood normal.        Behavior: Behavior normal.        Thought Content: Thought content normal.     Vital Signs: BP 114/60 (BP Location: Left Arm)   Pulse 86   Temp 98.6 F (37 C) (Oral)   Resp 16   Ht _0  (1.626 m)   Wt 79.6 kg   SpO2 95%   BMI 30.12 kg/m  Pain Scale: 0-10   Pain Score: 0-No pain   SpO2: SpO2: 95 % O2 Device:SpO2: 95 % O2 Flow Rate: .O2 Flow Rate (L/min): 2 L/min  IO: Intake/output summary:   Intake/Output Summary (Last 24 hours)  at 02/09/2019 1658 Last data filed at 02/09/2019 1300 Gross per 24 hour  Intake 518.68 ml  Output 1350 ml  Net -831.32 ml    LBM: Last BM Date: 02/08/19 Baseline Weight: Weight: 82 kg Most recent weight: Weight: 79.6 kg     Palliative Assessment/Data: PPS: 50%     Thank you for this consult. Palliative medicine will continue to follow and assist as needed.   Time In: 1530 Time Out: 1700 Time Total: 90 minutes Prolonged services billed: yes Greater than 50%  of this time was spent counseling and coordinating care related to the above assessment and plan.  Signed by: Mariana Kaufman, AGNP-C Palliative Medicine    Please contact Palliative Medicine Team phone at [phone] for questions and concerns.  For individual provider: See Shea Evans

## 2019-02-10 DIAGNOSIS — T17908D Unspecified foreign body in respiratory tract, part unspecified causing other injury, subsequent encounter: Secondary | ICD-10-CM

## 2019-02-10 LAB — CBC WITH DIFFERENTIAL/PLATELET
Abs Immature Granulocytes: 0.04 10*3/uL (ref 0.00–0.07)
Basophils Absolute: 0 10*3/uL (ref 0.0–0.1)
Basophils Relative: 1 %
Eosinophils Absolute: 0.1 10*3/uL (ref 0.0–0.5)
Eosinophils Relative: 3 %
HCT: 33.9 % — ABNORMAL LOW (ref 36.0–46.0)
Hemoglobin: 9.8 g/dL — ABNORMAL LOW (ref 12.0–15.0)
Immature Granulocytes: 1 %
Lymphocytes Relative: 24 %
Lymphs Abs: 1 10*3/uL (ref 0.7–4.0)
MCH: 26.7 pg (ref 26.0–34.0)
MCHC: 28.9 g/dL — ABNORMAL LOW (ref 30.0–36.0)
MCV: 92.4 fL (ref 80.0–100.0)
Monocytes Absolute: 0.4 10*3/uL (ref 0.1–1.0)
Monocytes Relative: 11 %
Neutro Abs: 2.4 10*3/uL (ref 1.7–7.7)
Neutrophils Relative %: 60 %
Platelets: 179 10*3/uL (ref 150–400)
RBC: 3.67 MIL/uL — ABNORMAL LOW (ref 3.87–5.11)
RDW: 14.4 % (ref 11.5–15.5)
WBC: 4.1 10*3/uL (ref 4.0–10.5)
nRBC: 0 % (ref 0.0–0.2)

## 2019-02-10 LAB — COMPREHENSIVE METABOLIC PANEL
ALT: 15 U/L (ref 0–44)
AST: 15 U/L (ref 15–41)
Albumin: 2.9 g/dL — ABNORMAL LOW (ref 3.5–5.0)
Alkaline Phosphatase: 56 U/L (ref 38–126)
Anion gap: 8 (ref 5–15)
BUN: 12 mg/dL (ref 8–23)
CO2: 35 mmol/L — ABNORMAL HIGH (ref 22–32)
Calcium: 7.7 mg/dL — ABNORMAL LOW (ref 8.9–10.3)
Chloride: 97 mmol/L — ABNORMAL LOW (ref 98–111)
Creatinine, Ser: 0.84 mg/dL (ref 0.44–1.00)
GFR calc Af Amer: >60 mL/min (ref >60)
GFR calc non Af Amer: >60 mL/min (ref >60)
Glucose, Bld: 131 mg/dL — ABNORMAL HIGH (ref 70–99)
Potassium: 3.7 mmol/L (ref 3.5–5.1)
Sodium: 140 mmol/L (ref 135–145)
Total Bilirubin: 0.4 mg/dL (ref 0.3–1.2)
Total Protein: 5.8 g/dL — ABNORMAL LOW (ref 6.5–8.1)

## 2019-02-10 LAB — GLUCOSE, CAPILLARY
Glucose-Capillary: 126 mg/dL — ABNORMAL HIGH (ref 70–99)
Glucose-Capillary: 155 mg/dL — ABNORMAL HIGH (ref 70–99)

## 2019-02-10 MED ORDER — AMOXICILLIN-POT CLAVULANATE 875-125 MG PO TABS: 1.0000 | ORAL_TABLET | Freq: Two times a day (BID) | ORAL | 0 refills | Status: AC

## 2019-02-10 NOTE — Clinical Social Work Note (Signed)
Patient's hospital bed ordered through [facility] at [facility]. Bed will be delivered to patient's home in coming days.     Abrahm Mancia, Clydene Pugh, LCSW

## 2019-02-10 NOTE — Progress Notes (Signed)
Palliative- No charge note:   Received call from patient's daughter, Caryl Comes, last night-  She was concerned because she called her mom in the evening and noted that her mom was confused. She also noted that her Mom was not at her baseline mental status during our conversation regarding [place] discussion. We discussed possible hospital delirium, possible decline due to hospital admission and hypoxic episode.   [name] does not think [place] would differ from documented discussion.  Mariana Kaufman, AGNP-C Palliative Medicine  Please call Palliative Medicine team phone with any questions [phone]. For individual providers please see [facility].

## 2019-02-10 NOTE — Care Management (Addendum)
Patient requires frequent re-positioning of the body in ways that cannot be achieved with an ordinary bed or wedge pillow, to eliminate pain, reduce pressure, and the head of the bed to be elevated more than 30 degrees most of the time due to COPD 

## 2019-02-10 NOTE — Discharge Summary (Signed)
Physician Discharge Summary  Emily Owen QMG:[id number] DOB: 1930/08/29 DOA: 02/07/2019  PCP: [name], MD  Admit date: 02/07/2019 Discharge date: 02/10/2019  Admitted From: Home  Disposition:  Home   Recommendations for Outpatient Follow-up and new medication changes:  1. Follow up with Dr. Dennard Schaumann in 7 days.  2. Patient will continue antibiotic therapy with Augmentin.  3. Aspiration precautions.  4. Incidental findings:   A) 15 mm right thyroid nodule slightly larger compared to prior examination 2016.  B) stable 9 x 4 mm left apical pulmonary nodule. C) +3 mm left ICA terminus aneurysm.  Home Health: Yes  Equipment/Devices: Hospital bed   Discharge Condition: stable  CODE STATUS: DNR   Diet recommendation: Dysphagia 3   Dysphagia 3 (Mech soft) solids;Thin liquid(due to edentulous status) Liquid Administration via: Cup;Straw Medication Administration: Whole meds with liquid Supervision: Patient able to self feed;Intermittent supervision to cue for compensatory strategies. Compensations: Slow rate;Small sips/bites   Postural Changes: Remain semi-upright after after feeds/meals (Comment);Seated upright at 90 degrees  Brief/Interim Summary: 85 year old female who presented with altered mental status.  She does have significant past medical history of chronic hypoxic respiratory failure due to COPD, hypertension, and type 2 diabetes mellitus.  At home patient was noted to have altered mentation, confusion, refusing to use his oxygen.  On her initial physical examination her temperature was 101.5 F, she was in respiratory distress and was placed on BiPAP, her blood pressure was 111/59, pulse rate 89, respiratory rate 16, oxygen saturation 94%, lungs were clear to auscultation bilaterally, heart S1-S2 present and rhythmic, abdomen soft, no lower extremity edema, she was able to respond simple questions.  Her ABG had a pH of 7.35, PCO2 72.8, PO2 196, bicarb of 13.6,  sodium 138, potassium 4.9, chloride 92, bicarb 38, glucose 249, BUN 27, creatinine 0.96, troponin 0.05, white count 5.5, hemoglobin 9.2, hematocrit 30.7, platelets 188, her viral respiratory panel was negative, SARS-COViD-2 negative, her urinalysis had 21-50 white cells.  Head CT was negative for acute changes.  Chest radiograph was rotated to the right, right lower lobe infiltrate.  Her EKG had 84 bpm, first-degree AV block, normal axis, normal QTC, no ST segment or T wave changes.  Patient was admitted to the hospital with a working diagnosis of metabolic encephalopathy due to community-acquired pneumonia, right lower lobe.  1.  Right lower lobe community-acquired pneumonia, aspiration pneumonia, present on admission/complicated by acute on chronic hypoxic and hypercapnic respiratory failure.  Patient was admitted to the medical ward, she was placed on a remote telemetry monitor, she was successfully weaned off BiPAP to a nasal cannula.  Further work-up with CT chest showed right base infiltrate with small pleural effusion. She received broad spectrum antibiotic therapy with IV Zosyn, she has remained afebrile, no significant leukocytosis, and she has been transitioned to oral Augmentin.  She had a speech evaluation with recommendations for dysphasia 3 diet.  2.  COPD with chronic hypoxic respiratory failure.  No signs of acute exacerbation, continue supplemental oxygen per nasal cannula, at home she uses 3 L/min.  3.  Type 2 diabetes mellitus.  Patient was placed on insulin sliding scale for glucose coverage and monitoring.  4.  Metabolic encephalopathy.  Likely related to community-acquired pneumonia, clinically has improved, she had further CT angiography which showed no significant stenosis.  She ruled out for CVA.  5.  Obesity.  Calculated BMI 30.1  6. HTN. Continue blood pressure control with amlodipine.   Discharge Diagnoses:  Active Problems:  AMS (altered mental status)   Aspiration  into airway   Chronic obstructive pulmonary disease with acute exacerbation (HCC)   Acute on chronic respiratory failure with hypoxia and hypercapnia ([place])   Palliative care by specialist   Advanced care planning/counseling discussion   Goals of care, counseling/discussion    Discharge Instructions   Allergies as of 02/10/2019   No Known Allergies     Medication List    STOP taking these medications   potassium chloride SA 20 MEQ tablet Commonly known as:  K-DUR     TAKE these medications   alendronate 70 MG tablet Commonly known as:  FOSAMAX TAKE 1 TABLET BY MOUTH EVERY 7 DAYS. TAKE WITH A FULL GLASS OF WATER ON AN EMPTY STOMACH.   amLODipine 5 MG tablet Commonly known as:  NORVASC Take 1 tablet (5 mg total) by mouth daily.   amoxicillin-clavulanate 875-125 MG tablet Commonly known as:  AUGMENTIN Take 1 tablet by mouth every 12 (twelve) hours for 5 days.   Anoro Ellipta 62.5-25 MCG/INH Aepb Generic drug:  umeclidinium-vilanterol TAKE 1 PUFF BY MOUTH EVERY DAY   colchicine 0.6 MG tablet Take 0.6 mg by mouth 2 (two) times daily as needed.   Combivent Respimat 20-100 MCG/ACT Aers respimat Generic drug:  Ipratropium-Albuterol Inhale 1 puff into the lungs every 4 (four) hours as needed for wheezing.   Flutter Devi Use as directed.   gabapentin 100 MG capsule Commonly known as:  NEURONTIN Take 200 mg by mouth at bedtime.   OXYGEN Inhale 3 L into the lungs continuous. [employer]   rosuvastatin 20 MG tablet Commonly known as:  Crestor Take 1 tablet (20 mg total) by mouth daily.   traMADol 50 MG tablet Commonly known as:  ULTRAM Take 1 tablet (50 mg total) by mouth every 8 (eight) hours as needed for up to 5 days.   Venelex Oint Apply to bilateral buttocks and sacrum qshift and prn for shear to buttocks and prevention. Every shift            Durable Medical Equipment  (From admission, onward)         Start     Ordered   02/10/19 0000  DME Hospital  bed    Question Answer Comment  The above medical condition requires: Patient requires the ability to reposition frequently   Head must be elevated greater than: 45 degrees   Bed type Semi-electric      02/10/19 1050   02/09/19 1533  For home use only DME Hospital bed  Once    Question Answer Comment  The above medical condition requires: Patient requires the ability to reposition immediately   Head must be elevated greater than: 45 degrees   Bed type Semi-electric      02/09/19 1532          No Known Allergies  Consultations:     Procedures/Studies: Ct Angio Head W Or Wo Contrast  Result Date: 02/07/2019 CLINICAL DATA:  85 year old female with altered mental status, confusion. EXAM: CT ANGIOGRAPHY HEAD AND NECK TECHNIQUE: Multidetector CT imaging of the head and neck was performed using the standard protocol during bolus administration of intravenous contrast. Multiplanar CT image reconstructions and MIPs were obtained to evaluate the vascular anatomy. Carotid stenosis measurements (when applicable) are obtained utilizing NASCET criteria, using the distal internal carotid diameter as the denominator. CONTRAST:  81mL OMNIPAQUE IOHEXOL 350 MG/ML SOLN COMPARISON:  Head CT without contrast 1329 hours today. Chest CTA today reported separately. CT head  and cervical spine 05/31/2015. FINDINGS: CTA NECK Skeleton: Absent dentition. Osteopenia. No acute osseous abnormality identified. Upper chest: Reported separately today. Other neck: Mixed density 15 millimeter right thyroid nodule, stable to minimally increased since 2016 and significance doubtful. Chronic asymmetric density along the posterior right vocal fold is stable since 2016 and probably postoperative in nature. Otherwise negative. Aortic arch: 3 vessel arch configuration. Mild for age Calcified aortic atherosclerosis. Right carotid system: Mild plaque at the brachiocephalic artery and right CCA origin without stenosis. Mildly tortuous  right CCA. Minimal plaque at the right carotid bifurcation. Mild tortuosity of the cervical right ICA without stenosis. Left carotid system: Mild plaque at the left CCA origin without stenosis. Minimal to mild plaque at the left carotid bifurcation without stenosis. Retropharyngeal course of the left ICA with mild tortuosity just below the skull base. Vertebral arteries: Mild proximal right subclavian artery plaque with no stenosis there or at the right vertebral artery origin. Patent and negative right vertebral artery to the skull base. Proximal left subclavian artery plaque without stenosis. Normal left vertebral artery origin. Codominant vertebral arteries, the left is patent to the skull base without stenosis. CTA HEAD Posterior circulation: Codominant and normal distal vertebral arteries. Normal PICA origins and vertebrobasilar junction. Patent basilar artery without stenosis. Fetal type bilateral PCA origins. Patent SCA and AICA origins. Mildly tortuous posterior communicating arteries. Bilateral PCA branches are within normal limits. Anterior circulation:  Both ICA siphons are patent. Mild calcified plaque on the left without stenosis. Normal left ophthalmic and posterior communicating artery origins, although opposite the left Pcomm origin elected directed cephalad near the left ICA terminus there is a subtle 2-3 millimeter aneurysm with a broad neck best demonstrated on series 8, image 115. See also series 7, image 91. Right ICA siphon is patent with mild ectasia and minimal plaque. Normal right posterior communicating artery origin. Patent carotid termini. Normal MCA and ACA origins. Anterior communicating artery and bilateral ACA branches are within normal limits. Left MCA M1 segment and trifurcation are patent without stenosis. Left MCA branches are within normal limits. Right MCA M1 and bifurcation are patent without stenosis. Right MCA branches are within normal limits. Venous sinuses: Patent.  Anatomic variants: Fetal type bilateral PCA origins. Review of the MIP images confirms the above findings IMPRESSION: 1. Negative for emergent large vessel occlusion or arterial stenosis in the head or neck. 2. Positive for a 3 mm Left ICA terminus aneurysm, subtle and directed cephalad opposite the left posterior communicating artery origin. 3. Chest CT today reported separately. Electronically Signed   By: Genevie Ann M.D.   On: 02/07/2019 16:59   Dg Chest 2 View  Result Date: 02/09/2019 CLINICAL DATA:  Cigarette smoker admitted with a diagnosis of pneumonia 02/07/2019. EXAM: CHEST - 2 VIEW COMPARISON:  CT of the chest and single view of the chest 02/07/2019. FINDINGS: The lungs are emphysematous. Small right pleural effusion and basilar airspace disease persist. The left lung appears clear. Heart size is normal. No pneumothorax. No acute bony abnormality. IMPRESSION: No change in a small right pleural effusion and basilar airspace disease. Emphysema. Electronically Signed   By: Inge Rise M.D.   On: 02/09/2019 13:49   Dg Chest 2 View  Result Date: 01/14/2019 CLINICAL DATA:  Cough and shortness of breath for 2 weeks. EXAM: CHEST - 2 VIEW COMPARISON:  01/13/2019 FINDINGS: Cardiomediastinal silhouette is normal. Mediastinal contours appear intact. Calcific atherosclerotic disease of the aorta. Persistent nodular airspace consolidation versus subpleural thickening/pulmonary nodule in the  right lower lobe, and right subhilar region. Persistent right pleural effusion. Newly appreciated left pleural/subpleural thickening. Osseous structures are without acute abnormality. Soft tissues are grossly normal. IMPRESSION: 1. Persistent nodular airspace consolidation versus subpleural thickening/pulmonary nodule in the right lower lobe and right subhilar region. 2. Persistent right pleural effusion. 3. Newly appreciated left pleural/subpleural thickening. Electronically Signed   By: Fidela Salisbury M.D.   On:  01/14/2019 13:11   Dg Chest 2 View  Result Date: 01/13/2019 CLINICAL DATA:  Diminished breath sounds bilaterally. EXAM: CHEST - 2 VIEW COMPARISON:  10/01/2018 FINDINGS: Cardiomediastinal silhouette is normal. Mediastinal contours appear intact. Persistent right pleural effusion with right lower lobe atelectasis versus airspace consolidation. Fullness in the right subhilar region. Osseous structures are without acute abnormality. Soft tissues are grossly normal. IMPRESSION: 1. Persistent right pleural effusion with right lower lobe atelectasis versus airspace consolidation. 2. Fullness in the right subhilar region may represent overlapping vascular shadows, lymphadenopathy or potentially a pulmonary mass. Electronically Signed   By: Fidela Salisbury M.D.   On: 01/13/2019 16:29   Ct Head Wo Contrast  Result Date: 02/07/2019 CLINICAL DATA:  Altered mental status EXAM: CT HEAD WITHOUT CONTRAST TECHNIQUE: Contiguous axial images were obtained from the base of the skull through the vertex without intravenous contrast. COMPARISON:  11/08/2018 FINDINGS: Brain: No evidence of acute infarction, hemorrhage, hydrocephalus, extra-axial collection or mass lesion/mass effect. Periventricular white matter low attenuation as can be seen with microvascular disease. Mild generalized cerebral atrophy. Vascular: No hyperdense vessel or unexpected calcification. Skull: No osseous abnormality. Sinuses/Orbits: Visualized paranasal sinuses are clear. Visualized mastoid sinuses are clear. Visualized orbits demonstrate no focal abnormality. Other: None IMPRESSION: No acute intracranial pathology. Electronically Signed   By: Kathreen Devoid   On: 02/07/2019 13:49   Ct Angio Neck W And/or Wo Contrast  Result Date: 02/07/2019 CLINICAL DATA:  85 year old female with altered mental status, confusion. EXAM: CT ANGIOGRAPHY HEAD AND NECK TECHNIQUE: Multidetector CT imaging of the head and neck was performed using the standard protocol  during bolus administration of intravenous contrast. Multiplanar CT image reconstructions and MIPs were obtained to evaluate the vascular anatomy. Carotid stenosis measurements (when applicable) are obtained utilizing NASCET criteria, using the distal internal carotid diameter as the denominator. CONTRAST:  74mL OMNIPAQUE IOHEXOL 350 MG/ML SOLN COMPARISON:  Head CT without contrast 1329 hours today. Chest CTA today reported separately. CT head and cervical spine 05/31/2015. FINDINGS: CTA NECK Skeleton: Absent dentition. Osteopenia. No acute osseous abnormality identified. Upper chest: Reported separately today. Other neck: Mixed density 15 millimeter right thyroid nodule, stable to minimally increased since 2016 and significance doubtful. Chronic asymmetric density along the posterior right vocal fold is stable since 2016 and probably postoperative in nature. Otherwise negative. Aortic arch: 3 vessel arch configuration. Mild for age Calcified aortic atherosclerosis. Right carotid system: Mild plaque at the brachiocephalic artery and right CCA origin without stenosis. Mildly tortuous right CCA. Minimal plaque at the right carotid bifurcation. Mild tortuosity of the cervical right ICA without stenosis. Left carotid system: Mild plaque at the left CCA origin without stenosis. Minimal to mild plaque at the left carotid bifurcation without stenosis. Retropharyngeal course of the left ICA with mild tortuosity just below the skull base. Vertebral arteries: Mild proximal right subclavian artery plaque with no stenosis there or at the right vertebral artery origin. Patent and negative right vertebral artery to the skull base. Proximal left subclavian artery plaque without stenosis. Normal left vertebral artery origin. Codominant vertebral arteries, the left is patent  to the skull base without stenosis. CTA HEAD Posterior circulation: Codominant and normal distal vertebral arteries. Normal PICA origins and vertebrobasilar  junction. Patent basilar artery without stenosis. Fetal type bilateral PCA origins. Patent SCA and AICA origins. Mildly tortuous posterior communicating arteries. Bilateral PCA branches are within normal limits. Anterior circulation:  Both ICA siphons are patent. Mild calcified plaque on the left without stenosis. Normal left ophthalmic and posterior communicating artery origins, although opposite the left Pcomm origin elected directed cephalad near the left ICA terminus there is a subtle 2-3 millimeter aneurysm with a broad neck best demonstrated on series 8, image 115. See also series 7, image 91. Right ICA siphon is patent with mild ectasia and minimal plaque. Normal right posterior communicating artery origin. Patent carotid termini. Normal MCA and ACA origins. Anterior communicating artery and bilateral ACA branches are within normal limits. Left MCA M1 segment and trifurcation are patent without stenosis. Left MCA branches are within normal limits. Right MCA M1 and bifurcation are patent without stenosis. Right MCA branches are within normal limits. Venous sinuses: Patent. Anatomic variants: Fetal type bilateral PCA origins. Review of the MIP images confirms the above findings IMPRESSION: 1. Negative for emergent large vessel occlusion or arterial stenosis in the head or neck. 2. Positive for a 3 mm Left ICA terminus aneurysm, subtle and directed cephalad opposite the left posterior communicating artery origin. 3. Chest CT today reported separately. Electronically Signed   By: Genevie Ann M.D.   On: 02/07/2019 16:59   Ct Chest Wo Contrast  Addendum Date: 02/07/2019   ADDENDUM REPORT: 02/07/2019 17:27 ADDENDUM: Not mentioned in the impression. 15 mm right thyroid nodule slightly larger compared with the prior examination of 03/25/2015. If there is further clinical concern, recommend a thyroid ultrasound. Electronically Signed   By: Kathreen Devoid   On: 02/07/2019 17:27   Result Date: 02/07/2019 CLINICAL DATA:   Altered mental status, abnormal chest x-ray EXAM: CT CHEST WITHOUT CONTRAST TECHNIQUE: Multidetector CT imaging of the chest was performed following the standard protocol without IV contrast. COMPARISON:  01/14/2019 FINDINGS: Cardiovascular: No significant vascular findings. Normal heart size. No pericardial effusion. Multi vessel coronary artery atherosclerosis. Thoracic aortic atherosclerosis. Mediastinum/Nodes: No enlarged mediastinal or axillary lymph nodes. Trachea, and esophagus demonstrate no significant findings. 15 mm right thyroid nodule. Lungs/Pleura: 8 x 4 mm left apical pulmonary nodule. Bilateral centrilobular emphysema. Small right pleural effusion. Mild right basilar atelectasis. No pneumothorax. No focal consolidation to suggest pneumonia. Upper Abdomen: Cholelithiasis. No acute upper abdominal abnormality. Musculoskeletal: No acute osseous abnormality. No aggressive osseous lesion. Syndesmophytes and ossification of the disc spaces throughout the thoracic spine as can be seen with ankylosing spondylitis. IMPRESSION: 1. Small right pleural effusion with bibasilar atelectasis. 2. Stable 9 x 4 mm left apical pulmonary nodule. Non-contrast chest CT at 6-12 months is recommended. If the nodule is stable at time of repeat CT, then future CT at 18-24 months (from today's scan) is considered optional for low-risk patients, but is recommended for high-risk patients. This recommendation follows the consensus statement: Guidelines for Management of Incidental Pulmonary Nodules Detected on CT Images: From the Fleischner Society 2017; Radiology 2017; 284:228-243. 3. Aortic Atherosclerosis (ICD10-I70.0) and Emphysema (ICD10-J43.9). 4. Cholelithiasis. Electronically Signed: By: Kathreen Devoid On: 02/07/2019 17:02   Ct Chest Wo Contrast  Result Date: 01/14/2019 CLINICAL DATA:  Shortness of breath and cough. Rule out pneumonia. Fall 1 week ago. EXAM: CT CHEST WITHOUT CONTRAST TECHNIQUE: Multidetector CT imaging  of the chest was performed following  the standard protocol without IV contrast. COMPARISON:  Chest x-rays January 14, 2019 and January 13, 2019. FINDINGS: Cardiovascular: Coronary artery calcifications involve the right and left coronary arteries. The heart size is normal. The thoracic aorta demonstrates atherosclerotic change without aneurysmal dilatation. The main pulmonary artery is normal in appearance. Mediastinum/Nodes: A right thyroid lobe nodule measures 18 mm. The thyroid is otherwise normal. There is a right-sided pleural effusion. No left-sided pleural effusion or pericardial effusion. The visualized esophagus is unremarkable. No adenopathy in the chest. Lungs/Pleura: There is mild mucus in the trachea. There is debris in the right bronchus intermedius and right lower lobe bronchus. There is opacification of some medial left lower lobe bronchi as well, likely filled with debris. No pneumothorax. There is a nodule in the left upper lobe measuring 8 by 10 by 7 mm with a mean diameter of 8 mm. No other nodules or masses. Mild emphysematous changes in the lungs. The left lung is otherwise unremarkable. The findings on today's chest x-ray not seen on today's CT scan including the scout view. There is a right-sided pleural effusion with underlying opacity. No other abnormalities in either lung. Upper Abdomen: Cholelithiasis is identified. The upper abdomen is otherwise unremarkable. Musculoskeletal: No chest wall mass or suspicious bone lesions identified. IMPRESSION: 1. There is a nodule in the left upper lobe with a mean diameter of 8 mm. Non-contrast chest CT at 6-12 months is recommended. If the nodule is stable at time of repeat CT, then future CT at 18-24 months (from today's scan) is considered optional for low-risk patients, but is recommended for high-risk patients. This recommendation follows the consensus statement: Guidelines for Management of Incidental Pulmonary Nodules Detected on CT Images: From the  Fleischner Society 2017; Radiology 2017; 284:228-243. 2. There is a small to moderate right-sided pleural effusion with underlying opacity. The underlying opacity may simply represent atelectasis. 3. Debris in the bronchus intermedius and bilateral lower lobe bronchi consistent with aspirated material. 4. Atherosclerotic changes in the nonaneurysmal aorta. Coronary artery calcifications. 5. Cholelithiasis. 6. 18 mm right thyroid lobe nodule. A thyroid ultrasound could better evaluate. Aortic Atherosclerosis (ICD10-I70.0) and Emphysema (ICD10-J43.9). Electronically Signed   By: Dorise Bullion III M.D   On: 01/14/2019 16:28   Dg Chest Portable 1 View  Result Date: 02/07/2019 CLINICAL DATA:  Acute shortness of breath EXAM: PORTABLE CHEST 1 VIEW COMPARISON:  01/14/2019 CT, chest radiograph and prior studies FINDINGS: Patient is rotated and leaning towards the RIGHT. Increased RIGHT basilar opacity noted. The LEFT lung is clear. No pneumothorax or acute bony abnormality. IMPRESSION: Increased RIGHT basilar opacity-favor atelectasis. Electronically Signed   By: Margarette Canada M.D.   On: 02/07/2019 14:40   Dg Swallowing Func-speech Pathology  Result Date: 02/09/2019 Objective Swallowing Evaluation: Type of Study: MBS-Modified Barium Swallow Study  Patient Details Name: Emily Owen MRN: [id number] Date of Birth: 11-Jul-1930 Today's Date: 02/09/2019 Time: SLP Start Time (ACUTE ONLY): 1530 -SLP Stop Time (ACUTE ONLY): 1557 SLP Time Calculation (min) (ACUTE ONLY): 27 min Past Medical History: Past Medical History: Diagnosis Date . Cancer ([place])   colon . Cancer of cervix ([place])   cervix . COPD (chronic obstructive pulmonary disease) ([place])  . Diabetes mellitus without complication ([place])  . Gallstones  . Gout  . Hard of hearing  . On home O2  . Osteoporosis  . Pleural effusion  . Raynaud's disease  Past Surgical History: Past Surgical History: Procedure Laterality Date . ABDOMINAL HYSTERECTOMY    for cervical cancer 1965  .  COLON SURGERY    hemicolectomy 1998 . gallstone surgery    ERCP for gallstone pancreatitis . ORTHOPEDIC SURGERY   HPI: Emily Owen is a 85 y.o. female with medical history significant for acute on chronic respiratory failure with hypoxia chronic 3 L O2 at home, COPD, hypertension, DM, who was brought to the ED via EMS with reports of change in mental status.  Patient refused sides with her daughter [name] but has a second daughter called [name].  Per patient's daughter [name], last night patient was refusing to wear her home O2 refused to wear it for at least 7 hours..  She was also complaining of some chest pain but refused to come to the ED.  This morning patient was hanging of her recliner, and would not sit up straight.  Daughter was finally able to put O2 on patient later today.  Sats were not checked while patient was off O2. With decrease in patient's responsiveness, EMS was called. BSE requested.  Subjective: "I have had pneumonia a lot." Assessment / Plan / Recommendation CHL IP CLINICAL IMPRESSIONS 02/09/2019 Clinical Impression MBSS completed. Pt presents with normal oropharyngeal swallow for age when assessed with barium tinged thin, puree, and regular textures. Pt with a single episode of flash penetration (underepiglottic coating) of thins when taking mixed consistencies (thin and barium tablet), which was not immediately removed. It was removed after subsequent swallows of foods/liquids. The barium tablet was transiently delayed in the distal esophagus, but cleared with a bite of puree. Pt's risk factors for aspiration include advanced age and reduced respiratory support (COPD with chronic O2 at home). Pt reportedly does not often wear her dentures when eating so she was educated on the needs to chop foods and/or masticate foods well prior to swallow. SLP also provided education regarding role of COPD related to swallowing and risks for aspiration given compromised swallow/respiration reciprocity.  Recommend D3/mech soft and thin liquids with standard aspiration and reflux precautions. No further SLP services indicated at this time. SLP will sign off. SLP Visit Diagnosis Dysphagia, oropharyngeal phase (R13.12) Attention and concentration deficit following -- Frontal lobe and executive function deficit following -- Impact on safety and function Mild aspiration risk   CHL IP TREATMENT RECOMMENDATION 02/09/2019 Treatment Recommendations No treatment recommended at this time   Prognosis 02/09/2019 Prognosis for Safe Diet Advancement Fair Barriers to Reach Goals Cognitive deficits Barriers/Prognosis Comment -- CHL IP DIET RECOMMENDATION 02/09/2019 SLP Diet Recommendations Dysphagia 3 (Mech soft) solids;Thin liquid Liquid Administration via Cup;Straw Medication Administration Whole meds with liquid Compensations Slow rate;Small sips/bites Postural Changes Remain semi-upright after after feeds/meals (Comment);Seated upright at 90 degrees   CHL IP OTHER RECOMMENDATIONS 02/09/2019 Recommended Consults -- Oral Care Recommendations Oral care BID Other Recommendations Clarify dietary restrictions   CHL IP FOLLOW UP RECOMMENDATIONS 02/09/2019 Follow up Recommendations None   CHL IP FREQUENCY AND DURATION 02/09/2019 Speech Therapy Frequency (ACUTE ONLY) min 2x/week Treatment Duration 1 week      CHL IP ORAL PHASE 02/09/2019 Oral Phase WFL Oral - Pudding Teaspoon -- Oral - Pudding Cup -- Oral - Honey Teaspoon -- Oral - Honey Cup -- Oral - Nectar Teaspoon -- Oral - Nectar Cup -- Oral - Nectar Straw -- Oral - Thin Teaspoon -- Oral - Thin Cup -- Oral - Thin Straw -- Oral - Puree -- Oral - Mech Soft -- Oral - Regular -- Oral - Multi-Consistency -- Oral - Pill -- Oral Phase - Comment --  CHL IP PHARYNGEAL PHASE 02/09/2019 Pharyngeal Phase  Impaired Pharyngeal- Pudding Teaspoon -- Pharyngeal -- Pharyngeal- Pudding Cup -- Pharyngeal -- Pharyngeal- Honey Teaspoon -- Pharyngeal -- Pharyngeal- Honey Cup -- Pharyngeal -- Pharyngeal- Nectar  Teaspoon -- Pharyngeal -- Pharyngeal- Nectar Cup -- Pharyngeal -- Pharyngeal- Nectar Straw -- Pharyngeal -- Pharyngeal- Thin Teaspoon Delayed swallow initiation-vallecula;WFL Pharyngeal -- Pharyngeal- Thin Cup Delayed swallow initiation-vallecula;WFL Pharyngeal -- Pharyngeal- Thin Straw WFL Pharyngeal -- Pharyngeal- Puree WFL Pharyngeal -- Pharyngeal- Mechanical Soft -- Pharyngeal -- Pharyngeal- Regular WFL Pharyngeal -- Pharyngeal- Multi-consistency -- Pharyngeal -- Pharyngeal- Pill Penetration/Aspiration during swallow Pharyngeal Material enters airway, remains ABOVE vocal cords and not ejected out;Material enters airway, remains ABOVE vocal cords then ejected out Pharyngeal Comment --  CHL IP CERVICAL ESOPHAGEAL PHASE 02/09/2019 Cervical Esophageal Phase WFL Pudding Teaspoon -- Pudding Cup -- Honey Teaspoon -- Honey Cup -- Nectar Teaspoon -- Nectar Cup -- Nectar Straw -- Thin Teaspoon -- Thin Cup -- Thin Straw -- Puree -- Mechanical Soft -- Regular -- Multi-consistency -- Pill -- Cervical Esophageal Comment -- Thank you, Genene Churn, [place] PORTER,DABNEY 02/09/2019, 4:22 PM                 Procedures:   Subjective: Patient is feeling better, no nausea or vomiting, dyspnea has improved, no chest pain.   Discharge Exam: Vitals:   02/10/19 0526 02/10/19 0740  BP: 133/67   Pulse: 82   Resp: 18   Temp: 98.7 F (37.1 C)   SpO2: 91% 92%   Vitals:   02/09/19 2048 02/09/19 2110 02/10/19 0526 02/10/19 0740  BP:  116/61 133/67   Pulse:  81 82   Resp:  16 18   Temp:  98.1 F (36.7 C) 98.7 F (37.1 C)   TempSrc:  Oral Oral   SpO2: 96% 96% 91% 92%  Weight:      Height:        General: Not in pain or dyspnea  Neurology: Awake and alert, non focal  E ENT: mild pallor, no icterus, oral mucosa moist Cardiovascular: No JVD. S1-S2 present, rhythmic, no gallops, rubs, or murmurs. No lower extremity edema. Pulmonary: positive breath sounds bilaterally, adequate air movement, no  wheezing, or rhonchi, mild rales at bases. Gastrointestinal. Abdomen with no organomegaly, non tender, no rebound or guarding Skin. No rashes Musculoskeletal: no joint deformities   The results of significant diagnostics from this hospitalization (including imaging, microbiology, ancillary and laboratory) are listed below for reference.     Microbiology: Recent Results (from the past 240 hour(s))  Culture, Urine     Status: None   Collection Time: 02/07/19  1:24 PM  Result Value Ref Range Status   Specimen Description   Final    URINE, CLEAN CATCH Performed at [facility], [address] [place], [place] [address]    Special Requests   Final    NONE Performed at [facility], [address], [place], [place] [address]    Culture   Final    NO GROWTH Performed at [facility], [place] [address], [place], [place] [address]    Report Status 02/09/2019 FINAL  Final  Culture, blood (routine x 2)     Status: None (Preliminary result)   Collection Time: 02/07/19  1:50 PM  Result Value Ref Range Status   Specimen Description BLOOD LEFT ARM BOTTLES DRAWN AEROBIC AND ANAEROBIC  Final   Special Requests   Final    Blood Culture results may not be optimal due to an inadequate volume of blood received in culture bottles  Culture   Final    NO GROWTH 3 DAYS Performed at [facility], [address], [place], [place] [address]    Report Status PENDING  Incomplete  Culture, blood (routine x 2)     Status: None (Preliminary result)   Collection Time: 02/07/19  2:00 PM  Result Value Ref Range Status   Specimen Description   Final    BLOOD LEFT WRIST BOTTLES DRAWN AEROBIC AND ANAEROBIC   Special Requests   Final    Blood Culture results may not be optimal due to an inadequate volume of blood received in culture bottles   Culture   Final    NO GROWTH 3 DAYS Performed at [facility], [address], [place], [place] [address]    Report Status PENDING  Incomplete  SARS Coronavirus 2  [facility] order, Performed in [place] hospital lab)     Status: None   Collection Time: 02/07/19  2:15 PM  Result Value Ref Range Status   SARS Coronavirus 2 NEGATIVE NEGATIVE Final    Comment: (NOTE) If result is NEGATIVE SARS-CoV-2 target nucleic acids are NOT DETECTED. The SARS-CoV-2 RNA is generally detectable in upper and lower  respiratory specimens during the acute phase of infection. The lowest  concentration of SARS-CoV-2 viral copies this assay can detect is 250  copies / mL. A negative result does not preclude SARS-CoV-2 infection  and should not be used as the sole basis for treatment or other  patient management decisions.  A negative result may occur with  improper specimen collection / handling, submission of specimen other  than nasopharyngeal swab, presence of viral mutation(s) within the  areas targeted by this assay, and inadequate number of viral copies  (<250 copies / mL). A negative result must be combined with clinical  observations, patient history, and epidemiological information. If result is POSITIVE SARS-CoV-2 target nucleic acids are DETECTED. The SARS-CoV-2 RNA is generally detectable in upper and lower  respiratory specimens dur ing the acute phase of infection.  Positive  results are indicative of active infection with SARS-CoV-2.  Clinical  correlation with patient history and other diagnostic information is  necessary to determine patient infection status.  Positive results do  not rule out bacterial infection or co-infection with other viruses. If result is PRESUMPTIVE POSTIVE SARS-CoV-2 nucleic acids MAY BE PRESENT.   A presumptive positive result was obtained on the submitted specimen  and confirmed on repeat testing.  While 2019 novel coronavirus  (SARS-CoV-2) nucleic acids may be present in the submitted sample  additional confirmatory testing may be necessary for epidemiological  and / or clinical management purposes  to differentiate  between  SARS-CoV-2 and other Sarbecovirus currently known to infect humans.  If clinically indicated additional testing with an alternate test  methodology [phone]) is advised. The SARS-CoV-2 RNA is generally  detectable in upper and lower respiratory sp ecimens during the acute  phase of infection. The expected result is Negative. Fact Sheet for Patients:  StrictlyIdeas.no Fact Sheet for Healthcare Providers: BankingDealers.co.za This test is not yet approved or cleared by the [place] FDA and has been authorized for detection and/or diagnosis of SARS-CoV-2 by FDA under an Emergency Use Authorization (EUA).  This EUA will remain in effect (meaning this test can be used) for the duration of the COVID-19 declaration under Section 564(b)(1) of the Act, 21 U.S.C. section 360bbb-3(b)(1), unless the authorization is terminated or revoked sooner. Performed at [facility], [address], [place], [place] [address]  Respiratory Panel by PCR     Status: None   Collection Time: 02/07/19  2:15 PM  Result Value Ref Range Status   Adenovirus NOT DETECTED NOT DETECTED Final   Coronavirus 229E NOT DETECTED NOT DETECTED Final    Comment: (NOTE) The Coronavirus on the Respiratory Panel, DOES NOT test for the novel  Coronavirus (2019 nCoV)    Coronavirus HKU1 NOT DETECTED NOT DETECTED Final   Coronavirus NL63 NOT DETECTED NOT DETECTED Final   Coronavirus OC43 NOT DETECTED NOT DETECTED Final   Metapneumovirus NOT DETECTED NOT DETECTED Final   Rhinovirus / Enterovirus NOT DETECTED NOT DETECTED Final   Influenza A NOT DETECTED NOT DETECTED Final   Influenza B NOT DETECTED NOT DETECTED Final   Parainfluenza Virus 1 NOT DETECTED NOT DETECTED Final   Parainfluenza Virus 2 NOT DETECTED NOT DETECTED Final   Parainfluenza Virus 3 NOT DETECTED NOT DETECTED Final   Parainfluenza Virus 4 NOT DETECTED NOT DETECTED Final   Respiratory Syncytial Virus NOT  DETECTED NOT DETECTED Final   Bordetella pertussis NOT DETECTED NOT DETECTED Final   Chlamydophila pneumoniae NOT DETECTED NOT DETECTED Final   Mycoplasma pneumoniae NOT DETECTED NOT DETECTED Final    Comment: Performed at [facility], [address], [place], [place] [address]  MRSA PCR Screening     Status: None   Collection Time: 02/07/19  7:39 PM  Result Value Ref Range Status   MRSA by PCR NEGATIVE NEGATIVE Final    Comment:        The GeneXpert MRSA Assay (FDA approved for NASAL specimens only), is one component of a comprehensive MRSA colonization surveillance program. It is not intended to diagnose MRSA infection nor to guide or monitor treatment for MRSA infections. Performed at [facility], [address], [place],  [address]      Labs: BNP (last 3 results) Recent Labs    01/14/19 1230  BNP 25.9   Basic Metabolic Panel: Recent Labs  Lab 02/07/19 1348 02/07/19 1414 02/08/19 0439 02/10/19 0415  NA 138  --  142 140  K 4.9  --  4.7 3.7  CL 92*  --  96* 97*  CO2 38*  --  37* 35*  GLUCOSE 249*  --  142* 131*  BUN 27*  --  22 12  CREATININE 0.96 1.10* 0.97 0.84  CALCIUM 8.7*  --  8.3* 7.7*   Liver Function Tests: Recent Labs  Lab 02/07/19 1348 02/10/19 0415  AST 20 15  ALT 20 15  ALKPHOS 71 56  BILITOT 0.4 0.4  PROT 6.0* 5.8*  ALBUMIN 3.0* 2.9*   No results for input(s): LIPASE, AMYLASE in the last 168 hours. No results for input(s): AMMONIA in the last 168 hours. CBC: Recent Labs  Lab 02/07/19 1348 02/08/19 0439 02/10/19 0415  WBC 5.5 4.8 4.1  NEUTROABS 3.6  --  2.4  HGB 9.2* 9.7* 9.8*  HCT 30.7* 32.4* 33.9*  MCV 92.5 92.8 92.4  PLT 188 177 179   Cardiac Enzymes: Recent Labs  Lab 02/07/19 1348 02/07/19 1813  TROPONINI 0.05* 0.05*   BNP: Invalid input(s): POCBNP CBG: Recent Labs  Lab 02/09/19 0726 02/09/19 1115 02/09/19 1631 02/09/19 2111 02/10/19 0721  GLUCAP 126* 199* 214* 115* 126*   D-Dimer No results  for input(s): DDIMER in the last 72 hours. Hgb A1c No results for input(s): HGBA1C in the last 72 hours. Lipid Profile No results for input(s): CHOL, HDL, LDLCALC, TRIG, CHOLHDL, LDLDIRECT in the last 72  hours. Thyroid function studies No results for input(s): TSH, T4TOTAL, T3FREE, THYROIDAB in the last 72 hours.  Invalid input(s): FREET3 Anemia work up No results for input(s): VITAMINB12, FOLATE, FERRITIN, TIBC, IRON, RETICCTPCT in the last 72 hours. Urinalysis    Component Value Date/Time   COLORURINE YELLOW 02/07/2019 1324   APPEARANCEUR CLOUDY (A) 02/07/2019 1324   LABSPEC 1.019 02/07/2019 1324   PHURINE 5.0 02/07/2019 1324   GLUCOSEU 50 (A) 02/07/2019 1324   HGBUR NEGATIVE 02/07/2019 1324   BILIRUBINUR NEGATIVE 02/07/2019 1324   KETONESUR NEGATIVE 02/07/2019 1324   PROTEINUR 30 (A) 02/07/2019 1324   UROBILINOGEN 0.2 07/11/2015 1149   NITRITE NEGATIVE 02/07/2019 1324   LEUKOCYTESUR NEGATIVE 02/07/2019 1324   Sepsis Labs Invalid input(s): PROCALCITONIN,  WBC,  LACTICIDVEN Microbiology Recent Results (from the past 240 hour(s))  Culture, Urine     Status: None   Collection Time: 02/07/19  1:24 PM  Result Value Ref Range Status   Specimen Description   Final    URINE, CLEAN CATCH Performed at [facility], [address] [place] [address], [place], [place] [address]    Special Requests   Final    NONE Performed at [facility], [address] [place], [place] [address]    Culture   Final    NO GROWTH Performed at [facility], [place] [address], [place], [place] [address]    Report Status 02/09/2019 FINAL  Final  Culture, blood (routine x 2)     Status: None (Preliminary result)   Collection Time: 02/07/19  1:50 PM  Result Value Ref Range Status   Specimen Description BLOOD LEFT ARM BOTTLES DRAWN AEROBIC AND ANAEROBIC  Final   Special Requests   Final    Blood Culture results may not be optimal due to an inadequate volume of blood received in culture bottles   Culture   Final     NO GROWTH 3 DAYS Performed at [facility], [address] [place], [place] [address]    Report Status PENDING  Incomplete  Culture, blood (routine x 2)     Status: None (Preliminary result)   Collection Time: 02/07/19  2:00 PM  Result Value Ref Range Status   Specimen Description   Final    BLOOD LEFT WRIST BOTTLES DRAWN AEROBIC AND ANAEROBIC   Special Requests   Final    Blood Culture results may not be optimal due to an inadequate volume of blood received in culture bottles   Culture   Final    NO GROWTH 3 DAYS Performed at [facility], [address], [place], [place] [address]    Report Status PENDING  Incomplete  SARS Coronavirus 2 [facility] order, Performed in [place] hospital lab)     Status: None   Collection Time: 02/07/19  2:15 PM  Result Value Ref Range Status   SARS Coronavirus 2 NEGATIVE NEGATIVE Final    Comment: (NOTE) If result is NEGATIVE SARS-CoV-2 target nucleic acids are NOT DETECTED. The SARS-CoV-2 RNA is generally detectable in upper and lower  respiratory specimens during the acute phase of infection. The lowest  concentration of SARS-CoV-2 viral copies this assay can detect is 250  copies / mL. A negative result does not preclude SARS-CoV-2 infection  and should not be used as the sole basis for treatment or other  patient management decisions.  A negative result may occur with  improper specimen collection / handling, submission of specimen other  than nasopharyngeal swab, presence of viral mutation(s) within the  areas targeted by this assay, and inadequate number of viral copies  (<250 copies / mL). A negative result must be combined with clinical  observations, patient history, and epidemiological information. If result is POSITIVE SARS-CoV-2 target nucleic acids are DETECTED. The SARS-CoV-2 RNA is generally detectable in upper and lower  respiratory specimens dur ing the acute phase of infection.  Positive  results are indicative of active  infection with SARS-CoV-2.  Clinical  correlation with patient history and other diagnostic information is  necessary to determine patient infection status.  Positive results do  not rule out bacterial infection or co-infection with other viruses. If result is PRESUMPTIVE POSTIVE SARS-CoV-2 nucleic acids MAY BE PRESENT.   A presumptive positive result was obtained on the submitted specimen  and confirmed on repeat testing.  While 2019 novel coronavirus  (SARS-CoV-2) nucleic acids may be present in the submitted sample  additional confirmatory testing may be necessary for epidemiological  and / or clinical management purposes  to differentiate between  SARS-CoV-2 and other Sarbecovirus currently known to infect humans.  If clinically indicated additional testing with an alternate test  methodology [phone]) is advised. The SARS-CoV-2 RNA is generally  detectable in upper and lower respiratory sp ecimens during the acute  phase of infection. The expected result is Negative. Fact Sheet for Patients:  StrictlyIdeas.no Fact Sheet for Healthcare Providers: BankingDealers.co.za This test is not yet approved or cleared by the [place] FDA and has been authorized for detection and/or diagnosis of SARS-CoV-2 by FDA under an Emergency Use Authorization (EUA).  This EUA will remain in effect (meaning this test can be used) for the duration of the COVID-19 declaration under Section 564(b)(1) of the Act, 21 U.S.C. section 360bbb-3(b)(1), unless the authorization is terminated or revoked sooner. Performed at [facility], [address] [place], [place] [address]   Respiratory Panel by PCR     Status: None   Collection Time: 02/07/19  2:15 PM  Result Value Ref Range Status   Adenovirus NOT DETECTED NOT DETECTED Final   Coronavirus 229E NOT DETECTED NOT DETECTED Final    Comment: (NOTE) The Coronavirus on the Respiratory Panel, DOES NOT test for  the novel  Coronavirus (2019 nCoV)    Coronavirus HKU1 NOT DETECTED NOT DETECTED Final   Coronavirus NL63 NOT DETECTED NOT DETECTED Final   Coronavirus OC43 NOT DETECTED NOT DETECTED Final   Metapneumovirus NOT DETECTED NOT DETECTED Final   Rhinovirus / Enterovirus NOT DETECTED NOT DETECTED Final   Influenza A NOT DETECTED NOT DETECTED Final   Influenza B NOT DETECTED NOT DETECTED Final   Parainfluenza Virus 1 NOT DETECTED NOT DETECTED Final   Parainfluenza Virus 2 NOT DETECTED NOT DETECTED Final   Parainfluenza Virus 3 NOT DETECTED NOT DETECTED Final   Parainfluenza Virus 4 NOT DETECTED NOT DETECTED Final   Respiratory Syncytial Virus NOT DETECTED NOT DETECTED Final   Bordetella pertussis NOT DETECTED NOT DETECTED Final   Chlamydophila pneumoniae NOT DETECTED NOT DETECTED Final   Mycoplasma pneumoniae NOT DETECTED NOT DETECTED Final    Comment: Performed at [facility], [place] [address] [place], [place] [address]  MRSA PCR Screening     Status: None   Collection Time: 02/07/19  7:39 PM  Result Value Ref Range Status   MRSA by PCR NEGATIVE NEGATIVE Final    Comment:        The GeneXpert MRSA Assay (FDA approved for NASAL specimens only), is one component of a comprehensive MRSA colonization surveillance  program. It is not intended to diagnose MRSA infection nor to guide or monitor treatment for MRSA infections. Performed at [facility], [address], [place], [place] [address]      Time coordinating discharge: 45 minutes  SIGNED:   Tawni Millers, MD  [facility] 02/10/2019, 9:43 AM

## 2019-02-10 NOTE — Progress Notes (Signed)
Physical Therapy Treatment Patient Details Name: Emily Owen MRN: [id number] DOB: 11/24/1929 Today's Date: 02/10/2019    History of Present Illness Emily Owen is a 85 y.o. female with medical history significant for acute on chronic respiratory failure with hypoxia chronic 3 L O2 at home, COPD, hypertension, DM, who was brought to the ED via EMS with reports of change in mental status.  Patient refused sides with her daughter Emily Owen but has a second daughter called Emily Owen.  Per patient's daughter Emily Owen, last night patient was refusing to wear her home O2 refused to wear it for at least 7 hours..  She was also complaining of some chest pain but refused to come to the ED.  This morning patient was hanging of her recliner, and would not sit up straight.  Daughter was finally able to put O2 on patient later today.  Sats were not checked while patient was off O2. With decrease in patient's responsiveness, EMS was called.    PT Comments    Pt supine in bed and willing to participate.  Pt with min A for bed mobility, transfer and gait training, increased time to complete and required verbal cueing for handplacement to assist with reaching towards handrails and to push from bed to standings.  Pt with diarrhea while walking to restroom, RN aware and stated may be related to medication.  Pt requested to sit on BSC vs toilet due to higher surface, did require increased assistance for standing due to lower surface.  EOS pt left in chair with call bell within reach and chair alarm set.  RN aware of status.      Follow Up Recommendations  Home health PT;Supervision for mobility/OOB;Supervision - Intermittent     Equipment Recommendations  None recommended by PT    Recommendations for Other Services       Precautions / Restrictions Precautions Precautions: Fall    Mobility  Bed Mobility Overal bed mobility: Needs Assistance Bed Mobility: Supine to Sit     Supine to sit: Min assist      General bed mobility comments: increased time, labored breathing, required cueing for handrail reaching to assist  Transfers Overall transfer level: Needs assistance Equipment used: Rolling walker (2 wheeled)   Sit to Stand: Min assist;Min guard         General transfer comment: Cueing for hand placement to assist with sit to stand, able to complete with min guard from chair, required min A for standing from [facility] due to lower surface  Ambulation/Gait Ambulation/Gait assistance: Min guard Gait Distance (Feet): 55 Feet Assistive device: Straight cane Gait Pattern/deviations: Decreased step length - right;Decreased step length - left;Decreased stride length Gait velocity: decreased   General Gait Details: slightly labored slow cadence without loss of balance, limited mostly due to c/o fatigue   Stairs             Wheelchair Mobility    Modified Rankin (Stroke Patients Only)       Balance                                            Cognition Arousal/Alertness: Awake/alert Behavior During Therapy: WFL for tasks assessed/performed Overall Cognitive Status: Within Functional Limits for tasks assessed  Exercises      General Comments        Pertinent Vitals/Pain Pain Assessment: 0-10    Home Living                      Prior Function            PT Goals (current goals can now be found in the care plan section)      Frequency    Min 3X/week      PT Plan      Co-evaluation              AM-PAC PT "6 Clicks" Mobility   Outcome Measure  Help needed turning from your back to your side while in a flat bed without using bedrails?: None Help needed moving from lying on your back to sitting on the side of a flat bed without using bedrails?: A Little Help needed moving to and from a bed to a chair (including a wheelchair)?: A Little Help needed standing up from a  chair using your arms (e.g., wheelchair or bedside chair)?: A Little Help needed to walk in hospital room?: A Little Help needed climbing 3-5 steps with a railing? : A Little 6 Click Score: 19    End of Session Equipment Utilized During Treatment: Oxygen(3L O2 A via nasal canal) Activity Tolerance: Patient tolerated treatment well;Patient limited by fatigue Patient left: in chair;with call bell/phone within reach;with chair alarm set;with nursing/sitter in room(NT in room at EOS) Nurse Communication: Mobility status PT Visit Diagnosis: Unsteadiness on feet (R26.81);Other abnormalities of gait and mobility (R26.89);Muscle weakness (generalized) (M62.81)     Time: 2778-2423 PT Time Calculation (min) (ACUTE ONLY): 22 min  Charges:  $Gait Training: 8-22 mins $Therapeutic Activity: 8-22 mins                     [address], LPTA; [place]  Aldona Lento 02/10/2019, 12:29 PM

## 2019-02-11 DIAGNOSIS — J449 Chronic obstructive pulmonary disease, unspecified: Secondary | ICD-10-CM | POA: Diagnosis not present

## 2019-02-12 LAB — CULTURE, BLOOD (ROUTINE X 2)
Culture: NO GROWTH
Culture: NO GROWTH

## 2019-02-13 DIAGNOSIS — L89321 Pressure ulcer of left buttock, stage 1: Secondary | ICD-10-CM | POA: Diagnosis not present

## 2019-02-13 DIAGNOSIS — I700 Atherosclerosis of aorta: Secondary | ICD-10-CM | POA: Diagnosis not present

## 2019-02-13 DIAGNOSIS — I10 Essential (primary) hypertension: Secondary | ICD-10-CM | POA: Diagnosis not present

## 2019-02-13 DIAGNOSIS — I7300 Raynaud's syndrome without gangrene: Secondary | ICD-10-CM | POA: Diagnosis not present

## 2019-02-13 DIAGNOSIS — J9612 Chronic respiratory failure with hypercapnia: Secondary | ICD-10-CM | POA: Diagnosis not present

## 2019-02-13 DIAGNOSIS — J441 Chronic obstructive pulmonary disease with (acute) exacerbation: Secondary | ICD-10-CM | POA: Diagnosis not present

## 2019-02-13 DIAGNOSIS — E785 Hyperlipidemia, unspecified: Secondary | ICD-10-CM | POA: Diagnosis not present

## 2019-02-13 DIAGNOSIS — J90 Pleural effusion, not elsewhere classified: Secondary | ICD-10-CM | POA: Diagnosis not present

## 2019-02-13 DIAGNOSIS — M109 Gout, unspecified: Secondary | ICD-10-CM | POA: Diagnosis not present

## 2019-02-13 DIAGNOSIS — R5381 Other malaise: Secondary | ICD-10-CM | POA: Diagnosis not present

## 2019-02-19 DIAGNOSIS — M109 Gout, unspecified: Secondary | ICD-10-CM | POA: Diagnosis not present

## 2019-02-19 DIAGNOSIS — R5381 Other malaise: Secondary | ICD-10-CM | POA: Diagnosis not present

## 2019-02-19 DIAGNOSIS — J9612 Chronic respiratory failure with hypercapnia: Secondary | ICD-10-CM | POA: Diagnosis not present

## 2019-02-19 DIAGNOSIS — E785 Hyperlipidemia, unspecified: Secondary | ICD-10-CM | POA: Diagnosis not present

## 2019-02-19 DIAGNOSIS — J90 Pleural effusion, not elsewhere classified: Secondary | ICD-10-CM | POA: Diagnosis not present

## 2019-02-19 DIAGNOSIS — I700 Atherosclerosis of aorta: Secondary | ICD-10-CM | POA: Diagnosis not present

## 2019-02-19 DIAGNOSIS — I7300 Raynaud's syndrome without gangrene: Secondary | ICD-10-CM | POA: Diagnosis not present

## 2019-02-19 DIAGNOSIS — J441 Chronic obstructive pulmonary disease with (acute) exacerbation: Secondary | ICD-10-CM | POA: Diagnosis not present

## 2019-02-19 DIAGNOSIS — I10 Essential (primary) hypertension: Secondary | ICD-10-CM | POA: Diagnosis not present

## 2019-02-19 DIAGNOSIS — L89321 Pressure ulcer of left buttock, stage 1: Secondary | ICD-10-CM | POA: Diagnosis not present

## 2019-02-20 DIAGNOSIS — I7300 Raynaud's syndrome without gangrene: Secondary | ICD-10-CM | POA: Diagnosis not present

## 2019-02-20 DIAGNOSIS — R5381 Other malaise: Secondary | ICD-10-CM | POA: Diagnosis not present

## 2019-02-20 DIAGNOSIS — L89321 Pressure ulcer of left buttock, stage 1: Secondary | ICD-10-CM | POA: Diagnosis not present

## 2019-02-20 DIAGNOSIS — I10 Essential (primary) hypertension: Secondary | ICD-10-CM | POA: Diagnosis not present

## 2019-02-20 DIAGNOSIS — J9612 Chronic respiratory failure with hypercapnia: Secondary | ICD-10-CM | POA: Diagnosis not present

## 2019-02-20 DIAGNOSIS — M109 Gout, unspecified: Secondary | ICD-10-CM | POA: Diagnosis not present

## 2019-02-20 DIAGNOSIS — I700 Atherosclerosis of aorta: Secondary | ICD-10-CM | POA: Diagnosis not present

## 2019-02-20 DIAGNOSIS — E785 Hyperlipidemia, unspecified: Secondary | ICD-10-CM | POA: Diagnosis not present

## 2019-02-20 DIAGNOSIS — J441 Chronic obstructive pulmonary disease with (acute) exacerbation: Secondary | ICD-10-CM | POA: Diagnosis not present

## 2019-02-20 DIAGNOSIS — J90 Pleural effusion, not elsewhere classified: Secondary | ICD-10-CM | POA: Diagnosis not present

## 2019-02-21 DIAGNOSIS — J449 Chronic obstructive pulmonary disease, unspecified: Secondary | ICD-10-CM | POA: Diagnosis not present

## 2019-02-23 DIAGNOSIS — J449 Chronic obstructive pulmonary disease, unspecified: Secondary | ICD-10-CM | POA: Diagnosis not present

## 2019-02-24 DIAGNOSIS — I700 Atherosclerosis of aorta: Secondary | ICD-10-CM | POA: Diagnosis not present

## 2019-02-24 DIAGNOSIS — I10 Essential (primary) hypertension: Secondary | ICD-10-CM | POA: Diagnosis not present

## 2019-02-24 DIAGNOSIS — R5381 Other malaise: Secondary | ICD-10-CM | POA: Diagnosis not present

## 2019-02-24 DIAGNOSIS — I7300 Raynaud's syndrome without gangrene: Secondary | ICD-10-CM | POA: Diagnosis not present

## 2019-02-24 DIAGNOSIS — E785 Hyperlipidemia, unspecified: Secondary | ICD-10-CM | POA: Diagnosis not present

## 2019-02-24 DIAGNOSIS — J441 Chronic obstructive pulmonary disease with (acute) exacerbation: Secondary | ICD-10-CM | POA: Diagnosis not present

## 2019-02-24 DIAGNOSIS — J90 Pleural effusion, not elsewhere classified: Secondary | ICD-10-CM | POA: Diagnosis not present

## 2019-02-24 DIAGNOSIS — L89321 Pressure ulcer of left buttock, stage 1: Secondary | ICD-10-CM | POA: Diagnosis not present

## 2019-02-24 DIAGNOSIS — M109 Gout, unspecified: Secondary | ICD-10-CM | POA: Diagnosis not present

## 2019-02-24 DIAGNOSIS — J9612 Chronic respiratory failure with hypercapnia: Secondary | ICD-10-CM | POA: Diagnosis not present

## 2019-02-25 DIAGNOSIS — E785 Hyperlipidemia, unspecified: Secondary | ICD-10-CM | POA: Diagnosis not present

## 2019-02-25 DIAGNOSIS — I10 Essential (primary) hypertension: Secondary | ICD-10-CM | POA: Diagnosis not present

## 2019-02-25 DIAGNOSIS — I7300 Raynaud's syndrome without gangrene: Secondary | ICD-10-CM | POA: Diagnosis not present

## 2019-02-25 DIAGNOSIS — L89321 Pressure ulcer of left buttock, stage 1: Secondary | ICD-10-CM | POA: Diagnosis not present

## 2019-02-25 DIAGNOSIS — M109 Gout, unspecified: Secondary | ICD-10-CM | POA: Diagnosis not present

## 2019-02-25 DIAGNOSIS — J441 Chronic obstructive pulmonary disease with (acute) exacerbation: Secondary | ICD-10-CM | POA: Diagnosis not present

## 2019-02-25 DIAGNOSIS — J9612 Chronic respiratory failure with hypercapnia: Secondary | ICD-10-CM | POA: Diagnosis not present

## 2019-02-25 DIAGNOSIS — R5381 Other malaise: Secondary | ICD-10-CM | POA: Diagnosis not present

## 2019-02-25 DIAGNOSIS — J90 Pleural effusion, not elsewhere classified: Secondary | ICD-10-CM | POA: Diagnosis not present

## 2019-02-25 DIAGNOSIS — I700 Atherosclerosis of aorta: Secondary | ICD-10-CM | POA: Diagnosis not present

## 2019-02-26 DIAGNOSIS — I700 Atherosclerosis of aorta: Secondary | ICD-10-CM | POA: Diagnosis not present

## 2019-02-26 DIAGNOSIS — J90 Pleural effusion, not elsewhere classified: Secondary | ICD-10-CM | POA: Diagnosis not present

## 2019-02-26 DIAGNOSIS — I7300 Raynaud's syndrome without gangrene: Secondary | ICD-10-CM | POA: Diagnosis not present

## 2019-02-26 DIAGNOSIS — J9612 Chronic respiratory failure with hypercapnia: Secondary | ICD-10-CM | POA: Diagnosis not present

## 2019-02-26 DIAGNOSIS — L89321 Pressure ulcer of left buttock, stage 1: Secondary | ICD-10-CM | POA: Diagnosis not present

## 2019-02-26 DIAGNOSIS — J441 Chronic obstructive pulmonary disease with (acute) exacerbation: Secondary | ICD-10-CM | POA: Diagnosis not present

## 2019-02-26 DIAGNOSIS — I10 Essential (primary) hypertension: Secondary | ICD-10-CM | POA: Diagnosis not present

## 2019-02-26 DIAGNOSIS — R5381 Other malaise: Secondary | ICD-10-CM | POA: Diagnosis not present

## 2019-02-26 DIAGNOSIS — E785 Hyperlipidemia, unspecified: Secondary | ICD-10-CM | POA: Diagnosis not present

## 2019-02-26 DIAGNOSIS — M109 Gout, unspecified: Secondary | ICD-10-CM | POA: Diagnosis not present

## 2019-03-05 DIAGNOSIS — R5381 Other malaise: Secondary | ICD-10-CM | POA: Diagnosis not present

## 2019-03-05 DIAGNOSIS — E785 Hyperlipidemia, unspecified: Secondary | ICD-10-CM | POA: Diagnosis not present

## 2019-03-05 DIAGNOSIS — I7300 Raynaud's syndrome without gangrene: Secondary | ICD-10-CM | POA: Diagnosis not present

## 2019-03-05 DIAGNOSIS — I700 Atherosclerosis of aorta: Secondary | ICD-10-CM | POA: Diagnosis not present

## 2019-03-05 DIAGNOSIS — J441 Chronic obstructive pulmonary disease with (acute) exacerbation: Secondary | ICD-10-CM | POA: Diagnosis not present

## 2019-03-05 DIAGNOSIS — M109 Gout, unspecified: Secondary | ICD-10-CM | POA: Diagnosis not present

## 2019-03-05 DIAGNOSIS — I10 Essential (primary) hypertension: Secondary | ICD-10-CM | POA: Diagnosis not present

## 2019-03-05 DIAGNOSIS — L89321 Pressure ulcer of left buttock, stage 1: Secondary | ICD-10-CM | POA: Diagnosis not present

## 2019-03-05 DIAGNOSIS — J9612 Chronic respiratory failure with hypercapnia: Secondary | ICD-10-CM | POA: Diagnosis not present

## 2019-03-05 DIAGNOSIS — J90 Pleural effusion, not elsewhere classified: Secondary | ICD-10-CM | POA: Diagnosis not present

## 2019-03-06 ENCOUNTER — Encounter: Payer: Self-pay | Admitting: Family Medicine

## 2019-03-06 DIAGNOSIS — I10 Essential (primary) hypertension: Secondary | ICD-10-CM | POA: Diagnosis not present

## 2019-03-06 DIAGNOSIS — J9612 Chronic respiratory failure with hypercapnia: Secondary | ICD-10-CM | POA: Diagnosis not present

## 2019-03-06 DIAGNOSIS — L89321 Pressure ulcer of left buttock, stage 1: Secondary | ICD-10-CM | POA: Diagnosis not present

## 2019-03-06 DIAGNOSIS — E785 Hyperlipidemia, unspecified: Secondary | ICD-10-CM | POA: Diagnosis not present

## 2019-03-06 DIAGNOSIS — I700 Atherosclerosis of aorta: Secondary | ICD-10-CM | POA: Diagnosis not present

## 2019-03-06 DIAGNOSIS — R5381 Other malaise: Secondary | ICD-10-CM | POA: Diagnosis not present

## 2019-03-06 DIAGNOSIS — I7300 Raynaud's syndrome without gangrene: Secondary | ICD-10-CM | POA: Diagnosis not present

## 2019-03-06 DIAGNOSIS — J441 Chronic obstructive pulmonary disease with (acute) exacerbation: Secondary | ICD-10-CM | POA: Diagnosis not present

## 2019-03-06 DIAGNOSIS — M109 Gout, unspecified: Secondary | ICD-10-CM | POA: Diagnosis not present

## 2019-03-06 DIAGNOSIS — J90 Pleural effusion, not elsewhere classified: Secondary | ICD-10-CM | POA: Diagnosis not present

## 2019-03-13 DIAGNOSIS — J449 Chronic obstructive pulmonary disease, unspecified: Secondary | ICD-10-CM | POA: Diagnosis not present

## 2019-03-20 ENCOUNTER — Other Ambulatory Visit: Payer: Self-pay

## 2019-03-20 ENCOUNTER — Ambulatory Visit (INDEPENDENT_AMBULATORY_CARE_PROVIDER_SITE_OTHER): Payer: Medicare HMO | Admitting: Family Medicine

## 2019-03-20 DIAGNOSIS — J441 Chronic obstructive pulmonary disease with (acute) exacerbation: Secondary | ICD-10-CM | POA: Diagnosis not present

## 2019-03-20 MED ORDER — PREDNISONE 20 MG PO TABS: ORAL_TABLET | ORAL | 0 refills | Status: DC

## 2019-03-20 MED ORDER — AMOXICILLIN-POT CLAVULANATE ER 1000-62.5 MG PO TB12: 2.0000 | ORAL_TABLET | Freq: Two times a day (BID) | ORAL | 0 refills | Status: DC

## 2019-03-20 NOTE — Progress Notes (Signed)
Subjective:    Patient ID: Emily Owen, female    DOB: [birth year], 85 y.o.   MRN: [id number]  HPI  Today's office visit is being conducted as a phone visit.  The patient is asleep in bed and her daughter talks to me over the telephone and her place.  Phone call began at 1020.  Phone call concluded at 1035.  Patient's daughter consents to have a visit performed over the telephone. Patient is an 85 year old Caucasian female with oxygen dependent chronic respiratory failure secondary to COPD.  She was recently in the hospital April 18 for a COPD exacerbation and RLL CAP.  She essentially now stays at home lying in bed all day long.  She is on 3 L via nasal cannula chronically.  Her daughter states that she stands up uses the potty chair and then will lay back down in her bed.  She only walks twice a week when she walks to the bathroom to take a bath.  She moves very little.  She has to sleep sitting up due to chronic shortness of breath however her daughter denies any swelling in her legs to suggest pulmonary edema.  The patient has had a chronic cough over the last 3 weeks however over the last 3 weeks his cough is steadily worsened.  It is now productive of yellow and brownish sputum.  The patient reports increasing shortness of breath.  She denies any chest pain.  She denies any pleurisy.  They were checking her temperature and her daughter denies any fever.  They have tried giving her Combivent the patient experienced no benefit with the cough after taking Combivent.  They are listening to her lungs and they here no change as far as her wheezing.  They are concerned that she may be developing pneumonia.  At my last visit there was some concern about aspiration pneumonia.  She had a modified barium swallow performed on April 20 which revealed mild aspiration risk and a dysphasia 3 diet was recommended Past Medical History:  Diagnosis Date  . Cancer ([place])    colon  . Cancer of cervix ([place])    cervix  . COPD (chronic obstructive pulmonary disease) ([place])   . Diabetes mellitus without complication ([place])   . Gallstones   . Gout   . Hard of hearing   . On home O2   . Osteoporosis   . Pleural effusion   . Raynaud's disease    Past Surgical History:  Procedure Laterality Date  . ABDOMINAL HYSTERECTOMY     for cervical cancer 1965  . COLON SURGERY     hemicolectomy 1998  . gallstone surgery     ERCP for gallstone pancreatitis  . ORTHOPEDIC SURGERY     Current Outpatient Medications on File Prior to Visit  Medication Sig Dispense Refill  . alendronate (FOSAMAX) 70 MG tablet TAKE 1 TABLET BY MOUTH EVERY 7 DAYS. TAKE WITH A FULL GLASS OF WATER ON AN EMPTY STOMACH. 12 tablet 3  . amLODipine (NORVASC) 5 MG tablet Take 1 tablet (5 mg total) by mouth daily. 90 tablet 3  . ANORO ELLIPTA 62.5-25 MCG/INH AEPB TAKE 1 PUFF BY MOUTH EVERY DAY 60 each 5  . Balsam Peru-Castor Oil (VENELEX) OINT Apply to bilateral buttocks and sacrum qshift and prn for shear to buttocks and prevention. Every shift    . colchicine 0.6 MG tablet Take 0.6 mg by mouth 2 (two) times daily as needed.    . gabapentin (NEURONTIN) 100  MG capsule Take 200 mg by mouth at bedtime.    . Ipratropium-Albuterol (COMBIVENT RESPIMAT) 20-100 MCG/ACT AERS respimat Inhale 1 puff into the lungs every 4 (four) hours as needed for wheezing.    . OXYGEN Inhale 3 L into the lungs continuous. [employer]    . Respiratory Therapy Supplies (FLUTTER) DEVI Use as directed. 1 each 0  . rosuvastatin (CRESTOR) 20 MG tablet Take 1 tablet (20 mg total) by mouth daily. 90 tablet 3   No current facility-administered medications on file prior to visit.    No Known Allergies Social History   Socioeconomic History  . Marital status: Widowed    Spouse name: Not on file  . Number of children: Not on file  . Years of education: Not on file  . Highest education level: Not on file  Occupational History  . Not on file  Social Needs  . Financial  resource strain: Not on file  . Food insecurity:    Worry: Not on file    Inability: Not on file  . Transportation needs:    Medical: Not on file    Non-medical: Not on file  Tobacco Use  . Smoking status: Former Smoker    Packs/day: 2.00    Years: 35.00    Pack years: 70.00    Types: Cigarettes    Last attempt to quit: 08/11/1994    Years since quitting: 24.6  . Smokeless tobacco: Never Used  Substance and Sexual Activity  . Alcohol use: No    Alcohol/week: 0.0 standard drinks  . Drug use: No  . Sexual activity: Not on file  Lifestyle  . Physical activity:    Days per week: Not on file    Minutes per session: Not on file  . Stress: Not on file  Relationships  . Social connections:    Talks on phone: Not on file    Gets together: Not on file    Attends religious service: Not on file    Active member of club or organization: Not on file    Attends meetings of clubs or organizations: Not on file    Relationship status: Not on file  . Intimate partner violence:    Fear of current or ex partner: Not on file    Emotionally abused: Not on file    Physically abused: Not on file    Forced sexual activity: Not on file  Other Topics Concern  . Not on file  Social History Narrative  . Not on file      Review of Systems  All other systems reviewed and are negative.      Objective:   Physical Exam  Physical exam could not be performed today as the patient was seen over the telephone.      Assessment & Plan:  COPD exacerbation ([place])  I explained to the patient's daughter that is difficult to ascertain the cause of her cough over the telephone.  However the situation is understandable.  The patient is nearing the end of her life and has chronic respiratory failure secondary to COPD oxygen dependent.  Is very difficult to even get her out of bed to use the restroom.  They would like to try empiric therapy for possible community-acquired pneumonia given her presentation  is similar to what happened in April.  Therefore I will start the patient on Augmentin extended release tablets, 1000 mg, 2 tablets twice daily for 10 days.  I suspect there is also likely an element of  a COPD exacerbation so I will start the patient on a prednisone taper pack for the next 6 days and then will need to monitor the patient closely for any improvement or deterioration.  Also on the differential diagnosis includes pulmonary edema given her subjective orthopnea however her daughter denies any leg swelling.  Also on the differential diagnosis would be pulmonary embolism given her immobility however the patient denies any chest pain or pleurisy or hemoptysis or leg swelling.  Therefore we will try a treatment for COPD exacerbation and community-acquired pneumonia first and see if the patient responds.  If not or if she worsens she is to seek medical attention immediately

## 2019-03-24 DIAGNOSIS — J449 Chronic obstructive pulmonary disease, unspecified: Secondary | ICD-10-CM | POA: Diagnosis not present

## 2019-03-26 DIAGNOSIS — J449 Chronic obstructive pulmonary disease, unspecified: Secondary | ICD-10-CM | POA: Diagnosis not present

## 2019-04-13 DIAGNOSIS — J449 Chronic obstructive pulmonary disease, unspecified: Secondary | ICD-10-CM | POA: Diagnosis not present

## 2019-04-23 DIAGNOSIS — J449 Chronic obstructive pulmonary disease, unspecified: Secondary | ICD-10-CM | POA: Diagnosis not present

## 2019-04-25 DIAGNOSIS — J449 Chronic obstructive pulmonary disease, unspecified: Secondary | ICD-10-CM | POA: Diagnosis not present

## 2019-05-11 ENCOUNTER — Encounter: Payer: Self-pay | Admitting: Family Medicine

## 2019-05-12 MED ORDER — COMBIVENT RESPIMAT 20-100 MCG/ACT IN AERS: 1.0000 | INHALATION_SPRAY | RESPIRATORY_TRACT | 11 refills | Status: DC | PRN

## 2019-05-13 DIAGNOSIS — J449 Chronic obstructive pulmonary disease, unspecified: Secondary | ICD-10-CM | POA: Diagnosis not present

## 2019-05-24 DIAGNOSIS — J449 Chronic obstructive pulmonary disease, unspecified: Secondary | ICD-10-CM | POA: Diagnosis not present

## 2019-05-26 DIAGNOSIS — J449 Chronic obstructive pulmonary disease, unspecified: Secondary | ICD-10-CM | POA: Diagnosis not present

## 2019-06-03 ENCOUNTER — Other Ambulatory Visit: Payer: Self-pay

## 2019-06-03 ENCOUNTER — Emergency Department (HOSPITAL_COMMUNITY)
Admission: EM | Admit: 2019-06-03 | Discharge: 2019-06-03 | Disposition: A | Discharge status: Home / Self Care | Payer: Medicare HMO | Attending: Emergency Medicine | Admitting: Emergency Medicine

## 2019-06-03 DIAGNOSIS — J449 Chronic obstructive pulmonary disease, unspecified: Secondary | ICD-10-CM | POA: Insufficient documentation

## 2019-06-03 DIAGNOSIS — Z87891 Personal history of nicotine dependence: Secondary | ICD-10-CM | POA: Diagnosis not present

## 2019-06-03 DIAGNOSIS — J9611 Chronic respiratory failure with hypoxia: Secondary | ICD-10-CM | POA: Insufficient documentation

## 2019-06-03 DIAGNOSIS — I10 Essential (primary) hypertension: Secondary | ICD-10-CM | POA: Insufficient documentation

## 2019-06-03 DIAGNOSIS — E1151 Type 2 diabetes mellitus with diabetic peripheral angiopathy without gangrene: Secondary | ICD-10-CM | POA: Diagnosis not present

## 2019-06-03 DIAGNOSIS — Z85038 Personal history of other malignant neoplasm of large intestine: Secondary | ICD-10-CM | POA: Insufficient documentation

## 2019-06-03 DIAGNOSIS — Z209 Contact with and (suspected) exposure to unspecified communicable disease: Secondary | ICD-10-CM | POA: Diagnosis not present

## 2019-06-03 DIAGNOSIS — R110 Nausea: Secondary | ICD-10-CM | POA: Diagnosis not present

## 2019-06-03 DIAGNOSIS — R5381 Other malaise: Secondary | ICD-10-CM | POA: Diagnosis not present

## 2019-06-03 DIAGNOSIS — Z79899 Other long term (current) drug therapy: Secondary | ICD-10-CM | POA: Insufficient documentation

## 2019-06-03 DIAGNOSIS — J9612 Chronic respiratory failure with hypercapnia: Secondary | ICD-10-CM | POA: Insufficient documentation

## 2019-06-03 DIAGNOSIS — Z9981 Dependence on supplemental oxygen: Secondary | ICD-10-CM | POA: Diagnosis not present

## 2019-06-03 LAB — CBC WITH DIFFERENTIAL/PLATELET
Abs Immature Granulocytes: 0.03 10*3/uL (ref 0.00–0.07)
Basophils Absolute: 0 10*3/uL (ref 0.0–0.1)
Basophils Relative: 0 %
Eosinophils Absolute: 0.3 10*3/uL (ref 0.0–0.5)
Eosinophils Relative: 4 %
HCT: 35.5 % — ABNORMAL LOW (ref 36.0–46.0)
Hemoglobin: 10.2 g/dL — ABNORMAL LOW (ref 12.0–15.0)
Immature Granulocytes: 0 %
Lymphocytes Relative: 18 %
Lymphs Abs: 1.3 10*3/uL (ref 0.7–4.0)
MCH: 27.1 pg (ref 26.0–34.0)
MCHC: 28.7 g/dL — ABNORMAL LOW (ref 30.0–36.0)
MCV: 94.2 fL (ref 80.0–100.0)
Monocytes Absolute: 0.6 10*3/uL (ref 0.1–1.0)
Monocytes Relative: 8 %
Neutro Abs: 5.2 10*3/uL (ref 1.7–7.7)
Neutrophils Relative %: 70 %
Platelets: 210 10*3/uL (ref 150–400)
RBC: 3.77 MIL/uL — ABNORMAL LOW (ref 3.87–5.11)
RDW: 17.4 % — ABNORMAL HIGH (ref 11.5–15.5)
WBC: 7.4 10*3/uL (ref 4.0–10.5)
nRBC: 0 % (ref 0.0–0.2)

## 2019-06-03 LAB — URINALYSIS, ROUTINE W REFLEX MICROSCOPIC
Bilirubin Urine: NEGATIVE
Glucose, UA: NEGATIVE mg/dL
Hgb urine dipstick: NEGATIVE
Ketones, ur: NEGATIVE mg/dL
Nitrite: NEGATIVE
Protein, ur: NEGATIVE mg/dL
Specific Gravity, Urine: 1.012 (ref 1.005–1.030)
pH: 6 (ref 5.0–8.0)

## 2019-06-03 LAB — COMPREHENSIVE METABOLIC PANEL
ALT: 10 U/L (ref 0–44)
AST: 17 U/L (ref 15–41)
Albumin: 3.3 g/dL — ABNORMAL LOW (ref 3.5–5.0)
Alkaline Phosphatase: 53 U/L (ref 38–126)
Anion gap: 9 (ref 5–15)
BUN: 16 mg/dL (ref 8–23)
CO2: 35 mmol/L — ABNORMAL HIGH (ref 22–32)
Calcium: 8.9 mg/dL (ref 8.9–10.3)
Chloride: 94 mmol/L — ABNORMAL LOW (ref 98–111)
Creatinine, Ser: 0.92 mg/dL (ref 0.44–1.00)
GFR calc Af Amer: >60 mL/min (ref >60)
GFR calc non Af Amer: 56 mL/min — ABNORMAL LOW (ref >60)
Glucose, Bld: 124 mg/dL — ABNORMAL HIGH (ref 70–99)
Potassium: 4.5 mmol/L (ref 3.5–5.1)
Sodium: 138 mmol/L (ref 135–145)
Total Bilirubin: 0.6 mg/dL (ref 0.3–1.2)
Total Protein: 6.3 g/dL — ABNORMAL LOW (ref 6.5–8.1)

## 2019-06-03 MED ORDER — ONDANSETRON HCL 4 MG/2ML IJ SOLN: 4.0000 mg | Freq: Once | INTRAMUSCULAR | Status: DC

## 2019-06-03 MED ORDER — ONDANSETRON 4 MG PO TBDP: 4.0000 mg | ORAL_TABLET | Freq: Three times a day (TID) | ORAL | 0 refills | Status: AC | PRN

## 2019-06-03 MED ORDER — ONDANSETRON 4 MG PO TBDP
4.0000 mg | ORAL_TABLET | Freq: Once | ORAL | Status: AC
  Administered 2019-06-03: 16:00:00 4 mg via ORAL
  Filled 2019-06-03: qty 1

## 2019-06-03 NOTE — Discharge Instructions (Addendum)
Please follow-up closely with your primary care provider regarding your visit today. You can take the Zofran every 8 hours as needed for nausea.  It is important to stay hydrated with this medication, as the potential to make you constipated. If you develop fever, uncontrollable vomiting, or new or concerning symptoms, please return to the emergency department.

## 2019-06-03 NOTE — ED Provider Notes (Signed)
[facility] EMERGENCY DEPARTMENT Provider Note   CSN: [id number] Arrival date & time: 06/03/19  1230    History   Chief Complaint Chief Complaint  Patient presents with  . Nausea    HPI Emily Owen is a 85 y.o. female w PMHx colon cancer, COPD on 3L chronic O2, DM, HTN, presenting to the ED with complaint of intermittent nausea for the last week. She also has some vague symptoms of not feeling well.  She has had no changes in BM or urinary sx. No fevers. No new cough or SOB. No sick contacts. No interventions tried PTA. Has not contacted her PCP.      The history is provided by the patient.    Past Medical History:  Diagnosis Date  . Cancer ([place])    colon  . Cancer of cervix ([place])    cervix  . COPD (chronic obstructive pulmonary disease) ([place])   . Diabetes mellitus without complication ([place])   . Gallstones   . Gout   . Hard of hearing   . On home O2   . Osteoporosis   . Pleural effusion   . Raynaud's disease     Patient Active Problem List   Diagnosis Date Noted  . Aspiration into airway 02/09/2019  . Chronic obstructive pulmonary disease with acute exacerbation ([place])   . Acute on chronic respiratory failure with hypoxia and hypercapnia ([facility])   . Palliative care by specialist   . Advanced care planning/counseling discussion   . Goals of care, counseling/discussion   . AMS (altered mental status) 02/07/2019  . DNR (do not resuscitate) 01/27/2019  . Pressure injury of skin 01/15/2019  . Severe sepsis ([place]) 01/15/2019  . RLL pneumonia ([place]) 01/15/2019  . Generalized weakness 01/15/2019  . Left upper lobe pulmonary nodule 01/15/2019  . Raynaud's disease   . Pancreatitis 02/13/2018  . Routine general medical examination at a health care facility 07/19/2017  . Gout 03/14/2016  . Atherosclerosis of aorta ([place]) 06/11/2015  . Diabetes mellitus with peripheral vascular disease ([place])   . Chronic respiratory failure with hypercapnia ([place]) 06/02/2015  . COPD,  severe ([place]) 03/25/2015  . Pleural effusion on right 03/25/2015  . COPD with acute exacerbation ([place]) 08/11/2014  . HTN (hypertension) 08/11/2014  . Acute on chronic respiratory failure with hypoxia ([place]) 08/22/2012  . Hyperlipidemia 08/20/2012    Past Surgical History:  Procedure Laterality Date  . ABDOMINAL HYSTERECTOMY     for cervical cancer 1965  . COLON SURGERY     hemicolectomy 1998  . gallstone surgery     ERCP for gallstone pancreatitis  . ORTHOPEDIC SURGERY       OB History    Gravida  6   Para  6   Term  6   Preterm      AB      Living  5     SAB      TAB      Ectopic      Multiple      Live Births               Home Medications    Prior to Admission medications   Medication Sig Start Date End Date Taking? Authorizing Provider  alendronate (FOSAMAX) 70 MG tablet TAKE 1 TABLET BY MOUTH EVERY 7 DAYS. TAKE WITH A FULL GLASS OF WATER ON AN EMPTY STOMACH. 11/13/18   [name], MD  amLODipine (NORVASC) 5 MG tablet Take 1 tablet (5 mg total) by  mouth daily. 03/05/18   [name], MD  amoxicillin-clavulanate (AUGMENTIN XR) 1000-62.5 MG 12 hr tablet Take 2 tablets by mouth 2 (two) times daily. 03/20/19   [name], MD  ANORO ELLIPTA 62.5-25 MCG/INH AEPB TAKE 1 PUFF BY MOUTH EVERY DAY 12/09/18   Juanito Doom, MD  Balsam [place]-Castor Oil [facility]) OINT Apply to bilateral buttocks and sacrum qshift and prn for shear to buttocks and prevention. Every shift    [provider]  colchicine 0.6 MG tablet Take 0.6 mg by mouth 2 (two) times daily as needed. 01/17/19   [provider]  gabapentin (NEURONTIN) 100 MG capsule Take 200 mg by mouth at bedtime.    [provider]  Ipratropium-Albuterol (COMBIVENT RESPIMAT) 20-100 MCG/ACT AERS respimat Inhale 1 puff into the lungs every 4 (four) hours as needed for wheezing. 05/12/19   [name], MD  OXYGEN Inhale 3 L into the lungs continuous. [employer]    [provider]  predniSONE (DELTASONE) 20 MG tablet 3 tabs poqday 1-2, 2 tabs poqday 3-4, 1 tab poqday 5-6 03/20/19   [name], MD  Respiratory Therapy Supplies (FLUTTER) DEVI Use as directed. 10/01/18   Parrett, Fonnie Mu, NP  rosuvastatin (CRESTOR) 20 MG tablet Take 1 tablet (20 mg total) by mouth daily. 02/27/18   [name], MD    Family History Family History  Problem Relation Age of Onset  . Diabetes Sister   . Diabetes Sister   . Diabetes Sister   . Hypertension Mother   . Heart disease Mother   . Diabetes Sister     Social History Social History   Tobacco Use  . Smoking status: Former Smoker    Packs/day: 2.00    Years: 35.00    Pack years: 70.00    Types: Cigarettes    Quit date: 08/11/1994    Years since quitting: 24.8  . Smokeless tobacco: Never Used  Substance Use Topics  . Alcohol use: No    Alcohol/week: 0.0 standard drinks  . Drug use: No     Allergies   Patient has no known allergies.   Review of Systems Review of Systems  Constitutional: Negative for fever.  Respiratory: Negative for cough and shortness of breath.   Gastrointestinal: Positive for nausea. Negative for abdominal pain, constipation and vomiting.  Genitourinary: Negative for dysuria, flank pain and frequency.  All other systems reviewed and are negative.    Physical Exam Updated Vital Signs BP (!) 141/80 (BP Location: Right Arm)   Pulse 70   Temp 97.9 F (36.6 C) (Oral)   Resp 16   Wt 77.1 kg   SpO2 96%   BMI 29.18 kg/m   Physical Exam Vitals signs and nursing note reviewed.  Constitutional:      General: She is not in acute distress.    Appearance: She is well-developed.  HENT:     Head: Normocephalic and atraumatic.  Eyes:     Conjunctiva/sclera: Conjunctivae normal.  Cardiovascular:     Rate and Rhythm: Normal rate and regular rhythm.     Pulses: Normal pulses.  Pulmonary:     Effort: Pulmonary effort is normal. No respiratory distress.      Comments: Diminished, faint wheezes present.  Abdominal:     General: Bowel sounds are normal.     Palpations: Abdomen is soft.     Tenderness: There is no abdominal tenderness. There is no guarding or rebound.  Musculoskeletal:  Right lower leg: No edema.     Left lower leg: No edema.  Skin:    General: Skin is warm.  Neurological:     Mental Status: She is alert.  Psychiatric:        Behavior: Behavior normal.      ED Treatments / Results  Labs (all labs ordered are listed, but only abnormal results are displayed) Labs Reviewed  COMPREHENSIVE METABOLIC PANEL - Abnormal; Notable for the following components:      Result Value   Chloride 94 (*)    CO2 35 (*)    Glucose, Bld 124 (*)    Total Protein 6.3 (*)    Albumin 3.3 (*)    GFR calc non Af Amer 56 (*)    All other components within normal limits  CBC WITH DIFFERENTIAL/PLATELET - Abnormal; Notable for the following components:   RBC 3.77 (*)    Hemoglobin 10.2 (*)    HCT 35.5 (*)    MCHC 28.7 (*)    RDW 17.4 (*)    All other components within normal limits  URINALYSIS, ROUTINE W REFLEX MICROSCOPIC - Abnormal; Notable for the following components:   Leukocytes,Ua SMALL (*)    Bacteria, UA RARE (*)    All other components within normal limits  URINE CULTURE    EKG None  Radiology No results found.  Procedures Procedures (including critical care time)  Medications Ordered in ED Medications  ondansetron (ZOFRAN-ODT) disintegrating tablet 4 mg (4 mg Oral Given 06/03/19 1615)     Initial Impression / Assessment and Plan / ED Course  I have reviewed the triage vital signs and the nursing notes.  Pertinent labs & imaging results that were available during my care of the patient were reviewed by me and considered in my medical decision making (see chart for details).        Patient presenting with vague symptoms of not feeling well for the last week with intermittent nausea, no vomiting.  She is  breathing at her baseline and has had no sick contacts.  Vital signs are stable.  She is afebrile.  Labs without leukocytosis, hemoglobin is improved from previous.  Urine is not consistent with infection, will culture. Nausea improved with zofran. At this time, there is no evidence of emergent or life-threatening pathology.  Patient can be discharged with outpatient follow-up and symptomatic management of symptoms. Strict return precautions.  Patient discussed with and evaluated by Dr. Roderic Palau, who agrees with care plan.  Final Clinical Impressions(s) / ED Diagnoses   Final diagnoses:  Nausea    ED Discharge Orders    None       Birney Belshe, [place] N, PA-C 06/03/19 Barth Kirks, MD 06/09/19 1309

## 2019-06-03 NOTE — ED Triage Notes (Addendum)
Patient complaining of nausea x2 days. Denies fever, vomiting, new diarrhea sx. (States she has diarrhea chronically)  Hx of COPD, pt reports she has a chronic cough related to this. 3 L O2 chronically.

## 2019-06-03 NOTE — ED Notes (Signed)
Patient sacrum is red from pt sitting down for prolonged amount of time. No skin breakdown noted.

## 2019-06-04 ENCOUNTER — Encounter: Payer: Self-pay | Admitting: Family Medicine

## 2019-06-04 LAB — URINE CULTURE

## 2019-06-09 ENCOUNTER — Encounter: Payer: Self-pay | Admitting: Family Medicine

## 2019-06-09 ENCOUNTER — Ambulatory Visit (INDEPENDENT_AMBULATORY_CARE_PROVIDER_SITE_OTHER): Payer: Medicare HMO | Admitting: Family Medicine

## 2019-06-09 ENCOUNTER — Other Ambulatory Visit: Payer: Self-pay

## 2019-06-09 VITALS — BP 120/60 | HR 68 | Temp 98.4°F | Resp 14 | Ht 64.0 in

## 2019-06-09 DIAGNOSIS — K8050 Calculus of bile duct without cholangitis or cholecystitis without obstruction: Secondary | ICD-10-CM

## 2019-06-09 MED ORDER — OXYCODONE-ACETAMINOPHEN 5-325 MG PO TABS: 1.0000 | ORAL_TABLET | ORAL | 0 refills | Status: DC | PRN

## 2019-06-09 MED ORDER — PANTOPRAZOLE SODIUM 40 MG PO TBEC: 40.0000 mg | DELAYED_RELEASE_TABLET | Freq: Every day | ORAL | 3 refills | Status: AC

## 2019-06-09 NOTE — Progress Notes (Signed)
Subjective:    Patient ID: Emily Owen, female    DOB: [birth year], 85 y.o.   MRN: [id number]  HPI Over the last 2 weeks, the patient has had intermittent sharp right upper quadrant epigastric abdominal pain.  H will she went to the emergency room with nausea and epigastric abdominal discomfort.  Patient had a CMP that was unremarkable and a CBC that was unremarkable.  Past medical history is significant for gallstone pancreatitis requiring a previous admission.  On a CT scan in April of this year, cholelithiasis was still found to be present within the gallbladder.  Over the last 2 weeks, she reports intermittent sharp spasm-like pain in the right upper quadrant and epigastric area.  This is associated with nausea and a bloated feeling.  She denies any fever.  She denies any heartburn.  She denies any melena.  She denies any hematochezia.  She has loose bowel movements every day.  This is a chronic finding ever since her previous colon resection.  Denies any change in her bowel habits.  Today on exam she is mildly tender to palpation in the right upper quadrant just below her breast.  She has no tenderness to palpation in the left upper quadrant, right lower quadrant, or left lower quadrant.  She denies any hematuria or dysuria. Past Medical History:  Diagnosis Date  . Cancer ([place])    colon  . Cancer of cervix ([place])    cervix  . COPD (chronic obstructive pulmonary disease) ([place])   . Diabetes mellitus without complication ([place])   . Gallstones   . Gout   . Hard of hearing   . On home O2   . Osteoporosis   . Pleural effusion   . Raynaud's disease    Past Surgical History:  Procedure Laterality Date  . ABDOMINAL HYSTERECTOMY     for cervical cancer 1965  . COLON SURGERY     hemicolectomy 1998  . gallstone surgery     ERCP for gallstone pancreatitis  . ORTHOPEDIC SURGERY     Current Outpatient Medications on File Prior to Visit  Medication Sig Dispense Refill  . ANORO ELLIPTA  62.5-25 MCG/INH AEPB TAKE 1 PUFF BY MOUTH EVERY DAY 60 each 5  . Balsam [place]-Castor Oil (VENELEX) OINT Apply to bilateral buttocks and sacrum qshift and prn for shear to buttocks and prevention. Every shift    . gabapentin (NEURONTIN) 100 MG capsule Take 200 mg by mouth at bedtime as needed.     . Ipratropium-Albuterol (COMBIVENT RESPIMAT) 20-100 MCG/ACT AERS respimat Inhale 1 puff into the lungs every 4 (four) hours as needed for wheezing. 4 g 11  . ondansetron (ZOFRAN ODT) 4 MG disintegrating tablet Take 1 tablet (4 mg total) by mouth every 8 (eight) hours as needed for nausea or vomiting. 20 tablet 0  . OXYGEN Inhale 3 L into the lungs continuous. [facility]    . Respiratory Therapy Supplies (FLUTTER) DEVI Use as directed. 1 each 0   No current facility-administered medications on file prior to visit.    No Known Allergies Social History   Socioeconomic History  . Marital status: Widowed    Spouse name: Not on file  . Number of children: Not on file  . Years of education: Not on file  . Highest education level: Not on file  Occupational History  . Not on file  Social Needs  . Financial resource strain: Not on file  . Food insecurity    Worry: Not on file  Inability: Not on file  . Transportation needs    Medical: Not on file    Non-medical: Not on file  Tobacco Use  . Smoking status: Former Smoker    Packs/day: 2.00    Years: 35.00    Pack years: 70.00    Types: Cigarettes    Quit date: 08/11/1994    Years since quitting: 24.8  . Smokeless tobacco: Never Used  Substance and Sexual Activity  . Alcohol use: No    Alcohol/week: 0.0 standard drinks  . Drug use: No  . Sexual activity: Not on file  Lifestyle  . Physical activity    Days per week: Not on file    Minutes per session: Not on file  . Stress: Not on file  Relationships  . Social Herbalist on phone: Not on file    Gets together: Not on file    Attends religious service: Not on file    Active  member of club or organization: Not on file    Attends meetings of clubs or organizations: Not on file    Relationship status: Not on file  . Intimate partner violence    Fear of current or ex partner: Not on file    Emotionally abused: Not on file    Physically abused: Not on file    Forced sexual activity: Not on file  Other Topics Concern  . Not on file  Social History Narrative  . Not on file      Review of Systems  All other systems reviewed and are negative.      Objective:   Physical Exam Constitutional:      General: She is not in acute distress.    Appearance: Normal appearance. She is not ill-appearing, toxic-appearing or diaphoretic.  Cardiovascular:     Rate and Rhythm: Normal rate and regular rhythm.     Heart sounds: Normal heart sounds.  Pulmonary:     Effort: Pulmonary effort is normal. No respiratory distress.     Breath sounds: Examination of the right-lower field reveals rales. Examination of the left-lower field reveals rales. Decreased breath sounds, wheezing and rales present.  Abdominal:     General: Abdomen is flat. Bowel sounds are normal. There is no distension.     Palpations: Abdomen is soft. There is no mass.     Tenderness: There is abdominal tenderness in the right upper quadrant. There is no right CVA tenderness, left CVA tenderness or guarding.    Neurological:     Mental Status: She is alert.           Assessment & Plan:  The encounter diagnosis was Biliary colic. I believe the patient's pain is most likely biliary colic.  Will obtain a CBC today to evaluate for leukocytosis, CMP to evaluate for any evidence of possible choledocholithiasis given an elevated bilirubin or elevated liver function test and also check a lipase.  If the lab work is negative, I would recommend symptomatic care with Percocet 5/325 1 p.o. every 6 hours as needed pain.  The patient will be a very high surgical risk and therefore I believe an elective  cholecystectomy is unlikely.  We discussed this at length and I explained the implications of this.  However if the patient develops evidence of biliary tract obstruction such as fever, jaundice, worsening right upper quadrant pain, I have directed her to go to the emergency room immediately as the patient may require ERCP or stent placement.  Spent  more than 30 minutes today with the patient and her family explaining the risk of surgery.  We also discussed a referral to general surgery to discuss an outpatient cholecystectomy however I do believe the patient would be extremely high surgical risk and would likely fare poorly postoperatively.  Therefore I have recommended treating with pain medication unless there is evidence of biliary tract obstruction requiring emergency room evaluation.  Patient and family understand and agree with the plan.  I will also start the patient on Protonix 40 mg a day in case this is possibly a duodenal ulcer.  Reassess immediately if patient's symptoms change in any way.  The bibasilar crackles are chronic in this patient.  She has a history of bibasilar airspace disease and small pleural effusions seen most recently on chest x-ray in April.  This is a chronic finding and not a change for this patient

## 2019-06-10 LAB — LIPASE: Lipase: 18 U/L (ref 7–60)

## 2019-06-10 LAB — COMPLETE METABOLIC PANEL WITH GFR
AG Ratio: 1.5 (calc) (ref 1.0–2.5)
ALT: 8 U/L (ref 6–29)
AST: 15 U/L (ref 10–35)
Albumin: 3.7 g/dL (ref 3.6–5.1)
Alkaline phosphatase (APISO): 57 U/L (ref 37–153)
BUN/Creatinine Ratio: 24 (calc) — ABNORMAL HIGH (ref 6–22)
BUN: 22 mg/dL (ref 7–25)
CO2: 35 mmol/L — ABNORMAL HIGH (ref 20–32)
Calcium: 8.9 mg/dL (ref 8.6–10.4)
Chloride: 99 mmol/L (ref 98–110)
Creat: 0.93 mg/dL — ABNORMAL HIGH (ref 0.60–0.88)
GFR, Est African American: 64 mL/min/{1.73_m2} (ref >=60)
GFR, Est Non African American: 56 mL/min/{1.73_m2} — ABNORMAL LOW (ref >=60)
Globulin: 2.4 g/dL (calc) (ref 1.9–3.7)
Glucose, Bld: 136 mg/dL — ABNORMAL HIGH (ref 65–99)
Potassium: 4.6 mmol/L (ref 3.5–5.3)
Sodium: 141 mmol/L (ref 135–146)
Total Bilirubin: 0.3 mg/dL (ref 0.2–1.2)
Total Protein: 6.1 g/dL (ref 6.1–8.1)

## 2019-06-10 LAB — CBC WITH DIFFERENTIAL/PLATELET
Absolute Monocytes: 540 cells/uL (ref 200–950)
Basophils Absolute: 60 cells/uL (ref 0–200)
Basophils Relative: 0.8 %
Eosinophils Absolute: 323 cells/uL (ref 15–500)
Eosinophils Relative: 4.3 %
HCT: 34.6 % — ABNORMAL LOW (ref 35.0–45.0)
Hemoglobin: 10.6 g/dL — ABNORMAL LOW (ref 11.7–15.5)
Lymphs Abs: 1455 cells/uL (ref 850–3900)
MCH: 27.2 pg (ref 27.0–33.0)
MCHC: 30.6 g/dL — ABNORMAL LOW (ref 32.0–36.0)
MCV: 88.7 fL (ref 80.0–100.0)
MPV: 8.9 fL (ref 7.5–12.5)
Monocytes Relative: 7.2 %
Neutro Abs: 5123 cells/uL (ref 1500–7800)
Neutrophils Relative %: 68.3 %
Platelets: 245 10*3/uL (ref 140–400)
RBC: 3.9 10*6/uL (ref 3.80–5.10)
RDW: 17.1 % — ABNORMAL HIGH (ref 11.0–15.0)
Total Lymphocyte: 19.4 %
WBC: 7.5 10*3/uL (ref 3.8–10.8)

## 2019-06-11 ENCOUNTER — Encounter: Payer: Self-pay | Admitting: Family Medicine

## 2019-06-13 DIAGNOSIS — J449 Chronic obstructive pulmonary disease, unspecified: Secondary | ICD-10-CM | POA: Diagnosis not present

## 2019-06-24 DIAGNOSIS — J449 Chronic obstructive pulmonary disease, unspecified: Secondary | ICD-10-CM | POA: Diagnosis not present

## 2019-06-26 DIAGNOSIS — J449 Chronic obstructive pulmonary disease, unspecified: Secondary | ICD-10-CM | POA: Diagnosis not present

## 2019-07-02 ENCOUNTER — Encounter: Payer: Self-pay | Admitting: Family Medicine

## 2019-07-03 ENCOUNTER — Other Ambulatory Visit: Payer: Self-pay | Admitting: Family Medicine

## 2019-07-03 MED ORDER — FUROSEMIDE 40 MG PO TABS: 40.0000 mg | ORAL_TABLET | Freq: Every day | ORAL | 0 refills | Status: DC | PRN

## 2019-07-09 ENCOUNTER — Other Ambulatory Visit: Payer: Self-pay

## 2019-07-09 ENCOUNTER — Encounter: Payer: Self-pay | Admitting: Family Medicine

## 2019-07-09 ENCOUNTER — Ambulatory Visit (INDEPENDENT_AMBULATORY_CARE_PROVIDER_SITE_OTHER): Payer: Medicare HMO | Admitting: Family Medicine

## 2019-07-09 VITALS — BP 100/50 | HR 70 | Temp 98.6°F | Resp 18 | Ht 64.0 in | Wt 162.0 lb

## 2019-07-09 DIAGNOSIS — M7989 Other specified soft tissue disorders: Secondary | ICD-10-CM | POA: Diagnosis not present

## 2019-07-09 DIAGNOSIS — Z23 Encounter for immunization: Secondary | ICD-10-CM | POA: Diagnosis not present

## 2019-07-09 MED ORDER — MELOXICAM 7.5 MG PO TABS: 7.5000 mg | ORAL_TABLET | Freq: Every day | ORAL | 0 refills | Status: DC

## 2019-07-09 NOTE — Progress Notes (Signed)
Subjective:    Patient ID: Emily Owen, female    DOB: 29-Jun-1930, 85 y.o.   MRN: [id number]  HPI  05/2019 Over the last 2 weeks, the patient has had intermittent sharp right upper quadrant epigastric abdominal pain.  H will she went to the emergency room with nausea and epigastric abdominal discomfort.  Patient had a CMP that was unremarkable and a CBC that was unremarkable.  Past medical history is significant for gallstone pancreatitis requiring a previous admission.  On a CT scan in April of this year, cholelithiasis was still found to be present within the gallbladder.  Over the last 2 weeks, she reports intermittent sharp spasm-like pain in the right upper quadrant and epigastric area.  This is associated with nausea and a bloated feeling.  She denies any fever.  She denies any heartburn.  She denies any melena.  She denies any hematochezia.  She has loose bowel movements every day.  This is a chronic finding ever since her previous colon resection.  Denies any change in her bowel habits.  Today on exam she is mildly tender to palpation in the right upper quadrant just below her breast.  She has no tenderness to palpation in the left upper quadrant, right lower quadrant, or left lower quadrant.  She denies any hematuria or dysuria.  At that time, my plan was: I believe the patient's pain is most likely biliary colic.  Will obtain a CBC today to evaluate for leukocytosis, CMP to evaluate for any evidence of possible choledocholithiasis given an elevated bilirubin or elevated liver function test and also check a lipase.  If the lab work is negative, I would recommend symptomatic care with Percocet 5/325 1 p.o. every 6 hours as needed pain.  The patient will be a very high surgical risk and therefore I believe an elective cholecystectomy is unlikely.  We discussed this at length and I explained the implications of this.  However if the patient develops evidence of biliary tract obstruction such as  fever, jaundice, worsening right upper quadrant pain, I have directed her to go to the emergency room immediately as the patient may require ERCP or stent placement.  Spent more than 30 minutes today with the patient and her family explaining the risk of surgery.  We also discussed a referral to general surgery to discuss an outpatient cholecystectomy however I do believe the patient would be extremely high surgical risk and would likely fare poorly postoperatively.  Therefore I have recommended treating with pain medication unless there is evidence of biliary tract obstruction requiring emergency room evaluation.  Patient and family understand and agree with the plan.  I will also start the patient on Protonix 40 mg a day in case this is possibly a duodenal ulcer.  Reassess immediately if patient's symptoms change in any way.  The bibasilar crackles are chronic in this patient.  She has a history of bibasilar airspace disease and small pleural effusions seen most recently on chest x-ray in April.  This is a chronic finding and not a change for this patient  07/09/19 Patient has had no further abdominal pain and is actually doing relatively well.  However recently she has developed swelling in both of her ankles.  Her family gave her Lasix over the weekend and she took it for 3 days.  The swelling has improved.  Today she has trace bipedal edema in both feet and just above the ankles bilaterally.  After that the swelling resolves.  She denies any shortness of breath beyond her baseline.  There is no edema in her arms or in her hands.  Her lungs are clear to auscultation bilaterally aside from her diminished breath sounds.  She denies any chest pain.  She denies any pleurisy.  There is no erythema or warmth in her lower extremities.  She also complains of aching pain in the joints of her hands.  Primarily this involves the PIP and DIP joints bilaterally.  She also complains of aching pain in her feet and in her  ankles.  This is mild and chronic and likely represents osteoarthritis.  She denies any burning stinging pain in her hands or feet.  She is already on gabapentin for neuropathy and this does not seem to help the aching pain Past Medical History:  Diagnosis Date  . Cancer ([place])    colon  . Cancer of cervix ([place])    cervix  . COPD (chronic obstructive pulmonary disease) ([place])   . Diabetes mellitus without complication ([place])   . Gallstones   . Gout   . Hard of hearing   . On home O2   . Osteoporosis   . Pleural effusion   . Raynaud's disease    Past Surgical History:  Procedure Laterality Date  . ABDOMINAL HYSTERECTOMY     for cervical cancer 1965  . COLON SURGERY     hemicolectomy 1998  . gallstone surgery     ERCP for gallstone pancreatitis  . ORTHOPEDIC SURGERY     Current Outpatient Medications on File Prior to Visit  Medication Sig Dispense Refill  . ANORO ELLIPTA 62.5-25 MCG/INH AEPB TAKE 1 PUFF BY MOUTH EVERY DAY 60 each 5  . Balsam [place]-Castor Oil (VENELEX) OINT Apply to bilateral buttocks and sacrum qshift and prn for shear to buttocks and prevention. Every shift    . furosemide (LASIX) 40 MG tablet Take 1 tablet (40 mg total) by mouth daily as needed for fluid. 30 tablet 0  . gabapentin (NEURONTIN) 100 MG capsule Take 200 mg by mouth at bedtime as needed.     . Ipratropium-Albuterol (COMBIVENT RESPIMAT) 20-100 MCG/ACT AERS respimat Inhale 1 puff into the lungs every 4 (four) hours as needed for wheezing. 4 g 11  . ondansetron (ZOFRAN ODT) 4 MG disintegrating tablet Take 1 tablet (4 mg total) by mouth every 8 (eight) hours as needed for nausea or vomiting. 20 tablet 0  . oxyCODONE-acetaminophen (PERCOCET) 5-325 MG tablet Take 1 tablet by mouth every 4 (four) hours as needed for severe pain. 30 tablet 0  . OXYGEN Inhale 3 L into the lungs continuous. [facility]    . pantoprazole (PROTONIX) 40 MG tablet Take 1 tablet (40 mg total) by mouth daily. 30 tablet 3  . Respiratory  Therapy Supplies (FLUTTER) DEVI Use as directed. 1 each 0  . traMADol (ULTRAM) 50 MG tablet TAKE 1 TABLET (50 MG TOTAL) BY MOUTH EVERY 8 (EIGHT) HOURS AS NEEDED FOR UP TO 5 DAYS.     No current facility-administered medications on file prior to visit.    No Known Allergies Social History   Socioeconomic History  . Marital status: Widowed    Spouse name: Not on file  . Number of children: Not on file  . Years of education: Not on file  . Highest education level: Not on file  Occupational History  . Not on file  Social Needs  . Financial resource strain: Not on file  . Food insecurity    Worry:  Not on file    Inability: Not on file  . Transportation needs    Medical: Not on file    Non-medical: Not on file  Tobacco Use  . Smoking status: Former Smoker    Packs/day: 2.00    Years: 35.00    Pack years: 70.00    Types: Cigarettes    Quit date: 08/11/1994    Years since quitting: 24.9  . Smokeless tobacco: Never Used  Substance and Sexual Activity  . Alcohol use: No    Alcohol/week: 0.0 standard drinks  . Drug use: No  . Sexual activity: Not on file  Lifestyle  . Physical activity    Days per week: Not on file    Minutes per session: Not on file  . Stress: Not on file  Relationships  . Social Herbalist on phone: Not on file    Gets together: Not on file    Attends religious service: Not on file    Active member of club or organization: Not on file    Attends meetings of clubs or organizations: Not on file    Relationship status: Not on file  . Intimate partner violence    Fear of current or ex partner: Not on file    Emotionally abused: Not on file    Physically abused: Not on file    Forced sexual activity: Not on file  Other Topics Concern  . Not on file  Social History Narrative  . Not on file      Review of Systems  All other systems reviewed and are negative.      Objective:   Physical Exam Constitutional:      General: She is not in  acute distress.    Appearance: Normal appearance. She is not ill-appearing, toxic-appearing or diaphoretic.  Cardiovascular:     Rate and Rhythm: Normal rate and regular rhythm.     Heart sounds: Normal heart sounds.  Pulmonary:     Effort: Pulmonary effort is normal. No respiratory distress.     Breath sounds: Decreased breath sounds present. No wheezing or rales.  Abdominal:     General: Abdomen is flat. Bowel sounds are normal. There is no distension.     Palpations: Abdomen is soft. There is no mass.     Tenderness: There is no abdominal tenderness. There is no right CVA tenderness, left CVA tenderness or guarding.  Musculoskeletal:     Right lower leg: Edema present.     Left lower leg: Edema present.  Neurological:     Mental Status: She is alert.           Assessment & Plan:  Leg swelling - Plan: BASIC METABOLIC PANEL WITH GFR, Brain natriuretic peptide  Needs flu shot - Plan: Flu Vaccine QUAD High Dose(Fluad)  Patient only has trace edema in her feet.  I believe this is most likely dependent edema.  I have recommended the patient wear knee-high compression hose to help manage this to avoid overuse of diuretics.  I explained to the family that overuse of diuretics can potentially cause dehydration and renal insufficiency.  Therefore they will only use Lasix when the swelling worsens and try to manage the dependent edema with compression hose.  We have also recommended some exercises to help improve circulation in her legs while sitting.  I will check a BMP to monitor renal function and I will also check a BNP to rule out evidence of heart failure however based  on her history and her exam there is no evidence of congestive heart failure or fluid overload.  I believe the aching pain in her hands and feet is likely due to osteoarthritis.  We will try meloxicam 7.5 mg daily.  I recommended that they use this sparingly to avoid GI toxicity and also renal toxicity.  However I believe  the benefits outweigh the risk of that helps manage her pain as we are trying to focus more on quality of life given her advanced age and medical comorbidities.

## 2019-07-10 LAB — BASIC METABOLIC PANEL WITH GFR
BUN/Creatinine Ratio: 23 (calc) — ABNORMAL HIGH (ref 6–22)
BUN: 23 mg/dL (ref 7–25)
CO2: 37 mmol/L — ABNORMAL HIGH (ref 20–32)
Calcium: 8.3 mg/dL — ABNORMAL LOW (ref 8.6–10.4)
Chloride: 97 mmol/L — ABNORMAL LOW (ref 98–110)
Creat: 0.98 mg/dL — ABNORMAL HIGH (ref 0.60–0.88)
GFR, Est African American: 61 mL/min/{1.73_m2} (ref >=60)
GFR, Est Non African American: 52 mL/min/{1.73_m2} — ABNORMAL LOW (ref >=60)
Glucose, Bld: 185 mg/dL — ABNORMAL HIGH (ref 65–99)
Potassium: 4.1 mmol/L (ref 3.5–5.3)
Sodium: 143 mmol/L (ref 135–146)

## 2019-07-10 LAB — BRAIN NATRIURETIC PEPTIDE: Brain Natriuretic Peptide: 38 pg/mL (ref <100)

## 2019-07-14 DIAGNOSIS — J449 Chronic obstructive pulmonary disease, unspecified: Secondary | ICD-10-CM | POA: Diagnosis not present

## 2019-07-23 ENCOUNTER — Ambulatory Visit (INDEPENDENT_AMBULATORY_CARE_PROVIDER_SITE_OTHER): Payer: Medicare HMO | Admitting: Family Medicine

## 2019-07-23 ENCOUNTER — Other Ambulatory Visit: Payer: Self-pay

## 2019-07-23 DIAGNOSIS — J441 Chronic obstructive pulmonary disease with (acute) exacerbation: Secondary | ICD-10-CM | POA: Diagnosis not present

## 2019-07-23 MED ORDER — LEVOFLOXACIN 500 MG PO TABS: 500.0000 mg | ORAL_TABLET | Freq: Every day | ORAL | 0 refills | Status: DC

## 2019-07-23 MED ORDER — PREDNISONE 20 MG PO TABS: 60.0000 mg | ORAL_TABLET | Freq: Every day | ORAL | 0 refills | Status: DC

## 2019-07-23 NOTE — Progress Notes (Signed)
Subjective:    Patient ID: Emily Owen, female    DOB: 01-12-1930, 85 y.o.   MRN: [id number]  HPI Patient is being seen today as a telephone visit.  She consents to be seen by the telephone.  Her daughter provides majority of the history over the telephone.  Phone call began at 940.  Phone call concluded at 951.  Patient has a history of end-stage COPD currently on oxygen.  3 to 4 days ago she began to experience rhonchorous breath sounds, increasing wheezing, increasing shortness of breath, worsening cough.  Cough is productive of green sputum.  She has been confined to home and so they do not feel that she has been exposed to [place].  She has had these exact before and has a history of frequent COPD exacerbations.  Family denies any swelling in her legs to suggest congestive heart failure.  She is afebrile.  She has not been reporting any chest pain.  She does have worsening depression and worsening anxiety.  She denies any pleurisy or hemoptysis. Past Medical History:  Diagnosis Date   Cancer [facility])    colon   Cancer of cervix ([place])    cervix   COPD (chronic obstructive pulmonary disease) ([place])    Diabetes mellitus without complication ([place])    Gallstones    Gout    Hard of hearing    On home O2    Osteoporosis    Pleural effusion    Raynaud's disease    Past Surgical History:  Procedure Laterality Date   ABDOMINAL HYSTERECTOMY     for cervical cancer 1965   COLON SURGERY     hemicolectomy 1998   gallstone surgery     ERCP for gallstone pancreatitis   ORTHOPEDIC SURGERY     Current Outpatient Medications on File Prior to Visit  Medication Sig Dispense Refill   ANORO ELLIPTA 62.5-25 MCG/INH AEPB TAKE 1 PUFF BY MOUTH EVERY DAY 60 each 5   Balsam [place]-Castor Oil (VENELEX) OINT Apply to bilateral buttocks and sacrum qshift and prn for shear to buttocks and prevention. Every shift     furosemide (LASIX) 40 MG tablet Take 1 tablet (40 mg total) by mouth  daily as needed for fluid. 30 tablet 0   gabapentin (NEURONTIN) 100 MG capsule Take 200 mg by mouth at bedtime as needed.      Ipratropium-Albuterol (COMBIVENT RESPIMAT) 20-100 MCG/ACT AERS respimat Inhale 1 puff into the lungs every 4 (four) hours as needed for wheezing. 4 g 11   meloxicam (MOBIC) 7.5 MG tablet Take 1 tablet (7.5 mg total) by mouth daily. 30 tablet 0   ondansetron (ZOFRAN ODT) 4 MG disintegrating tablet Take 1 tablet (4 mg total) by mouth every 8 (eight) hours as needed for nausea or vomiting. 20 tablet 0   oxyCODONE-acetaminophen (PERCOCET) 5-325 MG tablet Take 1 tablet by mouth every 4 (four) hours as needed for severe pain. 30 tablet 0   OXYGEN Inhale 3 L into the lungs continuous. [facility]     pantoprazole (PROTONIX) 40 MG tablet Take 1 tablet (40 mg total) by mouth daily. 30 tablet 3   Respiratory Therapy Supplies (FLUTTER) DEVI Use as directed. 1 each 0   traMADol (ULTRAM) 50 MG tablet TAKE 1 TABLET (50 MG TOTAL) BY MOUTH EVERY 8 (EIGHT) HOURS AS NEEDED FOR UP TO 5 DAYS.     No current facility-administered medications on file prior to visit.    No Known Allergies Social History   Socioeconomic  History   Marital status: Widowed    Spouse name: Not on file   Number of children: Not on file   Years of education: Not on file   Highest education level: Not on file  Occupational History   Not on file  Social Needs   Financial resource strain: Not on file   Food insecurity    Worry: Not on file    Inability: Not on file   Transportation needs    Medical: Not on file    Non-medical: Not on file  Tobacco Use   Smoking status: Former Smoker    Packs/day: 2.00    Years: 35.00    Pack years: 70.00    Types: Cigarettes    Quit date: 08/11/1994    Years since quitting: 24.9   Smokeless tobacco: Never Used  Substance and Sexual Activity   Alcohol use: No    Alcohol/week: 0.0 standard drinks   Drug use: No   Sexual activity: Not on file    Lifestyle   Physical activity    Days per week: Not on file    Minutes per session: Not on file   Stress: Not on file  Relationships   Social connections    Talks on phone: Not on file    Gets together: Not on file    Attends religious service: Not on file    Active member of club or organization: Not on file    Attends meetings of clubs or organizations: Not on file    Relationship status: Not on file   Intimate partner violence    Fear of current or ex partner: Not on file    Emotionally abused: Not on file    Physically abused: Not on file    Forced sexual activity: Not on file  Other Topics Concern   Not on file  Social History Narrative   Not on file      Review of Systems  All other systems reviewed and are negative.      Objective:   Physical Exam   Unable to perform a physical exam as the patient is seen over the telephone.  However I instructed the daughter that if the patient is having chest pain or worsening shortness of breath she needs to go to the emergency room to be evaluated further.  However the patient is sleeping and resting comfortably with no respiratory distress and therefore the daughter does not feel that this is necessary at this time.     Assessment & Plan:  COPD exacerbation ([place])  Although telephone encounter limits my ability to make an accurate diagnosis, her history is concerning for a COPD exacerbation versus community-acquired pneumonia.  COVID-19 would be less likely given her quarantined status.  Therefore I recommended starting the patient on prednisone 60 mg a day for 7 days given her wheezing and chest congestion.  I would start Levaquin 500 mg daily for 7 days and recommended that they give her Combivent 4 times a day on a scheduled basis for the next few days until her breathing improves.  Reassess on Monday or go to the emergency room if worsening.  Family is comfortable with this plan

## 2019-07-24 DIAGNOSIS — J449 Chronic obstructive pulmonary disease, unspecified: Secondary | ICD-10-CM | POA: Diagnosis not present

## 2019-07-25 ENCOUNTER — Other Ambulatory Visit: Payer: Self-pay | Admitting: Family Medicine

## 2019-07-26 ENCOUNTER — Other Ambulatory Visit: Payer: Self-pay

## 2019-07-26 ENCOUNTER — Encounter (HOSPITAL_COMMUNITY): Payer: Self-pay | Admitting: Emergency Medicine

## 2019-07-26 ENCOUNTER — Observation Stay (HOSPITAL_COMMUNITY)
Admission: EM | Admit: 2019-07-26 | Discharge: 2019-07-28 | Disposition: A | Discharge status: Home / Self Care | Payer: Medicare HMO | Attending: Family Medicine | Admitting: Family Medicine

## 2019-07-26 ENCOUNTER — Emergency Department (HOSPITAL_COMMUNITY): Payer: Medicare HMO

## 2019-07-26 DIAGNOSIS — E1165 Type 2 diabetes mellitus with hyperglycemia: Secondary | ICD-10-CM | POA: Diagnosis not present

## 2019-07-26 DIAGNOSIS — Z8541 Personal history of malignant neoplasm of cervix uteri: Secondary | ICD-10-CM | POA: Diagnosis not present

## 2019-07-26 DIAGNOSIS — Z6827 Body mass index (BMI) 27.0-27.9, adult: Secondary | ICD-10-CM | POA: Diagnosis not present

## 2019-07-26 DIAGNOSIS — Z87891 Personal history of nicotine dependence: Secondary | ICD-10-CM | POA: Diagnosis not present

## 2019-07-26 DIAGNOSIS — R410 Disorientation, unspecified: Principal | ICD-10-CM | POA: Insufficient documentation

## 2019-07-26 DIAGNOSIS — E119 Type 2 diabetes mellitus without complications: Secondary | ICD-10-CM | POA: Insufficient documentation

## 2019-07-26 DIAGNOSIS — R05 Cough: Secondary | ICD-10-CM | POA: Diagnosis not present

## 2019-07-26 DIAGNOSIS — Z79899 Other long term (current) drug therapy: Secondary | ICD-10-CM | POA: Insufficient documentation

## 2019-07-26 DIAGNOSIS — E785 Hyperlipidemia, unspecified: Secondary | ICD-10-CM | POA: Diagnosis present

## 2019-07-26 DIAGNOSIS — R509 Fever, unspecified: Secondary | ICD-10-CM | POA: Diagnosis not present

## 2019-07-26 DIAGNOSIS — E669 Obesity, unspecified: Secondary | ICD-10-CM | POA: Diagnosis not present

## 2019-07-26 DIAGNOSIS — R0902 Hypoxemia: Secondary | ICD-10-CM | POA: Diagnosis not present

## 2019-07-26 DIAGNOSIS — E860 Dehydration: Secondary | ICD-10-CM

## 2019-07-26 DIAGNOSIS — Z20828 Contact with and (suspected) exposure to other viral communicable diseases: Secondary | ICD-10-CM | POA: Insufficient documentation

## 2019-07-26 DIAGNOSIS — J189 Pneumonia, unspecified organism: Secondary | ICD-10-CM | POA: Diagnosis present

## 2019-07-26 DIAGNOSIS — Z85038 Personal history of other malignant neoplasm of large intestine: Secondary | ICD-10-CM | POA: Insufficient documentation

## 2019-07-26 DIAGNOSIS — I10 Essential (primary) hypertension: Secondary | ICD-10-CM | POA: Diagnosis present

## 2019-07-26 DIAGNOSIS — R9431 Abnormal electrocardiogram [ECG] [EKG]: Secondary | ICD-10-CM | POA: Diagnosis not present

## 2019-07-26 DIAGNOSIS — R5381 Other malaise: Secondary | ICD-10-CM | POA: Diagnosis not present

## 2019-07-26 DIAGNOSIS — R4182 Altered mental status, unspecified: Secondary | ICD-10-CM | POA: Diagnosis present

## 2019-07-26 DIAGNOSIS — Z03818 Encounter for observation for suspected exposure to other biological agents ruled out: Secondary | ICD-10-CM | POA: Diagnosis not present

## 2019-07-26 DIAGNOSIS — J449 Chronic obstructive pulmonary disease, unspecified: Secondary | ICD-10-CM | POA: Diagnosis present

## 2019-07-26 LAB — CBC WITH DIFFERENTIAL/PLATELET
Abs Immature Granulocytes: 0.09 10*3/uL — ABNORMAL HIGH (ref 0.00–0.07)
Basophils Absolute: 0 10*3/uL (ref 0.0–0.1)
Basophils Relative: 0 %
Eosinophils Absolute: 0 10*3/uL (ref 0.0–0.5)
Eosinophils Relative: 0 %
HCT: 33.7 % — ABNORMAL LOW (ref 36.0–46.0)
Hemoglobin: 10.1 g/dL — ABNORMAL LOW (ref 12.0–15.0)
Immature Granulocytes: 2 %
Lymphocytes Relative: 8 %
Lymphs Abs: 0.5 10*3/uL — ABNORMAL LOW (ref 0.7–4.0)
MCH: 27.5 pg (ref 26.0–34.0)
MCHC: 30 g/dL (ref 30.0–36.0)
MCV: 91.8 fL (ref 80.0–100.0)
Monocytes Absolute: 0.3 10*3/uL (ref 0.1–1.0)
Monocytes Relative: 6 %
Neutro Abs: 4.9 10*3/uL (ref 1.7–7.7)
Neutrophils Relative %: 84 %
Platelets: 238 10*3/uL (ref 150–400)
RBC: 3.67 MIL/uL — ABNORMAL LOW (ref 3.87–5.11)
RDW: 15.5 % (ref 11.5–15.5)
WBC: 5.8 10*3/uL (ref 4.0–10.5)
nRBC: 0 % (ref 0.0–0.2)

## 2019-07-26 LAB — COMPREHENSIVE METABOLIC PANEL
ALT: 11 U/L (ref 0–44)
AST: 16 U/L (ref 15–41)
Albumin: 3.4 g/dL — ABNORMAL LOW (ref 3.5–5.0)
Alkaline Phosphatase: 49 U/L (ref 38–126)
Anion gap: 9 (ref 5–15)
BUN: 24 mg/dL — ABNORMAL HIGH (ref 8–23)
CO2: 33 mmol/L — ABNORMAL HIGH (ref 22–32)
Calcium: 8.2 mg/dL — ABNORMAL LOW (ref 8.9–10.3)
Chloride: 92 mmol/L — ABNORMAL LOW (ref 98–111)
Creatinine, Ser: 1.03 mg/dL — ABNORMAL HIGH (ref 0.44–1.00)
GFR calc Af Amer: 57 mL/min — ABNORMAL LOW (ref >60)
GFR calc non Af Amer: 49 mL/min — ABNORMAL LOW (ref >60)
Glucose, Bld: 173 mg/dL — ABNORMAL HIGH (ref 70–99)
Potassium: 4.1 mmol/L (ref 3.5–5.1)
Sodium: 134 mmol/L — ABNORMAL LOW (ref 135–145)
Total Bilirubin: 0.5 mg/dL (ref 0.3–1.2)
Total Protein: 6.2 g/dL — ABNORMAL LOW (ref 6.5–8.1)

## 2019-07-26 LAB — LACTIC ACID, PLASMA
Lactic Acid, Venous: 1.2 mmol/L (ref 0.5–1.9)
Lactic Acid, Venous: 1.4 mmol/L (ref 0.5–1.9)

## 2019-07-26 LAB — BLOOD GAS, VENOUS
Acid-Base Excess: 11.5 mmol/L — ABNORMAL HIGH (ref 0.0–2.0)
Bicarbonate: 33.5 mmol/L — ABNORMAL HIGH (ref 20.0–28.0)
FIO2: 32
O2 Saturation: 61.1 %
Patient temperature: 37
pCO2, Ven: 61.3 mmHg — ABNORMAL HIGH (ref 44.0–60.0)
pH, Ven: 7.396 (ref 7.250–7.430)
pO2, Ven: 35 mmHg (ref 32.0–45.0)

## 2019-07-26 LAB — URINALYSIS, ROUTINE W REFLEX MICROSCOPIC
Bilirubin Urine: NEGATIVE
Glucose, UA: NEGATIVE mg/dL
Hgb urine dipstick: NEGATIVE
Ketones, ur: NEGATIVE mg/dL
Leukocytes,Ua: NEGATIVE
Nitrite: NEGATIVE
Protein, ur: NEGATIVE mg/dL
Specific Gravity, Urine: 1.005 (ref 1.005–1.030)
pH: 7 (ref 5.0–8.0)

## 2019-07-26 MED ORDER — SODIUM CHLORIDE 0.9 % IV BOLUS
500.0000 mL | Freq: Once | INTRAVENOUS | Status: AC
  Administered 2019-07-26: 21:00:00 500 mL via INTRAVENOUS

## 2019-07-26 NOTE — ED Provider Notes (Signed)
[facility] EMERGENCY DEPARTMENT Provider Note   CSN: [id number] Arrival date & time: 07/26/19  2023     History   Chief Complaint Chief Complaint  Patient presents with  . Altered Mental Status    HPI Emily Owen is a 85 y.o. female.     HPI   She comes from home where she was noted to be confused by family members.  They called EMS and had her transferred here.  She is unable to give history.  Level 5 caveat-confusion  Past Medical History:  Diagnosis Date  . Cancer ([place])    colon  . Cancer of cervix ([place])    cervix  . COPD (chronic obstructive pulmonary disease) ([place])   . Diabetes mellitus without complication ([place])   . Gallstones   . Gout   . Hard of hearing   . On home O2   . Osteoporosis   . Pleural effusion   . Raynaud's disease     Patient Active Problem List   Diagnosis Date Noted  . Altered mental status 07/26/2019  . Aspiration into airway 02/09/2019  . Chronic obstructive pulmonary disease with acute exacerbation ([place])   . Acute on chronic respiratory failure with hypoxia and hypercapnia (HCC)   . Palliative care by specialist   . Advanced care planning/counseling discussion   . Goals of care, counseling/discussion   . AMS (altered mental status) 02/07/2019  . DNR (do not resuscitate) 01/27/2019  . Pressure injury of skin 01/15/2019  . Severe sepsis ([place]) 01/15/2019  . RLL pneumonia 01/15/2019  . Generalized weakness 01/15/2019  . Left upper lobe pulmonary nodule 01/15/2019  . Raynaud's disease   . Pancreatitis 02/13/2018  . Routine general medical examination at a health care facility 07/19/2017  . Gout 03/14/2016  . Atherosclerosis of aorta ([place]) 06/11/2015  . Diabetes mellitus with peripheral vascular disease ([place])   . Chronic respiratory failure with hypercapnia ([place]) 06/02/2015  . COPD, severe ([place]) 03/25/2015  . Pleural effusion on right 03/25/2015  . COPD with acute exacerbation ([place]) 08/11/2014  . HTN (hypertension)  08/11/2014  . Acute on chronic respiratory failure with hypoxia ([place]) 08/22/2012  . Hyperlipidemia 08/20/2012    Past Surgical History:  Procedure Laterality Date  . ABDOMINAL HYSTERECTOMY     for cervical cancer 1965  . COLON SURGERY     hemicolectomy 1998  . gallstone surgery     ERCP for gallstone pancreatitis  . ORTHOPEDIC SURGERY       OB History    Gravida  6   Para  6   Term  6   Preterm      AB      Living  5     SAB      TAB      Ectopic      Multiple      Live Births               Home Medications    Prior to Admission medications   Medication Sig Start Date End Date Taking? Authorizing Provider  ANORO ELLIPTA 62.5-25 MCG/INH AEPB TAKE 1 PUFF BY MOUTH EVERY DAY 12/09/18  Yes Juanito Doom, MD  furosemide (LASIX) 40 MG tablet Take 1 tablet (40 mg total) by mouth daily as needed for fluid. 07/03/19  Yes [name], MD  gabapentin (NEURONTIN) 100 MG capsule Take 200 mg by mouth at bedtime as needed.    Yes [provider]  Ipratropium-Albuterol (COMBIVENT RESPIMAT) 20-100 MCG/ACT  AERS respimat Inhale 1 puff into the lungs every 4 (four) hours as needed for wheezing. 05/12/19  Yes [name], MD  levofloxacin (LEVAQUIN) 500 MG tablet Take 1 tablet (500 mg total) by mouth daily. 07/23/19  Yes [name], MD  meloxicam (MOBIC) 7.5 MG tablet Take 1 tablet (7.5 mg total) by mouth daily. 07/09/19  Yes [name], MD  ondansetron (ZOFRAN ODT) 4 MG disintegrating tablet Take 1 tablet (4 mg total) by mouth every 8 (eight) hours as needed for nausea or vomiting. 06/03/19  Yes Robinson, [place] N, PA-C  pantoprazole (PROTONIX) 40 MG tablet Take 1 tablet (40 mg total) by mouth daily. 06/09/19  Yes [name], MD  predniSONE (DELTASONE) 20 MG tablet Take 3 tablets (60 mg total) by mouth daily with breakfast. 07/23/19  Yes Pickard, Cammie Mcgee, MD  Balsam [place]-Castor Oil [facility]) OINT Apply to bilateral buttocks and sacrum  qshift and prn for shear to buttocks and prevention. Every shift    [provider]  oxyCODONE-acetaminophen (PERCOCET) 5-325 MG tablet Take 1 tablet by mouth every 4 (four) hours as needed for severe pain. 06/09/19   [name], MD  OXYGEN Inhale 3 L into the lungs continuous. [employer]    [provider]  Respiratory Therapy Supplies (FLUTTER) DEVI Use as directed. 10/01/18   Parrett, Fonnie Mu, NP  traMADol (ULTRAM) 50 MG tablet TAKE 1 TABLET (50 MG TOTAL) BY MOUTH EVERY 8 (EIGHT) HOURS AS NEEDED FOR UP TO 5 DAYS. 06/15/19   [provider]    Family History Family History  Problem Relation Age of Onset  . Diabetes Sister   . Diabetes Sister   . Diabetes Sister   . Hypertension Mother   . Heart disease Mother   . Diabetes Sister     Social History Social History   Tobacco Use  . Smoking status: Former Smoker    Packs/day: 2.00    Years: 35.00    Pack years: 70.00    Types: Cigarettes    Quit date: 08/11/1994    Years since quitting: 24.9  . Smokeless tobacco: Never Used  Substance Use Topics  . Alcohol use: No    Alcohol/week: 0.0 standard drinks  . Drug use: No     Allergies   Patient has no known allergies.   Review of Systems Review of Systems  All other systems reviewed and are negative.    Physical Exam Updated Vital Signs BP (!) 141/67   Pulse 72   Temp 100.1 F (37.8 C) (Rectal)   Resp 17   Ht 5\' 4"  (1.626 m)   Wt 73.5 kg   SpO2 100%   BMI 27.81 kg/m   Physical Exam Vitals signs and nursing note reviewed.  Constitutional:      General: She is not in acute distress.    Appearance: She is well-developed. She is obese. She is not ill-appearing, toxic-appearing or diaphoretic.  HENT:     Head: Normocephalic and atraumatic.     Right Ear: External ear normal.     Left Ear: External ear normal.     Mouth/Throat:     Mouth: Mucous membranes are moist.     Pharynx: No oropharyngeal exudate.  Eyes:      Conjunctiva/sclera: Conjunctivae normal.     Pupils: Pupils are equal, round, and reactive to light.  Neck:     Musculoskeletal: Normal range of motion and neck supple.     Trachea: Phonation normal.  Cardiovascular:     Rate and Rhythm: Normal rate and regular rhythm.     Heart sounds: Normal heart sounds.  Pulmonary:     Effort: Pulmonary effort is normal.     Breath sounds: Normal breath sounds.  Abdominal:     General: There is no distension.     Palpations: Abdomen is soft.     Tenderness: There is no abdominal tenderness.     Hernia: No hernia is present.  Musculoskeletal: Normal range of motion.        General: No swelling or tenderness.     Right lower leg: No edema.     Left lower leg: No edema.  Skin:    General: Skin is warm and dry.     Coloration: Skin is not jaundiced or pale.  Neurological:     Mental Status: She is alert. She is disoriented.     Cranial Nerves: No cranial nerve deficit.     Sensory: No sensory deficit.     Motor: No abnormal muscle tone.     Coordination: Coordination normal.  Psychiatric:        Mood and Affect: Mood normal.        Behavior: Behavior normal.      ED Treatments / Results  Labs (all labs ordered are listed, but only abnormal results are displayed) Labs Reviewed  BLOOD GAS, VENOUS - Abnormal; Notable for the following components:      Result Value   pCO2, Ven 61.3 (*)    Bicarbonate 33.5 (*)    Acid-Base Excess 11.5 (*)    All other components within normal limits  URINALYSIS, ROUTINE W REFLEX MICROSCOPIC - Abnormal; Notable for the following components:   Color, Urine STRAW (*)    All other components within normal limits  COMPREHENSIVE METABOLIC PANEL - Abnormal; Notable for the following components:   Sodium 134 (*)    Chloride 92 (*)    CO2 33 (*)    Glucose, Bld 173 (*)    BUN 24 (*)    Creatinine, Ser 1.03 (*)    Calcium 8.2 (*)    Total Protein 6.2 (*)    Albumin 3.4 (*)    GFR calc non Af Amer 49 (*)     GFR calc Af Amer 57 (*)    All other components within normal limits  CBC WITH DIFFERENTIAL/PLATELET - Abnormal; Notable for the following components:   RBC 3.67 (*)    Hemoglobin 10.1 (*)    HCT 33.7 (*)    Lymphs Abs 0.5 (*)    Abs Immature Granulocytes 0.09 (*)    All other components within normal limits  CULTURE, BLOOD (ROUTINE X 2)  CULTURE, BLOOD (ROUTINE X 2)  URINE CULTURE  SARS CORONAVIRUS 2 (TAT 6-24 HRS)  LACTIC ACID, PLASMA  LACTIC ACID, PLASMA    EKG None  Radiology Dg Chest Port 1 View  Result Date: 07/26/2019 CLINICAL DATA:  Cough. Low-grade fever. Altered mental status. Possible urinary tract infection. EXAM: PORTABLE CHEST 1 VIEW COMPARISON:  02/09/2019 FINDINGS: Normal sized heart. Mildly tortuous aorta. Continued patient rotation to the right. Ill-defined increased density is again demonstrated at the right lung base with decreased pleural fluid. Clear left lung. Diffuse osteopenia. IMPRESSION: 1. Stable right lower lobe pneumonia or atelectasis. 2. Decreased right pleural fluid. 3. No new abnormality. Electronically Signed   By: Claudie Revering M.D.   On: 07/26/2019 21:46    Procedures Procedures (including critical care time)  Medications Ordered  in ED Medications  sodium chloride 0.9 % bolus 500 mL (0 mLs Intravenous Stopped 07/26/19 2322)     Initial Impression / Assessment and Plan / ED Course  I have reviewed the triage vital signs and the nursing notes.  Pertinent labs & imaging results that were available during my care of the patient were reviewed by me and considered in my medical decision making (see chart for details).  Clinical Course as of Jul 25 2337  Nancy Fetter Jul 26, 2019  2056 Patient's daughter is here now and states that the patient's current symptoms are primarily confusion and urinary frequency.  Today the daughter came to help her sister who lives with the patient, and the patient did not recognize her.  The patient asked to talk to the  daughter, and she went to another room and spoke with the patient on the phone.  At that point the patient was confused and paranoid.  Family tried to give her melatonin about 4 PM to help her sleep but it did not work.  The daughter states that the patient has been confused, since she was started on prednisone 60 mg, 4 days ago.  This was started to treat pneumonia.  It was initiated during a tele-visit so the patient has not had a chest x-ray.  No known sick contacts.  There are no other complaints at this time.   [EW]  2157 Normal except hemoglobin low  CBC with Differential(!) [EW]  2200 DG Chest Port 1 View [EW]  2201 Right lower lobe density, ill-defined, similar to 02/09/2019.  Image reviewed by me.   [EW]  2205 Normal except sodium low, chloride low, CO2 high, glucose high, BUN high, creatinine high, calcium low, total protein low, albumin low, GFR low  Comprehensive metabolic panel(!) [EW]  123456 Normal  Urinalysis, Routine w reflex microscopic(!) [EW]  2308 Normal except PCO2 high, bicarb low  Blood gas, venous(!) [EW]  2309 Normal except hemoglobin low  CBC with Differential(!) [EW]  2309 Normal  Lactic Acid, Venous: 1.4 [EW]  2320 Creatinine(!): 1.03 [EW]    Clinical Course User Index [EW] Daleen Bo, MD        Patient Vitals for the past 24 hrs:  BP Temp Temp src Pulse Resp SpO2 Height Weight  07/26/19 2200 (!) 141/67 - - 72 17 100 % - -  07/26/19 2100 134/62 - - 67 19 100 % - -  07/26/19 2043 123/67 100.1 F (37.8 C) Rectal 71 18 - - -  07/26/19 2037 - - - - - - 5\' 4"  (1.626 m) 73.5 kg    11:09 PM Reevaluation with update and discussion. After initial assessment and treatment, an updated evaluation reveals no change in clinical status.Daleen Bo   Medical Decision Making: Confusion likely from steroids, without signs of serious bacterial infection or metabolic instability.  Doubt UTI.  Doubt sepsis.  Doubt CVA.  Patient has abnormal chest x-ray, with chronic  findings.  No clear evidence for pneumonia.  She did have some coughing prior to being started on levofloxacin, which was done empirically about 4 days ago.  She may benefit from repeat chest CT, and pulmonary evaluation.  Chemistry panel with mild metabolic abnormalities most likely related to dehydration.  She has mild elevated PCO2, on venous gas.  Unclear cause, possibly chronic hypoventilation.  She will require hospitalization for safety and ongoing observation.  CRITICAL CARE-no Performed by: Daleen Bo  Nursing Notes Reviewed/ Care Coordinated Applicable Imaging Reviewed Interpretation of Laboratory  Data incorporated into ED treatment  11:11 PM-Consult complete with hospitalist. Patient case explained and discussed.  He agrees to admit patient for further evaluation and treatment. Call ended at 11:25 PM  Plan: Admit  Final Clinical Impressions(s) / ED Diagnoses   Final diagnoses:  Delirium  Dehydration    ED Discharge Orders    None       Daleen Bo, MD 07/26/19 2349

## 2019-07-26 NOTE — ED Triage Notes (Signed)
Patient brought in by EMS for altered mental status. Patient's family reported that patient was walking around the house trying to move items and was acting abonormal. Family thinks patient may have a UTI. Patient states pain in her lower abdominal area. Patient is able to answer some questions but is obviously altered upon further assessment.

## 2019-07-26 NOTE — H&P (Signed)
History and Physical    Emily Owen [id number] DOB: 08-17-1930 DOA: 07/26/2019  PCP: [name], MD (Confirm with patient/family/NH records and if not entered, this has to be entered at [facility] point of entry) Patient coming from: Home   I have personally briefly reviewed patient's old medical records in [place]  Chief Complaint: Confusion   HPI: 85 year old woman from community with past medical history as reviewed in the EMR was brought to the ER after she was found confused at home.  Patient unable to provide any history.  As per daughter by bedside patient could not recognize her when she came to help other sister at home.  Patient was found to be confused and paranoid.  Family tried melatonin to help her with sleep but patient stayed confused despite that.  As per daughter as her symptoms started after initiation of 60 mg of prednisone 4 days ago Telephyscian for pnuemonia.  She is not complaining of any headache, blurring of vision, chest pain, abdominal pain dysuria and diarrhea.  No travel history no known sick contact COVID-19 patient.  In the ED Pt  was found to have low-grade fever of 100.1, labs with elevated bicarb 33, no leukocytosis, lactate of 1.4, ABG with hypercapnia which is chronic, chest x-ray impressive of Stable right lower lobe pneumonia or atelectasis. 2. Decreased right pleural fluid.  Urine unremarkable.  As she was still confused decision to admit the patient was made for monitoring her mental status.   Review of Systems: As per HPI all other systems reviewed and negative.  Past Medical History:  Diagnosis Date   Cancer [facility])    colon   Cancer of cervix ([place])    cervix   COPD (chronic obstructive pulmonary disease) ([place])    Diabetes mellitus without complication ([place])    Gallstones    Gout    Hard of hearing    On home O2    Osteoporosis    Pleural effusion    Raynaud's disease     Past Surgical History:  Procedure  Laterality Date   ABDOMINAL HYSTERECTOMY     for cervical cancer 1965   COLON SURGERY     hemicolectomy 1998   gallstone surgery     ERCP for gallstone pancreatitis   ORTHOPEDIC SURGERY       reports that she quit smoking about 24 years ago. Her smoking use included cigarettes. She has a 70.00 pack-year smoking history. She has never used smokeless tobacco. She reports that she does not drink alcohol or use drugs.  No Known Allergies  Family History  Problem Relation Age of Onset   Diabetes Sister    Diabetes Sister    Diabetes Sister    Hypertension Mother    Heart disease Mother    Diabetes Sister     Acceptable: Family history reviewed and not pertinent (If you reviewed it)  Prior to Admission medications   Medication Sig Start Date End Date Taking? Authorizing Provider  ANORO ELLIPTA 62.5-25 MCG/INH AEPB TAKE 1 PUFF BY MOUTH EVERY DAY 12/09/18  Yes Juanito Doom, MD  furosemide (LASIX) 40 MG tablet Take 1 tablet (40 mg total) by mouth daily as needed for fluid. 07/03/19  Yes [name], MD  gabapentin (NEURONTIN) 100 MG capsule Take 200 mg by mouth at bedtime as needed.    Yes [provider]  Ipratropium-Albuterol (COMBIVENT RESPIMAT) 20-100 MCG/ACT AERS respimat Inhale 1 puff into the lungs every 4 (four) hours as  needed for wheezing. 05/12/19  Yes [name], MD  levofloxacin (LEVAQUIN) 500 MG tablet Take 1 tablet (500 mg total) by mouth daily. 07/23/19  Yes [name], MD  meloxicam (MOBIC) 7.5 MG tablet Take 1 tablet (7.5 mg total) by mouth daily. 07/09/19  Yes [name], MD  ondansetron (ZOFRAN ODT) 4 MG disintegrating tablet Take 1 tablet (4 mg total) by mouth every 8 (eight) hours as needed for nausea or vomiting. 06/03/19  Yes Robinson, [place] N, PA-C  pantoprazole (PROTONIX) 40 MG tablet Take 1 tablet (40 mg total) by mouth daily. 06/09/19  Yes [name], MD  predniSONE (DELTASONE) 20 MG tablet Take 3 tablets (60  mg total) by mouth daily with breakfast. 07/23/19  Yes Pickard, Cammie Mcgee, MD  Balsam [place]-Castor Oil [facility]) OINT Apply to bilateral buttocks and sacrum qshift and prn for shear to buttocks and prevention. Every shift    [provider]  oxyCODONE-acetaminophen (PERCOCET) 5-325 MG tablet Take 1 tablet by mouth every 4 (four) hours as needed for severe pain. 06/09/19   [name], MD  OXYGEN Inhale 3 L into the lungs continuous. [employer]    [provider]  Respiratory Therapy Supplies (FLUTTER) DEVI Use as directed. 10/01/18   Parrett, Fonnie Mu, NP  traMADol (ULTRAM) 50 MG tablet TAKE 1 TABLET (50 MG TOTAL) BY MOUTH EVERY 8 (EIGHT) HOURS AS NEEDED FOR UP TO 5 DAYS. 06/15/19   [provider]    Physical Exam: Vitals:   07/26/19 2037 07/26/19 2043 07/26/19 2100  BP:  123/67 134/62  Pulse:  71 67  Resp:  18 19  Temp:  100.1 F (37.8 C)   TempSrc:  Rectal   SpO2:   100%  Weight: 73.5 kg    Height: 5\' 4"  (1.626 m)      Constitutional: NAD, calm, comfortable Vitals:   07/26/19 2037 07/26/19 2043 07/26/19 2100  BP:  123/67 134/62  Pulse:  71 67  Resp:  18 19  Temp:  100.1 F (37.8 C)   TempSrc:  Rectal   SpO2:   100%  Weight: 73.5 kg    Height: 5\' 4"  (1.626 m)       Eyes: PERRL, lids and conjunctivae normal ENMT: Mucous membranes are moist. Posterior pharynx clear of any exudate or lesions.Normal dentition.  Neck: normal, supple, no masses, no thyromegaly Respiratory: right side with coarse breath sounds left clear ,Normal respiratory effort. No accessory muscle use.  Cardiovascular: Regular rate and rhythm, no murmurs / rubs / gallops. No extremity edema. 2+ pedal pulses. No carotid bruits.  Abdomen: no tenderness, no masses palpated. No hepatosplenomegaly. Bowel sounds positive.  Musculoskeletal: no clubbing / cyanosis. No joint deformity upper and lower extremities. Good ROM, no contractures. Normal muscle tone.  Skin: no rashes, lesions,  ulcers. No induration Neurologic: CN 2-12 grossly intact. Sensation intact, DTR normal. Strength 5/5 in all 4.  Psychiatric: Normal judgment and insight. Alert and oriented x 3. Normal mood.       Labs on Admission: I have personally reviewed following labs and imaging studies  CBC: Recent Labs  Lab 07/26/19 2125  WBC 5.8  NEUTROABS 4.9  HGB 10.1*  HCT 33.7*  MCV 91.8  PLT 99991111   Basic Metabolic Panel: Recent Labs  Lab 07/26/19 2125  NA 134*  K 4.1  CL 92*  CO2 33*  GLUCOSE 173*  BUN 24*  CREATININE 1.03*  CALCIUM 8.2*   GFR: Estimated Creatinine Clearance: 38.5  mL/min (A) (by C-G formula based on SCr of 1.03 mg/dL (H)). Liver Function Tests: Recent Labs  Lab 07/26/19 2125  AST 16  ALT 11  ALKPHOS 49  BILITOT 0.5  PROT 6.2*  ALBUMIN 3.4*   No results for input(s): LIPASE, AMYLASE in the last 168 hours. No results for input(s): AMMONIA in the last 168 hours. Coagulation Profile: No results for input(s): INR, PROTIME in the last 168 hours. Cardiac Enzymes: No results for input(s): CKTOTAL, CKMB, CKMBINDEX, TROPONINI in the last 168 hours. BNP (last 3 results) No results for input(s): PROBNP in the last 8760 hours. HbA1C: No results for input(s): HGBA1C in the last 72 hours. CBG: No results for input(s): GLUCAP in the last 168 hours. Lipid Profile: No results for input(s): CHOL, HDL, LDLCALC, TRIG, CHOLHDL, LDLDIRECT in the last 72 hours. Thyroid Function Tests: No results for input(s): TSH, T4TOTAL, FREET4, T3FREE, THYROIDAB in the last 72 hours. Anemia Panel: No results for input(s): VITAMINB12, FOLATE, FERRITIN, TIBC, IRON, RETICCTPCT in the last 72 hours. Urine analysis:    Component Value Date/Time   COLORURINE STRAW (A) 07/26/2019 2300   APPEARANCEUR CLEAR 07/26/2019 2300   LABSPEC 1.005 07/26/2019 2300   PHURINE 7.0 07/26/2019 2300   GLUCOSEU NEGATIVE 07/26/2019 2300   HGBUR NEGATIVE 07/26/2019 2300   BILIRUBINUR NEGATIVE 07/26/2019 2300    KETONESUR NEGATIVE 07/26/2019 2300   PROTEINUR NEGATIVE 07/26/2019 2300   UROBILINOGEN 0.2 07/11/2015 1149   NITRITE NEGATIVE 07/26/2019 2300   LEUKOCYTESUR NEGATIVE 07/26/2019 2300    Radiological Exams on Admission: Dg Chest Port 1 View  Result Date: 07/26/2019 CLINICAL DATA:  Cough. Low-grade fever. Altered mental status. Possible urinary tract infection. EXAM: PORTABLE CHEST 1 VIEW COMPARISON:  02/09/2019 FINDINGS: Normal sized heart. Mildly tortuous aorta. Continued patient rotation to the right. Ill-defined increased density is again demonstrated at the right lung base with decreased pleural fluid. Clear left lung. Diffuse osteopenia. IMPRESSION: 1. Stable right lower lobe pneumonia or atelectasis. 2. Decreased right pleural fluid. 3. No new abnormality. Electronically Signed   By: Claudie Revering M.D.   On: 07/26/2019 21:46    EKG: Independently reviewed.  Assessment/Plan Principal Problem:   AMS (altered mental status) Active Problems:   Hyperlipidemia   HTN (hypertension)   COPD, severe (HCC)   RLL pneumonia  (please populate well all problems here in Problem List. (For example, if patient is on BP meds at home and you resume or decide to hold them, it is a problem that needs to be her. Same for CAD, COPD, HLD and so on)  AMS (altered mental status) : Was brought to the ED for confusion and urinary frequency symptomatic since initiation of prednisone 60 mg 4 days ago, was found to have low-grade fever of 100.1, labs with elevated bicarb 33, no leukocytosis, lactate of 1.4, ABG with hypercapnia which is chronic, chest x-ray impressive of Stable right lower lobe pneumonia or atelectasis. 2. Decreased right pleural fluid.  Urine unremarkable.  Patient is on multiple medication including Percocet, tramadol, melatonin, gabapentin and recent use of steroids.  Current presentation could be secondary to polypharmacy versus ongoing resolving infection(pneumonia)  -Admitted the patient under  observation status to monitor fever curve and white count -Follow blood cultures as patient was febrile to 100.1 -Holding pain medication hypnotics as they may be causing the symptoms -Substituted Levaquin with Ceftriaxone 1g daily,  for ongoing right lower lobe pneumonia. As Levaquin can also cause change in mental status after the first dose. . -Continue  to monitor saturation, continue with 3 L nasal cannula.  Monitor patient clinically for resolution of symptoms.  Hyperlipidemia : Not on statins, continue to manage with low fat diet.     HTN (hypertension) : BP stable on this admission, Is on lasix but not on antihypertensives. Will continue to monitor blood pressure.    COPD, severe :   History of COPD is on home oxygen 3 L via nasal cannula.  Not in exacerbation at this admission.  We will continue with her home inhalers.  For uncontrolled symptoms will use nebulizer if needed.  RLL pneumonia : Was diagnosed with a right lower lobe pneumonia imaging finding has been persistent since April no new finding x-ray on this admission with stable right lower lobe pneumonia.  Patient was started on levofloxacin in outpatient setting is on day 4, as it could be causing symptoms will place the pt on Ceftriaxone 1 g daily.    DVT prophylaxis: Hep SQ Code Status: DNR   Family Communication: Daughter by bedside  Disposition Plan: Home  Consults called: None  Admission status: Obs   I certify that at the point of admission it is my clinical judgment that the patient will require inpatient hospital care spanning beyond 2 midnights from the point of admission due to high intensity of service, high risk for further deterioration and high frequency of surveillance required. The following factors support the patient status of inpatient:    Oran Rein MD [facility]  07/26/2019, 11:25 PM

## 2019-07-27 DIAGNOSIS — J449 Chronic obstructive pulmonary disease, unspecified: Secondary | ICD-10-CM

## 2019-07-27 DIAGNOSIS — R4182 Altered mental status, unspecified: Secondary | ICD-10-CM | POA: Diagnosis not present

## 2019-07-27 DIAGNOSIS — R404 Transient alteration of awareness: Secondary | ICD-10-CM | POA: Diagnosis not present

## 2019-07-27 LAB — GLUCOSE, CAPILLARY
Glucose-Capillary: 94 mg/dL (ref 70–99)
Glucose-Capillary: 102 mg/dL — ABNORMAL HIGH (ref 70–99)

## 2019-07-27 LAB — SARS CORONAVIRUS 2 (TAT 6-24 HRS): SARS Coronavirus 2: NEGATIVE

## 2019-07-27 MED ORDER — ENSURE ENLIVE PO LIQD
237.0000 mL | Freq: Two times a day (BID) | ORAL | Status: DC
  Administered 2019-07-27 – 2019-07-28 (×2): 237 mL via ORAL

## 2019-07-27 MED ORDER — HEPARIN SODIUM (PORCINE) 5000 UNIT/ML IJ SOLN
5000.0000 [IU] | Freq: Three times a day (TID) | INTRAMUSCULAR | Status: DC
  Administered 2019-07-27 – 2019-07-28 (×3): 5000 [IU] via SUBCUTANEOUS
  Filled 2019-07-27 (×3): qty 1

## 2019-07-27 MED ORDER — LEVOFLOXACIN 500 MG PO TABS: 500.0000 mg | ORAL_TABLET | Freq: Every day | ORAL | Status: DC

## 2019-07-27 MED ORDER — SODIUM CHLORIDE 0.9 % IV SOLN
1.0000 g | INTRAVENOUS | Status: DC
  Administered 2019-07-27 – 2019-07-28 (×2): 1 g via INTRAVENOUS
  Filled 2019-07-27 (×2): qty 10

## 2019-07-27 MED ORDER — UMECLIDINIUM-VILANTEROL 62.5-25 MCG/INH IN AEPB
1.0000 | INHALATION_SPRAY | Freq: Every day | RESPIRATORY_TRACT | Status: DC
  Administered 2019-07-27 – 2019-07-28 (×2): 1 via RESPIRATORY_TRACT
  Filled 2019-07-27: qty 14

## 2019-07-27 MED ORDER — HALOPERIDOL 0.5 MG PO TABS
0.5000 mg | ORAL_TABLET | Freq: Once | ORAL | Status: DC
  Filled 2019-07-27: qty 1

## 2019-07-27 MED ORDER — PANTOPRAZOLE SODIUM 40 MG PO TBEC
40.0000 mg | DELAYED_RELEASE_TABLET | Freq: Every day | ORAL | Status: DC
  Administered 2019-07-27 – 2019-07-28 (×2): 40 mg via ORAL
  Filled 2019-07-27 (×2): qty 1

## 2019-07-27 MED ORDER — SODIUM CHLORIDE 0.9 % IV SOLN
500.0000 mg | INTRAVENOUS | Status: DC
  Administered 2019-07-27 – 2019-07-28 (×2): 500 mg via INTRAVENOUS
  Filled 2019-07-27 (×2): qty 500

## 2019-07-27 MED ORDER — IPRATROPIUM-ALBUTEROL 0.5-2.5 (3) MG/3ML IN SOLN: 3.0000 mL | RESPIRATORY_TRACT | Status: DC | PRN

## 2019-07-27 NOTE — Progress Notes (Signed)
[place]oro DPI unavailable from pharmacy at [phone].

## 2019-07-27 NOTE — Progress Notes (Signed)
Initial Nutrition Assessment  DOCUMENTATION CODES:      INTERVENTION:  Ensure Enlive po BID, each supplement provides 350 kcal and 20 grams of protein    NUTRITION DIAGNOSIS:   Inadequate oral intake related to acute illness(delirium, pneumonia) as evidenced by meal completion < 50%.   GOAL:  Patient will meet greater than or equal to 90% of their needs   MONITOR:   PO intake, Weight trends, Labs, Supplement acceptance  REASON FOR ASSESSMENT:   Malnutrition Screening Tool    ASSESSMENT: Patient is an 85 yo female with history of COPD, DM , Raynaud's disease and gallstones. She presents with delirium and pneumonia.  Patient says her appetite is not what is used to be. She was able to eat ~50% of breakfast and lunch tray is here untouched. Feeds herself. Denies wt changes. Usual wt self-reported 162 lb (73.6 kg)and she is currently 167 lb (75.9 kg).  Medications reviewed and include: Protonix, zithromax, rocephin  Labs: BMP Latest Ref Rng & Units 07/26/2019 07/09/2019 06/09/2019  Glucose 70 - 99 mg/dL 173(H) 185(H) 136(H)  BUN 8 - 23 mg/dL 24(H) 23 22  Creatinine 0.44 - 1.00 mg/dL 1.03(H) 0.98(H) 0.93(H)  BUN/Creat Ratio 6 - 22 (calc) - 23(H) 24(H)  Sodium 135 - 145 mmol/L 134(L) 143 141  Potassium 3.5 - 5.1 mmol/L 4.1 4.1 4.6  Chloride 98 - 111 mmol/L 92(L) 97(L) 99  CO2 22 - 32 mmol/L 33(H) 37(H) 35(H)  Calcium 8.9 - 10.3 mg/dL 8.2(L) 8.3(L) 8.9     NUTRITION - FOCUSED PHYSICAL EXAM: Nutrition-Focused physical exam findings: no fat depletion and no edema.     Diet Order:   Diet Order            Diet 2 gram sodium Room service appropriate? Yes; Fluid consistency: Thin  Diet effective now              EDUCATION NEEDS:   Education needs have been addressed   Skin:  Skin Assessment: Reviewed RN Assessment  Last BM:  10/5 type 6  Height:   Ht Readings from Last 1 Encounters:  07/27/19 5\' 4"  (1.626 m)    Weight:   Wt Readings from Last 1 Encounters:   07/27/19 76.1 kg    Ideal Body Weight:   55 kg  BMI:  Body mass index is 28.8 kg/m.  Estimated Nutritional Needs:   Kcal:  1427-1546 (MSJ x 1.2-1.3 AF)  Protein:  66-77 gr(1.2-1.3 gr/kg/ibw)  Fluid:  1.4-1.5 liters daily   Colman Cater MS,RD,CSG,LDN Office: [phone] Pager: [phone]

## 2019-07-27 NOTE — Progress Notes (Signed)
Patient Demographics:    Emily Owen, is a 85 y.o. female, DOB - 1930-03-30, [id number]  Admit date - 07/26/2019   Admitting Physician Oran Rein, MD  Outpatient Primary MD for the patient is Pickard, Cammie Mcgee, MD  LOS - 0   Chief Complaint  Patient presents with  . Altered Mental Status        Subjective:    Emily Owen today has no fevers, no emesis,  No chest pain, some cough persist, remains confused and disoriented, requiring up to 4 L of oxygen, PTA was on 3 L of oxygen via nasal cannula  Assessment  & Plan :    Principal Problem:   AMS (altered mental status) Active Problems:   Hyperlipidemia   HTN (hypertension)   COPD, severe (HCC)   RLL pneumonia   Altered mental status   1)Rt Sided PNA--- some cough persist some oxygen requirement persist continue bronchodilators continue azithromycin/Rocephin -Partially treated with Levaquin PTA -requiring up to 4 L of oxygen, PTA was on 3 L of oxygen via nasal cannula  2) acute metabolic encephalopathy-  patient remains confused and disoriented,  -was brought to the ED for confusion .  ??  Query due to polypharmacy including opiates, query due to recent steroid use, query due to pneumonia/infection    COPD, severe :   History of COPD is on home oxygen 3 L via nasal cannula.  -Continue supplemental oxygen bronchodilators  Disposition/Need for in-Hospital Stay- patient unable to be discharged at this time due to --pending improvement in respiratory status including hypoxia, pending physical therapy evaluation and pending improvement in mentation* -equiring up to 4 L of oxygen, PTA was on 3 L of oxygen via nasal cannula  Code Status : full Family Communication:  daughter  Disposition Plan  :  physical therapy eval  DVT Prophylaxis  :    Heparin - SCDs *  Lab Results  Component Value Date   PLT 238 07/26/2019    Inpatient  Medications  Scheduled Meds: . feeding supplement (ENSURE ENLIVE)  237 mL Oral BID BM  . heparin  5,000 Units Subcutaneous Q8H  . pantoprazole  40 mg Oral Daily  . umeclidinium-vilanterol  1 puff Inhalation Daily   Continuous Infusions: . azithromycin 500 mg (07/27/19 1045)  . cefTRIAXone (ROCEPHIN)  IV 1 g (07/27/19 0929)   PRN Meds:.ipratropium-albuterol    Anti-infectives (From admission, onward)   Start     Dose/Rate Route Frequency Ordered Stop   07/27/19 1000  levofloxacin (LEVAQUIN) tablet 500 mg  Status:  Discontinued     500 mg Oral Daily 07/27/19 0108 07/27/19 0203   07/27/19 0900  azithromycin (ZITHROMAX) 500 mg in sodium chloride 0.9 % 250 mL IVPB     500 mg 250 mL/hr over 60 Minutes Intravenous Every 24 hours 07/27/19 0829     07/27/19 0800  cefTRIAXone (ROCEPHIN) 1 g in sodium chloride 0.9 % 100 mL IVPB     1 g 200 mL/hr over 30 Minutes Intravenous Every 24 hours 07/27/19 0203 07/30/19 0759        Objective:   Vitals:   07/27/19 0116 07/27/19 0459 07/27/19 1218 07/27/19 1400  BP: 125/76 114/70  (!) 121/54  Pulse: 73 70  63  Resp:  20 16  16   Temp: (!) 97.5 F (36.4 C) (!) 97.5 F (36.4 C)    TempSrc: Oral Oral    SpO2: 94% 99% 99% 100%  Weight: 76.1 kg     Height: 5\' 4"  (1.626 m)       Wt Readings from Last 3 Encounters:  07/27/19 76.1 kg  07/09/19 73.5 kg  06/03/19 77.1 kg     Intake/Output Summary (Last 24 hours) at 07/27/2019 1904 Last data filed at 07/27/2019 1858 Gross per 24 hour  Intake 740 ml  Output 800 ml  Net -60 ml     Physical Exam  Gen:- Awake , confusional episodes are disorientation persist HEENT:- [place].AT, No sclera icterus Nose- [place] 4 L.min Neck-Supple Neck,No JVD,.  Lungs-diminished in right lung base, no wheezing CV- S1, S2 normal, regular  Abd-  +ve B.Sounds, Abd Soft, No tenderness,    Extremity/Skin:- No  edema, pedal pulses present  Psych-affect is confused and disoriented from time to time neuro-generalized  weakness .no new focal deficits, no tremors   Data Review:   Micro Results Recent Results (from the past 240 hour(s))  Culture, blood (routine x 2)     Status: None (Preliminary result)   Collection Time: 07/26/19  9:24 PM   Specimen: BLOOD RIGHT ARM  Result Value Ref Range Status   Specimen Description BLOOD RIGHT ARM  Final   Special Requests   Final    BOTTLES DRAWN AEROBIC AND ANAEROBIC Blood Culture adequate volume   Culture   Final    NO GROWTH < 12 HOURS Performed at [facility], [address] [place], [place] [address]    Report Status PENDING  Incomplete  SARS CORONAVIRUS 2 (TAT 6-24 HRS) Nasopharyngeal Nasopharyngeal Swab     Status: None   Collection Time: 07/26/19  9:30 PM   Specimen: Nasopharyngeal Swab  Result Value Ref Range Status   SARS Coronavirus 2 NEGATIVE NEGATIVE Final    Comment: (NOTE) SARS-CoV-2 target nucleic acids are NOT DETECTED. The SARS-CoV-2 RNA is generally detectable in upper and lower respiratory specimens during the acute phase of infection. Negative results do not preclude SARS-CoV-2 infection, do not rule out co-infections with other pathogens, and should not be used as the sole basis for treatment or other patient management decisions. Negative results must be combined with clinical observations, patient history, and epidemiological information. The expected result is Negative. Fact Sheet for Patients: SugarRoll.be Fact Sheet for Healthcare Providers: https://www.woods-mathews.com/ This test is not yet approved or cleared by the [place] FDA and  has been authorized for detection and/or diagnosis of SARS-CoV-2 by FDA under an Emergency Use Authorization (EUA). This EUA will remain  in effect (meaning this test can be used) for the duration of the COVID-19 declaration under Section 56 4(b)(1) of the Act, 21 U.S.C. section 360bbb-3(b)(1), unless the authorization is terminated or revoked  sooner. Performed at [facility], [place] [address] [place], [place] [address]   Culture, blood (routine x 2)     Status: None (Preliminary result)   Collection Time: 07/26/19 10:03 PM   Specimen: Left Antecubital; Blood  Result Value Ref Range Status   Specimen Description LEFT ANTECUBITAL  Final   Special Requests   Final    BOTTLES DRAWN AEROBIC AND ANAEROBIC Blood Culture adequate volume   Culture   Final    NO GROWTH < 12 HOURS Performed at [facility], [address] [place], [place] [address]    Report Status PENDING  Incomplete    Radiology Reports Dg Chest Port 1 View  Result Date: 07/26/2019 CLINICAL DATA:  Cough. Low-grade fever. Altered mental status. Possible urinary tract infection. EXAM: PORTABLE CHEST 1 VIEW COMPARISON:  02/09/2019 FINDINGS: Normal sized heart. Mildly tortuous aorta. Continued patient rotation to the right. Ill-defined increased density is again demonstrated at the right lung base with decreased pleural fluid. Clear left lung. Diffuse osteopenia. IMPRESSION: 1. Stable right lower lobe pneumonia or atelectasis. 2. Decreased right pleural fluid. 3. No new abnormality. Electronically Signed   By: Claudie Revering M.D.   On: 07/26/2019 21:46     CBC Recent Labs  Lab 07/26/19 2125  WBC 5.8  HGB 10.1*  HCT 33.7*  PLT 238  MCV 91.8  MCH 27.5  MCHC 30.0  RDW 15.5  LYMPHSABS 0.5*  MONOABS 0.3  EOSABS 0.0  BASOSABS 0.0    Chemistries  Recent Labs  Lab 07/26/19 2125  NA 134*  K 4.1  CL 92*  CO2 33*  GLUCOSE 173*  BUN 24*  CREATININE 1.03*  CALCIUM 8.2*  AST 16  ALT 11  ALKPHOS 49  BILITOT 0.5   ------------------------------------------------------------------------------------------------------------------ No results for input(s): CHOL, HDL, LDLCALC, TRIG, CHOLHDL, LDLDIRECT in the last 72 hours.  Lab Results  Component Value Date   HGBA1C 6.3 (H) 02/26/2018    ------------------------------------------------------------------------------------------------------------------ No results for input(s): TSH, T4TOTAL, T3FREE, THYROIDAB in the last 72 hours.  Invalid input(s): FREET3 ------------------------------------------------------------------------------------------------------------------ No results for input(s): VITAMINB12, FOLATE, FERRITIN, TIBC, IRON, RETICCTPCT in the last 72 hours.  Coagulation profile No results for input(s): INR, PROTIME in the last 168 hours.  No results for input(s): DDIMER in the last 72 hours.  Cardiac Enzymes No results for input(s): CKMB, TROPONINI, MYOGLOBIN in the last 168 hours.  Invalid input(s): CK ------------------------------------------------------------------------------------------------------------------    Component Value Date/Time   BNP 38 07/09/2019 0920     Jamen Loiseau M.D on 07/27/2019 at 7:04 PM  Go to [email or web address] - for contact info  [facility] - Office  [phone]

## 2019-07-27 NOTE — Care Management Obs Status (Signed)
[place] NOTIFICATION   Patient Details  Name: Emily Owen MRN: [id number] Date of Birth: 1929/12/07   Medicare Observation Status Notification Given:  Yes(spoke with daughter by telephone, a copy will be placed in room by RN due to contact precautions)    Tommy Medal 07/27/2019, 4:24 PM

## 2019-07-27 NOTE — Plan of Care (Signed)
Pt transferred to med/surg unit at this time.

## 2019-07-28 DIAGNOSIS — R4182 Altered mental status, unspecified: Secondary | ICD-10-CM | POA: Diagnosis not present

## 2019-07-28 DIAGNOSIS — J449 Chronic obstructive pulmonary disease, unspecified: Secondary | ICD-10-CM | POA: Diagnosis not present

## 2019-07-28 DIAGNOSIS — R69 Illness, unspecified: Secondary | ICD-10-CM | POA: Diagnosis not present

## 2019-07-28 DIAGNOSIS — R404 Transient alteration of awareness: Secondary | ICD-10-CM | POA: Diagnosis not present

## 2019-07-28 LAB — URINE CULTURE: Culture: NO GROWTH

## 2019-07-28 LAB — GLUCOSE, CAPILLARY
Glucose-Capillary: 101 mg/dL — ABNORMAL HIGH (ref 70–99)
Glucose-Capillary: 95 mg/dL (ref 70–99)
Glucose-Capillary: 145 mg/dL — ABNORMAL HIGH (ref 70–99)

## 2019-07-28 MED ORDER — ONDANSETRON 4 MG PO TBDP
4.0000 mg | ORAL_TABLET | Freq: Once | ORAL | Status: AC
  Administered 2019-07-28: 4 mg via ORAL
  Filled 2019-07-28: qty 1

## 2019-07-28 MED ORDER — CEFDINIR 300 MG PO CAPS: 300.0000 mg | ORAL_CAPSULE | Freq: Two times a day (BID) | ORAL | 0 refills | Status: DC

## 2019-07-28 MED ORDER — DULOXETINE HCL 20 MG PO CPEP: 20.0000 mg | ORAL_CAPSULE | Freq: Two times a day (BID) | ORAL | 3 refills | Status: DC

## 2019-07-28 MED ORDER — CITALOPRAM HYDROBROMIDE 20 MG PO TABS: 20.0000 mg | ORAL_TABLET | Freq: Every day | ORAL | Status: DC

## 2019-07-28 MED ORDER — ALBUTEROL SULFATE (2.5 MG/3ML) 0.083% IN NEBU: 2.5000 mg | INHALATION_SOLUTION | Freq: Four times a day (QID) | RESPIRATORY_TRACT | 12 refills | Status: AC | PRN

## 2019-07-28 MED ORDER — AZITHROMYCIN 500 MG PO TABS: 500.0000 mg | ORAL_TABLET | Freq: Every day | ORAL | 0 refills | Status: DC

## 2019-07-28 MED ORDER — DULOXETINE HCL 20 MG PO CPEP
20.0000 mg | ORAL_CAPSULE | Freq: Two times a day (BID) | ORAL | Status: DC
  Administered 2019-07-28: 20 mg via ORAL
  Filled 2019-07-28: qty 1

## 2019-07-28 NOTE — Discharge Summary (Signed)
Emily Owen, is a 85 y.o. female  DOB 11-09-1929  MRN [id number].  Admission date:  07/26/2019  Admitting Physician  Oran Rein, MD  Discharge Date:  07/28/2019   Primary MD  [name], MD  Recommendations for primary care physician for things to follow:  - 1) you have refused home health physical therapy, you have refused home health services 2) follow-up with the primary care physician in 1 to 2 weeks for recheck and reevaluation 3) please take medications as prescribed 4) use wall call for ambulation and observe fall precautions  Admission Diagnosis  Dehydration [E86.0] Delirium [R41.0]   Discharge Diagnosis  Dehydration [E86.0] Delirium [R41.0]    Principal Problem:   AMS (altered mental status) Active Problems:   Hyperlipidemia   HTN (hypertension)   COPD, severe (HCC)   RLL pneumonia   Altered mental status      Past Medical History:  Diagnosis Date  . Cancer ([place])    colon  . Cancer of cervix ([place])    cervix  . COPD (chronic obstructive pulmonary disease) ([place])   . Diabetes mellitus without complication ([place])   . Gallstones   . Gout   . Hard of hearing   . On home O2   . Osteoporosis   . Pleural effusion   . Raynaud's disease     Past Surgical History:  Procedure Laterality Date  . ABDOMINAL HYSTERECTOMY     for cervical cancer 1965  . COLON SURGERY     hemicolectomy 1998  . gallstone surgery     ERCP for gallstone pancreatitis  . ORTHOPEDIC SURGERY         HPI  from the history and physical done on the day of admission:    - Chief Complaint: Confusion   HPI: 85 year old woman from community with past medical history as reviewed in the EMR was brought to the ER after she was found confused at home.  Patient unable to provide any history.  As per daughter by bedside patient could not recognize her when she came to help other sister at home.   Patient was found to be confused and paranoid.  Family tried melatonin to help her with sleep but patient stayed confused despite that.  As per daughter as her symptoms started after initiation of 60 mg of prednisone 4 days ago Telephyscian for pnuemonia.  She is not complaining of any headache, blurring of vision, chest pain, abdominal pain dysuria and diarrhea.  No travel history no known sick contact COVID-19 patient.  In the ED Pt  was found to have low-grade fever of 100.1, labs with elevated bicarb 33, no leukocytosis, lactate of 1.4, ABG with hypercapnia which is chronic, chest x-ray impressive of Stable right lower lobe pneumonia or atelectasis. 2. Decreased right pleural fluid.  Urine unremarkable.  As she was still confused decision to admit the patient was made for monitoring her mental status.   Review of Systems: As per HPI all other systems reviewed and negative.   Hospital  Course:        -1)Rt Sided PNA---  cough and shortness of breath is improved, -Oxygen requirement is at baseline which is 3 L/min -treated with IV azithromycin/Rocephin, will discharge on p.o. azithromycin and Omnicef -Partially treated with Levaquin PTA -Continue bronchodilators  2) acute metabolic encephalopathy- -mental status back to baseline -Patient's 2 daughters Pamela/Nikki and [name] confirms that patient's mental status is back to her baseline -Suspect metabolic encephalopathy was secondary to steroid-induced psychosis compounded by opiate use and pneumonia/infection -Metabolic encephalopathy is resolved at this time -Patient is alert and oriented x3, she is coherent  3)COPD, severe:History of COPD is on home oxygen 3 L via nasal cannula. -Continue supplemental oxygen bronchodilators  4) depression and chronic pain concerns--both daughters [name] and Pamela/Nikki expressed concern about depression and chronic pain, will try Cymbalta, patient follow-up with PCP for further management -   Disposition--patient and family declined home health services including home health PT and RN -Apparently patient is very very noncompliant, patient apparently daughter is unaware she wants to do not what is recommended as per her daughters - Code Status : full Family Communication:  daughters x 2 (personally spoke with patient 2 daughters [name] and Pamela/Nikki, questions answered  Discharge Condition: Stable  Follow UP  Diet and Activity recommendation:  As advised  Discharge Instructions    Discharge Instructions    Call MD for:  difficulty breathing, headache or visual disturbances   Complete by: As directed    Call MD for:  extreme fatigue   Complete by: As directed    Call MD for:  persistant dizziness or light-headedness   Complete by: As directed    Call MD for:  persistant nausea and vomiting   Complete by: As directed    Call MD for:  severe uncontrolled pain   Complete by: As directed    Call MD for:  temperature >100.4   Complete by: As directed    Diet - low sodium heart healthy   Complete by: As directed    Discharge instructions   Complete by: As directed    1) you have refused home health physical therapy, you have refused home health services 2) follow-up with the primary care physician in 1 to 2 weeks for recheck and reevaluation 3) please take medications as prescribed 4) use wall call for ambulation and observe fall precautions   For home use only DME Nebulizer machine   Complete by: As directed    Patient needs a nebulizer to treat with the following condition: Dyspnea and respiratory abnormalities   Length of Need: Lifetime   Increase activity slowly   Complete by: As directed    Use walker for ambulation -Fall precautions        Discharge Medications     Allergies as of 07/28/2019   No Known Allergies     Medication List    STOP taking these medications   Combivent Respimat 20-100 MCG/ACT Aers respimat Generic drug:  Ipratropium-Albuterol   levofloxacin 500 MG tablet Commonly known as: LEVAQUIN   predniSONE 20 MG tablet Commonly known as: DELTASONE     TAKE these medications   albuterol (2.5 MG/3ML) 0.083% nebulizer solution Commonly known as: PROVENTIL Take 3 mLs (2.5 mg total) by nebulization every 6 (six) hours as needed for wheezing or shortness of breath.   Anoro Ellipta 62.5-25 MCG/INH Aepb Generic drug: umeclidinium-vilanterol TAKE 1 PUFF BY MOUTH EVERY DAY   azithromycin 500 MG tablet Commonly known as: ZITHROMAX Take 1 tablet (  500 mg total) by mouth daily for 3 days.   cefdinir 300 MG capsule Commonly known as: OMNICEF Take 1 capsule (300 mg total) by mouth 2 (two) times daily for 5 days.   DULoxetine 20 MG capsule Commonly known as: CYMBALTA Take 1 capsule (20 mg total) by mouth 2 (two) times daily.   Flutter Devi Use as directed.   furosemide 40 MG tablet Commonly known as: LASIX TAKE 1 TABLET (40 MG TOTAL) BY MOUTH DAILY AS NEEDED FOR FLUID.   gabapentin 100 MG capsule Commonly known as: NEURONTIN Take 200 mg by mouth at bedtime as needed.   meloxicam 7.5 MG tablet Commonly known as: MOBIC Take 1 tablet (7.5 mg total) by mouth daily.   ondansetron 4 MG disintegrating tablet Commonly known as: Zofran ODT Take 1 tablet (4 mg total) by mouth every 8 (eight) hours as needed for nausea or vomiting.   oxyCODONE-acetaminophen 5-325 MG tablet Commonly known as: Percocet Take 1 tablet by mouth every 4 (four) hours as needed for severe pain.   OXYGEN Inhale 3 L into the lungs continuous. [employer]   pantoprazole 40 MG tablet Commonly known as: PROTONIX Take 1 tablet (40 mg total) by mouth daily.   traMADol 50 MG tablet Commonly known as: ULTRAM TAKE 1 TABLET (50 MG TOTAL) BY MOUTH EVERY 8 (EIGHT) HOURS AS NEEDED FOR UP TO 5 DAYS.   Venelex Oint Apply to bilateral buttocks and sacrum qshift and prn for shear to buttocks and prevention. Every shift             Durable Medical Equipment  (From admission, onward)         Start     Ordered   07/28/19 1030  For home use only DME Nebulizer/meds  Once    Question Answer Comment  Patient needs a nebulizer to treat with the following condition Dyspnea   Length of Need Lifetime      07/28/19 1029   07/28/19 0000  For home use only DME Nebulizer machine    Question Answer Comment  Patient needs a nebulizer to treat with the following condition Dyspnea and respiratory abnormalities   Length of Need Lifetime      07/28/19 1044          Major procedures and Radiology Reports - PLEASE review detailed and final reports for all details, in brief -   Dg Chest Port 1 View  Result Date: 07/26/2019 CLINICAL DATA:  Cough. Low-grade fever. Altered mental status. Possible urinary tract infection. EXAM: PORTABLE CHEST 1 VIEW COMPARISON:  02/09/2019 FINDINGS: Normal sized heart. Mildly tortuous aorta. Continued patient rotation to the right. Ill-defined increased density is again demonstrated at the right lung base with decreased pleural fluid. Clear left lung. Diffuse osteopenia. IMPRESSION: 1. Stable right lower lobe pneumonia or atelectasis. 2. Decreased right pleural fluid. 3. No new abnormality. Electronically Signed   By: Claudie Revering M.D.   On: 07/26/2019 21:46    Micro Results    Recent Results (from the past 240 hour(s))  Culture, blood (routine x 2)     Status: None (Preliminary result)   Collection Time: 07/26/19  9:24 PM   Specimen: BLOOD RIGHT ARM  Result Value Ref Range Status   Specimen Description BLOOD RIGHT ARM  Final   Special Requests   Final    BOTTLES DRAWN AEROBIC AND ANAEROBIC Blood Culture adequate volume   Culture   Final    NO GROWTH < 12 HOURS Performed at [facility]  [facility], [address], [place], [place] [address]    Report Status PENDING  Incomplete  SARS CORONAVIRUS 2 (TAT 6-24 HRS) Nasopharyngeal Nasopharyngeal Swab     Status: None   Collection Time: 07/26/19  9:30 PM    Specimen: Nasopharyngeal Swab  Result Value Ref Range Status   SARS Coronavirus 2 NEGATIVE NEGATIVE Final    Comment: (NOTE) SARS-CoV-2 target nucleic acids are NOT DETECTED. The SARS-CoV-2 RNA is generally detectable in upper and lower respiratory specimens during the acute phase of infection. Negative results do not preclude SARS-CoV-2 infection, do not rule out co-infections with other pathogens, and should not be used as the sole basis for treatment or other patient management decisions. Negative results must be combined with clinical observations, patient history, and epidemiological information. The expected result is Negative. Fact Sheet for Patients: SugarRoll.be Fact Sheet for Healthcare Providers: https://www.woods-mathews.com/ This test is not yet approved or cleared by the [place] FDA and  has been authorized for detection and/or diagnosis of SARS-CoV-2 by FDA under an Emergency Use Authorization (EUA). This EUA will remain  in effect (meaning this test can be used) for the duration of the COVID-19 declaration under Section 56 4(b)(1) of the Act, 21 U.S.C. section 360bbb-3(b)(1), unless the authorization is terminated or revoked sooner. Performed at [facility], [place] [address], [place], [place] [address]   Culture, blood (routine x 2)     Status: None (Preliminary result)   Collection Time: 07/26/19 10:03 PM   Specimen: Left Antecubital; Blood  Result Value Ref Range Status   Specimen Description LEFT ANTECUBITAL  Final   Special Requests   Final    BOTTLES DRAWN AEROBIC AND ANAEROBIC Blood Culture adequate volume   Culture   Final    NO GROWTH < 12 HOURS Performed at [facility], [address] [place], [place] [address]    Report Status PENDING  Incomplete  Urine culture     Status: None   Collection Time: 07/26/19 11:00 PM   Specimen: Urine, Catheterized  Result Value Ref Range Status   Specimen  Description   Final    URINE, CATHETERIZED Performed at [facility], [address] [place], [place] [address]    Special Requests   Final    NONE Performed at [facility], [address] [place], [place] [address]    Culture   Final    NO GROWTH Performed at [facility], [place] [address], [place], [place] [address]    Report Status 07/28/2019 FINAL  Final       Today   Subjective    Rosamaria Zakarian today has no new complaints Cognitively appears back to baseline  -Alert and oriented requesting to be discharged home          Patient has been seen and examined prior to discharge   Objective   Blood pressure 126/62, pulse 64, temperature 98.1 F (36.7 C), temperature source Oral, resp. rate 18, height 5\' 4"  (1.626 m), weight 76.1 kg, SpO2 97 %.   Intake/Output Summary (Last 24 hours) at 07/28/2019 1045 Last data filed at 07/28/2019 0700 Gross per 24 hour  Intake 480 ml  Output 800 ml  Net -320 ml    Exam Gen:- Awake Alert, no acute distress  HEENT:- [place].AT, No sclera icterus Nose- [place] 3 L/min Neck-Supple Neck,No JVD,.  Lungs-improved air movement, no wheezing  CV- S1, S2 normal, regular Abd-  +ve B.Sounds, Abd Soft, No tenderness,    Extremity/Skin:- No  edema,   good pulses Psych-affect is appropriate, oriented x3 Neuro-no new focal deficits, no tremors    Data Review   CBC w Diff:  Lab Results  Component Value Date   WBC 5.8 07/26/2019   HGB 10.1 (L) 07/26/2019   HCT 33.7 (L) 07/26/2019   PLT 238 07/26/2019   LYMPHOPCT 8 07/26/2019   MONOPCT 6 07/26/2019   EOSPCT 0 07/26/2019   BASOPCT 0 07/26/2019    CMP:  Lab Results  Component Value Date   NA 134 (L) 07/26/2019   NA 143 07/25/2017   K 4.1 07/26/2019   CL 92 (L) 07/26/2019   CO2 33 (H) 07/26/2019   BUN 24 (H) 07/26/2019   BUN 18 07/25/2017   CREATININE 1.03 (H) 07/26/2019   CREATININE 0.98 (H) 07/09/2019   GLU 130 07/25/2017   PROT 6.2 (L) 07/26/2019   PROT 7.0 07/25/2017    ALBUMIN 3.4 (L) 07/26/2019   ALBUMIN 4.2 07/25/2017   BILITOT 0.5 07/26/2019   BILITOT 0.3 07/25/2017   ALKPHOS 49 07/26/2019   AST 16 07/26/2019   ALT 11 07/26/2019  .   Total Discharge time is about 33 minutes  Roxan Hockey M.D on 07/28/2019 at 10:45 AM  Go to [email or web address] -  for contact info  [facility] - Office  [phone]

## 2019-07-28 NOTE — Progress Notes (Signed)
Patient was given discharge instructions and stated she understands all information

## 2019-07-30 ENCOUNTER — Other Ambulatory Visit: Payer: Self-pay

## 2019-07-30 ENCOUNTER — Encounter: Payer: Self-pay | Admitting: Family Medicine

## 2019-07-30 ENCOUNTER — Ambulatory Visit (INDEPENDENT_AMBULATORY_CARE_PROVIDER_SITE_OTHER): Payer: Medicare HMO | Admitting: Family Medicine

## 2019-07-30 VITALS — BP 110/62 | HR 74 | Temp 97.9°F | Resp 18 | Ht 64.0 in

## 2019-07-30 DIAGNOSIS — J441 Chronic obstructive pulmonary disease with (acute) exacerbation: Secondary | ICD-10-CM

## 2019-07-30 DIAGNOSIS — Z09 Encounter for follow-up examination after completed treatment for conditions other than malignant neoplasm: Secondary | ICD-10-CM

## 2019-07-30 MED ORDER — GABAPENTIN 100 MG PO CAPS: 200.0000 mg | ORAL_CAPSULE | Freq: Every evening | ORAL | 1 refills | Status: DC | PRN

## 2019-07-30 MED ORDER — ESCITALOPRAM OXALATE 10 MG PO TABS: 10.0000 mg | ORAL_TABLET | Freq: Every day | ORAL | 5 refills | Status: DC

## 2019-07-30 MED ORDER — OXYCODONE-ACETAMINOPHEN 5-325 MG PO TABS: 1.0000 | ORAL_TABLET | ORAL | 0 refills | Status: DC | PRN

## 2019-07-30 NOTE — Progress Notes (Signed)
Subjective:    Patient ID: Emily Owen, female    DOB: [birth year], 85 y.o.   MRN: [id number]  HPI Patient was seen as a telephone visit October 1.  Symptoms were concerning for COPD exacerbation versus community-acquired pneumonia.  Patient was placed on high-dose prednisone for possible COPD exacerbation as well as Levaquin for both COPD exacerbation and community-acquired pneumonia.  While taking high-dose prednisone, the patient did not sleep for more than 4 days.  She became delirious.  Her daughter was attempting to control this with Tylenol PM coupled with her Percocet coupled with gabapentin coupled with NyQuil.  The patient became delirious and was taken to the emergency room.  There chest x-ray confirmed right lower lobe pneumonia/density that is chronic.  We discussed this and discussed performing a CT scan to rule out underlying malignancy as a potential cause of recurrent pneumonia however given her advanced age and frail state I have recommended against this as it would not change management.  Both the patient and the daughter agree.  Prednisone was discontinued in the hospital and the patient's mental status improved after 2 days.  She is now back to her baseline mental status Past Medical History:  Diagnosis Date  . Cancer ([place])    colon  . Cancer of cervix ([place])    cervix  . COPD (chronic obstructive pulmonary disease) ([place])   . Diabetes mellitus without complication ([place])   . Gallstones   . Gout   . Hard of hearing   . On home O2   . Osteoporosis   . Pleural effusion   . Raynaud's disease    Past Surgical History:  Procedure Laterality Date  . ABDOMINAL HYSTERECTOMY     for cervical cancer 1965  . COLON SURGERY     hemicolectomy 1998  . gallstone surgery     ERCP for gallstone pancreatitis  . ORTHOPEDIC SURGERY     Current Outpatient Medications on File Prior to Visit  Medication Sig Dispense Refill  . albuterol (PROVENTIL) (2.5 MG/3ML) 0.083% nebulizer  solution Take 3 mLs (2.5 mg total) by nebulization every 6 (six) hours as needed for wheezing or shortness of breath. 75 mL 12  . ANORO ELLIPTA 62.5-25 MCG/INH AEPB TAKE 1 PUFF BY MOUTH EVERY DAY 60 each 5  . Balsam [place]-Castor Oil (VENELEX) OINT Apply to bilateral buttocks and sacrum qshift and prn for shear to buttocks and prevention. Every shift    . furosemide (LASIX) 40 MG tablet TAKE 1 TABLET (40 MG TOTAL) BY MOUTH DAILY AS NEEDED FOR FLUID. 30 tablet 3  . gabapentin (NEURONTIN) 100 MG capsule Take 200 mg by mouth at bedtime as needed.     . ondansetron (ZOFRAN ODT) 4 MG disintegrating tablet Take 1 tablet (4 mg total) by mouth every 8 (eight) hours as needed for nausea or vomiting. 20 tablet 0  . oxyCODONE-acetaminophen (PERCOCET) 5-325 MG tablet Take 1 tablet by mouth every 4 (four) hours as needed for severe pain. 30 tablet 0  . OXYGEN Inhale 3 L into the lungs continuous. [employer]    . pantoprazole (PROTONIX) 40 MG tablet Take 1 tablet (40 mg total) by mouth daily. 30 tablet 3  . Respiratory Therapy Supplies (FLUTTER) DEVI Use as directed. 1 each 0  . traMADol (ULTRAM) 50 MG tablet TAKE 1 TABLET (50 MG TOTAL) BY MOUTH EVERY 8 (EIGHT) HOURS AS NEEDED FOR UP TO 5 DAYS.     No current facility-administered medications on file prior to visit.  No Known Allergies Social History   Socioeconomic History  . Marital status: Widowed    Spouse name: Not on file  . Number of children: Not on file  . Years of education: Not on file  . Highest education level: Not on file  Occupational History  . Not on file  Social Needs  . Financial resource strain: Not on file  . Food insecurity    Worry: Not on file    Inability: Not on file  . Transportation needs    Medical: Not on file    Non-medical: Not on file  Tobacco Use  . Smoking status: Former Smoker    Packs/day: 2.00    Years: 35.00    Pack years: 70.00    Types: Cigarettes    Quit date: 08/11/1994    Years since quitting: 24.9   . Smokeless tobacco: Never Used  Substance and Sexual Activity  . Alcohol use: No    Alcohol/week: 0.0 standard drinks  . Drug use: No  . Sexual activity: Not on file  Lifestyle  . Physical activity    Days per week: Not on file    Minutes per session: Not on file  . Stress: Not on file  Relationships  . Social Herbalist on phone: Not on file    Gets together: Not on file    Attends religious service: Not on file    Active member of club or organization: Not on file    Attends meetings of clubs or organizations: Not on file    Relationship status: Not on file  . Intimate partner violence    Fear of current or ex partner: Not on file    Emotionally abused: Not on file    Physically abused: Not on file    Forced sexual activity: Not on file  Other Topics Concern  . Not on file  Social History Narrative  . Not on file      Review of Systems  All other systems reviewed and are negative.      Objective:   Physical Exam Vitals signs reviewed.  Constitutional:      Appearance: Normal appearance. She is not ill-appearing or toxic-appearing.  Cardiovascular:     Rate and Rhythm: Normal rate and regular rhythm.  Pulmonary:     Effort: Pulmonary effort is normal. No respiratory distress.     Breath sounds: Decreased air movement present. Decreased breath sounds present. No wheezing, rhonchi or rales.  Abdominal:     General: Abdomen is flat. Bowel sounds are normal. There is no distension.     Palpations: Abdomen is soft. There is no mass.     Tenderness: There is no abdominal tenderness. There is no rebound.     Hernia: No hernia is present.  Musculoskeletal:     Right lower leg: No edema.     Left lower leg: No edema.  Neurological:     Mental Status: She is alert.          Assessment & Plan:  COPD exacerbation [facility])  Hospital discharge follow-up  I believe the patient had delirium secondary primarily to the prednisone high-dose.  She is now back  to her baseline.  Therefore in the future I would recommend using the quick prednisone taper versus a prolonged high-dose prednisone dosage for COPD exacerbations.  I do not believe that further antibiotics are necessary at this time she is back to her baseline and afebrile.  I believe that polypharmacy  also played a role.  Therefore I recommended that the patient take Tylenol twice a day for her chronic pain.  She can use up to 1 Percocet a day as needed for breakthrough severe pain.  I recommended avoiding any medication that has Benadryl in it and have also avoided using NyQuil.  Patient has neuropathy and therefore can use gabapentin however I recommended against polypharmacy and combining Percocet and gabapentin with other medications simply to achieve sedation as this would likely cause delirium.  disontinue meloxicam.  Patient also appears to be dealing with depression and therefore we will start Lexapro 10 mg a day and reassess in 4 weeks

## 2019-07-31 ENCOUNTER — Other Ambulatory Visit: Payer: Self-pay | Admitting: Family Medicine

## 2019-07-31 LAB — CULTURE, BLOOD (ROUTINE X 2)
Culture: NO GROWTH
Culture: NO GROWTH
Special Requests: ADEQUATE
Special Requests: ADEQUATE

## 2019-08-13 DIAGNOSIS — J449 Chronic obstructive pulmonary disease, unspecified: Secondary | ICD-10-CM | POA: Diagnosis not present

## 2019-08-24 ENCOUNTER — Encounter: Payer: Self-pay | Admitting: Family Medicine

## 2019-08-24 DIAGNOSIS — J449 Chronic obstructive pulmonary disease, unspecified: Secondary | ICD-10-CM | POA: Diagnosis not present

## 2019-08-24 NOTE — Telephone Encounter (Signed)
Patient requesting a refill on Oxycodone     LOV: 07/30/19  LRF:   07/30/19

## 2019-08-25 MED ORDER — OXYCODONE-ACETAMINOPHEN 5-325 MG PO TABS: 1.0000 | ORAL_TABLET | ORAL | 0 refills | Status: DC | PRN

## 2019-08-26 DIAGNOSIS — J449 Chronic obstructive pulmonary disease, unspecified: Secondary | ICD-10-CM | POA: Diagnosis not present

## 2019-09-02 ENCOUNTER — Encounter (HOSPITAL_COMMUNITY): Payer: Self-pay | Admitting: Emergency Medicine

## 2019-09-02 ENCOUNTER — Emergency Department (HOSPITAL_COMMUNITY): Payer: Medicare HMO

## 2019-09-02 ENCOUNTER — Other Ambulatory Visit: Payer: Self-pay

## 2019-09-02 ENCOUNTER — Emergency Department (HOSPITAL_COMMUNITY)
Admission: EM | Admit: 2019-09-02 | Discharge: 2019-09-02 | Disposition: A | Discharge status: Home / Self Care | Payer: Medicare HMO | Attending: Emergency Medicine | Admitting: Emergency Medicine

## 2019-09-02 DIAGNOSIS — R0902 Hypoxemia: Secondary | ICD-10-CM | POA: Diagnosis not present

## 2019-09-02 DIAGNOSIS — Z87891 Personal history of nicotine dependence: Secondary | ICD-10-CM | POA: Insufficient documentation

## 2019-09-02 DIAGNOSIS — R0602 Shortness of breath: Secondary | ICD-10-CM | POA: Diagnosis not present

## 2019-09-02 DIAGNOSIS — R778 Other specified abnormalities of plasma proteins: Secondary | ICD-10-CM | POA: Diagnosis not present

## 2019-09-02 DIAGNOSIS — R05 Cough: Secondary | ICD-10-CM | POA: Diagnosis not present

## 2019-09-02 DIAGNOSIS — E119 Type 2 diabetes mellitus without complications: Secondary | ICD-10-CM | POA: Insufficient documentation

## 2019-09-02 DIAGNOSIS — R069 Unspecified abnormalities of breathing: Secondary | ICD-10-CM | POA: Diagnosis not present

## 2019-09-02 DIAGNOSIS — J441 Chronic obstructive pulmonary disease with (acute) exacerbation: Secondary | ICD-10-CM | POA: Diagnosis not present

## 2019-09-02 DIAGNOSIS — Z791 Long term (current) use of non-steroidal anti-inflammatories (NSAID): Secondary | ICD-10-CM | POA: Diagnosis not present

## 2019-09-02 DIAGNOSIS — J9611 Chronic respiratory failure with hypoxia: Secondary | ICD-10-CM | POA: Diagnosis not present

## 2019-09-02 DIAGNOSIS — Z79899 Other long term (current) drug therapy: Secondary | ICD-10-CM | POA: Diagnosis not present

## 2019-09-02 DIAGNOSIS — Z20828 Contact with and (suspected) exposure to other viral communicable diseases: Secondary | ICD-10-CM | POA: Insufficient documentation

## 2019-09-02 DIAGNOSIS — R0689 Other abnormalities of breathing: Secondary | ICD-10-CM | POA: Diagnosis not present

## 2019-09-02 DIAGNOSIS — R404 Transient alteration of awareness: Secondary | ICD-10-CM | POA: Diagnosis not present

## 2019-09-02 LAB — COMPREHENSIVE METABOLIC PANEL
ALT: 7 U/L (ref 0–44)
AST: 14 U/L — ABNORMAL LOW (ref 15–41)
Albumin: 3.1 g/dL — ABNORMAL LOW (ref 3.5–5.0)
Alkaline Phosphatase: 54 U/L (ref 38–126)
Anion gap: 12 (ref 5–15)
BUN: 19 mg/dL (ref 8–23)
CO2: 38 mmol/L — ABNORMAL HIGH (ref 22–32)
Calcium: 8.2 mg/dL — ABNORMAL LOW (ref 8.9–10.3)
Chloride: 92 mmol/L — ABNORMAL LOW (ref 98–111)
Creatinine, Ser: 1 mg/dL (ref 0.44–1.00)
GFR calc Af Amer: 59 mL/min — ABNORMAL LOW (ref >60)
GFR calc non Af Amer: 51 mL/min — ABNORMAL LOW (ref >60)
Glucose, Bld: 112 mg/dL — ABNORMAL HIGH (ref 70–99)
Potassium: 4.1 mmol/L (ref 3.5–5.1)
Sodium: 142 mmol/L (ref 135–145)
Total Bilirubin: 0.9 mg/dL (ref 0.3–1.2)
Total Protein: 5.5 g/dL — ABNORMAL LOW (ref 6.5–8.1)

## 2019-09-02 LAB — CBC WITH DIFFERENTIAL/PLATELET
Abs Immature Granulocytes: 0.03 10*3/uL (ref 0.00–0.07)
Basophils Absolute: 0 10*3/uL (ref 0.0–0.1)
Basophils Relative: 1 %
Eosinophils Absolute: 0.1 10*3/uL (ref 0.0–0.5)
Eosinophils Relative: 1 %
HCT: 33.9 % — ABNORMAL LOW (ref 36.0–46.0)
Hemoglobin: 9.6 g/dL — ABNORMAL LOW (ref 12.0–15.0)
Immature Granulocytes: 1 %
Lymphocytes Relative: 17 %
Lymphs Abs: 1 10*3/uL (ref 0.7–4.0)
MCH: 27 pg (ref 26.0–34.0)
MCHC: 28.3 g/dL — ABNORMAL LOW (ref 30.0–36.0)
MCV: 95.2 fL (ref 80.0–100.0)
Monocytes Absolute: 0.4 10*3/uL (ref 0.1–1.0)
Monocytes Relative: 8 %
Neutro Abs: 4.1 10*3/uL (ref 1.7–7.7)
Neutrophils Relative %: 72 %
Platelets: 164 10*3/uL (ref 150–400)
RBC: 3.56 MIL/uL — ABNORMAL LOW (ref 3.87–5.11)
RDW: 15.3 % (ref 11.5–15.5)
WBC: 5.6 10*3/uL (ref 4.0–10.5)
nRBC: 0 % (ref 0.0–0.2)

## 2019-09-02 LAB — LACTIC ACID, PLASMA
Lactic Acid, Venous: 1.1 mmol/L (ref 0.5–1.9)
Lactic Acid, Venous: 1.1 mmol/L (ref 0.5–1.9)

## 2019-09-02 LAB — TROPONIN I (HIGH SENSITIVITY)
Troponin I (High Sensitivity): 27 ng/L — ABNORMAL HIGH (ref <18)
Troponin I (High Sensitivity): 42 ng/L — ABNORMAL HIGH (ref <18)

## 2019-09-02 LAB — BRAIN NATRIURETIC PEPTIDE: B Natriuretic Peptide: 110 pg/mL — ABNORMAL HIGH (ref 0.0–100.0)

## 2019-09-02 MED ORDER — METHYLPREDNISOLONE SODIUM SUCC 125 MG IJ SOLR
80.0000 mg | Freq: Once | INTRAMUSCULAR | Status: AC
  Administered 2019-09-02: 15:00:00 80 mg via INTRAVENOUS

## 2019-09-02 MED ORDER — METHYLPREDNISOLONE SODIUM SUCC 125 MG IJ SOLR
125.0000 mg | Freq: Once | INTRAMUSCULAR | Status: DC
  Filled 2019-09-02: qty 2

## 2019-09-02 MED ORDER — LEVOFLOXACIN 500 MG PO TABS: 500.0000 mg | ORAL_TABLET | Freq: Every day | ORAL | 0 refills | Status: DC

## 2019-09-02 MED ORDER — SODIUM CHLORIDE 0.9 % IV BOLUS
500.0000 mL | Freq: Once | INTRAVENOUS | Status: AC
  Administered 2019-09-02: 15:00:00 500 mL via INTRAVENOUS

## 2019-09-02 MED ORDER — PREDNISONE 10 MG PO TABS: 20.0000 mg | ORAL_TABLET | Freq: Every day | ORAL | 0 refills | Status: DC

## 2019-09-02 MED ORDER — ALBUTEROL SULFATE HFA 108 (90 BASE) MCG/ACT IN AERS
6.0000 | INHALATION_SPRAY | Freq: Once | RESPIRATORY_TRACT | Status: AC
  Administered 2019-09-02: 15:00:00 6 via RESPIRATORY_TRACT
  Filled 2019-09-02: qty 6.7

## 2019-09-02 MED ORDER — SODIUM CHLORIDE 0.9 % IV BOLUS: 500.0000 mL | Freq: Once | INTRAVENOUS | Status: DC

## 2019-09-02 MED ORDER — SODIUM CHLORIDE 0.9 % IV BOLUS
500.0000 mL | Freq: Once | INTRAVENOUS | Status: AC
  Administered 2019-09-02: 500 mL via INTRAVENOUS

## 2019-09-02 MED ORDER — LEVOFLOXACIN 500 MG PO TABS: 500.0000 mg | ORAL_TABLET | Freq: Every day | ORAL | 0 refills | Status: AC

## 2019-09-02 NOTE — Discharge Instructions (Addendum)
Begin taking prednisone and Levaquin as prescribed.  Return to the emergency department for worsening breathing, severe chest pain, or other new and concerning symptoms.

## 2019-09-02 NOTE — ED Triage Notes (Signed)
Pt from home with family.  EMS called for low o2.  Pt checked pt's oxygen, 42% on 3L. Pt Altered, confused, foul odor noted at home.    Pt a/o x 4 in room.

## 2019-09-02 NOTE — ED Provider Notes (Signed)
[facility] Provider Note   CSN: [id number] Arrival date & time: 09/02/19  1229     History   Chief Complaint Chief Complaint  Patient presents with  . Shortness of Breath    HPI Emily Owen is a 85 y.o. female.     Patient is an 85 year old female with history of COPD on home oxygen, diabetes,, colon cancer.  She presents today for evaluation of shortness of breath.  From what EMS reports, family reported patient appeared "blue" at home.  EMS was called and the patient was transported here.  Initial oxygen saturation when EMS arrived was 42%.  Saturations improved to the 90s with 3 L nasal cannula that was applied by EMS.  Patient denies chest pain.  She does report some shortness of breath.  She has somewhat a poor historian.  The history is provided by the patient.    Past Medical History:  Diagnosis Date  . Cancer ([place])    colon  . Cancer of cervix ([place])    cervix  . COPD (chronic obstructive pulmonary disease) ([place])   . Diabetes mellitus without complication ([place])   . Gallstones   . Gout   . Hard of hearing   . On home O2   . Osteoporosis   . Pleural effusion   . Raynaud's disease     Patient Active Problem List   Diagnosis Date Noted  . Altered mental status 07/26/2019  . Aspiration into airway 02/09/2019  . Chronic obstructive pulmonary disease with acute exacerbation ([place])   . Acute on chronic respiratory failure with hypoxia and hypercapnia (HCC)   . Palliative care by specialist   . Advanced care planning/counseling discussion   . Goals of care, counseling/discussion   . AMS (altered mental status) 02/07/2019  . DNR (do not resuscitate) 01/27/2019  . Pressure injury of skin 01/15/2019  . Severe sepsis ([place]) 01/15/2019  . RLL pneumonia 01/15/2019  . Generalized weakness 01/15/2019  . Left upper lobe pulmonary nodule 01/15/2019  . Raynaud's disease   . Pancreatitis 02/13/2018  . Routine general medical examination at a  health care facility 07/19/2017  . Gout 03/14/2016  . Atherosclerosis of aorta ([place]) 06/11/2015  . Diabetes mellitus with peripheral vascular disease ([place])   . Chronic respiratory failure with hypercapnia ([place]) 06/02/2015  . COPD, severe ([place]) 03/25/2015  . Pleural effusion on right 03/25/2015  . COPD with acute exacerbation ([place]) 08/11/2014  . HTN (hypertension) 08/11/2014  . Acute on chronic respiratory failure with hypoxia ([place]) 08/22/2012  . Hyperlipidemia 08/20/2012    Past Surgical History:  Procedure Laterality Date  . ABDOMINAL HYSTERECTOMY     for cervical cancer 1965  . COLON SURGERY     hemicolectomy 1998  . gallstone surgery     ERCP for gallstone pancreatitis  . ORTHOPEDIC SURGERY       OB History    Gravida  6   Para  6   Term  6   Preterm      AB      Living  5     SAB      TAB      Ectopic      Multiple      Live Births               Home Medications    Prior to Admission medications   Medication Sig Start Date End Date Taking? Authorizing Provider  albuterol (PROVENTIL) (2.5 MG/3ML) 0.083% nebulizer solution Take  3 mLs (2.5 mg total) by nebulization every 6 (six) hours as needed for wheezing or shortness of breath. 07/28/19   Roxan Hockey, MD  ANORO ELLIPTA 62.5-25 MCG/INH AEPB TAKE 1 PUFF BY MOUTH EVERY DAY 12/09/18   Juanito Doom, MD  Balsam [place]-Castor Oil [facility]) OINT Apply to bilateral buttocks and sacrum qshift and prn for shear to buttocks and prevention. Every shift    [provider]  escitalopram (LEXAPRO) 10 MG tablet Take 1 tablet (10 mg total) by mouth daily. 07/30/19   [name], MD  furosemide (LASIX) 40 MG tablet TAKE 1 TABLET (40 MG TOTAL) BY MOUTH DAILY AS NEEDED FOR FLUID. 07/27/19   [name], MD  gabapentin (NEURONTIN) 100 MG capsule Take 2 capsules (200 mg total) by mouth at bedtime as needed (for nerve pain). 07/30/19   [name], MD  meloxicam (MOBIC) 7.5 MG tablet TAKE 1  TABLET BY MOUTH EVERY DAY 07/31/19   [place], Modena Nunnery, MD  ondansetron (ZOFRAN ODT) 4 MG disintegrating tablet Take 1 tablet (4 mg total) by mouth every 8 (eight) hours as needed for nausea or vomiting. 06/03/19   Robinson, [place] N, PA-C  oxyCODONE-acetaminophen (PERCOCET) 5-325 MG tablet Take 1 tablet by mouth every 4 (four) hours as needed for severe pain. 08/25/19   [name], MD  OXYGEN Inhale 3 L into the lungs continuous. [facility]    [provider]  pantoprazole (PROTONIX) 40 MG tablet Take 1 tablet (40 mg total) by mouth daily. 06/09/19   [name], MD  Respiratory Therapy Supplies (FLUTTER) DEVI Use as directed. 10/01/18   Parrett, Fonnie Mu, NP    Family History Family History  Problem Relation Age of Onset  . Diabetes Sister   . Diabetes Sister   . Diabetes Sister   . Hypertension Mother   . Heart disease Mother   . Diabetes Sister     Social History Social History   Tobacco Use  . Smoking status: Former Smoker    Packs/day: 2.00    Years: 35.00    Pack years: 70.00    Types: Cigarettes    Quit date: 08/11/1994    Years since quitting: 25.0  . Smokeless tobacco: Never Used  Substance Use Topics  . Alcohol use: No    Alcohol/week: 0.0 standard drinks  . Drug use: No     Allergies   Patient has no known allergies.   Review of Systems Review of Systems  All other systems reviewed and are negative.    Physical Exam Updated Vital Signs BP (!) 84/52   Pulse 84   Temp 97.9 F (36.6 C)   Resp 16   Wt 75.8 kg   SpO2 97%   BMI 28.67 kg/m   Physical Exam Vitals signs and nursing note reviewed.  Constitutional:      General: She is not in acute distress.    Appearance: She is well-developed. She is not diaphoretic.  HENT:     Head: Normocephalic and atraumatic.  Neck:     Musculoskeletal: Normal range of motion and neck supple.  Cardiovascular:     Rate and Rhythm: Normal rate and regular rhythm.     Heart sounds: No murmur. No  friction rub. No gallop.   Pulmonary:     Effort: Pulmonary effort is normal. No respiratory distress.     Breath sounds: Examination of the right-lower field reveals rales. Examination of the left-lower field reveals rales. Rales present. No  wheezing.  Abdominal:     General: Bowel sounds are normal. There is no distension.     Palpations: Abdomen is soft.     Tenderness: There is no abdominal tenderness.  Musculoskeletal: Normal range of motion.  Skin:    General: Skin is warm and dry.  Neurological:     Mental Status: She is alert and oriented to person, place, and time.      ED Treatments / Results  Labs (all labs ordered are listed, but only abnormal results are displayed) Labs Reviewed  COMPREHENSIVE METABOLIC PANEL  CBC WITH DIFFERENTIAL/PLATELET  LACTIC ACID, PLASMA  LACTIC ACID, PLASMA  BRAIN NATRIURETIC PEPTIDE  TROPONIN I (HIGH SENSITIVITY)    EKG EKG Interpretation  Date/Time:  Wednesday September 02 2019 12:34:21 EST Ventricular Rate:  83 PR Interval:    QRS Duration: 89 QT Interval:  397 QTC Calculation: 467 R Axis:   53 Text Interpretation: Sinus rhythm with first degree av block Abnormal R-wave progression, early transition Nonspecific repol abnormality, diffuse leads Confirmed by Veryl Speak [phone]) on 09/02/2019 3:06:26 PM   Radiology No results found.  Procedures Procedures (including critical care time)  Medications Ordered in ED Medications - No data to display   Initial Impression / Assessment and Plan / ED Course  I have reviewed the triage vital signs and the nursing notes.  Pertinent labs & imaging results that were available during my care of the patient were reviewed by me and considered in my medical decision making (see chart for details).  Patient presenting here with shortness of breath and low oxygen saturations at home.  I am told that her pulse ox read 42%.  This seemed to improve after being placed on oxygen by EMS.  Patient  arrived here initially hypotensive with blood pressure 82/40.  IV fluids were initiated and laboratory studies were obtained.  Laboratory studies reveal results basically consistent with her baseline.  Chest x-ray with a persistent right basilar opacity consistent with atelectasis and/or pneumonia.  EKG basically unchanged.  When speaking with the patient's daughter, she informed me that her mother has been congested and coughing for the past several days.  Due to the patient's initial reported hypoxia and hypotension upon presentation, I felt as though admission was reasonable.  I called the hospitalist service and spoke with Dr. Dyann Kief.  He did not feel as though the patient requires admission despite the initial abnormal oxygen levels and hypotension.    Her blood pressure is now 99991111 systolic after receiving iv fluids.  She does feel better and the daughter also feels as though she is back to her baseline and is comfortable with her coming home.  Patient will be discharged with prednisone and antibiotics.  UA pending at this time.    Final Clinical Impressions(s) / ED Diagnoses   Final diagnoses:  None    ED Discharge Orders    None       Veryl Speak, MD 09/02/19 1516

## 2019-09-02 NOTE — Consult Note (Signed)
Medical Consultation   Emily Owen  [id number]  DOB: Mar 03, 1930  DOA: 09/02/2019  PCP: [name], MD   Requesting physician: Dr. Venita Sheffield  Reason for consultation: Consultation for admission   History of Present Illness: Emily Owen is an 85 y.o. female with past medical history significant for oxygen dependent COPD with chronic hypoxic respiratory failure, DM 2, colon cancer who was noted to be blue at home by her daughter.  EMS was called and initial O2 saturation was 42%.  Patient was placed back on her home oxygen and O2 saturations came up to 92% on her usual 3 L nasal cannula.  Patient was brought into the emergency room where work-up including a chest x-ray was negative.  Patient stated that she felt at baseline and wanted to go home.  Tried hospitalist were consulted for admission because routine troponins were noted to be 27 and then 46 on repeat after 2 hours.  To my history patient states that she feels fine and is back to baseline which is confirmed by her daughter who is at bedside.  Patient specifically denies no chest pain back pain shoulder pain jaw pain or neck pain.  She does admit that she is got pain all over her body but that is no different from usual.  Patient denies any change in baseline shortness of breath.  Patient does admit that oxygen not infrequently falls off her face when she goes to sleep.  Daughter is aware of the issue and they are working towards improving that by going to try a face mask which is been ordered.  Patient denies fevers or chills.  No new cough.  No new sputum production.  No wheezes.  No new orthopnea or PND.  As noted above no chest pain or palpitations.  Patient essentially at baseline.  Review of Systems:  ROS As per HPI otherwise 10 point review of systems negative.    Past Medical History: Past Medical History:  Diagnosis Date   Cancer ([place])    colon   Cancer of cervix ([place])    cervix    COPD (chronic obstructive pulmonary disease) ([place])    Diabetes mellitus without complication ([place])    Gallstones    Gout    Hard of hearing    On home O2    Osteoporosis    Pleural effusion    Raynaud's disease     Past Surgical History: Past Surgical History:  Procedure Laterality Date   ABDOMINAL HYSTERECTOMY     for cervical cancer 1965   COLON SURGERY     hemicolectomy 1998   gallstone surgery     ERCP for gallstone pancreatitis   ORTHOPEDIC SURGERY       Allergies:  No Known Allergies   Social History:  reports that she quit smoking about 25 years ago. Her smoking use included cigarettes. She has a 70.00 pack-year smoking history. She has never used smokeless tobacco. She reports that she does not drink alcohol or use drugs.   Family History: Family History  Problem Relation Age of Onset   Diabetes Sister    Diabetes Sister    Diabetes Sister    Hypertension Mother    Heart disease Mother    Diabetes Sister     Unacceptable: Noncontributory, unremarkable, or negative. Acceptable: Family history reviewed and not pertinent (If you reviewed it)   Physical Exam: Vitals:   09/02/19  1500 09/02/19 1530 09/02/19 1545 09/02/19 1600  BP: (!) 95/57 (!) 109/56  (!) 124/59  Pulse: 74 85 86 88  Resp: 18 19 17 20   Temp:      SpO2: 93% 96% 96% 96%  Weight:        Constitutional obese female lying at 20 degrees with oxygen via nasal cannula on her face is sleeping.  She is easily arousable by voice alone and is cooperative and pleasant.  She is able to speak in full sentences without difficulty.  She has no tachypnea and no increased work of breathing. Eyes:  anicteric sclera, conjunctiva mildly injected bilaterally CVS: S1-S2 clear, no murmur rubs or gallops, no LE edema, normal pedal pulses  Respiratory: Decreased air entry bilaterally.  No adventitious sounds.  No wheezes or rales noted. Abdomen: Obese, soft nontender, nondistended, normal  bowel sounds Musculoskeletal: Trace edema bilaterally and dependent areas.   Neuro: Alert and oriented x3, grossly nonfocal Psych: judgement and insight appear normal, stable mood and affect, mental status  Data reviewed:  I have personally reviewed following labs and imaging studies Labs:  CBC: Recent Labs  Lab 09/02/19 1250  WBC 5.6  NEUTROABS 4.1  HGB 9.6*  HCT 33.9*  MCV 95.2  PLT 123456    Basic Metabolic Panel: Recent Labs  Lab 09/02/19 1250  NA 142  K 4.1  CL 92*  CO2 38*  GLUCOSE 112*  BUN 19  CREATININE 1.00  CALCIUM 8.2*   GFR Estimated Creatinine Clearance: 39.5 mL/min (by C-G formula based on SCr of 1 mg/dL). Liver Function Tests: Recent Labs  Lab 09/02/19 1250  AST 14*  ALT 7  ALKPHOS 54  BILITOT 0.9  PROT 5.5*  ALBUMIN 3.1*   Initial high-sensitivity troponin is 27, it is 42 two hours later.  EKG sinus rhythm with long first-degree heart block at 80.  Normal axis.  Diffusely flattened T waves.  No acute ST-T wave changes noted.  Urinalysis    Component Value Date/Time   COLORURINE STRAW (A) 07/26/2019 2300   APPEARANCEUR CLEAR 07/26/2019 2300   LABSPEC 1.005 07/26/2019 2300   PHURINE 7.0 07/26/2019 2300   GLUCOSEU NEGATIVE 07/26/2019 2300   HGBUR NEGATIVE 07/26/2019 2300   BILIRUBINUR NEGATIVE 07/26/2019 2300   KETONESUR NEGATIVE 07/26/2019 2300   PROTEINUR NEGATIVE 07/26/2019 2300   UROBILINOGEN 0.2 07/11/2015 1149   NITRITE NEGATIVE 07/26/2019 2300   LEUKOCYTESUR NEGATIVE 07/26/2019 2300     Radiological Exams on Admission: Dg Chest Port 1 View  Result Date: 09/02/2019 CLINICAL DATA:  Shortness of breath. EXAM: PORTABLE CHEST 1 VIEW COMPARISON:  Chest radiograph 07/26/2019 FINDINGS: Evaluation significantly limited due to patient rotation. Cardiomediastinal silhouette poorly evaluated due to patient rotation. Persistent right basilar opacity consistent with atelectasis and/or pneumonia as well as pleural effusion. The left lung is  well aerated. No evidence of pneumothorax, although please note that the patient's chin obscures the right lung apex. No acute bony abnormality. IMPRESSION: Evaluation significant limited by patient rotation. Additionally, the patient's chin obscures the right lung apex. Persistent right basilar opacity consistent with atelectasis and/or pneumonia as well as pleural effusion. Electronically Signed   By: Kellie Simmering DO   On: 09/02/2019 13:16    Impression/Recommendations Active Problems:   Elevated troponin  ELEVATED TROPONIN Patient with chronic hypoxic respiratory failure was noted to be hypoxic when her oxygen inadvertently fell off her face.  It is unclear how long she was hypoxic for.  However her O2 saturation returned to normal  as soon as her oxygen was placed back on her.  Work-up in the ED does not show any acute respiratory or cardiac event.  Patient herself specifically denies any chest back or shoulder pain or anything else that sounds like angina.  She states she feels back to baseline.  EKG is normal.  Troponins are minimally elevated and on repeat are essentially the same.  This was discussed with Dr. Burt Knack over the phone who agrees that troponin of 27 and 42 are essentially flat without any evidence of increase.  Given lack of symptoms, normal EKG and essentially flat gray zone troponins would recommend discharge home on patient's oxygen.  Discussed importance of keeping oxygen on patient's face with patient and daughter both of whom understand the situation and are working towards that.   Thank you for this consultation.  Our [facility] hospitalist team will follow the patient with you.   Time Spent: 50 MINUTES  Vashti Hey M.D. [facility] 09/02/2019, 6:19 PM

## 2019-09-02 NOTE — ED Notes (Signed)
RT notified for inhaler

## 2019-09-02 NOTE — ED Provider Notes (Addendum)
Patient was significant change in her troponin.  Initial 1 was 27.  With a repeat 2-hour troponin was elevated to 42.  Representing a change of 15.  Warranting observation admission and serial troponins.  Patient did apparently have a hypoxic episode these could be secondary to that.  Will rediscuss with hospitalist for admission.  Patient currently without any complaint of chest pain.   Fredia Sorrow, MD 09/02/19 [phone]   Hospitalist spoke with Dr. Burt Knack from cardiology.  He considers the change in troponin flat and that patient can be discharged home probably secondary to the hypoxia.  Patient is yet to provide urinalysis yet.    Fredia Sorrow, MD 09/02/19 [phone]

## 2019-09-03 LAB — SARS CORONAVIRUS 2 (TAT 6-24 HRS): SARS Coronavirus 2: NEGATIVE

## 2019-09-09 ENCOUNTER — Telehealth: Payer: Self-pay | Admitting: Family Medicine

## 2019-09-09 NOTE — Telephone Encounter (Signed)
Emily,patients daughter calling to say she has a pressure ulcer on her bottom, dr pickard did not have any available appts or dr Buelah Manis I told her I would send you an urgent message regarding this so we could make a spot for her or see what dr pickard wanted to do  [phone] [name])

## 2019-09-10 NOTE — Telephone Encounter (Signed)
Apt made and [name] aware

## 2019-09-10 NOTE — Telephone Encounter (Signed)
Please schedule her to come in Friday at 1230

## 2019-09-11 ENCOUNTER — Other Ambulatory Visit: Payer: Self-pay

## 2019-09-11 ENCOUNTER — Ambulatory Visit (INDEPENDENT_AMBULATORY_CARE_PROVIDER_SITE_OTHER): Payer: Medicare HMO | Admitting: Family Medicine

## 2019-09-11 ENCOUNTER — Encounter: Payer: Self-pay | Admitting: Family Medicine

## 2019-09-11 VITALS — BP 100/58 | HR 58 | Temp 96.3°F | Resp 16 | Ht 64.0 in

## 2019-09-11 DIAGNOSIS — L89322 Pressure ulcer of left buttock, stage 2: Secondary | ICD-10-CM | POA: Diagnosis not present

## 2019-09-11 NOTE — Progress Notes (Signed)
Subjective:    Patient ID: Emily Owen, female    DOB: Mar 07, 1930, 85 y.o.   MRN: [id number]  HPI  Patient is here today with her family to discuss pressure sores on her sacral area.  She spends the majority of the day sitting in a chair.  She does not like to sit on a pad because she finds that uncomfortable.  Over the sacrum, the skin there shows nonblanchable erythema.  This extends onto the right gluteal cleft.  On the left gluteal cleft there is skin breakdown that does not extend through the dermis.  It is very superficial and shallow.  It is roughly the diameter of a quarter.  This is a stage II pressure sore.  There is no evidence of cellulitis.  There is no foul smelling drainage Past Medical History:  Diagnosis Date  . Cancer ([place])    colon  . Cancer of cervix ([place])    cervix  . COPD (chronic obstructive pulmonary disease) ([place])   . Diabetes mellitus without complication ([place])   . Gallstones   . Gout   . Hard of hearing   . On home O2   . Osteoporosis   . Pleural effusion   . Raynaud's disease    Past Surgical History:  Procedure Laterality Date  . ABDOMINAL HYSTERECTOMY     for cervical cancer 1965  . COLON SURGERY     hemicolectomy 1998  . gallstone surgery     ERCP for gallstone pancreatitis  . ORTHOPEDIC SURGERY     Current Outpatient Medications on File Prior to Visit  Medication Sig Dispense Refill  . albuterol (PROVENTIL) (2.5 MG/3ML) 0.083% nebulizer solution Take 3 mLs (2.5 mg total) by nebulization every 6 (six) hours as needed for wheezing or shortness of breath. 75 mL 12  . alendronate (FOSAMAX) 70 MG tablet Take 70 mg by mouth once a week.    Jearl Klinefelter ELLIPTA 62.5-25 MCG/INH AEPB TAKE 1 PUFF BY MOUTH EVERY DAY 60 each 5  . escitalopram (LEXAPRO) 10 MG tablet Take 1 tablet (10 mg total) by mouth daily. 30 tablet 5  . gabapentin (NEURONTIN) 100 MG capsule Take 2 capsules (200 mg total) by mouth at bedtime as needed (for nerve pain). 30 capsule 1   . levofloxacin (LEVAQUIN) 500 MG tablet Take 1 tablet (500 mg total) by mouth daily. 7 tablet 0  . oxyCODONE-acetaminophen (PERCOCET) 5-325 MG tablet Take 1 tablet by mouth every 4 (four) hours as needed for severe pain. 30 tablet 0  . OXYGEN Inhale 3 L into the lungs continuous. [facility]    . predniSONE (DELTASONE) 10 MG tablet Take 2 tablets (20 mg total) by mouth daily. 10 tablet 0  . Respiratory Therapy Supplies (FLUTTER) DEVI Use as directed. 1 each 0  . furosemide (LASIX) 40 MG tablet TAKE 1 TABLET (40 MG TOTAL) BY MOUTH DAILY AS NEEDED FOR FLUID. (Patient not taking: Reported on 09/02/2019) 30 tablet 3  . meloxicam (MOBIC) 7.5 MG tablet TAKE 1 TABLET BY MOUTH EVERY DAY (Patient not taking: Reported on 09/02/2019) 30 tablet 0  . ondansetron (ZOFRAN ODT) 4 MG disintegrating tablet Take 1 tablet (4 mg total) by mouth every 8 (eight) hours as needed for nausea or vomiting. (Patient not taking: Reported on 09/02/2019) 20 tablet 0  . pantoprazole (PROTONIX) 40 MG tablet Take 1 tablet (40 mg total) by mouth daily. (Patient not taking: Reported on 09/02/2019) 30 tablet 3   No current facility-administered medications on file prior  to visit.    No Known Allergies Social History   Socioeconomic History  . Marital status: Widowed    Spouse name: Not on file  . Number of children: Not on file  . Years of education: Not on file  . Highest education level: Not on file  Occupational History  . Not on file  Social Needs  . Financial resource strain: Not on file  . Food insecurity    Worry: Not on file    Inability: Not on file  . Transportation needs    Medical: Not on file    Non-medical: Not on file  Tobacco Use  . Smoking status: Former Smoker    Packs/day: 2.00    Years: 35.00    Pack years: 70.00    Types: Cigarettes    Quit date: 08/11/1994    Years since quitting: 25.1  . Smokeless tobacco: Never Used  Substance and Sexual Activity  . Alcohol use: No    Alcohol/week: 0.0  standard drinks  . Drug use: No  . Sexual activity: Not on file  Lifestyle  . Physical activity    Days per week: Not on file    Minutes per session: Not on file  . Stress: Not on file  Relationships  . Social Herbalist on phone: Not on file    Gets together: Not on file    Attends religious service: Not on file    Active member of club or organization: Not on file    Attends meetings of clubs or organizations: Not on file    Relationship status: Not on file  . Intimate partner violence    Fear of current or ex partner: Not on file    Emotionally abused: Not on file    Physically abused: Not on file    Forced sexual activity: Not on file  Other Topics Concern  . Not on file  Social History Narrative  . Not on file     Review of Systems     Objective:   Physical Exam Cardiovascular:     Rate and Rhythm: Normal rate and regular rhythm.  Pulmonary:     Effort: Pulmonary effort is normal. No respiratory distress.     Breath sounds: Normal breath sounds.  Musculoskeletal:       Back:           Assessment & Plan:  Pressure sore of left ischial area, stage II ([place])  I applied a DuoDERM to the affected area today.  Recommended DuoDERM dressing changes every 2 to 3 days.  Gave the patient 3 DuoDERM dressings to use at home.  Recommended that the patient change position every 2 hours.  Recommend that they put thick padding such as eggcrate foam and every chair that she sits in.  Also recommended that she move from the chair to the bed every 2 hours to offload pressure from the affected area.  Keep the area clean and dry.  Treat cellulitis if any evidence of cellulitis occurs.  Recommended a protein supplement such as Ensure to counter protein calorie malnutrition.  Monitor daily for worsening.  Family is comfortable with this plan

## 2019-09-13 DIAGNOSIS — J449 Chronic obstructive pulmonary disease, unspecified: Secondary | ICD-10-CM | POA: Diagnosis not present

## 2019-09-19 ENCOUNTER — Other Ambulatory Visit: Payer: Self-pay | Admitting: Family Medicine

## 2019-09-21 ENCOUNTER — Other Ambulatory Visit: Payer: Self-pay | Admitting: Family Medicine

## 2019-09-21 MED ORDER — OXYCODONE-ACETAMINOPHEN 5-325 MG PO TABS: 1.0000 | ORAL_TABLET | ORAL | 0 refills | Status: DC | PRN

## 2019-09-21 NOTE — Telephone Encounter (Signed)
Ok to refill??  Last office visit 09/11/2019.  Last refill 08/25/2019.

## 2019-09-23 DIAGNOSIS — J449 Chronic obstructive pulmonary disease, unspecified: Secondary | ICD-10-CM | POA: Diagnosis not present

## 2019-09-25 DIAGNOSIS — J449 Chronic obstructive pulmonary disease, unspecified: Secondary | ICD-10-CM | POA: Diagnosis not present

## 2019-10-13 ENCOUNTER — Other Ambulatory Visit: Payer: Self-pay | Admitting: Pulmonary Disease

## 2019-10-13 DIAGNOSIS — J449 Chronic obstructive pulmonary disease, unspecified: Secondary | ICD-10-CM | POA: Diagnosis not present

## 2019-10-18 ENCOUNTER — Other Ambulatory Visit: Payer: Self-pay | Admitting: Family Medicine

## 2019-10-19 ENCOUNTER — Encounter: Payer: Self-pay | Admitting: Family Medicine

## 2019-10-19 MED ORDER — OXYCODONE-ACETAMINOPHEN 5-325 MG PO TABS: 1.0000 | ORAL_TABLET | ORAL | 0 refills | Status: DC | PRN

## 2019-10-19 NOTE — Telephone Encounter (Signed)
Last office visit: 09/11/2019 Last refilled: 09/21/2019

## 2019-10-19 NOTE — Telephone Encounter (Signed)
Ok to refill??  Last office visit 09/11/2019.  Last refill 09/21/2019.

## 2019-10-24 DIAGNOSIS — J449 Chronic obstructive pulmonary disease, unspecified: Secondary | ICD-10-CM | POA: Diagnosis not present

## 2019-10-26 DIAGNOSIS — J449 Chronic obstructive pulmonary disease, unspecified: Secondary | ICD-10-CM | POA: Diagnosis not present

## 2019-11-05 ENCOUNTER — Encounter: Payer: Self-pay | Admitting: Family Medicine

## 2019-11-06 ENCOUNTER — Other Ambulatory Visit: Payer: Self-pay | Admitting: Family Medicine

## 2019-11-06 MED ORDER — PREDNISONE 20 MG PO TABS: ORAL_TABLET | ORAL | 0 refills | Status: DC

## 2019-11-06 MED ORDER — DOXYCYCLINE HYCLATE 100 MG PO TABS: 100.0000 mg | ORAL_TABLET | Freq: Two times a day (BID) | ORAL | 0 refills | Status: DC

## 2019-11-13 DIAGNOSIS — J449 Chronic obstructive pulmonary disease, unspecified: Secondary | ICD-10-CM | POA: Diagnosis not present

## 2019-11-16 ENCOUNTER — Other Ambulatory Visit: Payer: Self-pay | Admitting: Family Medicine

## 2019-11-16 NOTE — Telephone Encounter (Signed)
Last refilled: 10/19/2019 Last office visit: 09/11/2019

## 2019-11-18 ENCOUNTER — Other Ambulatory Visit: Payer: Self-pay | Admitting: Family Medicine

## 2019-11-18 ENCOUNTER — Encounter: Payer: Self-pay | Admitting: Family Medicine

## 2019-11-18 ENCOUNTER — Other Ambulatory Visit: Payer: Self-pay | Admitting: Pulmonary Disease

## 2019-11-18 NOTE — Telephone Encounter (Signed)
Ok to refill??  Last office visit 09/11/2019.  Last refill 10/19/2019.

## 2019-11-19 MED ORDER — OXYCODONE-ACETAMINOPHEN 5-325 MG PO TABS: 1.0000 | ORAL_TABLET | ORAL | 0 refills | Status: DC | PRN

## 2019-11-22 DIAGNOSIS — G3184 Mild cognitive impairment, so stated: Secondary | ICD-10-CM | POA: Diagnosis not present

## 2019-11-22 DIAGNOSIS — I499 Cardiac arrhythmia, unspecified: Secondary | ICD-10-CM | POA: Diagnosis not present

## 2019-11-22 DIAGNOSIS — J9610 Chronic respiratory failure, unspecified whether with hypoxia or hypercapnia: Secondary | ICD-10-CM | POA: Diagnosis not present

## 2019-11-22 DIAGNOSIS — E669 Obesity, unspecified: Secondary | ICD-10-CM | POA: Diagnosis not present

## 2019-11-22 DIAGNOSIS — R69 Illness, unspecified: Secondary | ICD-10-CM | POA: Diagnosis not present

## 2019-11-22 DIAGNOSIS — G8929 Other chronic pain: Secondary | ICD-10-CM | POA: Diagnosis not present

## 2019-11-22 DIAGNOSIS — J439 Emphysema, unspecified: Secondary | ICD-10-CM | POA: Diagnosis not present

## 2019-11-22 DIAGNOSIS — J9611 Chronic respiratory failure with hypoxia: Secondary | ICD-10-CM | POA: Diagnosis not present

## 2019-11-22 DIAGNOSIS — I739 Peripheral vascular disease, unspecified: Secondary | ICD-10-CM | POA: Diagnosis not present

## 2019-11-24 DIAGNOSIS — J449 Chronic obstructive pulmonary disease, unspecified: Secondary | ICD-10-CM | POA: Diagnosis not present

## 2019-12-03 ENCOUNTER — Other Ambulatory Visit: Payer: Self-pay | Admitting: Family Medicine

## 2019-12-20 ENCOUNTER — Ambulatory Visit: Payer: Medicare HMO

## 2019-12-22 DIAGNOSIS — J449 Chronic obstructive pulmonary disease, unspecified: Secondary | ICD-10-CM | POA: Diagnosis not present

## 2019-12-23 ENCOUNTER — Encounter: Payer: Self-pay | Admitting: Family Medicine

## 2019-12-23 NOTE — Telephone Encounter (Signed)
Ok to refill??  Last office visit 09/11/2019.  Last refill 11/19/2019.

## 2019-12-24 MED ORDER — OXYCODONE-ACETAMINOPHEN 5-325 MG PO TABS: 1.0000 | ORAL_TABLET | ORAL | 0 refills | Status: DC | PRN

## 2019-12-31 ENCOUNTER — Encounter: Payer: Self-pay | Admitting: Family Medicine

## 2020-01-04 ENCOUNTER — Ambulatory Visit (INDEPENDENT_AMBULATORY_CARE_PROVIDER_SITE_OTHER): Payer: Medicare HMO | Admitting: Family Medicine

## 2020-01-04 ENCOUNTER — Other Ambulatory Visit: Payer: Self-pay

## 2020-01-04 DIAGNOSIS — J441 Chronic obstructive pulmonary disease with (acute) exacerbation: Secondary | ICD-10-CM

## 2020-01-04 MED ORDER — AMOXICILLIN-POT CLAVULANATE 875-125 MG PO TABS: 1.0000 | ORAL_TABLET | Freq: Two times a day (BID) | ORAL | 0 refills | Status: DC

## 2020-01-04 MED ORDER — PREDNISONE 20 MG PO TABS: ORAL_TABLET | ORAL | 0 refills | Status: DC

## 2020-01-04 NOTE — Progress Notes (Signed)
Subjective:    Patient ID: Emily Owen, female    DOB: [birth year], 86 y.o.   MRN: [id number]  HPI  Patient is being seen today as a telephone call.  Her daughter provides all the history.  Her daughter consents to be seen over the telephone.  Phone call began at 1135.  Phone call concluded at 1148.  Patient has a history of oxygen dependent end-stage COPD.  Daughter states that, the patient is "hard headed".  She will not use her Anoro consistently.  She also is not using the albuterol.  As result she has developed increasing wheezing.  The daughter also reports a rattling sound in her chest whenever she breathes similar to fluid or mucus in her upper airways.  She has been coughing more and recently the cough is now productive of yellow mucus which is different than her baseline.  Daughter states that the patient sits slumped over in her chair the majority of the day.  She also eats slumped over in a poor position and so the daughter is also concerned that she may be aspirating.  Patient has been more confused recently and is also occasionally hallucinating and seeing people who are not there.  Daughter denies any fever.  Daughter denies any chest pain.  She has chronic shortness of breath but this is at her baseline.  She does report increasing chest congestion, increasing wheezing, and increasing cough that is now productive with yellow mucus all of which suggest possible COPD exasperation versus possible aspiration pneumonia. Past Medical History:  Diagnosis Date  . Cancer ([place])    colon  . Cancer of cervix ([place])    cervix  . COPD (chronic obstructive pulmonary disease) ([place])   . Diabetes mellitus without complication ([place])   . Gallstones   . Gout   . Hard of hearing   . On home O2   . Osteoporosis   . Pleural effusion   . Raynaud's disease    Past Surgical History:  Procedure Laterality Date  . ABDOMINAL HYSTERECTOMY     for cervical cancer 1965  . COLON SURGERY     hemicolectomy 1998  . gallstone surgery     ERCP for gallstone pancreatitis  . ORTHOPEDIC SURGERY     Current Outpatient Medications on File Prior to Visit  Medication Sig Dispense Refill  . albuterol (PROVENTIL) (2.5 MG/3ML) 0.083% nebulizer solution Take 3 mLs (2.5 mg total) by nebulization every 6 (six) hours as needed for wheezing or shortness of breath. 75 mL 12  . alendronate (FOSAMAX) 70 MG tablet TAKE 1 TABLET BY MOUTH EVERY 7 DAYS. TAKE WITH A FULL GLASS OF WATER ON AN EMPTY STOMACH. 12 tablet 3  . ANORO ELLIPTA 62.5-25 MCG/INH AEPB INHALE 1 PUFF BY MOUTH EVERY DAY 60 each 5  . escitalopram (LEXAPRO) 10 MG tablet Take 1 tablet (10 mg total) by mouth daily. 30 tablet 5  . furosemide (LASIX) 40 MG tablet TAKE 1 TABLET (40 MG TOTAL) BY MOUTH DAILY AS NEEDED FOR FLUID. (Patient not taking: Reported on 09/02/2019) 30 tablet 3  . gabapentin (NEURONTIN) 100 MG capsule TAKE 2 CAPSULES (200 MG TOTAL) BY MOUTH AT BEDTIME AS NEEDED (FOR NERVE PAIN). 30 capsule 1  . levofloxacin (LEVAQUIN) 500 MG tablet Take 1 tablet (500 mg total) by mouth daily. 7 tablet 0  . meloxicam (MOBIC) 7.5 MG tablet TAKE 1 TABLET BY MOUTH EVERY DAY (Patient not taking: Reported on 09/02/2019) 30 tablet 0  . ondansetron (ZOFRAN ODT)  4 MG disintegrating tablet Take 1 tablet (4 mg total) by mouth every 8 (eight) hours as needed for nausea or vomiting. (Patient not taking: Reported on 09/02/2019) 20 tablet 0  . oxyCODONE-acetaminophen (PERCOCET) 5-325 MG tablet Take 1 tablet by mouth every 4 (four) hours as needed for severe pain. 30 tablet 0  . OXYGEN Inhale 3 L into the lungs continuous. [facility]    . pantoprazole (PROTONIX) 40 MG tablet Take 1 tablet (40 mg total) by mouth daily. (Patient not taking: Reported on 09/02/2019) 30 tablet 3  . Respiratory Therapy Supplies (FLUTTER) DEVI Use as directed. 1 each 0   No current facility-administered medications on file prior to visit.   No Known Allergies Social History    Socioeconomic History  . Marital status: Widowed    Spouse name: Not on file  . Number of children: Not on file  . Years of education: Not on file  . Highest education level: Not on file  Occupational History  . Not on file  Tobacco Use  . Smoking status: Former Smoker    Packs/day: 2.00    Years: 35.00    Pack years: 70.00    Types: Cigarettes    Quit date: 08/11/1994    Years since quitting: 25.4  . Smokeless tobacco: Never Used  Substance and Sexual Activity  . Alcohol use: No    Alcohol/week: 0.0 standard drinks  . Drug use: No  . Sexual activity: Not on file  Other Topics Concern  . Not on file  Social History Narrative  . Not on file   Social Determinants of Health   Financial Resource Strain:   . Difficulty of Paying Living Expenses:   Food Insecurity:   . Worried About Charity fundraiser in the Last Year:   . Arboriculturist in the Last Year:   Transportation Needs:   . Film/video editor (Medical):   Marland Kitchen Lack of Transportation (Non-Medical):   Physical Activity:   . Days of Exercise per Week:   . Minutes of Exercise per Session:   Stress:   . Feeling of Stress :   Social Connections:   . Frequency of Communication with Friends and Family:   . Frequency of Social Gatherings with Friends and Family:   . Attends Religious Services:   . Active Member of Clubs or Organizations:   . Attends Archivist Meetings:   Marland Kitchen Marital Status:   Intimate Partner Violence:   . Fear of Current or Ex-Partner:   . Emotionally Abused:   Marland Kitchen Physically Abused:   . Sexually Abused:      Review of Systems  All other systems reviewed and are negative.      Objective:   Physical Exam        Assessment & Plan:  COPD exacerbation ([place])  Situation is complicated by the fact that I am unable to perform a physical exam.  Due to the patient's frailty and weakness she is unable to leave the house that she is virtually confined to a chair.  Therefore the only  other option would be to take her to the emergency room via ambulance and the patient's daughters wish to avoid this if at all possible.  Therefore I recommended treating the patient as though she may have aspiration pneumonia with Augmentin 875 mg twice daily for 10 days.  We will add prednisone taper pack due to her wheezing.  I recommended that she start using the Anoro once a  day and use albuterol 2.5 mg nebs 2-3 times a day scheduled in addition to Mucinex.  If the situation worsens at all she needs to go to the emergency room.  Patient's family is in agreement.

## 2020-01-22 DIAGNOSIS — J449 Chronic obstructive pulmonary disease, unspecified: Secondary | ICD-10-CM | POA: Diagnosis not present

## 2020-01-24 ENCOUNTER — Other Ambulatory Visit: Payer: Self-pay | Admitting: Family Medicine

## 2020-01-27 ENCOUNTER — Encounter: Payer: Self-pay | Admitting: Family Medicine

## 2020-01-28 MED ORDER — OXYCODONE-ACETAMINOPHEN 5-325 MG PO TABS: 1.0000 | ORAL_TABLET | ORAL | 0 refills | Status: DC | PRN

## 2020-01-28 NOTE — Telephone Encounter (Signed)
Ok to refill??  Last office visit 01/04/2020.  Last refill 12/24/2019.

## 2020-02-12 ENCOUNTER — Telehealth (INDEPENDENT_AMBULATORY_CARE_PROVIDER_SITE_OTHER): Payer: Medicare HMO | Admitting: Family Medicine

## 2020-02-12 ENCOUNTER — Other Ambulatory Visit: Payer: Self-pay

## 2020-02-12 DIAGNOSIS — J449 Chronic obstructive pulmonary disease, unspecified: Secondary | ICD-10-CM

## 2020-02-12 MED ORDER — AMOXICILLIN-POT CLAVULANATE ER 1000-62.5 MG PO TB12: 2.0000 | ORAL_TABLET | Freq: Two times a day (BID) | ORAL | 0 refills | Status: AC

## 2020-02-12 NOTE — Progress Notes (Signed)
Subjective:    Patient ID: Emily Owen, female    DOB: 02-02-1930, 86 y.o.   MRN: [id number]  HPI   Patient is being seen today as a video conference call along with her daughter.  They consented to be seen via video conference call.  To call began at 1150.  The call concluded at 1206.  Patient's daughter states that the patient was coughing more over the last 2 days.  She also sounds more congestion.  Over the last 2 to 3 days she is beginning to sleep constantly.  She is no longer eating very much.  She is not drinking very much.  Along with increased cough she is reporting increasing shortness of breath.  Yesterday she was more delirious.  Today I am quite taken aback by the patient's parents since I last saw her.  She is confined to her recliner at home.  She appears very sleepy.  She falls asleep several times during our encounter.  She does not appear to be in any respiratory distress.  She is breathing comfortably.  She is not coughing during our encounter.  Her daughter states that she is afebrile.  She is 93% on her oxygen with her pulse ox.  However she does appear to be more lethargic.  She is also losing weight.  I believe that she is now end-stage.  Daughter states that she sits slumped over to the right majority of the day and that occasionally she gets choked when she eats putting her at high risk for aspiration into the right lung.  They bring the phone close to the patient's chest while she is breathing.  She has dramatic upper airway breath sounds with rattling and mucus.  I am unable to appreciate however if she may have rails in her lungs.  There is no pitting edema in her lower extremities that I can visibly see over the telephone however.  She certainly does not appear to be fluid overloaded.  The rhonchorous breath sounds lead me to be concerned about possible upper airway mucus and atelectasis versus pneumonia. Past Medical History:  Diagnosis Date  . Cancer ([place])    colon  . Cancer of cervix ([place])    cervix  . COPD (chronic obstructive pulmonary disease) ([place])   . Diabetes mellitus without complication ([place])   . Gallstones   . Gout   . Hard of hearing   . On home O2   . Osteoporosis   . Pleural effusion   . Raynaud's disease    Past Surgical History:  Procedure Laterality Date  . ABDOMINAL HYSTERECTOMY     for cervical cancer 1965  . COLON SURGERY     hemicolectomy 1998  . gallstone surgery     ERCP for gallstone pancreatitis  . ORTHOPEDIC SURGERY     Current Outpatient Medications on File Prior to Visit  Medication Sig Dispense Refill  . alendronate (FOSAMAX) 70 MG tablet TAKE 1 TABLET BY MOUTH EVERY 7 DAYS. TAKE WITH A FULL GLASS OF WATER ON AN EMPTY STOMACH. 12 tablet 3  . escitalopram (LEXAPRO) 10 MG tablet TAKE 1 TABLET BY MOUTH EVERY DAY 30 tablet 5  . gabapentin (NEURONTIN) 100 MG capsule TAKE 2 CAPSULES (200 MG TOTAL) BY MOUTH AT BEDTIME AS NEEDED (FOR NERVE PAIN). 30 capsule 1  . albuterol (PROVENTIL) (2.5 MG/3ML) 0.083% nebulizer solution Take 3 mLs (2.5 mg total) by nebulization every 6 (six) hours as needed for wheezing or shortness of breath. 75 mL  12  . ANORO ELLIPTA 62.5-25 MCG/INH AEPB INHALE 1 PUFF BY MOUTH EVERY DAY 60 each 5  . furosemide (LASIX) 40 MG tablet TAKE 1 TABLET (40 MG TOTAL) BY MOUTH DAILY AS NEEDED FOR FLUID. (Patient not taking: Reported on 09/02/2019) 30 tablet 3  . levofloxacin (LEVAQUIN) 500 MG tablet Take 1 tablet (500 mg total) by mouth daily. (Patient not taking: Reported on 02/12/2020) 7 tablet 0  . ondansetron (ZOFRAN ODT) 4 MG disintegrating tablet Take 1 tablet (4 mg total) by mouth every 8 (eight) hours as needed for nausea or vomiting. (Patient not taking: Reported on 09/02/2019) 20 tablet 0  . oxyCODONE-acetaminophen (PERCOCET) 5-325 MG tablet Take 1 tablet by mouth every 4 (four) hours as needed for severe pain. 30 tablet 0  . OXYGEN Inhale 3 L into the lungs continuous. [facility]    . pantoprazole  (PROTONIX) 40 MG tablet Take 1 tablet (40 mg total) by mouth daily. (Patient not taking: Reported on 09/02/2019) 30 tablet 3  . Respiratory Therapy Supplies (FLUTTER) DEVI Use as directed. 1 each 0   No current facility-administered medications on file prior to visit.   No Known Allergies Social History   Socioeconomic History  . Marital status: Widowed    Spouse name: Not on file  . Number of children: Not on file  . Years of education: Not on file  . Highest education level: Not on file  Occupational History  . Not on file  Tobacco Use  . Smoking status: Former Smoker    Packs/day: 2.00    Years: 35.00    Pack years: 70.00    Types: Cigarettes    Quit date: 08/11/1994    Years since quitting: 25.5  . Smokeless tobacco: Never Used  Substance and Sexual Activity  . Alcohol use: No    Alcohol/week: 0.0 standard drinks  . Drug use: No  . Sexual activity: Not on file  Other Topics Concern  . Not on file  Social History Narrative  . Not on file   Social Determinants of Health   Financial Resource Strain:   . Difficulty of Paying Living Expenses:   Food Insecurity:   . Worried About Charity fundraiser in the Last Year:   . Arboriculturist in the Last Year:   Transportation Needs:   . Film/video editor (Medical):   Marland Kitchen Lack of Transportation (Non-Medical):   Physical Activity:   . Days of Exercise per Week:   . Minutes of Exercise per Session:   Stress:   . Feeling of Stress :   Social Connections:   . Frequency of Communication with Friends and Family:   . Frequency of Social Gatherings with Friends and Family:   . Attends Religious Services:   . Active Member of Clubs or Organizations:   . Attends Archivist Meetings:   Marland Kitchen Marital Status:   Intimate Partner Violence:   . Fear of Current or Ex-Partner:   . Emotionally Abused:   Marland Kitchen Physically Abused:   . Sexually Abused:      Review of Systems  All other systems reviewed and are negative.       Objective:   Physical Exam         Assessment & Plan:  End stage COPD ([place]) - Plan: Ambulatory referral to Hospice, amoxicillin-clavulanate (AUGMENTIN XR) 1000-62.5 MG 12 hr tablet  Patient appears sleepy and lethargic.  She is losing weight.  She is no longer eating and drinking very  much.  She is requiring around-the-clock care from her 2 daughters.  I believe she is appropriate for hospice as I do not feel that the end is long.  I anticipate that the patient's life expectancy is less than 6 months.  I am concerned that she may be developing community-acquired pneumonia or possible aspiration pneumonia based on her increased cough and audible congestion I can hear over the video conference call.  Daughters do not believe that she has been exposed to Covid as they are the only people caring for her and she does not leave the house and they have been vaccinated.  Therefore I will treat the patient for possible community-acquired pneumonia and aspiration pneumonia with Augmentin 2 tablets twice daily for 10 days.  Reassess next week.  I will also consult hospice.  I discussed this with the patient and her daughter and they are willing to have hospice come in for any assistance they can provide in her care.  They are both aware of the situation and realize the patient's life expectancy is short.  However I have been very impressed by the amount of care they are providing for the patient at home.

## 2020-02-21 DIAGNOSIS — J449 Chronic obstructive pulmonary disease, unspecified: Secondary | ICD-10-CM | POA: Diagnosis not present

## 2020-03-08 ENCOUNTER — Other Ambulatory Visit: Payer: Self-pay | Admitting: Family Medicine

## 2020-03-08 MED ORDER — OXYCODONE-ACETAMINOPHEN 5-325 MG PO TABS: 1.0000 | ORAL_TABLET | ORAL | 0 refills | Status: DC | PRN

## 2020-03-08 NOTE — Telephone Encounter (Signed)
Patient requesting a refill on Oxycodone     LOV:  02/12/2020  LRF:   01/28/2020

## 2020-03-10 ENCOUNTER — Other Ambulatory Visit: Payer: Self-pay | Admitting: Family Medicine

## 2020-03-15 ENCOUNTER — Other Ambulatory Visit: Payer: Self-pay

## 2020-03-15 ENCOUNTER — Encounter (HOSPITAL_COMMUNITY): Payer: Self-pay

## 2020-03-15 ENCOUNTER — Emergency Department (HOSPITAL_COMMUNITY)
Admission: EM | Admit: 2020-03-15 | Discharge: 2020-03-15 | Disposition: A | Discharge status: Home / Self Care | Attending: Emergency Medicine | Admitting: Emergency Medicine

## 2020-03-15 ENCOUNTER — Emergency Department (HOSPITAL_COMMUNITY)

## 2020-03-15 DIAGNOSIS — E119 Type 2 diabetes mellitus without complications: Secondary | ICD-10-CM | POA: Diagnosis not present

## 2020-03-15 DIAGNOSIS — Z20822 Contact with and (suspected) exposure to covid-19: Secondary | ICD-10-CM | POA: Insufficient documentation

## 2020-03-15 DIAGNOSIS — Z79899 Other long term (current) drug therapy: Secondary | ICD-10-CM | POA: Insufficient documentation

## 2020-03-15 DIAGNOSIS — I10 Essential (primary) hypertension: Secondary | ICD-10-CM | POA: Diagnosis not present

## 2020-03-15 DIAGNOSIS — Z7984 Long term (current) use of oral hypoglycemic drugs: Secondary | ICD-10-CM | POA: Diagnosis not present

## 2020-03-15 DIAGNOSIS — R404 Transient alteration of awareness: Secondary | ICD-10-CM | POA: Diagnosis not present

## 2020-03-15 DIAGNOSIS — Z87891 Personal history of nicotine dependence: Secondary | ICD-10-CM | POA: Diagnosis not present

## 2020-03-15 DIAGNOSIS — R069 Unspecified abnormalities of breathing: Secondary | ICD-10-CM | POA: Diagnosis not present

## 2020-03-15 DIAGNOSIS — Z515 Encounter for palliative care: Secondary | ICD-10-CM

## 2020-03-15 DIAGNOSIS — J449 Chronic obstructive pulmonary disease, unspecified: Secondary | ICD-10-CM | POA: Insufficient documentation

## 2020-03-15 DIAGNOSIS — G9340 Encephalopathy, unspecified: Secondary | ICD-10-CM | POA: Insufficient documentation

## 2020-03-15 DIAGNOSIS — Z03818 Encounter for observation for suspected exposure to other biological agents ruled out: Secondary | ICD-10-CM | POA: Diagnosis not present

## 2020-03-15 DIAGNOSIS — R4182 Altered mental status, unspecified: Secondary | ICD-10-CM | POA: Diagnosis present

## 2020-03-15 LAB — COMPREHENSIVE METABOLIC PANEL
ALT: 7 U/L (ref 0–44)
AST: 15 U/L (ref 15–41)
Albumin: 3.8 g/dL (ref 3.5–5.0)
Alkaline Phosphatase: 57 U/L (ref 38–126)
Anion gap: 15 (ref 5–15)
BUN: 15 mg/dL (ref 8–23)
CO2: 35 mmol/L — ABNORMAL HIGH (ref 22–32)
Calcium: 8.9 mg/dL (ref 8.9–10.3)
Chloride: 91 mmol/L — ABNORMAL LOW (ref 98–111)
Creatinine, Ser: 0.85 mg/dL (ref 0.44–1.00)
GFR calc Af Amer: >60 mL/min (ref >60)
GFR calc non Af Amer: >60 mL/min (ref >60)
Glucose, Bld: 100 mg/dL — ABNORMAL HIGH (ref 70–99)
Potassium: 4.4 mmol/L (ref 3.5–5.1)
Sodium: 141 mmol/L (ref 135–145)
Total Bilirubin: 0.8 mg/dL (ref 0.3–1.2)
Total Protein: 6.5 g/dL (ref 6.5–8.1)

## 2020-03-15 LAB — CBC WITH DIFFERENTIAL/PLATELET
Abs Immature Granulocytes: 0.01 10*3/uL (ref 0.00–0.07)
Basophils Absolute: 0.1 10*3/uL (ref 0.0–0.1)
Basophils Relative: 1 %
Eosinophils Absolute: 0.3 10*3/uL (ref 0.0–0.5)
Eosinophils Relative: 4 %
HCT: 37.9 % (ref 36.0–46.0)
Hemoglobin: 11.2 g/dL — ABNORMAL LOW (ref 12.0–15.0)
Immature Granulocytes: 0 %
Lymphocytes Relative: 17 %
Lymphs Abs: 1.2 10*3/uL (ref 0.7–4.0)
MCH: 29.2 pg (ref 26.0–34.0)
MCHC: 29.6 g/dL — ABNORMAL LOW (ref 30.0–36.0)
MCV: 99 fL (ref 80.0–100.0)
Monocytes Absolute: 0.6 10*3/uL (ref 0.1–1.0)
Monocytes Relative: 9 %
Neutro Abs: 5.1 10*3/uL (ref 1.7–7.7)
Neutrophils Relative %: 69 %
Platelets: 159 10*3/uL (ref 150–400)
RBC: 3.83 MIL/uL — ABNORMAL LOW (ref 3.87–5.11)
RDW: 15 % (ref 11.5–15.5)
WBC: 7.3 10*3/uL (ref 4.0–10.5)
nRBC: 0 % (ref 0.0–0.2)

## 2020-03-15 LAB — URINALYSIS, ROUTINE W REFLEX MICROSCOPIC
Bilirubin Urine: NEGATIVE
Glucose, UA: NEGATIVE mg/dL
Hgb urine dipstick: NEGATIVE
Ketones, ur: 20 mg/dL — AB
Leukocytes,Ua: NEGATIVE
Nitrite: NEGATIVE
Protein, ur: NEGATIVE mg/dL
Specific Gravity, Urine: 1.014 (ref 1.005–1.030)
pH: 6 (ref 5.0–8.0)

## 2020-03-15 LAB — SARS CORONAVIRUS 2 BY RT PCR (HOSPITAL ORDER, PERFORMED IN ~~LOC~~ HOSPITAL LAB): SARS Coronavirus 2: NEGATIVE

## 2020-03-15 LAB — AMMONIA: Ammonia: 14 umol/L (ref 9–35)

## 2020-03-15 LAB — TSH: TSH: 2.04 u[IU]/mL (ref 0.350–4.500)

## 2020-03-15 LAB — ETHANOL: Alcohol, Ethyl (B): <10 mg/dL (ref <10)

## 2020-03-15 MED ORDER — SODIUM CHLORIDE 0.9 % IV BOLUS
500.0000 mL | Freq: Once | INTRAVENOUS | Status: AC
  Administered 2020-03-15: 500 mL via INTRAVENOUS

## 2020-03-15 NOTE — ED Triage Notes (Signed)
Pt brought from home. Daughter reported that she has been altered for 5 days with worsening the last 3 days. Not eating well or taking meds. She has a bed sore on her bottom and is hospice care. She has bruising to left pointer due to fx from fall

## 2020-03-15 NOTE — ED Provider Notes (Signed)
[facility] EMERGENCY DEPARTMENT Provider Note   CSN: [id number] Arrival date & time: 03/15/20  [id number]     History Chief Complaint  Patient presents with  . Altered Mental Status    Emily Owen is a 86 y.o. female.  Patient with history of colon cancer, COPD, diabetes, osteoporosis, current palliative care, sepsis history, COPD, DNR presents with altered mental status worsening for the past 5 days.  No witnessed head injury.  Patient lives at home with family.  Patient has known pressure ulcers.  No fever or infectious symptoms.  Difficulty obtaining any details from patient.        Past Medical History:  Diagnosis Date  . Cancer ([place])    colon  . Cancer of cervix ([place])    cervix  . COPD (chronic obstructive pulmonary disease) ([place])   . Diabetes mellitus without complication ([place])   . Gallstones   . Gout   . Hard of hearing   . On home O2   . Osteoporosis   . Pleural effusion   . Raynaud's disease     Patient Active Problem List   Diagnosis Date Noted  . Elevated troponin 09/02/2019  . Altered mental status 07/26/2019  . Aspiration into airway 02/09/2019  . Chronic obstructive pulmonary disease with acute exacerbation ([place])   . Acute on chronic respiratory failure with hypoxia and hypercapnia (HCC)   . Palliative care by specialist   . Advanced care planning/counseling discussion   . Goals of care, counseling/discussion   . AMS (altered mental status) 02/07/2019  . DNR (do not resuscitate) 01/27/2019  . Pressure injury of skin 01/15/2019  . Severe sepsis ([place]) 01/15/2019  . RLL pneumonia 01/15/2019  . Generalized weakness 01/15/2019  . Left upper lobe pulmonary nodule 01/15/2019  . Raynaud's disease   . Pancreatitis 02/13/2018  . Routine general medical examination at a health care facility 07/19/2017  . Gout 03/14/2016  . Atherosclerosis of aorta ([place]) 06/11/2015  . Diabetes mellitus with peripheral vascular disease ([place])   . Chronic respiratory  failure with hypercapnia ([place]) 06/02/2015  . COPD, severe ([place]) 03/25/2015  . Pleural effusion on right 03/25/2015  . COPD with acute exacerbation ([place]) 08/11/2014  . HTN (hypertension) 08/11/2014  . Acute on chronic respiratory failure with hypoxia ([place]) 08/22/2012  . Hyperlipidemia 08/20/2012    Past Surgical History:  Procedure Laterality Date  . ABDOMINAL HYSTERECTOMY     for cervical cancer 1965  . COLON SURGERY     hemicolectomy 1998  . gallstone surgery     ERCP for gallstone pancreatitis  . ORTHOPEDIC SURGERY       OB History    Gravida  6   Para  6   Term  6   Preterm      AB      Living  5     SAB      TAB      Ectopic      Multiple      Live Births              Family History  Problem Relation Age of Onset  . Diabetes Sister   . Diabetes Sister   . Diabetes Sister   . Hypertension Mother   . Heart disease Mother   . Diabetes Sister     Social History   Tobacco Use  . Smoking status: Former Smoker    Packs/day: 2.00    Years: 35.00    Pack years: 70.00  Types: Cigarettes    Quit date: 08/11/1994    Years since quitting: 25.6  . Smokeless tobacco: Never Used  Substance Use Topics  . Alcohol use: No    Alcohol/week: 0.0 standard drinks  . Drug use: No    Home Medications Prior to Admission medications   Medication Sig Start Date End Date Taking? Authorizing Provider  acetaminophen (TYLENOL) 500 MG tablet Take 500 mg by mouth every 6 (six) hours as needed for moderate pain.   Yes [provider]  albuterol (PROVENTIL) (2.5 MG/3ML) 0.083% nebulizer solution Take 3 mLs (2.5 mg total) by nebulization every 6 (six) hours as needed for wheezing or shortness of breath. 07/28/19  Yes Emokpae, Courage, MD  alendronate (FOSAMAX) 70 MG tablet TAKE 1 TABLET BY MOUTH EVERY 7 DAYS. TAKE WITH A FULL GLASS OF WATER ON AN EMPTY STOMACH. Patient taking differently: Take 70 mg by mouth once a week. On sundays 12/03/19  Yes [name], MD  ANORO ELLIPTA 62.5-25 MCG/INH AEPB INHALE 1 PUFF BY MOUTH EVERY DAY 11/18/19  Yes [name], MD  escitalopram (LEXAPRO) 10 MG tablet TAKE 1 TABLET BY MOUTH EVERY DAY 03/10/20  Yes [name], MD  oxyCODONE-acetaminophen (PERCOCET) 5-325 MG tablet Take 1 tablet by mouth every 4 (four) hours as needed for severe pain. Patient taking differently: Take 1 tablet by mouth daily as needed for severe pain.  03/08/20  Yes [name], MD  amoxicillin-clavulanate (AUGMENTIN XR) 1000-62.5 MG 12 hr tablet Take 2 tablets by mouth 2 (two) times daily. Patient not taking: Reported on 03/15/2020 02/12/20   [name], MD  furosemide (LASIX) 40 MG tablet TAKE 1 TABLET (40 MG TOTAL) BY MOUTH DAILY AS NEEDED FOR FLUID. Patient not taking: Reported on 09/02/2019 07/27/19   [name], MD  gabapentin (NEURONTIN) 100 MG capsule TAKE 2 CAPSULES (200 MG TOTAL) BY MOUTH AT BEDTIME AS NEEDED (FOR NERVE PAIN). Patient not taking: Reported on 03/15/2020 11/16/19   [name], MD  levofloxacin (LEVAQUIN) 500 MG tablet Take 1 tablet (500 mg total) by mouth daily. Patient not taking: Reported on 02/12/2020 09/02/19   Fredia Sorrow, MD  LORazepam (ATIVAN) 0.5 MG tablet Take 0.5 mg by mouth every 3 (three) hours as needed. 03/01/20   [provider]  ondansetron (ZOFRAN ODT) 4 MG disintegrating tablet Take 1 tablet (4 mg total) by mouth every 8 (eight) hours as needed for nausea or vomiting. Patient not taking: Reported on 09/02/2019 06/03/19   Robinson, [place] N, PA-C  OXYGEN Inhale 3 L into the lungs continuous. [employer]    [provider]  pantoprazole (PROTONIX) 40 MG tablet Take 1 tablet (40 mg total) by mouth daily. Patient not taking: Reported on 09/02/2019 06/09/19   [name], MD  Respiratory Therapy Supplies (FLUTTER) DEVI Use as directed. Patient not taking: Reported on 03/15/2020 10/01/18   Parrett, Fonnie Mu, NP    Allergies    Patient has no  known allergies.  Review of Systems   Review of Systems  Unable to perform ROS: Mental status change    Physical Exam Updated Vital Signs BP 132/66   Pulse 73   Temp 98.7 F (37.1 C) (Oral)   Resp 15   Ht 5\' 3"  (1.6 m)   Wt 72.9 kg   SpO2 99%   BMI 28.47 kg/m   Physical Exam Vitals and nursing note reviewed.  Constitutional:      Appearance: She is well-developed.  HENT:     Head: Normocephalic and atraumatic.     Comments: Dry mm    Mouth/Throat:     Mouth: Mucous membranes are dry.  Eyes:     General:        Right eye: No discharge.        Left eye: No discharge.     Conjunctiva/sclera: Conjunctivae normal.  Neck:     Trachea: No tracheal deviation.  Cardiovascular:     Rate and Rhythm: Normal rate and regular rhythm.  Pulmonary:     Effort: Pulmonary effort is normal.     Breath sounds: Normal breath sounds.  Abdominal:     General: There is no distension.     Palpations: Abdomen is soft.     Tenderness: There is no abdominal tenderness. There is no guarding.  Musculoskeletal:     Cervical back: Normal range of motion and neck supple.  Skin:    General: Skin is warm.     Capillary Refill: Capillary refill takes less than 2 seconds.     Findings: Rash present.  Neurological:     Mental Status: She is alert.     GCS: GCS eye subscore is 4. GCS verbal subscore is 4. GCS motor subscore is 6.     Comments: Intermittently patient will answer questions, general fatigue appearance.  Patient follows commands to loud verbal discussion.  Extraocular muscle function intact.  Equal strength upper and lower extremities.  No obvious facial droop.  Psychiatric:     Comments: Mild confusion     ED Results / Procedures / Treatments   Labs (all labs ordered are listed, but only abnormal results are displayed) Labs Reviewed  URINALYSIS, ROUTINE W REFLEX MICROSCOPIC - Abnormal; Notable for the following components:      Result Value   Ketones, ur 20 (*)    All other  components within normal limits  COMPREHENSIVE METABOLIC PANEL - Abnormal; Notable for the following components:   Chloride 91 (*)    CO2 35 (*)    Glucose, Bld 100 (*)    All other components within normal limits  CBC WITH DIFFERENTIAL/PLATELET - Abnormal; Notable for the following components:   RBC 3.83 (*)    Hemoglobin 11.2 (*)    MCHC 29.6 (*)    All other components within normal limits  SARS CORONAVIRUS 2 BY RT PCR (HOSPITAL ORDER, [facility])  URINE CULTURE  ETHANOL  AMMONIA  TSH  CBC WITH DIFFERENTIAL/PLATELET    EKG None  Radiology CT Head Wo Contrast  Result Date: 03/15/2020 CLINICAL DATA:  Altered mental status for 5 days worsening over the last 3 days. EXAM: CT HEAD WITHOUT CONTRAST TECHNIQUE: Contiguous axial images were obtained from the base of the skull through the vertex without intravenous contrast. COMPARISON:  02/07/2019 FINDINGS: Brain: No evidence of acute infarction, hemorrhage, hydrocephalus, extra-axial collection or mass lesion/mass effect. There is ventricular and sulcal enlargement reflecting mild diffuse atrophy. Patchy white matter hypoattenuation is noted bilaterally consistent with advanced chronic microvascular ischemic change. These findings are stable. Vascular: No hyperdense vessel or unexpected calcification. Skull: No fracture or acute finding.  No bone lesion. Sinuses/Orbits: Globes and orbits are unremarkable. Sinuses are clear. Other: None. IMPRESSION: 1. No acute intracranial abnormalities. 2. Atrophy and advanced chronic microvascular ischemic change stable from the prior head CT. Electronically Signed   By: Lajean Manes M.D.   On: 03/15/2020 10:19   DG Chest Portable 1 View  Result Date:  03/15/2020 CLINICAL DATA:  AMS x several days. Hx diabetes, COPD, pleural effusion, former smoker, COVID test ordered - results pending. EXAM: PORTABLE CHEST 1 VIEW COMPARISON:  09/02/2019 and older exams. FINDINGS: Cardiac  silhouette is normal in size. No mediastinal or hilar masses. No evidence of adenopathy. There is volume loss on the right with mediastinal shift to the right. Hazy and linear opacities are noted at the right lung base associated with elevation of the right hemidiaphragm, findings improved from the prior exam consistent with chronic scarring/atelectasis. Left lung is hyperexpanded. There are bilateral prominent bronchovascular markings that are stable. No convincing pneumonia or pulmonary edema. Probable small chronic right pleural effusion. No convincing left pleural effusion and no pneumothorax. Skeletal structures are grossly intact. IMPRESSION: No acute cardiopulmonary disease. Electronically Signed   By: Lajean Manes M.D.   On: 03/15/2020 10:10    Procedures Procedures (including critical care time)  Medications Ordered in ED Medications  sodium chloride 0.9 % bolus 500 mL (0 mLs Intravenous Stopped 03/15/20 1327)    ED Course  I have reviewed the triage vital signs and the nursing notes.  Pertinent labs & imaging results that were available during my care of the patient were reviewed by me and considered in my medical decision making (see chart for details).    MDM Rules/Calculators/A&P                      Patient presents from home with confusion from last 5 days.  No focal deficits on exam.  Plan for general work-up including blood work, metabolic, urinalysis to look for infection, CT scan of the head for looking intracranial, chest x-ray to look for signs of pneumonia.  Covid test pending.  CT scan of the head results reviewed no acute abnormalities.  Chest x-ray reviewed no acute process.   Blood work reviewed glucose 100, hemoglobin 11.2, normal white blood cell count, mild ketones in urine.  IV fluid bolus given.  CT scan of the head no acute abnormalities.  Chest x-ray no acute abnormalities.  Reviewed.  Urinalysis no sign of infection reviewed.  Covid negative.  Patient's  daughter arrived and we discussed in detail the results and goals of care.  Recently transition to hospice care.  Daughter says that the patient has been discussing end-of-life for the past few years.  We agreed that the goal is to not do any aggressive care no admission, no antibiotics.  Hospice care was consulted and no beds available at this time but they will follow-up close with patient at home.  Patient discharged.   TONYE CALLIGAN was evaluated in Emergency Department on 03/15/2020 for the symptoms described in the history of present illness. She was evaluated in the context of the global COVID-19 pandemic, which necessitated consideration that the patient might be at risk for infection with the SARS-CoV-2 virus that causes COVID-19. Institutional protocols and algorithms that pertain to the evaluation of patients at risk for COVID-19 are in a state of rapid change based on information released by regulatory bodies including the CDC and federal and state organizations. These policies and algorithms were followed during the patient's care in the ED.  Final Clinical Impression(s) / ED Diagnoses Final diagnoses:  Acute encephalopathy  Hospice care    Rx / DC Orders ED Discharge Orders    None       Elnora Onstad, MD 03/15/20 1353

## 2020-03-15 NOTE — ED Notes (Signed)
Reviewed instructions with daughter

## 2020-03-15 NOTE — ED Notes (Signed)
Spoke with Emily Owen reports that pt would be placed on waiting list. Hospice house currently full at this time

## 2020-03-15 NOTE — ED Notes (Signed)
Sacral patch placed to bottom

## 2020-03-15 NOTE — Discharge Instructions (Addendum)
Follow-up closely with hospice care.

## 2020-03-16 LAB — URINE CULTURE: Culture: NO GROWTH

## 2020-03-18 DIAGNOSIS — R0902 Hypoxemia: Secondary | ICD-10-CM | POA: Diagnosis not present

## 2020-03-18 DIAGNOSIS — R410 Disorientation, unspecified: Secondary | ICD-10-CM | POA: Diagnosis not present

## 2020-03-18 DIAGNOSIS — Z7401 Bed confinement status: Secondary | ICD-10-CM | POA: Diagnosis not present

## 2020-03-24 DIAGNOSIS — I959 Hypotension, unspecified: Secondary | ICD-10-CM | POA: Diagnosis not present

## 2020-03-24 DIAGNOSIS — Z7401 Bed confinement status: Secondary | ICD-10-CM | POA: Diagnosis not present

## 2020-03-24 DIAGNOSIS — R52 Pain, unspecified: Secondary | ICD-10-CM | POA: Diagnosis not present

## 2020-03-31 ENCOUNTER — Other Ambulatory Visit (HOSPITAL_COMMUNITY)
Admission: RE | Admit: 2020-03-31 | Discharge: 2020-03-31 | Disposition: A | Discharge status: Home / Self Care | Payer: Medicare Other | Source: Ambulatory Visit | Attending: Hematology and Oncology | Admitting: Hematology and Oncology

## 2020-03-31 ENCOUNTER — Telehealth: Payer: Self-pay

## 2020-03-31 DIAGNOSIS — N390 Urinary tract infection, site not specified: Secondary | ICD-10-CM | POA: Diagnosis present

## 2020-03-31 LAB — URINALYSIS, ROUTINE W REFLEX MICROSCOPIC
Bilirubin Urine: NEGATIVE
Glucose, UA: NEGATIVE mg/dL
Ketones, ur: NEGATIVE mg/dL
Nitrite: POSITIVE — AB
Protein, ur: 100 mg/dL — AB
Specific Gravity, Urine: 1.012 (ref 1.005–1.030)
WBC, UA: >50 WBC/hpf — ABNORMAL HIGH (ref 0–5)
pH: 5 (ref 5.0–8.0)

## 2020-03-31 NOTE — Telephone Encounter (Signed)
Emily Owen in [facility] UTI symptoms, foly is getting discolored urine, side pain, frequent urination, hospice just needed verbal order for antibiotic. Family also requested rx for spasms.

## 2020-04-01 ENCOUNTER — Telehealth: Payer: Self-pay | Admitting: Family Medicine

## 2020-04-01 LAB — URINE CULTURE

## 2020-04-01 MED ORDER — CIPROFLOXACIN HCL 500 MG PO TABS
500.0000 mg | ORAL_TABLET | Freq: Two times a day (BID) | ORAL | 0 refills | Status: AC
Start: 2020-04-01 — End: ?

## 2020-04-01 MED ORDER — OXYBUTYNIN CHLORIDE 5 MG PO TABS: 5.0000 mg | ORAL_TABLET | Freq: Two times a day (BID) | ORAL | 0 refills | Status: AC | PRN

## 2020-04-01 NOTE — Telephone Encounter (Signed)
Spoke with patient's daughter and was told that hospice was sending in and antibiotic and a bladder spasm medication for patient.

## 2020-04-01 NOTE — Telephone Encounter (Signed)
     The note was not clear, but I saw UTI in the chart    Pt on hospice    Start cipro 500mg  BID x 7 days  Assuming family was refrencing bladder spasm, I sent ditropan 5mg  BID prn spasm while on antibiotic     If different spasm- please clarify    If they were talking about back spasm, they can try zanaflex 4mg  BID prn     #30 R 0

## 2020-04-01 NOTE — Telephone Encounter (Signed)
Called patient daughter and was told that Hospice was calling in an antibiotic and a bladder spasm medication for patient

## 2020-04-01 NOTE — Addendum Note (Signed)
Addended by: Vic Blackbird F on: 04/01/2020 10:07 AM   Modules accepted: Orders

## 2020-04-07 DIAGNOSIS — Z20822 Contact with and (suspected) exposure to covid-19: Secondary | ICD-10-CM | POA: Diagnosis not present

## 2020-04-19 ENCOUNTER — Other Ambulatory Visit: Payer: Self-pay | Admitting: Family Medicine

## 2020-04-19 NOTE — Telephone Encounter (Signed)
Ok to refill??  Last office visit 02/12/2020.  Last refill 03/08/2020.

## 2020-04-27 DIAGNOSIS — Z1152 Encounter for screening for COVID-19: Secondary | ICD-10-CM | POA: Diagnosis not present

## 2020-04-27 DIAGNOSIS — Z20822 Contact with and (suspected) exposure to covid-19: Secondary | ICD-10-CM | POA: Diagnosis not present

## 2020-05-06 DIAGNOSIS — Z20822 Contact with and (suspected) exposure to covid-19: Secondary | ICD-10-CM | POA: Diagnosis not present

## 2020-05-21 DIAGNOSIS — Z20822 Contact with and (suspected) exposure to covid-19: Secondary | ICD-10-CM | POA: Diagnosis not present

## 2020-05-21 DIAGNOSIS — Z1152 Encounter for screening for COVID-19: Secondary | ICD-10-CM | POA: Diagnosis not present

## 2020-05-25 DIAGNOSIS — Z20822 Contact with and (suspected) exposure to covid-19: Secondary | ICD-10-CM | POA: Diagnosis not present

## 2020-05-25 DIAGNOSIS — Z1152 Encounter for screening for COVID-19: Secondary | ICD-10-CM | POA: Diagnosis not present

## 2020-05-27 DIAGNOSIS — Z1152 Encounter for screening for COVID-19: Secondary | ICD-10-CM | POA: Diagnosis not present

## 2020-06-03 DIAGNOSIS — Z20822 Contact with and (suspected) exposure to covid-19: Secondary | ICD-10-CM | POA: Diagnosis not present

## 2020-06-03 DIAGNOSIS — Z1152 Encounter for screening for COVID-19: Secondary | ICD-10-CM | POA: Diagnosis not present

## 2020-06-08 DIAGNOSIS — Z1152 Encounter for screening for COVID-19: Secondary | ICD-10-CM | POA: Diagnosis not present

## 2020-06-08 DIAGNOSIS — Z20822 Contact with and (suspected) exposure to covid-19: Secondary | ICD-10-CM | POA: Diagnosis not present

## 2020-06-15 DIAGNOSIS — Z20822 Contact with and (suspected) exposure to covid-19: Secondary | ICD-10-CM | POA: Diagnosis not present

## 2020-06-15 DIAGNOSIS — Z1152 Encounter for screening for COVID-19: Secondary | ICD-10-CM | POA: Diagnosis not present

## 2020-06-22 DIAGNOSIS — Z20822 Contact with and (suspected) exposure to covid-19: Secondary | ICD-10-CM | POA: Diagnosis not present

## 2020-06-22 DIAGNOSIS — Z1152 Encounter for screening for COVID-19: Secondary | ICD-10-CM | POA: Diagnosis not present

## 2020-06-28 ENCOUNTER — Other Ambulatory Visit: Payer: Self-pay | Admitting: Family Medicine

## 2020-06-28 NOTE — Telephone Encounter (Signed)
Ok to refill??  Last office visit 02/12/2020.  Last refill 04/19/2020.

## 2020-06-29 DIAGNOSIS — Z20822 Contact with and (suspected) exposure to covid-19: Secondary | ICD-10-CM | POA: Diagnosis not present

## 2020-06-29 DIAGNOSIS — Z1152 Encounter for screening for COVID-19: Secondary | ICD-10-CM | POA: Diagnosis not present

## 2020-07-06 ENCOUNTER — Other Ambulatory Visit: Payer: Self-pay | Admitting: Family Medicine

## 2020-07-06 DIAGNOSIS — Z1152 Encounter for screening for COVID-19: Secondary | ICD-10-CM | POA: Diagnosis not present

## 2020-07-06 DIAGNOSIS — Z20822 Contact with and (suspected) exposure to covid-19: Secondary | ICD-10-CM | POA: Diagnosis not present

## 2020-07-07 NOTE — Telephone Encounter (Signed)
Ok to refill??  Last office visit 02/12/2020.  Last refill 03/15/2020.

## 2020-07-08 DIAGNOSIS — Z20822 Contact with and (suspected) exposure to covid-19: Secondary | ICD-10-CM | POA: Diagnosis not present

## 2020-07-08 DIAGNOSIS — Z1152 Encounter for screening for COVID-19: Secondary | ICD-10-CM | POA: Diagnosis not present

## 2020-07-13 DIAGNOSIS — Z20822 Contact with and (suspected) exposure to covid-19: Secondary | ICD-10-CM | POA: Diagnosis not present

## 2020-07-13 DIAGNOSIS — Z1152 Encounter for screening for COVID-19: Secondary | ICD-10-CM | POA: Diagnosis not present

## 2020-07-14 ENCOUNTER — Other Ambulatory Visit: Payer: Self-pay | Admitting: Family Medicine

## 2020-07-20 DIAGNOSIS — Z1152 Encounter for screening for COVID-19: Secondary | ICD-10-CM | POA: Diagnosis not present

## 2020-07-20 DIAGNOSIS — Z20822 Contact with and (suspected) exposure to covid-19: Secondary | ICD-10-CM | POA: Diagnosis not present

## 2020-07-27 DIAGNOSIS — Z20822 Contact with and (suspected) exposure to covid-19: Secondary | ICD-10-CM | POA: Diagnosis not present

## 2020-07-27 DIAGNOSIS — Z1152 Encounter for screening for COVID-19: Secondary | ICD-10-CM | POA: Diagnosis not present

## 2020-08-03 DIAGNOSIS — Z20822 Contact with and (suspected) exposure to covid-19: Secondary | ICD-10-CM | POA: Diagnosis not present

## 2020-08-17 DIAGNOSIS — Z20822 Contact with and (suspected) exposure to covid-19: Secondary | ICD-10-CM | POA: Diagnosis not present

## 2020-08-24 DIAGNOSIS — Z20822 Contact with and (suspected) exposure to covid-19: Secondary | ICD-10-CM | POA: Diagnosis not present

## 2020-08-31 DIAGNOSIS — Z20822 Contact with and (suspected) exposure to covid-19: Secondary | ICD-10-CM | POA: Diagnosis not present

## 2020-09-02 DIAGNOSIS — Z20822 Contact with and (suspected) exposure to covid-19: Secondary | ICD-10-CM | POA: Diagnosis not present

## 2020-09-07 DIAGNOSIS — Z20822 Contact with and (suspected) exposure to covid-19: Secondary | ICD-10-CM | POA: Diagnosis not present

## 2020-09-14 DIAGNOSIS — Z20822 Contact with and (suspected) exposure to covid-19: Secondary | ICD-10-CM | POA: Diagnosis not present

## 2020-09-21 DIAGNOSIS — Z20822 Contact with and (suspected) exposure to covid-19: Secondary | ICD-10-CM | POA: Diagnosis not present

## 2020-09-28 DIAGNOSIS — Z20822 Contact with and (suspected) exposure to covid-19: Secondary | ICD-10-CM | POA: Diagnosis not present

## 2020-09-30 DIAGNOSIS — Z20822 Contact with and (suspected) exposure to covid-19: Secondary | ICD-10-CM | POA: Diagnosis not present

## 2020-10-05 DIAGNOSIS — Z20822 Contact with and (suspected) exposure to covid-19: Secondary | ICD-10-CM | POA: Diagnosis not present

## 2020-10-07 DIAGNOSIS — Z20822 Contact with and (suspected) exposure to covid-19: Secondary | ICD-10-CM | POA: Diagnosis not present

## 2020-10-20 ENCOUNTER — Other Ambulatory Visit: Payer: Self-pay | Admitting: *Deleted

## 2020-10-20 ENCOUNTER — Other Ambulatory Visit: Payer: Self-pay | Admitting: Family Medicine

## 2020-10-20 MED ORDER — ESCITALOPRAM OXALATE 10 MG PO TABS: 10.0000 mg | ORAL_TABLET | Freq: Every day | ORAL | 2 refills | Status: AC

## 2020-10-20 NOTE — Telephone Encounter (Signed)
Ok to refill??  Last office visit 02/12/2020.  Last refill 07/07/2020.

## 2020-10-20 NOTE — Telephone Encounter (Signed)
Received call from [place], [place] with Hospice.   Requested refill on Robitussin AC.   Ok to refill?

## 2020-10-24 ENCOUNTER — Other Ambulatory Visit: Payer: Self-pay | Admitting: Family Medicine

## 2020-10-24 MED ORDER — GUAIFENESIN-CODEINE 100-10 MG/5ML PO SOLN: 5.0000 mL | ORAL | 0 refills | Status: AC | PRN

## 2020-10-24 NOTE — Telephone Encounter (Signed)
Yes, I went ahead and sent it in.

## 2020-10-27 ENCOUNTER — Other Ambulatory Visit: Payer: Self-pay | Admitting: Family Medicine

## 2020-11-25 DIAGNOSIS — Z20822 Contact with and (suspected) exposure to covid-19: Secondary | ICD-10-CM | POA: Diagnosis not present

## 2020-12-02 DIAGNOSIS — Z20822 Contact with and (suspected) exposure to covid-19: Secondary | ICD-10-CM | POA: Diagnosis not present

## 2020-12-07 DIAGNOSIS — Z20822 Contact with and (suspected) exposure to covid-19: Secondary | ICD-10-CM | POA: Diagnosis not present

## 2020-12-09 DIAGNOSIS — Z20822 Contact with and (suspected) exposure to covid-19: Secondary | ICD-10-CM | POA: Diagnosis not present

## 2020-12-14 DIAGNOSIS — Z20822 Contact with and (suspected) exposure to covid-19: Secondary | ICD-10-CM | POA: Diagnosis not present

## 2020-12-16 DIAGNOSIS — Z20822 Contact with and (suspected) exposure to covid-19: Secondary | ICD-10-CM | POA: Diagnosis not present

## 2020-12-20 DEATH — deceased
